# Patient Record
Sex: Female | Born: 1955 | Race: Black or African American | Hispanic: No | Marital: Single | Smoking: Current every day smoker
Health system: Southern US, Community
[De-identification: ages and names within clinical notes are randomized; demographics above are authoritative.]

## PROBLEM LIST (undated history)

## (undated) DIAGNOSIS — F419 Anxiety disorder, unspecified: Secondary | ICD-10-CM

## (undated) DIAGNOSIS — F32A Depression, unspecified: Secondary | ICD-10-CM

## (undated) DIAGNOSIS — K859 Acute pancreatitis without necrosis or infection, unspecified: Secondary | ICD-10-CM

## (undated) DIAGNOSIS — M545 Low back pain, unspecified: Secondary | ICD-10-CM

## (undated) DIAGNOSIS — IMO0002 Reserved for concepts with insufficient information to code with codable children: Secondary | ICD-10-CM

## (undated) DIAGNOSIS — E1110 Type 2 diabetes mellitus with ketoacidosis without coma: Secondary | ICD-10-CM

## (undated) DIAGNOSIS — K760 Fatty (change of) liver, not elsewhere classified: Secondary | ICD-10-CM

## (undated) DIAGNOSIS — G8929 Other chronic pain: Secondary | ICD-10-CM

## (undated) DIAGNOSIS — E785 Hyperlipidemia, unspecified: Secondary | ICD-10-CM

## (undated) DIAGNOSIS — M109 Gout, unspecified: Secondary | ICD-10-CM

## (undated) DIAGNOSIS — F329 Major depressive disorder, single episode, unspecified: Secondary | ICD-10-CM

## (undated) DIAGNOSIS — R109 Unspecified abdominal pain: Secondary | ICD-10-CM

## (undated) DIAGNOSIS — I639 Cerebral infarction, unspecified: Secondary | ICD-10-CM

## (undated) DIAGNOSIS — N183 Chronic kidney disease, stage 3 unspecified: Secondary | ICD-10-CM

## (undated) DIAGNOSIS — M1990 Unspecified osteoarthritis, unspecified site: Secondary | ICD-10-CM

## (undated) DIAGNOSIS — Z86010 Personal history of colonic polyps: Secondary | ICD-10-CM

## (undated) DIAGNOSIS — F319 Bipolar disorder, unspecified: Secondary | ICD-10-CM

## (undated) DIAGNOSIS — I10 Essential (primary) hypertension: Secondary | ICD-10-CM

## (undated) DIAGNOSIS — E119 Type 2 diabetes mellitus without complications: Secondary | ICD-10-CM

## (undated) DIAGNOSIS — F209 Schizophrenia, unspecified: Secondary | ICD-10-CM

## (undated) DIAGNOSIS — Z8673 Personal history of transient ischemic attack (TIA), and cerebral infarction without residual deficits: Secondary | ICD-10-CM

## (undated) DIAGNOSIS — E039 Hypothyroidism, unspecified: Secondary | ICD-10-CM

## (undated) DIAGNOSIS — K2970 Gastritis, unspecified, without bleeding: Secondary | ICD-10-CM

## (undated) DIAGNOSIS — K222 Esophageal obstruction: Secondary | ICD-10-CM

## (undated) DIAGNOSIS — F1410 Cocaine abuse, uncomplicated: Secondary | ICD-10-CM

## (undated) DIAGNOSIS — J449 Chronic obstructive pulmonary disease, unspecified: Secondary | ICD-10-CM

## (undated) HISTORY — DX: Gastritis, unspecified, without bleeding: K29.70

## (undated) HISTORY — DX: Bipolar disorder, unspecified: F31.9

## (undated) HISTORY — DX: Unspecified osteoarthritis, unspecified site: M19.90

## (undated) HISTORY — DX: Personal history of colonic polyps: Z86.010

## (undated) HISTORY — DX: Esophageal obstruction: K22.2

## (undated) HISTORY — DX: Type 2 diabetes mellitus with ketoacidosis without coma: E11.10

## (undated) HISTORY — DX: Personal history of transient ischemic attack (TIA), and cerebral infarction without residual deficits: Z86.73

## (undated) HISTORY — DX: Cerebral infarction, unspecified: I63.9

## (undated) HISTORY — DX: Cocaine abuse, uncomplicated: F14.10

## (undated) HISTORY — PX: COLONOSCOPY: SHX174

## (undated) HISTORY — DX: Hyperlipidemia, unspecified: E78.5

## (undated) HISTORY — DX: Essential (primary) hypertension: I10

---

## 1988-05-28 HISTORY — PX: ABDOMINAL HYSTERECTOMY: SHX81

## 2006-06-03 LAB — HM MAMMOGRAPHY: HM Mammogram: NORMAL

## 2007-05-09 ENCOUNTER — Ambulatory Visit: Payer: Self-pay | Admitting: Internal Medicine

## 2007-05-09 LAB — CONVERTED CEMR LAB
ALT: 28 units/L (ref 0–35)
Basophils Absolute: 0 10*3/uL (ref 0.0–0.1)
CO2: 25 meq/L (ref 19–32)
Cholesterol: 236 mg/dL — ABNORMAL HIGH (ref 0–200)
Creatinine, Ser: 1.23 mg/dL — ABNORMAL HIGH (ref 0.40–1.20)
Eosinophils Absolute: 0.2 10*3/uL (ref 0.2–0.7)
Eosinophils Relative: 3 % (ref 0–5)
Glucose, Bld: 85 mg/dL (ref 70–99)
HCT: 39.7 % (ref 36.0–46.0)
Hemoglobin: 13.1 g/dL (ref 12.0–15.0)
Lymphocytes Relative: 27 % (ref 12–46)
MCHC: 33 g/dL (ref 30.0–36.0)
MCV: 88 fL (ref 78.0–100.0)
Monocytes Absolute: 1 10*3/uL (ref 0.1–1.0)
RDW: 16.7 % — ABNORMAL HIGH (ref 11.5–15.5)
Total Bilirubin: 0.3 mg/dL (ref 0.3–1.2)
Total CHOL/HDL Ratio: 3.1
Triglycerides: 159 mg/dL — ABNORMAL HIGH (ref <150)
VLDL: 32 mg/dL (ref 0–40)

## 2007-05-13 ENCOUNTER — Ambulatory Visit: Payer: Self-pay | Admitting: *Deleted

## 2007-05-23 ENCOUNTER — Emergency Department (HOSPITAL_COMMUNITY): Admission: EM | Admit: 2007-05-23 | Discharge: 2007-05-23 | Payer: Self-pay | Admitting: Emergency Medicine

## 2007-06-23 ENCOUNTER — Encounter (INDEPENDENT_AMBULATORY_CARE_PROVIDER_SITE_OTHER): Payer: Self-pay | Admitting: Family Medicine

## 2007-06-23 ENCOUNTER — Ambulatory Visit: Payer: Self-pay | Admitting: Internal Medicine

## 2007-07-01 ENCOUNTER — Inpatient Hospital Stay (HOSPITAL_COMMUNITY): Admission: EM | Admit: 2007-07-01 | Discharge: 2007-07-04 | Payer: Self-pay | Admitting: Emergency Medicine

## 2007-07-04 ENCOUNTER — Inpatient Hospital Stay (HOSPITAL_COMMUNITY): Admission: AD | Admit: 2007-07-04 | Discharge: 2007-07-09 | Payer: Self-pay | Admitting: Psychiatry

## 2007-07-04 ENCOUNTER — Ambulatory Visit: Payer: Self-pay | Admitting: Psychiatry

## 2007-08-10 ENCOUNTER — Emergency Department (HOSPITAL_COMMUNITY): Admission: EM | Admit: 2007-08-10 | Discharge: 2007-08-10 | Payer: Self-pay | Admitting: Emergency Medicine

## 2007-08-11 ENCOUNTER — Inpatient Hospital Stay (HOSPITAL_COMMUNITY): Admission: AD | Admit: 2007-08-11 | Discharge: 2007-08-13 | Payer: Self-pay | Admitting: Psychiatry

## 2007-08-11 ENCOUNTER — Ambulatory Visit: Payer: Self-pay | Admitting: Psychiatry

## 2007-08-22 ENCOUNTER — Ambulatory Visit: Payer: Self-pay | Admitting: Family Medicine

## 2007-08-22 ENCOUNTER — Encounter (INDEPENDENT_AMBULATORY_CARE_PROVIDER_SITE_OTHER): Payer: Self-pay | Admitting: Internal Medicine

## 2007-08-22 LAB — CONVERTED CEMR LAB
CO2: 25 meq/L (ref 19–32)
Cholesterol: 142 mg/dL (ref 0–200)
Creatinine, Ser: 0.93 mg/dL (ref 0.40–1.20)
Glucose, Bld: 68 mg/dL — ABNORMAL LOW (ref 70–99)
HDL: 49 mg/dL (ref >39)
Total Bilirubin: 0.3 mg/dL (ref 0.3–1.2)
Total CHOL/HDL Ratio: 2.9
Triglycerides: 151 mg/dL — ABNORMAL HIGH (ref <150)
VLDL: 30 mg/dL (ref 0–40)

## 2007-09-09 ENCOUNTER — Ambulatory Visit (HOSPITAL_COMMUNITY): Admission: RE | Admit: 2007-09-09 | Discharge: 2007-09-09 | Payer: Self-pay | Admitting: Family Medicine

## 2007-09-11 ENCOUNTER — Ambulatory Visit: Payer: Self-pay | Admitting: Internal Medicine

## 2007-09-22 ENCOUNTER — Ambulatory Visit: Payer: Self-pay | Admitting: Internal Medicine

## 2007-09-29 ENCOUNTER — Ambulatory Visit: Payer: Self-pay | Admitting: Internal Medicine

## 2007-12-25 ENCOUNTER — Ambulatory Visit: Payer: Self-pay | Admitting: Internal Medicine

## 2008-01-26 ENCOUNTER — Ambulatory Visit: Payer: Self-pay | Admitting: Internal Medicine

## 2008-02-04 ENCOUNTER — Ambulatory Visit: Payer: Self-pay | Admitting: Internal Medicine

## 2008-02-04 ENCOUNTER — Encounter: Payer: Self-pay | Admitting: Family Medicine

## 2008-02-04 LAB — CONVERTED CEMR LAB
AST: 36 units/L (ref 0–37)
Alkaline Phosphatase: 83 units/L (ref 39–117)
BUN: 21 mg/dL (ref 6–23)
Calcium: 9.9 mg/dL (ref 8.4–10.5)
Creatinine, Ser: 1.62 mg/dL — ABNORMAL HIGH (ref 0.40–1.20)
HDL: 62 mg/dL (ref >39)
LDL Cholesterol: 57 mg/dL (ref 0–99)
TSH: 45.567 microintl units/mL — ABNORMAL HIGH (ref 0.350–4.50)
Total CHOL/HDL Ratio: 2.4

## 2008-03-01 ENCOUNTER — Ambulatory Visit: Payer: Self-pay | Admitting: Internal Medicine

## 2008-03-05 ENCOUNTER — Encounter: Payer: Self-pay | Admitting: Family Medicine

## 2008-03-05 ENCOUNTER — Ambulatory Visit: Payer: Self-pay | Admitting: Internal Medicine

## 2008-03-05 LAB — CONVERTED CEMR LAB
ALT: 17 units/L (ref 0–35)
CO2: 24 meq/L (ref 19–32)
Creatinine, Ser: 1.23 mg/dL — ABNORMAL HIGH (ref 0.40–1.20)
TSH: 23.667 microintl units/mL — ABNORMAL HIGH (ref 0.350–4.50)
Total Bilirubin: 0.4 mg/dL (ref 0.3–1.2)

## 2008-04-09 ENCOUNTER — Ambulatory Visit: Payer: Self-pay | Admitting: Family Medicine

## 2008-05-05 ENCOUNTER — Emergency Department (HOSPITAL_COMMUNITY): Admission: EM | Admit: 2008-05-05 | Discharge: 2008-05-05 | Payer: Self-pay | Admitting: Emergency Medicine

## 2008-05-06 ENCOUNTER — Emergency Department (HOSPITAL_COMMUNITY): Admission: EM | Admit: 2008-05-06 | Discharge: 2008-05-06 | Payer: Self-pay | Admitting: Emergency Medicine

## 2008-05-14 ENCOUNTER — Ambulatory Visit: Payer: Self-pay | Admitting: Internal Medicine

## 2008-06-22 ENCOUNTER — Emergency Department (HOSPITAL_COMMUNITY): Admission: EM | Admit: 2008-06-22 | Discharge: 2008-06-22 | Payer: Self-pay | Admitting: Emergency Medicine

## 2008-07-06 ENCOUNTER — Emergency Department (HOSPITAL_COMMUNITY): Admission: EM | Admit: 2008-07-06 | Discharge: 2008-07-06 | Payer: Self-pay | Admitting: Emergency Medicine

## 2008-08-25 ENCOUNTER — Emergency Department (HOSPITAL_COMMUNITY): Admission: EM | Admit: 2008-08-25 | Discharge: 2008-08-25 | Payer: Self-pay | Admitting: Emergency Medicine

## 2008-08-25 ENCOUNTER — Inpatient Hospital Stay (HOSPITAL_COMMUNITY): Admission: EM | Admit: 2008-08-25 | Discharge: 2008-08-28 | Payer: Self-pay | Admitting: Emergency Medicine

## 2008-08-26 ENCOUNTER — Encounter (INDEPENDENT_AMBULATORY_CARE_PROVIDER_SITE_OTHER): Payer: Self-pay | Admitting: Internal Medicine

## 2008-09-03 ENCOUNTER — Ambulatory Visit: Payer: Self-pay | Admitting: Cardiology

## 2008-09-03 ENCOUNTER — Inpatient Hospital Stay (HOSPITAL_COMMUNITY): Admission: EM | Admit: 2008-09-03 | Discharge: 2008-09-06 | Payer: Self-pay | Admitting: Emergency Medicine

## 2009-01-10 ENCOUNTER — Ambulatory Visit: Payer: Self-pay | Admitting: Family Medicine

## 2009-01-10 ENCOUNTER — Encounter: Payer: Self-pay | Admitting: Family Medicine

## 2009-01-11 ENCOUNTER — Ambulatory Visit (HOSPITAL_COMMUNITY): Admission: RE | Admit: 2009-01-11 | Discharge: 2009-01-11 | Payer: Self-pay | Admitting: Family Medicine

## 2009-03-11 ENCOUNTER — Emergency Department (HOSPITAL_COMMUNITY): Admission: EM | Admit: 2009-03-11 | Discharge: 2009-03-11 | Payer: Self-pay | Admitting: Emergency Medicine

## 2009-07-21 ENCOUNTER — Ambulatory Visit: Payer: Self-pay | Admitting: Internal Medicine

## 2009-08-01 ENCOUNTER — Emergency Department (HOSPITAL_COMMUNITY): Admission: EM | Admit: 2009-08-01 | Discharge: 2009-08-01 | Payer: Self-pay | Admitting: Emergency Medicine

## 2009-08-08 ENCOUNTER — Emergency Department (HOSPITAL_COMMUNITY): Admission: EM | Admit: 2009-08-08 | Discharge: 2009-08-08 | Payer: Self-pay | Admitting: Emergency Medicine

## 2009-09-27 ENCOUNTER — Ambulatory Visit: Payer: Self-pay | Admitting: Internal Medicine

## 2009-09-27 LAB — CONVERTED CEMR LAB
ALT: 17 units/L (ref 0–35)
Albumin: 4.9 g/dL (ref 3.5–5.2)
CO2: 22 meq/L (ref 19–32)
CRP: 0.3 mg/dL (ref <0.6)
Chloride: 101 meq/L (ref 96–112)
Cholesterol: 246 mg/dL — ABNORMAL HIGH (ref 0–200)
Glucose, Bld: 104 mg/dL — ABNORMAL HIGH (ref 70–99)
Hemoglobin: 13.2 g/dL (ref 12.0–15.0)
Hgb A1c MFr Bld: 6.5 % — ABNORMAL HIGH (ref <5.7)
Lymphocytes Relative: 31 % (ref 12–46)
Lymphs Abs: 1.7 10*3/uL (ref 0.7–4.0)
MCHC: 32.8 g/dL (ref 30.0–36.0)
Monocytes Absolute: 0.5 10*3/uL (ref 0.1–1.0)
Monocytes Relative: 9 % (ref 3–12)
Neutro Abs: 3.2 10*3/uL (ref 1.7–7.7)
Potassium: 3.6 meq/L (ref 3.5–5.3)
Pro B Natriuretic peptide (BNP): 22 pg/mL (ref 0.0–100.0)
RBC: 4.5 M/uL (ref 3.87–5.11)
Sodium: 139 meq/L (ref 135–145)
Total Protein: 7.8 g/dL (ref 6.0–8.3)
Triglycerides: 160 mg/dL — ABNORMAL HIGH (ref <150)
Valproic Acid Lvl: <1 ug/mL — ABNORMAL LOW (ref 50.0–100.0)
WBC: 5.5 10*3/uL (ref 4.0–10.5)

## 2010-01-25 ENCOUNTER — Ambulatory Visit: Payer: Self-pay | Admitting: Internal Medicine

## 2010-04-26 ENCOUNTER — Emergency Department (HOSPITAL_COMMUNITY)
Admission: EM | Admit: 2010-04-26 | Discharge: 2010-04-26 | Payer: Self-pay | Source: Home / Self Care | Admitting: Family Medicine

## 2010-05-05 ENCOUNTER — Emergency Department (HOSPITAL_COMMUNITY)
Admission: EM | Admit: 2010-05-05 | Discharge: 2010-05-05 | Payer: Self-pay | Source: Home / Self Care | Admitting: Emergency Medicine

## 2010-05-09 ENCOUNTER — Emergency Department (HOSPITAL_COMMUNITY)
Admission: EM | Admit: 2010-05-09 | Discharge: 2010-05-09 | Payer: Self-pay | Source: Home / Self Care | Admitting: Emergency Medicine

## 2010-05-10 ENCOUNTER — Emergency Department (HOSPITAL_COMMUNITY)
Admission: EM | Admit: 2010-05-10 | Discharge: 2010-05-10 | Payer: Self-pay | Source: Home / Self Care | Admitting: Emergency Medicine

## 2010-05-11 ENCOUNTER — Emergency Department (HOSPITAL_COMMUNITY)
Admission: EM | Admit: 2010-05-11 | Discharge: 2010-05-11 | Payer: Self-pay | Source: Home / Self Care | Admitting: Emergency Medicine

## 2010-05-11 ENCOUNTER — Ambulatory Visit: Payer: Self-pay | Admitting: Family Medicine

## 2010-05-11 ENCOUNTER — Emergency Department (HOSPITAL_COMMUNITY): Admission: EM | Admit: 2010-05-11 | Payer: Self-pay | Source: Home / Self Care | Admitting: Emergency Medicine

## 2010-05-26 ENCOUNTER — Emergency Department (HOSPITAL_COMMUNITY)
Admission: EM | Admit: 2010-05-26 | Discharge: 2010-05-26 | Payer: Self-pay | Source: Home / Self Care | Admitting: Family Medicine

## 2010-07-07 ENCOUNTER — Emergency Department (HOSPITAL_COMMUNITY)
Admission: EM | Admit: 2010-07-07 | Discharge: 2010-07-07 | Disposition: A | Discharge status: Home / Self Care | Payer: Self-pay | Attending: Emergency Medicine | Admitting: Emergency Medicine

## 2010-07-07 ENCOUNTER — Emergency Department (HOSPITAL_COMMUNITY): Payer: Self-pay

## 2010-07-07 DIAGNOSIS — M542 Cervicalgia: Secondary | ICD-10-CM | POA: Insufficient documentation

## 2010-07-07 DIAGNOSIS — M545 Low back pain, unspecified: Secondary | ICD-10-CM | POA: Insufficient documentation

## 2010-07-07 DIAGNOSIS — Y92009 Unspecified place in unspecified non-institutional (private) residence as the place of occurrence of the external cause: Secondary | ICD-10-CM | POA: Insufficient documentation

## 2010-07-07 DIAGNOSIS — E119 Type 2 diabetes mellitus without complications: Secondary | ICD-10-CM | POA: Insufficient documentation

## 2010-07-07 DIAGNOSIS — W108XXA Fall (on) (from) other stairs and steps, initial encounter: Secondary | ICD-10-CM | POA: Insufficient documentation

## 2010-07-07 DIAGNOSIS — E7800 Pure hypercholesterolemia, unspecified: Secondary | ICD-10-CM | POA: Insufficient documentation

## 2010-07-07 DIAGNOSIS — E039 Hypothyroidism, unspecified: Secondary | ICD-10-CM | POA: Insufficient documentation

## 2010-07-19 ENCOUNTER — Emergency Department (HOSPITAL_COMMUNITY): Payer: Self-pay

## 2010-07-19 ENCOUNTER — Emergency Department (HOSPITAL_COMMUNITY)
Admission: EM | Admit: 2010-07-19 | Discharge: 2010-07-19 | Disposition: A | Discharge status: Home / Self Care | Payer: Self-pay | Attending: Emergency Medicine | Admitting: Emergency Medicine

## 2010-07-19 DIAGNOSIS — I10 Essential (primary) hypertension: Secondary | ICD-10-CM | POA: Insufficient documentation

## 2010-07-19 DIAGNOSIS — M549 Dorsalgia, unspecified: Secondary | ICD-10-CM | POA: Insufficient documentation

## 2010-07-19 DIAGNOSIS — E119 Type 2 diabetes mellitus without complications: Secondary | ICD-10-CM | POA: Insufficient documentation

## 2010-07-19 DIAGNOSIS — E7800 Pure hypercholesterolemia, unspecified: Secondary | ICD-10-CM | POA: Insufficient documentation

## 2010-07-19 DIAGNOSIS — R12 Heartburn: Secondary | ICD-10-CM | POA: Insufficient documentation

## 2010-07-19 DIAGNOSIS — E039 Hypothyroidism, unspecified: Secondary | ICD-10-CM | POA: Insufficient documentation

## 2010-07-19 DIAGNOSIS — R112 Nausea with vomiting, unspecified: Secondary | ICD-10-CM | POA: Insufficient documentation

## 2010-07-19 DIAGNOSIS — R1013 Epigastric pain: Secondary | ICD-10-CM | POA: Insufficient documentation

## 2010-07-19 LAB — DIFFERENTIAL
Basophils Relative: 0 % (ref 0–1)
Lymphs Abs: 2.7 10*3/uL (ref 0.7–4.0)
Monocytes Absolute: 0.6 10*3/uL (ref 0.1–1.0)
Monocytes Relative: 8 % (ref 3–12)
Neutro Abs: 4 10*3/uL (ref 1.7–7.7)
Neutrophils Relative %: 54 % (ref 43–77)

## 2010-07-19 LAB — CBC
HCT: 37.6 % (ref 36.0–46.0)
Hemoglobin: 12.8 g/dL (ref 12.0–15.0)
MCH: 28.9 pg (ref 26.0–34.0)
MCHC: 34 g/dL (ref 30.0–36.0)
MCV: 84.9 fL (ref 78.0–100.0)
RBC: 4.43 MIL/uL (ref 3.87–5.11)

## 2010-07-19 LAB — URINALYSIS, ROUTINE W REFLEX MICROSCOPIC
Bilirubin Urine: NEGATIVE
Hgb urine dipstick: NEGATIVE
Specific Gravity, Urine: 1.023 (ref 1.005–1.030)
Urine Glucose, Fasting: NEGATIVE mg/dL
Urobilinogen, UA: 1 mg/dL (ref 0.0–1.0)

## 2010-07-19 LAB — COMPREHENSIVE METABOLIC PANEL
AST: 36 U/L (ref 0–37)
BUN: 17 mg/dL (ref 6–23)
CO2: 25 mEq/L (ref 19–32)
Calcium: 9.2 mg/dL (ref 8.4–10.5)
Chloride: 105 mEq/L (ref 96–112)
Creatinine, Ser: 1.55 mg/dL — ABNORMAL HIGH (ref 0.4–1.2)
GFR calc Af Amer: 42 mL/min — ABNORMAL LOW (ref >60)
GFR calc non Af Amer: 35 mL/min — ABNORMAL LOW (ref >60)
Glucose, Bld: 91 mg/dL (ref 70–99)
Total Bilirubin: 0.3 mg/dL (ref 0.3–1.2)

## 2010-07-19 LAB — LIPASE, BLOOD: Lipase: 119 U/L — ABNORMAL HIGH (ref 11–59)

## 2010-07-24 ENCOUNTER — Emergency Department (HOSPITAL_COMMUNITY)
Admission: EM | Admit: 2010-07-24 | Discharge: 2010-07-24 | Disposition: A | Discharge status: Home / Self Care | Payer: Self-pay | Attending: Emergency Medicine | Admitting: Emergency Medicine

## 2010-07-24 DIAGNOSIS — E039 Hypothyroidism, unspecified: Secondary | ICD-10-CM | POA: Insufficient documentation

## 2010-07-24 DIAGNOSIS — I10 Essential (primary) hypertension: Secondary | ICD-10-CM | POA: Insufficient documentation

## 2010-07-24 DIAGNOSIS — F319 Bipolar disorder, unspecified: Secondary | ICD-10-CM | POA: Insufficient documentation

## 2010-07-24 DIAGNOSIS — R1013 Epigastric pain: Secondary | ICD-10-CM | POA: Insufficient documentation

## 2010-07-24 DIAGNOSIS — K219 Gastro-esophageal reflux disease without esophagitis: Secondary | ICD-10-CM | POA: Insufficient documentation

## 2010-07-24 DIAGNOSIS — Z79899 Other long term (current) drug therapy: Secondary | ICD-10-CM | POA: Insufficient documentation

## 2010-07-24 DIAGNOSIS — E119 Type 2 diabetes mellitus without complications: Secondary | ICD-10-CM | POA: Insufficient documentation

## 2010-07-24 DIAGNOSIS — E789 Disorder of lipoprotein metabolism, unspecified: Secondary | ICD-10-CM | POA: Insufficient documentation

## 2010-07-24 LAB — CBC
Hemoglobin: 14.1 g/dL (ref 12.0–15.0)
MCH: 29.5 pg (ref 26.0–34.0)
Platelets: 308 10*3/uL (ref 150–400)
RBC: 4.78 MIL/uL (ref 3.87–5.11)
WBC: 7.7 10*3/uL (ref 4.0–10.5)

## 2010-07-24 LAB — COMPREHENSIVE METABOLIC PANEL
ALT: 12 U/L (ref 0–35)
AST: 26 U/L (ref 0–37)
Albumin: 4.6 g/dL (ref 3.5–5.2)
CO2: 27 mEq/L (ref 19–32)
Chloride: 100 mEq/L (ref 96–112)
Creatinine, Ser: 1.6 mg/dL — ABNORMAL HIGH (ref 0.4–1.2)
GFR calc Af Amer: 41 mL/min — ABNORMAL LOW (ref >60)
GFR calc non Af Amer: 34 mL/min — ABNORMAL LOW (ref >60)
Sodium: 135 mEq/L (ref 135–145)
Total Bilirubin: 0.7 mg/dL (ref 0.3–1.2)

## 2010-08-14 ENCOUNTER — Emergency Department (HOSPITAL_COMMUNITY): Payer: Self-pay

## 2010-08-14 ENCOUNTER — Emergency Department (HOSPITAL_COMMUNITY)
Admission: EM | Admit: 2010-08-14 | Discharge: 2010-08-14 | Disposition: A | Discharge status: Home / Self Care | Payer: Self-pay | Attending: Emergency Medicine | Admitting: Emergency Medicine

## 2010-08-14 DIAGNOSIS — J45909 Unspecified asthma, uncomplicated: Secondary | ICD-10-CM | POA: Insufficient documentation

## 2010-08-14 DIAGNOSIS — IMO0002 Reserved for concepts with insufficient information to code with codable children: Secondary | ICD-10-CM | POA: Insufficient documentation

## 2010-08-14 DIAGNOSIS — R0602 Shortness of breath: Secondary | ICD-10-CM | POA: Insufficient documentation

## 2010-08-14 DIAGNOSIS — R0989 Other specified symptoms and signs involving the circulatory and respiratory systems: Secondary | ICD-10-CM | POA: Insufficient documentation

## 2010-08-14 DIAGNOSIS — E039 Hypothyroidism, unspecified: Secondary | ICD-10-CM | POA: Insufficient documentation

## 2010-08-14 DIAGNOSIS — R0609 Other forms of dyspnea: Secondary | ICD-10-CM | POA: Insufficient documentation

## 2010-08-14 DIAGNOSIS — I10 Essential (primary) hypertension: Secondary | ICD-10-CM | POA: Insufficient documentation

## 2010-08-14 DIAGNOSIS — F319 Bipolar disorder, unspecified: Secondary | ICD-10-CM | POA: Insufficient documentation

## 2010-08-14 DIAGNOSIS — E119 Type 2 diabetes mellitus without complications: Secondary | ICD-10-CM | POA: Insufficient documentation

## 2010-08-14 DIAGNOSIS — M545 Low back pain, unspecified: Secondary | ICD-10-CM | POA: Insufficient documentation

## 2010-08-14 DIAGNOSIS — Z79899 Other long term (current) drug therapy: Secondary | ICD-10-CM | POA: Insufficient documentation

## 2010-08-14 DIAGNOSIS — G8929 Other chronic pain: Secondary | ICD-10-CM | POA: Insufficient documentation

## 2010-08-14 DIAGNOSIS — K219 Gastro-esophageal reflux disease without esophagitis: Secondary | ICD-10-CM | POA: Insufficient documentation

## 2010-08-20 LAB — CBC
HCT: 38.9 % (ref 36.0–46.0)
MCHC: 32.6 g/dL (ref 30.0–36.0)
MCV: 90.9 fL (ref 78.0–100.0)
Platelets: 252 10*3/uL (ref 150–400)
RBC: 4.28 MIL/uL (ref 3.87–5.11)
WBC: 7.7 10*3/uL (ref 4.0–10.5)

## 2010-08-30 ENCOUNTER — Ambulatory Visit (HOSPITAL_COMMUNITY)
Admission: RE | Admit: 2010-08-30 | Discharge: 2010-08-30 | Disposition: A | Discharge status: Home / Self Care | Payer: Self-pay | Source: Ambulatory Visit | Attending: Family Medicine | Admitting: Family Medicine

## 2010-08-30 ENCOUNTER — Emergency Department (HOSPITAL_COMMUNITY)
Admission: EM | Admit: 2010-08-30 | Discharge: 2010-08-30 | Disposition: A | Discharge status: Home / Self Care | Payer: Self-pay | Attending: Emergency Medicine | Admitting: Emergency Medicine

## 2010-08-30 DIAGNOSIS — M549 Dorsalgia, unspecified: Secondary | ICD-10-CM | POA: Insufficient documentation

## 2010-08-30 DIAGNOSIS — R0602 Shortness of breath: Secondary | ICD-10-CM | POA: Insufficient documentation

## 2010-08-30 DIAGNOSIS — E7800 Pure hypercholesterolemia, unspecified: Secondary | ICD-10-CM | POA: Insufficient documentation

## 2010-08-30 DIAGNOSIS — F1410 Cocaine abuse, uncomplicated: Secondary | ICD-10-CM | POA: Insufficient documentation

## 2010-08-30 DIAGNOSIS — F319 Bipolar disorder, unspecified: Secondary | ICD-10-CM | POA: Insufficient documentation

## 2010-08-30 DIAGNOSIS — R1013 Epigastric pain: Secondary | ICD-10-CM | POA: Insufficient documentation

## 2010-08-30 DIAGNOSIS — J45909 Unspecified asthma, uncomplicated: Secondary | ICD-10-CM | POA: Insufficient documentation

## 2010-08-30 DIAGNOSIS — E039 Hypothyroidism, unspecified: Secondary | ICD-10-CM | POA: Insufficient documentation

## 2010-08-30 DIAGNOSIS — K219 Gastro-esophageal reflux disease without esophagitis: Secondary | ICD-10-CM | POA: Insufficient documentation

## 2010-08-30 DIAGNOSIS — S01119A Laceration without foreign body of unspecified eyelid and periocular area, initial encounter: Secondary | ICD-10-CM | POA: Insufficient documentation

## 2010-08-30 DIAGNOSIS — I10 Essential (primary) hypertension: Secondary | ICD-10-CM | POA: Insufficient documentation

## 2010-08-30 DIAGNOSIS — E119 Type 2 diabetes mellitus without complications: Secondary | ICD-10-CM | POA: Insufficient documentation

## 2010-08-30 LAB — RAPID URINE DRUG SCREEN, HOSP PERFORMED
Barbiturates: NOT DETECTED
Benzodiazepines: POSITIVE — AB
Opiates: POSITIVE — AB

## 2010-08-30 LAB — URINALYSIS, ROUTINE W REFLEX MICROSCOPIC
Glucose, UA: NEGATIVE mg/dL
Protein, ur: NEGATIVE mg/dL
Specific Gravity, Urine: 1.031 — ABNORMAL HIGH (ref 1.005–1.030)
pH: 6 (ref 5.0–8.0)

## 2010-08-30 LAB — DIFFERENTIAL
Basophils Relative: 0 % (ref 0–1)
Eosinophils Absolute: 0.1 10*3/uL (ref 0.0–0.7)
Eosinophils Relative: 1 % (ref 0–5)
Lymphs Abs: 2.1 10*3/uL (ref 0.7–4.0)
Monocytes Relative: 7 % (ref 3–12)

## 2010-08-30 LAB — COMPREHENSIVE METABOLIC PANEL
Albumin: 4.1 g/dL (ref 3.5–5.2)
BUN: 12 mg/dL (ref 6–23)
Calcium: 9.2 mg/dL (ref 8.4–10.5)
Creatinine, Ser: 1.47 mg/dL — ABNORMAL HIGH (ref 0.4–1.2)
GFR calc Af Amer: 45 mL/min — ABNORMAL LOW (ref >60)
Total Bilirubin: 0.3 mg/dL (ref 0.3–1.2)
Total Protein: 7.2 g/dL (ref 6.0–8.3)

## 2010-08-30 LAB — CBC
MCH: 29.1 pg (ref 26.0–34.0)
MCHC: 34.1 g/dL (ref 30.0–36.0)
MCV: 85.3 fL (ref 78.0–100.0)
Platelets: 272 10*3/uL (ref 150–400)
RDW: 16.6 % — ABNORMAL HIGH (ref 11.5–15.5)

## 2010-09-06 LAB — CBC
HCT: 40.3 % (ref 36.0–46.0)
Hemoglobin: 13.5 g/dL (ref 12.0–15.0)
MCHC: 33.8 g/dL (ref 30.0–36.0)
MCV: 88.5 fL (ref 78.0–100.0)
MCV: 89.2 fL (ref 78.0–100.0)
Platelets: 239 10*3/uL (ref 150–400)
RBC: 3.94 MIL/uL (ref 3.87–5.11)
RBC: 4.1 MIL/uL (ref 3.87–5.11)
RBC: 4.53 MIL/uL (ref 3.87–5.11)
RDW: 19.5 % — ABNORMAL HIGH (ref 11.5–15.5)
RDW: 19.8 % — ABNORMAL HIGH (ref 11.5–15.5)
WBC: 5.7 10*3/uL (ref 4.0–10.5)
WBC: 7.9 10*3/uL (ref 4.0–10.5)
WBC: 8.2 10*3/uL (ref 4.0–10.5)

## 2010-09-06 LAB — RAPID URINE DRUG SCREEN, HOSP PERFORMED
Amphetamines: NOT DETECTED
Amphetamines: NOT DETECTED
Benzodiazepines: NOT DETECTED
Cocaine: POSITIVE — AB
Cocaine: POSITIVE — AB
Opiates: POSITIVE — AB
Tetrahydrocannabinol: NOT DETECTED
Tetrahydrocannabinol: NOT DETECTED

## 2010-09-06 LAB — HEPATIC FUNCTION PANEL
AST: 31 U/L (ref 0–37)
Albumin: 3.5 g/dL (ref 3.5–5.2)
Alkaline Phosphatase: 112 U/L (ref 39–117)
Bilirubin, Direct: 0.2 mg/dL (ref 0.0–0.3)
Total Bilirubin: 0.5 mg/dL (ref 0.3–1.2)

## 2010-09-06 LAB — URINALYSIS, ROUTINE W REFLEX MICROSCOPIC
Glucose, UA: NEGATIVE mg/dL
Ketones, ur: NEGATIVE mg/dL
Leukocytes, UA: NEGATIVE
Leukocytes, UA: NEGATIVE
Nitrite: NEGATIVE
Nitrite: NEGATIVE
Protein, ur: 30 mg/dL — AB
Protein, ur: 30 mg/dL — AB
Urobilinogen, UA: 1 mg/dL (ref 0.0–1.0)
pH: 6.5 (ref 5.0–8.0)

## 2010-09-06 LAB — TROPONIN I
Troponin I: 0.01 ng/mL (ref 0.00–0.06)
Troponin I: <0.01 ng/mL (ref 0.00–0.06)
Troponin I: <0.01 ng/mL (ref 0.00–0.06)
Troponin I: <0.01 ng/mL (ref 0.00–0.06)

## 2010-09-06 LAB — GLUCOSE, CAPILLARY
Glucose-Capillary: 104 mg/dL — ABNORMAL HIGH (ref 70–99)
Glucose-Capillary: 113 mg/dL — ABNORMAL HIGH (ref 70–99)
Glucose-Capillary: 119 mg/dL — ABNORMAL HIGH (ref 70–99)
Glucose-Capillary: 119 mg/dL — ABNORMAL HIGH (ref 70–99)
Glucose-Capillary: 129 mg/dL — ABNORMAL HIGH (ref 70–99)
Glucose-Capillary: 133 mg/dL — ABNORMAL HIGH (ref 70–99)
Glucose-Capillary: 145 mg/dL — ABNORMAL HIGH (ref 70–99)
Glucose-Capillary: 148 mg/dL — ABNORMAL HIGH (ref 70–99)
Glucose-Capillary: 154 mg/dL — ABNORMAL HIGH (ref 70–99)
Glucose-Capillary: 158 mg/dL — ABNORMAL HIGH (ref 70–99)
Glucose-Capillary: 163 mg/dL — ABNORMAL HIGH (ref 70–99)
Glucose-Capillary: 166 mg/dL — ABNORMAL HIGH (ref 70–99)
Glucose-Capillary: 278 mg/dL — ABNORMAL HIGH (ref 70–99)

## 2010-09-06 LAB — BASIC METABOLIC PANEL
BUN: 6 mg/dL (ref 6–23)
CO2: 24 mEq/L (ref 19–32)
CO2: 24 mEq/L (ref 19–32)
Chloride: 102 mEq/L (ref 96–112)
Chloride: 109 mEq/L (ref 96–112)
Creatinine, Ser: 1.44 mg/dL — ABNORMAL HIGH (ref 0.4–1.2)
Creatinine, Ser: 1.54 mg/dL — ABNORMAL HIGH (ref 0.4–1.2)
GFR calc Af Amer: 47 mL/min — ABNORMAL LOW (ref >60)
Glucose, Bld: 116 mg/dL — ABNORMAL HIGH (ref 70–99)
Glucose, Bld: 146 mg/dL — ABNORMAL HIGH (ref 70–99)
Potassium: 4.4 mEq/L (ref 3.5–5.1)

## 2010-09-06 LAB — URINE MICROSCOPIC-ADD ON

## 2010-09-06 LAB — DIFFERENTIAL
Eosinophils Absolute: 0.1 10*3/uL (ref 0.0–0.7)
Lymphs Abs: 2.1 10*3/uL (ref 0.7–4.0)
Neutrophils Relative %: 64 % (ref 43–77)

## 2010-09-06 LAB — CK TOTAL AND CKMB (NOT AT ARMC)
CK, MB: 5.3 ng/mL — ABNORMAL HIGH (ref 0.3–4.0)
CK, MB: 6.2 ng/mL — ABNORMAL HIGH (ref 0.3–4.0)
CK, MB: 6.7 ng/mL — ABNORMAL HIGH (ref 0.3–4.0)
Relative Index: 0.5 (ref 0.0–2.5)
Total CK: 694 U/L — ABNORMAL HIGH (ref 7–177)
Total CK: 990 U/L — ABNORMAL HIGH (ref 7–177)

## 2010-09-06 LAB — TSH: TSH: 69.845 u[IU]/mL — ABNORMAL HIGH (ref 0.350–4.500)

## 2010-09-06 LAB — POCT I-STAT, CHEM 8
Creatinine, Ser: 1.6 mg/dL — ABNORMAL HIGH (ref 0.4–1.2)
HCT: 47 % — ABNORMAL HIGH (ref 36.0–46.0)
Hemoglobin: 16 g/dL — ABNORMAL HIGH (ref 12.0–15.0)
Potassium: 3.4 mEq/L — ABNORMAL LOW (ref 3.5–5.1)
Sodium: 138 mEq/L (ref 135–145)

## 2010-09-06 LAB — POCT CARDIAC MARKERS
CKMB, poc: 2.3 ng/mL (ref 1.0–8.0)
Myoglobin, poc: 137 ng/mL (ref 12–200)

## 2010-09-06 LAB — HEMOGLOBIN A1C: Mean Plasma Glucose: 180 mg/dL

## 2010-09-06 LAB — HIV ANTIBODY (ROUTINE TESTING W REFLEX): HIV: NONREACTIVE

## 2010-09-06 LAB — CARDIAC PANEL(CRET KIN+CKTOT+MB+TROPI): Relative Index: 0.5 (ref 0.0–2.5)

## 2010-09-06 LAB — CREATININE, URINE, RANDOM: Creatinine, Urine: 364.8 mg/dL

## 2010-09-06 LAB — HEPARIN LEVEL (UNFRACTIONATED)
Heparin Unfractionated: 0.28 IU/mL — ABNORMAL LOW (ref 0.30–0.70)
Heparin Unfractionated: <0.1 IU/mL — ABNORMAL LOW (ref 0.30–0.70)

## 2010-09-07 LAB — BASIC METABOLIC PANEL
BUN: 5 mg/dL — ABNORMAL LOW (ref 6–23)
Calcium: 9.6 mg/dL (ref 8.4–10.5)
GFR calc non Af Amer: 36 mL/min — ABNORMAL LOW (ref >60)
Potassium: 2.6 mEq/L — CL (ref 3.5–5.1)
Sodium: 135 mEq/L (ref 135–145)

## 2010-09-07 LAB — CBC
HCT: 41.8 % (ref 36.0–46.0)
Hemoglobin: 14.3 g/dL (ref 12.0–15.0)
Platelets: 312 10*3/uL (ref 150–400)
WBC: 8.2 10*3/uL (ref 4.0–10.5)

## 2010-09-07 LAB — DIFFERENTIAL
Eosinophils Relative: 1 % (ref 0–5)
Lymphocytes Relative: 32 % (ref 12–46)
Lymphs Abs: 2.6 10*3/uL (ref 0.7–4.0)
Neutro Abs: 4.8 10*3/uL (ref 1.7–7.7)

## 2010-09-07 LAB — D-DIMER, QUANTITATIVE: D-Dimer, Quant: 0.24 ug/mL-FEU (ref 0.00–0.48)

## 2010-09-07 LAB — POCT CARDIAC MARKERS
CKMB, poc: 4.2 ng/mL (ref 1.0–8.0)
Myoglobin, poc: 302 ng/mL (ref 12–200)
Troponin i, poc: <0.05 ng/mL (ref 0.00–0.09)

## 2010-09-28 ENCOUNTER — Emergency Department (HOSPITAL_COMMUNITY)
Admission: EM | Admit: 2010-09-28 | Discharge: 2010-09-28 | Disposition: A | Discharge status: Home / Self Care | Payer: Self-pay | Attending: Emergency Medicine | Admitting: Emergency Medicine

## 2010-09-28 DIAGNOSIS — E7800 Pure hypercholesterolemia, unspecified: Secondary | ICD-10-CM | POA: Insufficient documentation

## 2010-09-28 DIAGNOSIS — M545 Low back pain, unspecified: Secondary | ICD-10-CM | POA: Insufficient documentation

## 2010-09-28 DIAGNOSIS — E039 Hypothyroidism, unspecified: Secondary | ICD-10-CM | POA: Insufficient documentation

## 2010-09-28 DIAGNOSIS — K219 Gastro-esophageal reflux disease without esophagitis: Secondary | ICD-10-CM | POA: Insufficient documentation

## 2010-09-28 DIAGNOSIS — I10 Essential (primary) hypertension: Secondary | ICD-10-CM | POA: Insufficient documentation

## 2010-09-28 DIAGNOSIS — Z79899 Other long term (current) drug therapy: Secondary | ICD-10-CM | POA: Insufficient documentation

## 2010-09-28 DIAGNOSIS — F319 Bipolar disorder, unspecified: Secondary | ICD-10-CM | POA: Insufficient documentation

## 2010-09-28 DIAGNOSIS — Z760 Encounter for issue of repeat prescription: Secondary | ICD-10-CM | POA: Insufficient documentation

## 2010-09-28 DIAGNOSIS — E119 Type 2 diabetes mellitus without complications: Secondary | ICD-10-CM | POA: Insufficient documentation

## 2010-09-28 DIAGNOSIS — G8929 Other chronic pain: Secondary | ICD-10-CM | POA: Insufficient documentation

## 2010-09-28 DIAGNOSIS — F17200 Nicotine dependence, unspecified, uncomplicated: Secondary | ICD-10-CM | POA: Insufficient documentation
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
## 2010-10-10 NOTE — Discharge Summary (Signed)
[name], [name]                 ACCOUNT NO.:  [id number]   MEDICAL RECORD NO.:  [id number]          PATIENT TYPE:  IPS   LOCATION:  0504                          FACILITY:  BH   PHYSICIAN:  Geoffery Lyons, M.D.      DATE OF BIRTH:  31-Oct-1955   DATE OF ADMISSION:  08/11/2007  DATE OF DISCHARGE:  08/13/2007                               DISCHARGE SUMMARY   CHIEF COMPLAINT/HISTORY OF PRESENT ILLNESS:  This was the second  admission to [facility] for this 55 year old African-  American female, voluntarily admitted.  Presented to the emergency room  reporting that she was having suicidal thoughts, wanting to overdose on  her medications.  Was evicted from her transitional house hopefully  tomorrow.  __________  out of prison, where she was for bad checks.  They evicted her because she had used cocaine and relapsed this week,  using twice.  The director of the home brought her to the emergency  room.  Feeling hopeless, having to start over again in another place.  Nowhere to go.  No family.  No support.   PAST PSYCHIATRIC HISTORY:  __________  at [facility], previously  from February 6th to the 11th after taking an intentional overdose of  __________  medications.  Cocaine dependence with a history of 4 years  of abstinence.  She had been diagnosed with bipolar disorder about 5 to  6 years ago.  Has a history of prior hospitalization at the [facility] in 2005.   MEDICAL HISTORY:  Hyperlipidemia, hypertension, asthma, hypothyroidism.   MEDICATIONS:  1. Glucotrol XL 5 mg per day.  2. Avandia 2 mg twice a day.  3. Albuterol inhaler 2 puffs every 4 hours as needed for asthma.  4. Invega 3 mg per day.  5. Lipitor 40 mg per day.  6. Trazodone 100 mg per day.  7. Depakote 1000 at night.  8. Percocet 5/325 one tablet every six hours as needed for pain.  9. Lisinopril 5 mg per day.  10.Prozac 20 mg per day.  11.Levothyroxine 100 mcg per day.  12.Hydrochlorothiazide 25 mg per day.  13.Protonix 40 mg per day.  14.Ambien 10 at bedtime for sleep.  15.Lantus insulin 20 units nightly.   PHYSICAL EXAMINATION:  Failed to show any acute findings.   LABORATORY WORK:  TSH previously 107.34.  CBC:  White blood cell 8.2,  hemoglobin 12.4, sodium 137, potassium 3.4, BUN 16, creatinine 1.2,  glucose 79, SGOT 31, SGPT 19, total bilirubin 0.4.  Depakote level 33.1.  UDS positive for cocaine.   MENTAL STATUS EXAM:  This is a fully alert cooperative female, blunted  affect, feeling very hopeless.  Upset because she was evicted from the  home because she used drugs.  She regrets her relapse.  She is now  homeless.  Was able to get to work easily living at this home.  She was  close to [facility], but now she has no way to go to work all the way to  [facility].  No active suicidal or homicidal ideations but did not  feel  safe in the community.  No delusions.  No hallucinations.  Cognition  well-preserved.   ADMITTING DIAGNOSES:  AXIS I:  Cocaine dependence.  Depressive disorder  not otherwise specified.  Rule out bipolar disorder or mood disorder  NOS.  AXIS II:  No diagnosis.  AXIS III:  Diabetes mellitus type 2.  Hypertension.  Hypothyroidism.  AXIS IV:  Moderate.  AXIS V:  On admission 35 to 40, highest GAF in the last year was 60.   COURSE IN THE HOSPITAL:  She was admitted.  She was maintained on her  medications.  She was given Prozac, which was increased to 40.  Hinda Glatter  was increased to 6.  As already stated, a 55 year old female endorsed  that after she left last time she did not go back to the house  at onset  of turmoil.  Also admits that there was some use of substances.  She did  admit that she relapsed, became suicidal.  [place] Spaniel that she was wanting to  overdose on all of her psych meds.  She continued to be very upset,  endorsing depression.  She was wanting to look into __________  house.  She was actually accepted in a halfway  house, which was very encouraging  for her, so her mood improved, her affect became brighter, and on March  18 she was in full contact with reality.  No active withdrawal.  No  suicidal or homicidal ideations.  She was excited about all the  possibilities when she was admitted to the halfway house.   DISCHARGE DIAGNOSES:  AXIS I:  Cocaine dependence.  Bipolar disorder.  Depressed.  AXIS II:  No diagnosis.  AXIS III:  Diabetes mellitus type 2.  Hypertension.  Hypothyroidism.  AXIS IV:  Moderate.  AXIS V:  On discharge 50 to 55.   Discharged on:  1. Invega 6 mg per day.  2. Depakote 500 two at night.  3. Prozac 40 mg per day.  4. Lipitor 40 mg per day.  5. Avandia 2 mg twice a day.  6. Glucotrol XL 5 mg in the morning.  7. Protonix 40 mg daily.  8. Lantus 20 units subcutaneous at bedtime.  9. Prinivil 5 mg per day.  10.Levothyroxine 100 mcg per day.  11.Trazodone 100 mg at bedtime.   Follow up with __________  [facility].      Geoffery Lyons, M.D.  Electronically Signed     IL/MEDQ  D:  09/09/2007  T:  09/09/2007  Job:  [id number]

## 2010-10-10 NOTE — H&P (Signed)
[name], [name]                 ACCOUNT NO.:  [id number]   MEDICAL RECORD NO.:  [id number]          PATIENT TYPE:  IPS   LOCATION:  0504                          FACILITY:  BH   PHYSICIAN:  Geoffery Lyons, M.D.      DATE OF BIRTH:  [birth year]   DATE OF ADMISSION:  08/11/2007  DATE OF DISCHARGE:                       PSYCHIATRIC ADMISSION ASSESSMENT   DATE OF THE ASSESSMENT:  August 11, 2007, at 11:00 a.m.   IDENTIFYING INFORMATION:  A 55 year old African-American female. She is  single.  This is a voluntary admission.   HISTORY OF PRESENT ILLNESS:  This patient presented in the emergency  room reporting that she was having suicidal thoughts, wanting to  overdose on her medications. She had been evicted from her transitional  housing [facility] where she had been staying since getting out  of prison several weeks ago for bad checks.  They evicted her because  she had used cocaine and relapsed this week, using twice. She had the  director of the home bring her to the emergency room.  She reports  feeling hopeless having to start all over again with no housing, has  nowhere to go, no family in the area, and no support.  She denies any  homicidal thought or hallucinations.   PAST PSYCHIATRIC HISTORY:  The patient is followed at [facility]. This is her second admission to [facility] with her previous admission being February 6 to 11, 2009,  after taking an intentional overdose of various medications. At that  time she had also stopped taking her psychotropic medications.  She has  a history of cocaine dependence with an episode of 4 years of abstinence  at one point. She had been diagnosed with bipolar disorder about 5-6  years ago and has a history of a prior hospitalization at [facility] in 2005.  She denies any history of alcohol use.   SOCIAL HISTORY:  Single African-American female, born and raised in [place], currently  at [employer]. Has had 1-1/2 years of college.  Previously married and divorced once. Currently separated from her  second husband.  She has three sons, ages 31, 64 and 70. No history of  IV drug use or use of marijuana.  Denies abuse of alcohol.   FAMILY HISTORY:  She denies a family history of mental illness or  substance abuse.   MEDICAL HISTORY:  Is the patient is followed by [facility].  Medical problems:  Recently diagnosed with diabetes mellitus in February  while hospitalized, and she has a history of hypothyroidism and  hypertension.   PAST MEDICAL HISTORY:  1. Bipolar disorder.  2. Hyperlipidemia.  3. Hypertension.  4. Asthma.  5. Hypothyroidism.   CURRENT MEDICATIONS:  1. Glucotrol XL 5 mg daily.  2. Avandia 2 mg b.i.d.  3. Albuterol inhaler 2 puffs q.4 h p.r.n. asthma.  4 . Invega 3 mg daily.  1. Lipitor or 40 mg daily.  2. Trazodone 100 mg nightly.  3. Depakote 1000 mg p.o. nightly.  4. Percocet 5/325 one tablet  q.6 h p.r.n. pain.  5. Lisinopril 5 mg daily.  6. Fluoxetine 20 mg daily.  7. Levothyroxine 100 mcg daily.  8. Hydrochlorothiazide 25 mg daily.  9. Protonix 40 mg daily.  10.Ambien 10 mg h.s. p.r.n. insomnia.  11.Lantus insulin 20 units subcutaneously nightly.   ALLERGIES:  PENICILLIN and CODEINE.   PHYSICAL EXAMINATION:  Physical exam was done in the emergency room as  noted in the record, a pleasant but depressed-appearing 55 year old  African-American female, 5 feet 2 inches tall, 200 pounds. Temperature  97.9, pulse 74, respirations 18, blood pressure 119/71.   DIAGNOSTIC STUDIES:  Are notable for a TSH previously of 107.34 done on  February 4.  CBC: WBC 8.2, hemoglobin 12.4, hematocrit 36.5 and  platelets 271,000.  Chemistry:  Sodium 137, potassium 3.4, chloride 101,  carbon dioxide 26, BUN 16, creatinine 1.20 and random glucose 79.  Liver  enzymes:  SGOT 31, SGPT 19, alkaline phosphatase 78 and total bilirubin  0.4.  Valproate level was 33.1 in the emergency room.  Calcium normal at  9.8.  Alcohol level less than 5.  Urine drug screen positive for  cocaine.   MENTAL STATUS EXAM:  Fully alert female, cooperative with a blunt  affect.  Verbalizes that she feels very hopeless, upset that she was  evicted from the home because she used drugs when, in fact, she felt  that there were a lot of drugs in the home, and she was not the one that  brought the drugs into the home.  Nevertheless, she regrets her relapse,  now is homeless.  No support, no family in the area.  Was able to get to  work easily well living at this home but now has no way to get to work  at [employer].  No active suicidal thoughts today. Does not feel she can be  safe in the community.  She is not sure what to do next now that she is  homeless.  Is willing to work with our Child psychotherapist to get her  replaced. Endorses taking her medications regularly. Denying any  confusion or homicidal thoughts.  Denying any agitation.  Insight is  good.  Impulse control and judgment within normal limits.  Thinking is  linear, goal directed.  She is cooperative.  No signs of psychosis.  No  active suicidal thoughts today.   AXIS I:  1. Depressive disorder not otherwise specified.  2. Cocaine abuse, rule out dependence.  AXIS II:  Deferred.  AXIS III:  1. Diabetes mellitus, type 2, uncontrolled.  2. Hypertension.  3. Hypothyroidism.  AXIS V:  Current 45, past year not known.   PLAN:  Voluntarily admit the patient with 15-minute checks in place with  a goal of alleviating her suicidality.  We are going to check a TSH on  her with her previous TSH 107.  We will continue her other routine  medications.  Are going to work towards placement.  Estimated length of  stay is 5 days.      Margaret A. Scott, N.P.      Geoffery Lyons, M.D.  Electronically Signed    MAS/MEDQ  D:  08/11/2007  T:  08/11/2007  Job:  [id number]

## 2010-10-10 NOTE — H&P (Signed)
NAMECOLLYN, SELK                 ACCOUNT NO.:  [id number]   MEDICAL RECORD NO.:  [id number]          PATIENT TYPE:  INP   LOCATION:  3733                         FACILITY:  [facility]   PHYSICIAN:  Acey Lav, MD  DATE OF BIRTH:  [birth year]   DATE OF ADMISSION:  08/25/2008  DATE OF DISCHARGE:                              HISTORY & PHYSICAL   CHIEF COMPLAINTS:  Chest pain.   HISTORY OF THE PRESENT ILLNESS:  The patient is a 55 year old African  American lady with a past medical history significant for hypertension,  hyperlipidemia, smoking, (family history positive for coronary artery  disease), diabetes mellitus, and prior crack cocaine use who presents  with the onset of substernal chest pain last night after smoking a  cigarette.  She states that after smoking the cigarette she developed  chest pain that was initially sharp that she rates about an 8/10 in  severity, and then becoming a squeezing sensation.  She felt nauseous  and vomited some clear material once.  She also complains of some  dyspnea and states that the chest pain is made worse by deep breathing.  After smoking the cigarette, she states that she asked her friend why it  had tasted funny.  Her friend stated There was just a little bit of  crack in that cigarette.  The patient states that her chest pain eased  off after about an hour down to a 5/10.  She ultimately went to bed at  midnight and slept, although she was awakened frequently throughout the  night gasping for air.  In the morning she awoke again with chest pain  that she describes as a tightness and she rates this as a 5/10 in  severity, and was made worse with walking and with deep breathing.  It  did not radiate her neck or to her arm.  It was accompanied by nausea  and she vomited once nonbilious material this morning.  She did not eat  much food as she felt nauseous throughout the day.  She came to the  emergency room in the  early after, at around  2 o'clock, but then when  her chest pain disappeared she decided to leave.  The chest pain then  returned at around 5 o'clock in the afternoon and she came back to the  emergency room.  The chest pain was initially a 5/10 again occurring  while walking, but then was still present at rest and again made worse  by deep breathing.  By time she came to the emergency department  her  chest pain had gone up to an 8/10 in severity.   In the emergency room her initial EKG showed some T-wave flattening in  her anteroseptal and lateral leads, which are not much changed from  prior EKG, and her initial cardiac markers were negative, although her  myoglobin was slightly elevated today.  D-dimer was negative.  She  received a full aspirin in the emergency room. She received  nitroglycerin with no relief of her chest pain.  She was given 4 mg of  morphine  with the chest pain easing off a little from an 8/10 to 5/10.  She states that she has had chest pain in the past with cocaine use and  had chest pain last July.  She states that she has had a stress test  before at [facility] in 2003.  She has not had a cardiac  catheterization.   PAST MEDICAL HISTORY:  1. Bipolar disorder.  2. Past history of suicide attempts.  3. Hyperlipidemia.  4. Hypertension.  5. Diabetes mellitus.  6. Asthma.  7. Hypothyroidism.  8. Osteoarthritis.   PAST SURGICAL HISTORY:  The patient has had total abdominal  hysterectomy.   CURRENT MEDICATIONS:  1. Actos 30 mg daily.  2. Aspirin 81 mg daily.  3. Calcium.  4. Depakote 1500 mg at bedtime.  5. Flexeril 10 mg three times daily.  6. Glucotrol 5 mg daily.  7. Hydrochlorothiazide 25 mg daily.  8. Lantus 20 units at bedtime.  9. Levothyroxine 100 mcg daily.  10.Lipitor 40 mg daily.  11.Lisinopril 5 mg daily.  12.Nabumetone 500 mg once a day.  13.Protonix 40 mg daily.  14.Prozac 40 mg daily.  15.Ultram acetaminophen 50 mg as needed.  16.Trazodone 100 mg at  bedtime.   ALLERGIES:  PENICILLIN WHICH CAUSES A RASH AND CODEINE WHICH CAUSES  NAUSEA.   REVIEW OF SYSTEMS:  The review of systems is as described in the history  present illness, otherwise 10-point review of systems is negative.   SOCIAL HISTORY:  The patient is living with a girlfriend.  She is  currently a smoker.  Denies smoking crack until last night by accident.  She states she went through rehab this past Summer.  She used to smoke  crack cocaine and also smoked marijuana.   FAMILY HISTORY:  The family history is positive for her father dying his  30s from coronary disease.  Her mother died in her late 40s from  coronary disease.   PHYSICAL EXAMINATION:  VITAL SIGNS:  Blood pressure initially in the  emergency room 166/105 and later 146/85, pulse 90, respirations 20,  pulse ox is 97% on 2 liters via nasal cannula, and temperature maximum  97.9.  GENERAL APPEARANCE:  In general this is a quite pleasant lady, slightly  dysphoric.  HEENT:  Normocephalic and atraumatic head.  Pupils are equal, round and  react to light.  Sclerae anicteric.  Oropharynx clear.  NECK:  The neck is supple.  HEART:  Cardiovascular exam - regular rate and rhythm without murmurs,  gallops or rubs.  LUNGS:  The lungs are clear to auscultation bilaterally without wheezes,  rhonchi or rales.  ABDOMEN:  The abdomen is soft, nondistended and nontender.  EXTREMITIES:  The extremities reveal trace pretibial edema.   LABORATORY DATA:  EKG; normal sinus rhythm.  She has some T-wave  flattening in V1, V2, V3, V4, V5, and V6.  The T-wave flattening is new  in V1 and V2 compared to an EKG in February 2009.  She also has some  flattening in her inferior leads and lateral leads, which is old.  D-  dimer 0.24.  Metabolic panel is significant for a sodium of 135,  potassium 2.6, chloride 99, bicarb 25, BUN 5, creatinine 1.5, glucose  153, and calcium 9.6.  CBC with differential; white count 8.2,  hemoglobin  14.3, platelets 312,000, and ANC of 4.8.  Cardiac markers;  CK/MB 4.2, troponin 0.05 and myoglobin 302.   ASSESSMENT/PLAN:  This is a 55 year old [place] American lady with  multiple risk factors for coronary disease including a positive family  history of early coronary artery disease, with a positive personal  history for hypertension, hyperlipidemia, diabetes mellitus and smoking  who has had past chest pain with crack cocaine ingestion, and now  currently also with chest pain after smoking crack last night.   1. Unstable angina secondary in setting of cocaine abuse.  I am going      to give the patient 10 mg of Norvasc.  I will give her 40 mg of      Lipitor.  She has already received a full dose aspirin.  She has      received 4 mg of morphine.  If the morphine still has not relieved      her pain I am going to give her a dose of Dilaudid to try and make      sure she is chest pain-free.  I am going to start her on      intravenous heparin.  I will avoid beta-blockers in this patient      due to the fact she has just taken cocaine.  I will try to correct      her hypokalemia.  I have talked to Dr. Gala Romney from [facility]      Cardiology and they will see the patient in the morning.  Certainly      if the patient's biomarkers return positive or she still has chest      pain she will need to be seen by Cardiology tonight  2. Diabetes mellitus.  I will continue her on Lantus, but at a      slightly lower dose and sliding-scale insulin.  I will hold her      oral agents.  3. Renal failure: Likely prerenal. Will check urine lytes, give her      bolus NS  4. Hypertension.  I am holding a thiazide diuretic.  I will give her      on Norvasc and I am holding her angiotensin converting enzyme      inhibitor.  5. Bipolar disorder.  I will continue her Depakote.  I will continue      her Prozac.  6. Hypothyroidism.  I will continue her on Levothyroxine.  I will      check her thyroid  stimulating hormone in the morning.  7. Health screening.  I will screen her for human immunodeficiency      virus; no separate consent is necessary for this.  8. Prophylaxes.  The patient is fully anticoagulated with heparin.  9. Code Status.  The patient is a Full code.      Acey Lav, MD  Electronically Signed     CV/MEDQ  D:  08/26/2008  T:  08/26/2008  Job:  [id number]   cc:   Eliezer Bottom. Bensimhon, MD

## 2010-10-10 NOTE — Consult Note (Signed)
NAMEFALAN, HENSLER                 ACCOUNT NO.:  [id number]   MEDICAL RECORD NO.:  [id number]          PATIENT TYPE:  INP   LOCATION:  1513                         FACILITY:  [facility]   PHYSICIAN:  Antonietta Breach, M.D.  DATE OF BIRTH:  06-Mar-1956   DATE OF CONSULTATION:  07/02/2007  DATE OF DISCHARGE:  07/04/2007                                 CONSULTATION   REASON FOR CONSULTATION:  Suicide attempt.   REQUESTING PHYSICIAN:  [facility].   HISTORY:  [name] is a 55 year old female admitted to the  [facility] on February 3 after a suicide attempt with an  overdose.  She lists a number of stresses including finances and her  living environment.   She also describes a history of 3-day periods of increased energy,  decreased sleep, but most recently she has had depressed mood and  suicidal thoughts.  This overdose was a suicide attempt.   PAST PSYCHIATRIC HISTORY:  The patient does have a history of depression  as well as the heightened energy periods mentioned above.  She has been  treated with Prozac, Risperdal, Depakote.   She also has a history of a prior suicide attempt in 2005.   MENTAL STATUS EXAM:  [name] is alert.  She is oriented to all  spheres.  Thought process logical, coherent, goal-directed.  No  looseness of associations.  Thought content.  She has suicidal intent.  She has no delusions or hallucinations. Affect constricted.  Mood  depressed.  Insight poor judgment, is intact for need of treatment.   ASSESSMENT:  AXIS I:  293.83-Mood disorder not otherwise specified; rule  out bipolar disorder not otherwise specified, depressed.  AXIS II:  Deferred.  AXIS III:  Status post overdose.  AXIS IV:  Economic primary support group.  AXIS V:  30.   [name] is still at risk to harm herself.   Recommend admission to a psychiatric unit once medically cleared.      Antonietta Breach, M.D.  Electronically Signed     JW/MEDQ  D:   10/16/2007  T:  10/16/2007  Job:  [id number]
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
## 2010-10-10 NOTE — Discharge Summary (Signed)
[name], BRAFFORD                 ACCOUNT NO.:  [id number]   MEDICAL RECORD NO.:  [id number]          PATIENT TYPE:  INP   LOCATION:  5505                         FACILITY:  [facility]   PHYSICIAN:  Eduard Clos, MDDATE OF BIRTH:  05-29-1955   DATE OF ADMISSION:  08/25/2008  DATE OF DISCHARGE:  08/28/2008                               DISCHARGE SUMMARY   HOSPITAL COURSE:  A 55 year old female with known history of  hypertension, hyperlipidemia, diabetes mellitus type 2, hypothyroidism  presented to the ER with complaints of chest pain.  On admission the  patient was found to be positive for cocaine and the patient did admit  that she took cocaine and the patient was admitted to telemetry floor.  Serial cardiac enzymes and EKGs were done.  EKG was just showing  nonspecific T-wave changes.  The patient's troponins were followed  serially which all were negative.  CPK was mildly elevated.  On  admission it was 1165 which improved with stopping her Lipitor and  hydration.  The patient's chest pain had largely resolved.  At this time  2-D echo was done.  2-D echo showed no regional wall motion abnormality  and left ventricular function was estimated to be 55%.  Overall left  ventricular systolic function was normal and at this time as the  patient's symptoms were largely resolved I did call [facility]  PA Mr. Susy Frizzle and gave information about her and they will be calling her  as outpatient followup for consult and possible stress test.  At this  time the patient is strongly advised to quit smoking cigarettes or using  illegal drugs for which the patient has agreed.  At time of this  dictation the patient is hemodynamically stable.   PROCEDURES:  Done during this stay.  1. 2-D echo on August 26, 2008, shows overall left ventricular function      was normal, left ventricular ejection fraction was estimated to be      55%.  There was no left ventricular regional wall motion  abnormalities.  Left ventricular wall thickness was mildly      increased.  2. Chest x-ray on August 25, 2008, minimal cardiomegaly, mild pulmonary      vascular congestion.   PERTINENT LABS:  The patient's troponins all were negative.  The patient  had mildly elevated CPK attributed to mild rhabdomyolysis.  It was 1167  on admission and it has gradually decreased to 694 with hydration.  The  patient's drug screen was positive for cocaine and opiates.   FINAL DIAGNOSES:  1. Chest pain acute coronary syndrome rule out.  2. Polysubstance abuse.  3. Diabetes mellitus type 2.  4. Hypertension.  5. Chronic pain.  6. Depression with no suicidal ideation.  7. Hyperlipidemia.  8. Mild rhabdomyolysis.   MEDICATIONS AT DISCHARGE:  1. Actos 30 mg p.o. daily.  2. Aspirin 81 mg p.o. daily.  3. Ambien 5 mg p.r.n. bedtime.  4. Flexeril 20 mg p.o. t.i.d.  5. Glucotrol 5 mg p.o. daily.  6. HCTZ 25 mg p.o. daily.  7. Lantus 20 units  subcutaneous at bedtime.  8. Levothyroxine 100 mcg p.o. daily.  9. Lipitor 40 mg p.o. daily.  10.Lisinopril 40 mg p.o. daily.  11.Nabumetone 500 mg p.o. daily.  12.Protonix 40 mg p.o. daily.  13.Prozac 40 mg p.o. daily.  14.Ultram 50 mg p.o. q.6 p.r.n. for pain.  15.Trazodone 100 mg p.o. at bedtime.  16.Nicotine patch as prescribed.   PLAN:  The patient was advised to follow up with [facility] within a  week's time to recheck her CMET and CPK and to restart Lipitor only if  CPK is normal and to recheck the CPK after restarting Lipitor within a  week's time and to follow with [facility] as scheduled and when  [facility] calls her for an appointment.  To be on a cardiac  healthy, carb modified diet, to recheck her blood sugars a.c. and h.s.  and be cautious about hypoglycemia.  Strongly advised to quit smoking  cigarettes and using cocaine.      Eduard Clos, MD  Electronically Signed     ANK/MEDQ  D:  08/28/2008  T:  08/28/2008   Job:  [id number]   cc:   Melvern Banker
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
## 2010-10-10 NOTE — H&P (Signed)
NAMEALLIYAH, ROESLER                 ACCOUNT NO.:  [id number]   MEDICAL RECORD NO.:  [id number]          PATIENT TYPE:  INP   LOCATION:  3709                         FACILITY:  [facility]   PHYSICIAN:  Jesse Sans. Wall, MD, FACCDATE OF BIRTH:  [birth year]   DATE OF ADMISSION:  09/03/2008  DATE OF DISCHARGE:                              HISTORY & PHYSICAL   PRIMARY CARDIOLOGIST:  Maisie Fus C. Daleen Squibb, MD, [facility]   PRIMARY CARE PHYSICIAN:  Dr. Ludwig Clarks at [facility].   HISTORY OF PRESENT ILLNESS:  This is a 55 year old African American  female who was recently discharged 6 days ago from [facility]  with an admission at that time with chest pain and hypertension and  cocaine abuse.  The patient was discharged on August 28, 2008, and did not  take any of her medications on discharge stating that she could not  afford them.  The patient also admits to ongoing cocaine use.  The  patient states she started having chest pain yesterday around 4 p.m.  while watching television, feeling like a prick in her chest lasting 30  seconds to a minute that made her double over in pain with dyspnea.  The  pain went away slowly, but she had some soreness, and it was bearable.  The pain continued on and off throughout the night, constant, which kept  waking her up.  This a.m. when walking outside, she felt a prick on her  chest along with dyspnea and nausea and vomiting.  The patient called  EMS and she was brought into the emergency room.  Her pain remained an  8/10.  She is quite anxious and tearful.  The patient states that she  know she should not be using cocaine, and be medically compliant,  however, she has not had any financial assistance, even though she has  applied through social services.   REVIEW OF SYSTEMS:  Positive for chest pain, shortness of breath,  dyspnea on exertion, nausea, and vomiting along with anxiety.  All other  systems were reviewed and found to be negative.   PAST MEDICAL  HISTORY:  1. Hypertension.  2. Hyperlipidemia.  3. Tobacco abuse.  4. Cocaine abuse.  5. Bipolar with history of suicidal ideation and psychiatric admission      in the past.  6. Asthma.  7. Diabetes.  8. Hypothyroidism.  9. Osteoarthritis.   PAST SURGICAL HISTORY:  Hysterectomy.   PAST CARDIAC WORKUP:  The patient did have an echocardiogram completed  during her last admission in April revealing normal LV function.   SOCIAL HISTORY:  The patient lives in [place] with her niece.  She is  unemployed.  She is single.  She is a 30-pack-year smoker.  Negative for  EtOH.  Positive for cocaine and marijuana, most recent use on September 02, 2008 (day before admission).   FAMILY HISTORY:  Both mother and father died from complications of CAD.   CURRENT MEDICATIONS:  She is taking none at home.   MEDICATIONS PRESCRIBED ON DISCHARGE:  1. Actos 30 mg daily.  2. Aspirin 81 mg daily.  3. Calcium daily.  4. Depakote 1500 mg at bedtime.  5. Flexeril 10 mg t.i.d.  6. Glucotrol 5 mg daily.  7. HCTZ 25 mg daily.  8. Levothyroxine 100 mcg daily.  9. Lipitor 40 mg at bedtime.  10.Lisinopril 5 mg daily.  11.Prozac 10 mg daily.   ALLERGIES:  PENICILLIN AND CODEINE.   LABORATORY DATA:  Hemoglobin 14.2, hematocrit 41.9, white blood cells  8.2, platelets 239.  Sodium 138, potassium 3.4, chloride 99, CO2 28, BUN  9, creatinine 1.6, glucose 145.  Troponin less than 0.05.  Chest x-ray  revealing mild enlargement of cardiopulmonary silhouette with pulmonary  vascular congestion noted.   PHYSICAL EXAMINATION:  VITAL SIGNS:  Blood pressure 149/79, pulse 81,  respirations 24, O2 sat 100% on room air.  GENERAL:  She is awake, alert, oriented, but anxious.  HEENT:  Head is normocephalic and atraumatic.  Eyes, PERRLA.  Mucous  membranes of mouth pink and moist.  Tongue midline.  NECK:  Supple.  No carotid bruits.  No JVD.  CARDIOVASCULAR:  Tachycardic with a soft S4 murmur.  No rubs or gallops.   Pulses are 2+ and equal without bruits.  LUNGS:  Clear to auscultation without wheezes, rales, or rhonchi.  ABDOMEN:  Soft with mild tenderness in the lower abdomen.  EXTREMITIES:  Without clubbing, cyanosis, or edema.  NEUROLOGIC:  Cranial nerves II through XII are grossly intact.   IMPRESSION:  1. Chest pain.  2. Hypertension.  3. Diabetes.  4. Cocaine and tobacco abuse, ongoing.  5. Hyperlipidemia.  6. Medical noncompliance.  7. Psychiatric issues.  8. Cardiovascular risk factors, family history, tobacco, hypertension,      cholesterol, diabetes, drug abuse.   PLAN:  This 54 year old African American female who was recently  discharged 6 days ago who returns for chest pain and admits to not  taking medications since smoking cocaine.  The patient is admitted with  multiple cardiovascular risk factors and continued cocaine and tobacco  abuse.  We will admit, begin verapamil, aspirin,  benzos, and plan stress Myoview on Sunday, April, 11, 2010.  The patient  was scheduled to have an outpatient stress Myoview, but did not come to  have this completed.  We will do this now during an inpatient. She  verbalizes understanding and is willing to be admitted.      Bettey Mare. Lyman Bishop, NP      Jesse Sans. Daleen Squibb, MD, [facility]  Electronically Signed    KML/MEDQ  D:  09/03/2008  T:  09/04/2008  Job:  [id number]

## 2010-10-10 NOTE — Discharge Summary (Signed)
[name], PUCCINI                 ACCOUNT NO.:  [id number]   MEDICAL RECORD NO.:  [id number]          PATIENT TYPE:  INP   LOCATION:  3709                         FACILITY:  [facility]   PHYSICIAN:  Everardo Beals. Juanda Chance, MD, FACCDATE OF BIRTH:  05/22/1956   DATE OF ADMISSION:  09/03/2008  DATE OF DISCHARGE:  09/06/2008                               DISCHARGE SUMMARY   PRIMARY CARDIOLOGIST:  Maisie Fus C. Daleen Squibb, MD, [facility]   PRIMARY CARE PHYSICIAN:  Dr. Ludwig Clarks.   DISCHARGE DIAGNOSES:  1. Noncardiac chest pain.  2. Hypothyroidism.  3. Urinary tract infection.   SECONDARY DIAGNOSES:  1. Hypertension.  2. Hyperlipidemia.  3. Tobacco abuse.  4. Cocaine abuse.  5. Bipolar with history of suicidal ideation and psychiatric admission      in the past.  6. Asthma.  7. Diabetes.  8. Hypothyroidism.  9. Osteoarthritis.  10.Status post hysterectomy.   ALLERGIES:  1. PENICILLIN.  2. CODEINE.   PROCEDURES PERFORMED DURING THIS HOSPITALIZATION:  1. EKG showing sinus rhythm with a rate of 81 bpm, nonspecific T-wave      abnormalities diffusely, no significant Q waves, normal axis, no      evidence of hypertrophy, no other significant change from tracing      completed on August 26, 2008.  2. Chest x-ray performed on September 03, 2008, showing mild enlargement of      the cardiopericardial silhouette and pulmonary vascular congestion.  3. EKG performed on September 04, 2008.  No significant change from prior      tracing.  4. EKG performed on September 05, 2008.  No significant change from prior      tracing.  5. On September 05, 2008, the patient had an adenosine stress Myoview      showing no evidence of ischemia, left ventricular function normal      with estimated ejection fraction of 50%.   HISTORY OF PRESENT ILLNESS:  [name] is a 55 year old African American  female who was recently admitted for chest pain, hypertension, and  cocaine abuse to [facility] and discharged 6 days ago, August 28, 2008.   The patient reports not taking any of her medications since her  discharge due to not being able to afford them.  The patient also admits  to ongoing cocaine use.  Today, she presents with chest pain since 4  p.m. yesterday (September 02, 2008).  The pain onset while watching  television, felt like a prick in her chest lasting 30 seconds to a  minute, but severe enough to make her double over and was associated  with shortness of breath.  The pain dissipated slowly but with  residual  soreness.  Pain continued throughout the night, constant, keeping her  up.  This a.m., when walking outside, felt a prick on her chest along  with dyspnea and nausea and vomiting.  The patient called EMS and was  brought to the ED.  Her pain was 8/10 and remained so.  At the time of  evaluation in the ED, she was quite anxious and tearful.  The  patient  reports understanding importance of cocaine abstinence and medication  compliance.  She has applied for financial assistance through [employer].   HOSPITAL COURSE:  The patient admitted and underwent procedures  described above.  She tolerated them well without significant  complications.  On April 10, the patient continued to have chest pain  and shortness of breath.  Her cardiac enzymes were negative, however,  she was noted to have an elevated TSH of 69.845 and a positive urine  drug screen for cocaine.  Due to the patient's ongoing symptoms, she was  scheduled for adenosine Myoview, results described above.  The patient  was then scheduled for discharge on April 11, however, she had no way of  getting to her home and would have been locked out due to her family's  absence, therefore, she is being discharged in stable condition on the  morning of September 06, 2008.  She has been instructed to follow up with  her primary care Shakiyah Cirilo regarding her symptoms and her elevated TSH.  She has been given a copy of her medication list with new prescriptions  as  well as her followup instructions in both oral and written form.  At  the time of her discharge, she had no questions or concerns that had not  been addressed.  Please note, the patient's vital signs remained stable  during hospital course.   MOST RECENT VITAL SIGNS ON DATE OF DISCHARGE:  Temp 98.0 degrees  Fahrenheit, BP 118/67, pulse 68, respiration rate 20, and O2 saturation  98% on room air.   DISCHARGE LABORATORY DATA:  See hospital course and H and P.  Also, the  patient had a positive urinalysis with cloudy appearance, small amount  of urine glucose, 15 bilirubin, 15 ketones, 30 of protein, many  leukocytes, and many bacteria.   FOLLOWUP PLANS AND APPOINTMENTS:  Followup [facility] in 1-2 weeks.   DISCHARGE MEDICATIONS:  1. Actos 30 mg p.o. daily.  2. Aspirin 81 mg p.o. daily.  3. Depakote 1500 mg p.o. nightly.  4. Flexeril 10 mg p.o. t.i.d. p.r.n.  5. Glucotrol 5 mg p.o. daily.  6. Hydrochlorothiazide 25 mg p.o. daily.  7. Lantus 20 units subcu injection nightly.  8. Levothyroxine 100 mcg p.o. daily.  9. Lipitor 40 mg p.o. daily.  10.Lisinopril 40 mg p.o. daily.  11.Nabumetone 500 mg p.o. daily.  12.Protonix 40 mg p.o. daily.  13.Verapamil SR 240 mg p.o. daily.  14.Lisinopril 10 mg p.o. daily.  15.Ciprofloxacin 250 mg p.o. b.i.d.  16.Prozac 10 mg p.o. daily.  17.Ultram 50 mg q.6 h. p.r.n.  18.Trazodone 100 mg p.o. nightly.   DURATION OF DISCHARGE ENCOUNTER INCLUDING PHYSICIAN TIME:  45 minutes.      Jarrett Ables, PAC      Bruce R. Juanda Chance, MD, [facility]  Electronically Signed    MS/MEDQ  D:  09/06/2008  T:  09/07/2008  Job:  [id number]   cc:   Thomas C. Daleen Squibb, MD, [facility]  Dr. Ludwig Clarks

## 2011-02-16 LAB — DIFFERENTIAL
Basophils Absolute: 0
Basophils Relative: 0
Eosinophils Absolute: 0.1
Eosinophils Relative: 1
Lymphocytes Relative: 24
Lymphs Abs: 2.3
Monocytes Absolute: 0.7
Monocytes Relative: 7
Neutro Abs: 6.7
Neutrophils Relative %: 68

## 2011-02-16 LAB — CBC
HCT: 38.7
Hemoglobin: 11.7 — ABNORMAL LOW
Hemoglobin: 13
MCHC: 33.7
MCV: 88.2
Platelets: 298
RBC: 3.89
RBC: 4.39
RDW: 16.1 — ABNORMAL HIGH
WBC: 8.6
WBC: 9.8

## 2011-02-16 LAB — LIPASE, BLOOD: Lipase: 32

## 2011-02-16 LAB — BASIC METABOLIC PANEL
BUN: 8
Calcium: 9.3
Creatinine, Ser: 1.3 — ABNORMAL HIGH
GFR calc non Af Amer: 44 — ABNORMAL LOW
Glucose, Bld: 646

## 2011-02-16 LAB — COMPREHENSIVE METABOLIC PANEL
ALT: 19
AST: 27
Alkaline Phosphatase: 91
CO2: 24
Chloride: 104
GFR calc Af Amer: 57 — ABNORMAL LOW
GFR calc non Af Amer: 47 — ABNORMAL LOW
Glucose, Bld: 272 — ABNORMAL HIGH
Potassium: 3.7
Sodium: 135

## 2011-02-16 LAB — ETHANOL: Alcohol, Ethyl (B): <5

## 2011-02-16 LAB — BASIC METABOLIC PANEL WITH GFR
CO2: 28
Chloride: 94 — ABNORMAL LOW
GFR calc Af Amer: 53 — ABNORMAL LOW
Potassium: 4.2
Sodium: 133 — ABNORMAL LOW

## 2011-02-16 LAB — URINALYSIS, ROUTINE W REFLEX MICROSCOPIC
Bilirubin Urine: NEGATIVE
Glucose, UA: >1000 — AB
Ketones, ur: NEGATIVE
Leukocytes, UA: NEGATIVE
Nitrite: NEGATIVE
Protein, ur: NEGATIVE
Specific Gravity, Urine: >1.046 — ABNORMAL HIGH
Urobilinogen, UA: 0.2
pH: 6

## 2011-02-16 LAB — RAPID URINE DRUG SCREEN, HOSP PERFORMED
Amphetamines: NOT DETECTED
Barbiturates: NOT DETECTED
Benzodiazepines: NOT DETECTED
Cocaine: POSITIVE — AB
Opiates: NOT DETECTED
Tetrahydrocannabinol: NOT DETECTED

## 2011-02-16 LAB — GLUCOSE, RANDOM: Glucose, Bld: 249 — ABNORMAL HIGH

## 2011-02-16 LAB — URINE CULTURE: Colony Count: 50000

## 2011-02-16 LAB — LIPID PANEL
HDL: 44
LDL Cholesterol: 81
Triglycerides: 117
VLDL: 23

## 2011-02-16 LAB — B-NATRIURETIC PEPTIDE (CONVERTED LAB): Pro B Natriuretic peptide (BNP): <30

## 2011-02-16 LAB — HEMOGLOBIN A1C: Hgb A1c MFr Bld: 11.8 — ABNORMAL HIGH

## 2011-02-16 LAB — URINE MICROSCOPIC-ADD ON

## 2011-02-16 LAB — TSH: TSH: 107.34 — ABNORMAL HIGH

## 2011-02-19 LAB — ETHANOL: Alcohol, Ethyl (B): <5

## 2011-02-19 LAB — CBC
HCT: 36.5
Platelets: 271
RDW: 15.6 — ABNORMAL HIGH
WBC: 8.2

## 2011-02-19 LAB — COMPREHENSIVE METABOLIC PANEL
ALT: 19
AST: 31
Albumin: 3.7
Alkaline Phosphatase: 78
BUN: 16
Chloride: 101
Potassium: 3.4 — ABNORMAL LOW
Sodium: 137
Total Bilirubin: 0.4
Total Protein: 7.4

## 2011-02-19 LAB — RAPID URINE DRUG SCREEN, HOSP PERFORMED
Benzodiazepines: NOT DETECTED
Cocaine: POSITIVE — AB
Tetrahydrocannabinol: NOT DETECTED

## 2011-03-02 ENCOUNTER — Encounter (HOSPITAL_COMMUNITY): Payer: Self-pay | Admitting: Psychology

## 2011-03-27 ENCOUNTER — Ambulatory Visit (INDEPENDENT_AMBULATORY_CARE_PROVIDER_SITE_OTHER): Payer: Self-pay | Admitting: Psychology

## 2011-03-27 DIAGNOSIS — F259 Schizoaffective disorder, unspecified: Secondary | ICD-10-CM

## 2011-04-27 ENCOUNTER — Encounter (HOSPITAL_COMMUNITY): Payer: Self-pay | Admitting: Psychiatry

## 2011-04-27 ENCOUNTER — Ambulatory Visit (HOSPITAL_COMMUNITY): Payer: Self-pay | Admitting: Psychiatry

## 2011-04-27 VITALS — Wt 206.0 lb

## 2011-04-27 DIAGNOSIS — Z79899 Other long term (current) drug therapy: Secondary | ICD-10-CM

## 2011-04-27 DIAGNOSIS — F209 Schizophrenia, unspecified: Secondary | ICD-10-CM

## 2011-04-27 MED ORDER — DIAZEPAM 2 MG PO TABS: 2.0000 mg | ORAL_TABLET | Freq: Every evening | ORAL | Status: AC | PRN

## 2011-04-27 MED ORDER — DIVALPROEX SODIUM ER 500 MG PO TB24: ORAL_TABLET | ORAL | Status: DC

## 2011-04-27 MED ORDER — SERTRALINE HCL 100 MG PO TABS: 100.0000 mg | ORAL_TABLET | Freq: Every day | ORAL | Status: DC

## 2011-04-27 MED ORDER — THIOTHIXENE 5 MG PO CAPS: ORAL_CAPSULE | ORAL | Status: DC

## 2011-04-27 MED ORDER — BENZTROPINE MESYLATE 0.5 MG PO TABS: 0.5000 mg | ORAL_TABLET | Freq: Every day | ORAL | Status: DC

## 2011-04-27 NOTE — Progress Notes (Signed)
Please see dictation number [phone]

## 2011-04-27 NOTE — Progress Notes (Signed)
[name], [name]                 ACCOUNT NO.:  [id number]  MEDICAL RECORD NO.:  [id number]  LOCATION:  [facility]                           FACILITY:  [facility]  PHYSICIAN:  Kissa Campoy T. Reneshia Zuccaro, M.D.   DATE OF BIRTH:  11-10-1955                                PROGRESS NOTE   Patient is a 55 year old, separated, African American female who was here for her initial appointment.  Patient was seen by counselor a few weeks ago for evaluation, however, required medication and needed an appointment with a psychiatrist.  Patient told that she was getting medication from [facility], however, since they changed to [facility] she is not happy with their services and would like to get treatment here.  Patient told that she has been diagnosed with schizophrenia, chronic paranoid type, for many years and has been getting medication for her illness.  At her last visit to [facility], she did not like the treatment as they tried to discontinue her Valium and change to a different antipsychotic medication.  Patient told she was given Abilify and Neurontin, however, she never filled those prescriptions and continued to take her old refill until she ran out a few weeks ago. Patient told since she has been out of her medication her depression, anxiety, and paranoia has been worse.  She has been sleeping only 1 to 2 hours, hearing voices, getting more depressed, socially isolated, and withdrawn.  She also reported poor attention, concentration, and getting frustrated easily.  She has been taking Navane, Zoloft, Valium, and Depakote until she ran out a few weeks ago.  Patient needed her refill and would like to continue her care in this office.  Patient endorsed history of psychiatric admission due to noncompliance with medication.  PAST PSYCHIATRIC HISTORY: Patient admitted history of at least 3 psychiatric admissions due to noncompliance with medication and decompensation into her illness.  She has at least 2 suicidal  attempts with overdose and required admission at [facility].  Her last admission was in 2009 when she admitted as a voluntary after she overdosed on medication.  She had started seeing psychiatric care at a very early age.  In the past, she had tried [place], Risperdal, Abilify, Zyprexa, Seroquel, Prozac, but she does not like any of these medications either due to poor response or side effects.  As mentioned above, patient has been seeing a psychiatrist at [facility] for past few years until they changed to [place].  FAMILY HISTORY: Patient admitted her uncle has history of bipolar disorder.  PSYCHOSOCIAL HISTORY: Patient was born and raised in [place], [place].  She had 3 children who live in [place].  She has been legally separated for many years. She moved to [place] in 2008 when she came out from jail where she was there for 6 months due to signing her own check with no money in the bank account.  Patient is living with her fiance' and reported her relationship is good.  She is still in touch with the children in [place] and their father very often.  Patient denies any history of physical, sexual, or verbal abuse.  EDUCATION AND WORK HISTORY: Patient  has some college education, however, she is currently not working.  ALCOHOL AND SUBSTANCE ABUSE HISTORY: Patient has history of using cocaine and drinking alcohol in the past. She has been in [facility] for drugs but she claims to be alcohol and cocaine free for many years.  MEDICAL HISTORY: Patient has significant health issues including: 1. Hypertension. 2. Hyperlipidemia. 3. Asthma. 4. Diabetes. 5. Osteoarthritis. 6. Hypothyroidism. 7. She sees Dr. Andrey Campanile at [facility]. 8. She has history of hysterectomy.  CURRENT MEDICATIONS: She takes: 1. Levothyroxine 125 mcg daily. 2. Metformin 500 mg half twice a day. 3. Glucotrol 5 mg in the morning. 4. Hydrochlorothiazide 25 mg  daily. 5. Lisinopril 40 mg daily. 6. Percocet 5/325 mg as needed for pain.  She is also taking psychotropic medication including: 1. Depakote 1500 mg at bedtime. 2. Zoloft 100 mg daily. 3. Trazodone 100 mg one to two at bedtime. 4. Valium 2 mg 3 times a day. 5. Navane 5 mg at bedtime.  However, she has been off from her psychiatric medication for past 4 to 5 weeks.  MENTAL STATUS EXAMINATION: Patient is mildly obese female who is casually dressed and maintained a fair eye contact.  Her speech is slow but clear.  Her thought process is also slow and at times circumstantial.  Her attention and concentration are poor.  She has difficulty at times engaging in conversation.  She gets easily distracted.  She endorsed suicidal thinking but they are not active.  She also endorsed auditory hallucinations but they are vague. She endorsed paranoid thinking at times when she believes people are watching her but they are not intense and patient is able to distract herself and believe they are not real.  Her speech is soft.  She is alert and oriented x3.  She denies any active suicidal thinking, as well as she denies any homicidal thinking.  Her insight, judgment, and impulse control is okay.  DIAGNOSIS: AXIS I:  Schizophrenia, chronic paranoid type. AXIS II:  Deferred. AXIS III:  See medical history. AXIS IV:  Moderate. Axis V:  55 to 60.  PLAN: I talked to the patient in length about restarting her medications.  I do believe patient has been slowly decompensating since she has been off of her medication.  She wants to go back on Navane, Zoloft, Valium, and Depakote, however, since she has been off for many weeks I recommended not to go on full dose.  However, we will start the Depakote 1000 mg, Navane 5 mg at bedtime, Zoloft 100 mg daily, and Valium just 2 mg as needed for p.r.n.  Patient agreed with the plan and hoping that starting this medication will help an alleviate these  symptoms.  She also would like to continue therapist for increased coping and social skills and she felt that her symptoms are getting worse then she will tell her which she is scheduled to see next week.  I will also add Cogentin 0.5 mg at bedtime to target those tremors which she reported in the past was caused by Navane.  I will also get routine CBC, chemistry, Depakote level, and hemoglobin A1c.  I also encouraged her to continue her appointment with the [facility] physician for her regular checkups.  We talked about safety plan that in case she feels her symptoms are getting worse or any time having suicidal thinking and homicidal thinking then she needs to call us immediately or go to local ER which she accepted. I explained risks and benefits of  medication.  I will see her again in 6 weeks as patient requested to come back in 6 weeks as she has been planning to go to [place] in the Christmas holidays.  I will see her again in 6 weeks.     Orien Mayhall T. Lolly Mustache, M.D.     STA/MEDQ  D:  04/27/2011  T:  04/27/2011  Job:  [id number]

## 2011-05-03 ENCOUNTER — Ambulatory Visit (HOSPITAL_COMMUNITY): Payer: Self-pay | Admitting: Psychology

## 2011-06-06 ENCOUNTER — Ambulatory Visit (HOSPITAL_COMMUNITY): Payer: Self-pay | Admitting: Psychiatry

## 2011-07-16 ENCOUNTER — Other Ambulatory Visit (HOSPITAL_COMMUNITY): Payer: Self-pay | Admitting: Psychiatry

## 2011-07-30 LAB — HM DIABETES EYE EXAM

## 2011-09-04 ENCOUNTER — Other Ambulatory Visit (HOSPITAL_COMMUNITY): Payer: Self-pay | Admitting: Psychiatry

## 2011-09-18 ENCOUNTER — Ambulatory Visit (INDEPENDENT_AMBULATORY_CARE_PROVIDER_SITE_OTHER): Payer: Medicare Other | Admitting: Internal Medicine

## 2011-09-18 ENCOUNTER — Encounter: Payer: Self-pay | Admitting: Internal Medicine

## 2011-09-18 VITALS — BP 136/90 | HR 82 | Temp 97.5°F | Ht 62.0 in | Wt 205.0 lb

## 2011-09-18 DIAGNOSIS — K859 Acute pancreatitis without necrosis or infection, unspecified: Secondary | ICD-10-CM

## 2011-09-18 DIAGNOSIS — E785 Hyperlipidemia, unspecified: Secondary | ICD-10-CM

## 2011-09-18 DIAGNOSIS — M109 Gout, unspecified: Secondary | ICD-10-CM

## 2011-09-18 DIAGNOSIS — F209 Schizophrenia, unspecified: Secondary | ICD-10-CM

## 2011-09-18 DIAGNOSIS — I10 Essential (primary) hypertension: Secondary | ICD-10-CM

## 2011-09-18 DIAGNOSIS — M549 Dorsalgia, unspecified: Secondary | ICD-10-CM

## 2011-09-18 DIAGNOSIS — J45909 Unspecified asthma, uncomplicated: Secondary | ICD-10-CM | POA: Insufficient documentation

## 2011-09-18 DIAGNOSIS — E039 Hypothyroidism, unspecified: Secondary | ICD-10-CM

## 2011-09-18 DIAGNOSIS — E1149 Type 2 diabetes mellitus with other diabetic neurological complication: Secondary | ICD-10-CM | POA: Insufficient documentation

## 2011-09-18 DIAGNOSIS — E119 Type 2 diabetes mellitus without complications: Secondary | ICD-10-CM

## 2011-09-18 DIAGNOSIS — M25579 Pain in unspecified ankle and joints of unspecified foot: Secondary | ICD-10-CM

## 2011-09-18 DIAGNOSIS — G8929 Other chronic pain: Secondary | ICD-10-CM | POA: Insufficient documentation

## 2011-09-18 MED ORDER — DICLOFENAC SODIUM 50 MG PO TBEC: DELAYED_RELEASE_TABLET | ORAL | Status: AC

## 2011-09-18 MED ORDER — LEVOTHYROXINE SODIUM 150 MCG PO TABS: 150.0000 ug | ORAL_TABLET | Freq: Every day | ORAL | Status: DC

## 2011-09-18 MED ORDER — COLCHICINE 0.6 MG PO TABS: 0.6000 mg | ORAL_TABLET | Freq: Every day | ORAL | Status: DC

## 2011-09-18 MED ORDER — ALLOPURINOL 100 MG PO TABS: 100.0000 mg | ORAL_TABLET | Freq: Every day | ORAL | Status: DC

## 2011-09-18 MED ORDER — LISINOPRIL 20 MG PO TABS: 20.0000 mg | ORAL_TABLET | Freq: Every day | ORAL | Status: DC

## 2011-09-18 NOTE — Progress Notes (Signed)
  Subjective:    Patient ID: [name], female    DOB: 1955/07/17, 56 y.o.   MRN: [id number]  HPI Pt presents to clinic for evaluation of multiple medical problems. States recently hospitalized at [facility] with pancreatitis. Reports past possible mild pancreatitis not requiring hospitalization. Scheduled for outpt GI f/u and describes recommendation for possible ERCP vs EUS. Denies etoh use.   States cannot tolerate metformin for DM due to diarrhea and stopped the medication 5 days ago. Subsequent fsbs 116-127 without medication. Was apparently placed on lipitor+lopid during hospitalization and tolerates without side effect. States BP well controlled with outpt monitoring.  H/o gout primarily involving feet and takes prophylactic allopurinol and colchine daily. Running out of colchicine and requests refill.   Known psychiatric hx with documented dx of paranoid schizophrenia with past auditory hallucinations. Past suicide attempts by OD. Past substance abuse with cocaine now reports abstinence. Previously followed by [facility] Pt reports off medications and doing well without complaint. Reports last psychiatric care several years ago however chart review shows appt 11/12 and did not present for follow up.   States h/o chronic low back pain without h/o surgery or epidural injections. Records unavailable but reports mri of ?LS spine ~6 months ago. Denies having undergone PT. Takes percocet chronically for this. Reports dose of 5mg  but during nursing intake stated that she was taking 10mg . Reports was given prescription at time of discharge from hospital. [place] narcotic database reviewed- has received narcotic from multiple unrelated providers including clinics, ED and dentist. Total of 15 providers within past year and intermittently prescription quantities have overlapped from different providers. Reviewed with patient. Prior to hospitalization last received narcotic from previous pmd 08/07/2011  #30. No evidence of withdrawal noted.   Complains of several day h/o right ankle pain without known injury. Taking no medication for problem. Worsens with position change. Able to wt bear and ambulate without assistance.   Review of Systems  Gastrointestinal: Negative for abdominal pain.  Musculoskeletal: Positive for back pain and arthralgias. Negative for gait problem.  Psychiatric/Behavioral: Negative for suicidal ideas, hallucinations, confusion, self-injury and agitation. The patient is not nervous/anxious.   All other systems reviewed and are negative.       Objective:   Physical Exam  Nursing note and vitals reviewed. Constitutional: She appears well-developed and well-nourished. No distress.  HENT:  Head: Normocephalic and atraumatic.  Right Ear: External ear normal.  Left Ear: External ear normal.  Eyes: Conjunctivae are normal. No scleral icterus.  Neck: Neck supple. No JVD present. Carotid bruit is not present.  Cardiovascular: Normal rate, regular rhythm and normal heart sounds.  Exam reveals no gallop and no friction rub.   No murmur heard. Pulmonary/Chest: Effort normal and breath sounds normal.  Musculoskeletal:       Gait nl. Able to wt bear and ambulate without assistance. Right ankle FROM. Variable tenderness ?achilles tendon and horizontal ligamental area. Achilles tendon intact with FROM. No erythema warmth or effusion of ankle.  Lymphadenopathy:    She has no cervical adenopathy.  Neurological: She is alert.  Skin: Skin is warm and dry. She is not diaphoretic.  Psychiatric: She has a normal mood and affect. Her behavior is normal. Judgment and thought content normal.          Assessment & Plan:

## 2011-09-18 NOTE — Assessment & Plan Note (Signed)
Long discussion held regarding narcotic use. Concern exists for possible narcotic seeking behavior with h/o past substance abuse and past suicide attempts by OD. Informed pt do not feel comfortable with prescribing narcotic for her. States understanding. No evidence of withdrawal sx's. Informed will primary medical but without pain medication prescribing. Offered pain clinic referral versus return to previous PMD. Pt requests pain clinic referral. Attempt voltaren prn with food and no other nsaids.

## 2011-09-18 NOTE — Assessment & Plan Note (Signed)
Remain off metformin and monitor fsbs daily. Record request. Close f/u scheduled.

## 2011-09-18 NOTE — Assessment & Plan Note (Signed)
Appears compensated today. Denies sx's. Proceed with f/u referral for psychiatry.

## 2011-09-18 NOTE — Assessment & Plan Note (Signed)
Improved. Keep GI outpt f/u for further evaluation. DC thiazide. Record request for lipid status. Denies etoh

## 2011-09-18 NOTE — Assessment & Plan Note (Signed)
Attempt voltaren prn. Followup if no improvement or worsening.

## 2011-09-18 NOTE — Assessment & Plan Note (Signed)
States good outpt control. Dc hctz due to h/o pancreatitis and gout. Monitor bp and f/u in clinic closely for re-evaluation.

## 2011-09-21 ENCOUNTER — Telehealth: Payer: Self-pay | Admitting: Internal Medicine

## 2011-09-21 NOTE — Telephone Encounter (Signed)
Received medical records from [facility] at [facility]

## 2011-10-16 ENCOUNTER — Ambulatory Visit: Payer: Medicare Other | Admitting: Internal Medicine

## 2011-10-16 DIAGNOSIS — Z0289 Encounter for other administrative examinations: Secondary | ICD-10-CM

## 2011-10-17 ENCOUNTER — Encounter (HOSPITAL_COMMUNITY): Payer: Self-pay | Admitting: *Deleted

## 2011-10-17 ENCOUNTER — Ambulatory Visit (HOSPITAL_COMMUNITY): Payer: Medicare Other | Admitting: Psychiatry

## 2011-10-17 ENCOUNTER — Emergency Department (INDEPENDENT_AMBULATORY_CARE_PROVIDER_SITE_OTHER)
Admission: EM | Admit: 2011-10-17 | Discharge: 2011-10-17 | Disposition: A | Discharge status: Home / Self Care | Payer: Medicare Other | Source: Home / Self Care | Attending: Family Medicine | Admitting: Family Medicine

## 2011-10-17 DIAGNOSIS — M545 Low back pain: Secondary | ICD-10-CM

## 2011-10-17 DIAGNOSIS — G8929 Other chronic pain: Secondary | ICD-10-CM

## 2011-10-17 HISTORY — DX: Reserved for concepts with insufficient information to code with codable children: IMO0002

## 2011-10-17 HISTORY — DX: Acute pancreatitis without necrosis or infection, unspecified: K85.90

## 2011-10-17 MED ORDER — CYCLOBENZAPRINE HCL 10 MG PO TABS: 10.0000 mg | ORAL_TABLET | Freq: Three times a day (TID) | ORAL | Status: AC | PRN

## 2011-10-17 MED ORDER — CYCLOBENZAPRINE HCL 10 MG PO TABS: 10.0000 mg | ORAL_TABLET | Freq: Three times a day (TID) | ORAL | Status: DC | PRN

## 2011-10-17 MED ORDER — TRAMADOL-ACETAMINOPHEN 37.5-325 MG PO TABS: 1.0000 | ORAL_TABLET | Freq: Three times a day (TID) | ORAL | Status: AC | PRN

## 2011-10-17 NOTE — Discharge Instructions (Signed)
Take the prescribed medications as instructed. Start doing back exercises as soon as pain improves. Follow up with your primary care provider to discuss back rehabilitation therapy referral.

## 2011-10-17 NOTE — ED Notes (Signed)
Pt with c/o low back pain onset last night - per pt has history back pain - pain increasingly worse throughout the day - pain worse with movement -

## 2011-10-19 NOTE — ED Provider Notes (Signed)
History     CSN: [id number]  Arrival date & time 10/17/11  1406   First MD Initiated Contact with Patient 10/17/11 1606      Chief Complaint  Patient presents with  . Back Pain    (Consider location/radiation/quality/duration/timing/severity/associated sxs/prior treatment) HPI Comments: 56 y/o female with h/o HTN, DM and DJD with chronic low back pain here c/o ow back pain exacerbation. Ran out pain medications (oxycodone) wants refills to breach her to next doctors visit. Denies dysuria or hematuria. No radiation of pain to lower extremities, no incontinence.    Past Medical History  Diagnosis Date  . Asthma     past 10 years  . Arthritis     djd -lower back  . Diabetes mellitus     past 5 years  . Glaucoma     dx 06/2011-[facility]  . Hypertension   . Hyperlipidemia   . Hyperthyroidism   . History of TIA (transient ischemic attack)     01/06/2011  . Pancreatitis   . Degenerative disc disease     Past Surgical History  Procedure Date  . Vaginal hysterectomy 1990    Family History  Problem Relation Age of Onset  . Arthritis Mother   . Heart disease      mother & father  . Hypertension      mother & father  . Colon cancer Neg Hx   . Diabetes Mother   . Prostate cancer Neg Hx   . Breast cancer Neg Hx     History  Substance Use Topics  . Smoking status: Current Everyday Smoker -- 1.0 packs/day for 30 years    Types: Cigarettes  . Smokeless tobacco: Never Used  . Alcohol Use: No    OB History    Grav Para Term Preterm Abortions TAB SAB Ect Mult Living                  Review of Systems  Constitutional: Negative for fever, chills and fatigue.  Genitourinary: Negative for dysuria, frequency, hematuria and flank pain.  Musculoskeletal: Positive for back pain. Negative for gait problem.  Skin: Negative for rash.  Neurological: Negative for headaches.    Allergies  Vicodin  Home Medications   Current Outpatient Rx  Name Route Sig  Dispense Refill  . ALBUTEROL SULFATE HFA 108 (90 BASE) MCG/ACT IN AERS Inhalation Inhale 2 puffs into the lungs every 4 (four) hours as needed.    . ALLOPURINOL 100 MG PO TABS Oral Take 1 tablet (100 mg total) by mouth daily. 30 tablet 3  . ASPIRIN 81 MG PO TABS Oral Take 81 mg by mouth daily.    . ATORVASTATIN CALCIUM 20 MG PO TABS Oral Take 20 mg by mouth daily.    Marland Kitchen BRIMONIDINE TARTRATE 0.1 % OP SOLN Ophthalmic Apply 1 drop to eye at bedtime.    . COLCHICINE 0.6 MG PO TABS Oral Take 1 tablet (0.6 mg total) by mouth daily. 30 tablet 3  . GEMFIBROZIL 600 MG PO TABS Oral Take 600 mg by mouth 2 (two) times daily before a meal.    . LEVOTHYROXINE SODIUM 150 MCG PO TABS Oral Take 1 tablet (150 mcg total) by mouth daily. 30 tablet 3  . LISINOPRIL 20 MG PO TABS Oral Take 1 tablet (20 mg total) by mouth daily. 30 tablet 3  . METFORMIN HCL 500 MG PO TABS Oral Take 500 mg by mouth 2 (two) times daily with a meal.    .  OXYCODONE HCL 5 MG PO CAPS Oral Take 5 mg by mouth every 6 (six) hours as needed.    Marland Kitchen PRESCRIPTION MEDICATION  Medication for pancreatitis    . CYCLOBENZAPRINE HCL 10 MG PO TABS Oral Take 1 tablet (10 mg total) by mouth 3 (three) times daily as needed for muscle spasms. 20 tablet 0  . HYDROCHLOROTHIAZIDE 25 MG PO TABS Oral Take 25 mg by mouth daily.    . TRAMADOL-ACETAMINOPHEN 37.5-325 MG PO TABS Oral Take 1 tablet by mouth every 8 (eight) hours as needed for pain. 20 tablet 0    BP 175/96  Pulse 75  Temp(Src) 97.9 F (36.6 C) (Oral)  Resp 16  SpO2 96%  Physical Exam  Nursing note and vitals reviewed. Constitutional: She is oriented to person, place, and time. She appears well-developed and well-nourished. No distress.       obese  HENT:  Head: Normocephalic and atraumatic.  Eyes: Conjunctivae are normal. No scleral icterus.  Neck: Normal range of motion. Neck supple.  Cardiovascular: Normal heart sounds.   Pulmonary/Chest: Breath sounds normal.  Abdominal: Soft. She  exhibits no mass. There is no tenderness.  Musculoskeletal:       Spine central with no obvious scoliosis. Fair flexion and extension. Normal symmetric Achilles and Rotula DTRs. No pain over bone prominences. Tenderness over lumbar bilateral paravertebral muscles.  No limping.   Neurological: She is alert and oriented to person, place, and time. She has normal reflexes.  Skin: No rash noted.    ED Course  Procedures (including critical care time)  Labs Reviewed - No data to display No results found.   1. Chronic low back pain       MDM  Treated symptomatically with flexeril and tramadol hand out for back exercises provided.        Sharin Grave, MD 10/19/11 [phone]

## 2012-01-02 ENCOUNTER — Telehealth (HOSPITAL_COMMUNITY): Payer: Self-pay

## 2012-01-02 NOTE — Telephone Encounter (Signed)
This patient has not been seen since 11/12.  Has no showed for several appointments since then.  Called on 01/02/12 wanting medication and to see a doctor.  I informed patient we had no openings and she can go to the emergency room or the assessment office.  I transferred her to assessments. dlo

## 2012-01-23 ENCOUNTER — Ambulatory Visit (HOSPITAL_COMMUNITY): Payer: Medicare Other | Admitting: Psychiatry

## 2012-02-29 ENCOUNTER — Emergency Department (HOSPITAL_COMMUNITY)
Admission: EM | Admit: 2012-02-29 | Discharge: 2012-03-03 | Disposition: A | Discharge status: Other Acute Inpatient Hospital | Payer: PRIVATE HEALTH INSURANCE | Attending: Emergency Medicine | Admitting: Emergency Medicine

## 2012-02-29 ENCOUNTER — Encounter (HOSPITAL_COMMUNITY): Payer: Self-pay | Admitting: Emergency Medicine

## 2012-02-29 DIAGNOSIS — F3289 Other specified depressive episodes: Secondary | ICD-10-CM | POA: Insufficient documentation

## 2012-02-29 DIAGNOSIS — F329 Major depressive disorder, single episode, unspecified: Secondary | ICD-10-CM | POA: Insufficient documentation

## 2012-02-29 DIAGNOSIS — R45851 Suicidal ideations: Secondary | ICD-10-CM | POA: Insufficient documentation

## 2012-02-29 DIAGNOSIS — I10 Essential (primary) hypertension: Secondary | ICD-10-CM | POA: Insufficient documentation

## 2012-02-29 DIAGNOSIS — Z79899 Other long term (current) drug therapy: Secondary | ICD-10-CM | POA: Insufficient documentation

## 2012-02-29 DIAGNOSIS — T50901A Poisoning by unspecified drugs, medicaments and biological substances, accidental (unintentional), initial encounter: Secondary | ICD-10-CM | POA: Insufficient documentation

## 2012-02-29 DIAGNOSIS — R45850 Homicidal ideations: Secondary | ICD-10-CM | POA: Insufficient documentation

## 2012-02-29 DIAGNOSIS — E119 Type 2 diabetes mellitus without complications: Secondary | ICD-10-CM | POA: Insufficient documentation

## 2012-02-29 HISTORY — DX: Depression, unspecified: F32.A

## 2012-02-29 HISTORY — DX: Gout, unspecified: M10.9

## 2012-02-29 HISTORY — DX: Major depressive disorder, single episode, unspecified: F32.9

## 2012-02-29 LAB — CBC
HCT: 37.9 % (ref 36.0–46.0)
MCV: 84 fL (ref 78.0–100.0)
RBC: 4.51 MIL/uL (ref 3.87–5.11)
WBC: 7.5 10*3/uL (ref 4.0–10.5)

## 2012-02-29 NOTE — ED Notes (Signed)
Pt states she is on a lot of medication and is trying to get in a 28 day program  Pt states today she is feeling suicidal  Pt states she went out with friends last night and was smoking cigarettes with cocaine in them  Pt states last night she took her medication  Pt states she took cymbalta 2 60mg  tabs then took 2 more, her zoloft twice, visteril 2 tabs, and some lorazepam 2 tabs  Pt states she took all this in the last 3 hrs  Pt states she is sleepy and just nods off in a conversation
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
## 2012-02-29 NOTE — ED Notes (Signed)
Pt changed into hospital gown, all belongings placed in one pt belonging bag and are at nurses station. Security called to wand pt.
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
## 2012-03-01 ENCOUNTER — Encounter (HOSPITAL_COMMUNITY): Payer: Self-pay | Admitting: *Deleted

## 2012-03-01 LAB — ETHANOL: Alcohol, Ethyl (B): <11 mg/dL (ref 0–11)

## 2012-03-01 LAB — SALICYLATE LEVEL: Salicylate Lvl: <2 mg/dL — ABNORMAL LOW (ref 2.8–20.0)

## 2012-03-01 LAB — VALPROIC ACID LEVEL: Valproic Acid Lvl: <10 ug/mL — ABNORMAL LOW (ref 50.0–100.0)

## 2012-03-01 LAB — GLUCOSE, CAPILLARY: Glucose-Capillary: 133 mg/dL — ABNORMAL HIGH (ref 70–99)

## 2012-03-01 LAB — COMPREHENSIVE METABOLIC PANEL
BUN: 23 mg/dL (ref 6–23)
CO2: 25 mEq/L (ref 19–32)
Chloride: 103 mEq/L (ref 96–112)
Creatinine, Ser: 1.73 mg/dL — ABNORMAL HIGH (ref 0.50–1.10)
GFR calc non Af Amer: 32 mL/min — ABNORMAL LOW (ref >90)
Total Bilirubin: 0.2 mg/dL — ABNORMAL LOW (ref 0.3–1.2)

## 2012-03-01 LAB — RAPID URINE DRUG SCREEN, HOSP PERFORMED: Amphetamines: NOT DETECTED

## 2012-03-01 MED ORDER — BRIMONIDINE TARTRATE 0.2 % OP SOLN
1.0000 [drp] | Freq: Three times a day (TID) | OPHTHALMIC | Status: DC
  Administered 2012-03-01 – 2012-03-03 (×9): 1 [drp] via OPHTHALMIC
  Filled 2012-03-01: qty 5

## 2012-03-01 MED ORDER — GLIPIZIDE 5 MG PO TABS
5.0000 mg | ORAL_TABLET | Freq: Every day | ORAL | Status: DC
  Administered 2012-03-01 – 2012-03-03 (×3): 5 mg via ORAL
  Filled 2012-03-01 (×4): qty 1

## 2012-03-01 MED ORDER — AMLODIPINE BESYLATE 5 MG PO TABS
5.0000 mg | ORAL_TABLET | Freq: Every day | ORAL | Status: DC
  Administered 2012-03-01 – 2012-03-03 (×3): 5 mg via ORAL
  Filled 2012-03-01 (×3): qty 1

## 2012-03-01 MED ORDER — BENAZEPRIL HCL 20 MG PO TABS
20.0000 mg | ORAL_TABLET | Freq: Every day | ORAL | Status: DC
  Administered 2012-03-01 – 2012-03-03 (×3): 20 mg via ORAL
  Filled 2012-03-01 (×3): qty 1

## 2012-03-01 MED ORDER — DIVALPROEX SODIUM ER 500 MG PO TB24
500.0000 mg | ORAL_TABLET | Freq: Every day | ORAL | Status: DC
  Administered 2012-03-01 – 2012-03-03 (×3): 500 mg via ORAL
  Filled 2012-03-01 (×3): qty 1

## 2012-03-01 MED ORDER — ALBUTEROL SULFATE HFA 108 (90 BASE) MCG/ACT IN AERS: 2.0000 | INHALATION_SPRAY | RESPIRATORY_TRACT | Status: DC | PRN

## 2012-03-01 MED ORDER — AMLODIPINE BESY-BENAZEPRIL HCL 5-20 MG PO CAPS: 1.0000 | ORAL_CAPSULE | Freq: Every day | ORAL | Status: DC

## 2012-03-01 MED ORDER — ATORVASTATIN CALCIUM 20 MG PO TABS
20.0000 mg | ORAL_TABLET | Freq: Every day | ORAL | Status: DC
Start: 2012-03-01 — End: 2012-03-03
  Administered 2012-03-01 – 2012-03-03 (×3): 20 mg via ORAL
  Filled 2012-03-01 (×3): qty 1

## 2012-03-01 MED ORDER — LATANOPROST 0.005 % OP SOLN
1.0000 [drp] | Freq: Every day | OPHTHALMIC | Status: DC
Start: 2012-03-01 — End: 2012-03-03
  Administered 2012-03-01 – 2012-03-03 (×3): 1 [drp] via OPHTHALMIC
  Filled 2012-03-01: qty 2.5

## 2012-03-01 MED ORDER — LEVOTHYROXINE SODIUM 200 MCG PO TABS
200.0000 ug | ORAL_TABLET | Freq: Every day | ORAL | Status: DC
  Administered 2012-03-01 – 2012-03-03 (×3): 200 ug via ORAL
  Filled 2012-03-01 (×3): qty 1

## 2012-03-01 MED ORDER — HYDROCHLOROTHIAZIDE 25 MG PO TABS
25.0000 mg | ORAL_TABLET | Freq: Every day | ORAL | Status: DC
  Administered 2012-03-01 – 2012-03-03 (×3): 25 mg via ORAL
  Filled 2012-03-01 (×3): qty 1

## 2012-03-01 MED ORDER — DULOXETINE HCL 60 MG PO CPEP
60.0000 mg | ORAL_CAPSULE | Freq: Every day | ORAL | Status: DC
  Administered 2012-03-01 – 2012-03-03 (×3): 60 mg via ORAL
  Filled 2012-03-01 (×3): qty 1

## 2012-03-01 MED ORDER — PIOGLITAZONE HCL 30 MG PO TABS
30.0000 mg | ORAL_TABLET | Freq: Every day | ORAL | Status: DC
  Administered 2012-03-01 – 2012-03-03 (×3): 30 mg via ORAL
  Filled 2012-03-01 (×3): qty 1

## 2012-03-01 MED ORDER — COLCHICINE 0.6 MG PO TABS
0.6000 mg | ORAL_TABLET | Freq: Every day | ORAL | Status: DC | PRN
  Filled 2012-03-01: qty 1

## 2012-03-01 MED ORDER — OXYCODONE HCL 5 MG PO TABS: 5.0000 mg | ORAL_TABLET | Freq: Four times a day (QID) | ORAL | Status: DC | PRN

## 2012-03-01 MED ORDER — BUSPIRONE HCL 10 MG PO TABS
15.0000 mg | ORAL_TABLET | Freq: Three times a day (TID) | ORAL | Status: DC
  Administered 2012-03-01 – 2012-03-03 (×8): 15 mg via ORAL
  Administered 2012-03-03: 20 mg via ORAL
  Filled 2012-03-01 (×4): qty 2
  Filled 2012-03-01: qty 1
  Filled 2012-03-01: qty 2
  Filled 2012-03-01: qty 1
  Filled 2012-03-01 (×3): qty 2

## 2012-03-01 NOTE — ED Notes (Signed)
Patient used phone. Wants a number from her personal clothing . Made her aware unable to retreive at this time.

## 2012-03-01 NOTE — ED Notes (Signed)
Pt alert and oriented x4. Respirations even and unlabored, bilateral symmetrical rise and fall of chest. Skin warm and dry. In no acute distress. Denies needs.   

## 2012-03-01 NOTE — ED Notes (Signed)
Pt up to bathroom. Telepsych completed.
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
## 2012-03-01 NOTE — ED Notes (Signed)
Pt served lunch. Told her writer will come talk to her shortly after she's done eating.

## 2012-03-01 NOTE — BHH Counselor (Signed)
[name], assessment counselor at [facility], submitted Pt for admission to [facility]. Rosey Bath, [facility] confirmed adult unit is at capacity. Gave clinical report to Jorje Guild, PA who accepted Pt to the service Dr. Henrietta Dine pending 300-hall bed availability. Notified Thornell Sartorius, assessment counselor at [facility], of disposition.  Harlin Rain Patsy Baltimore, LPC

## 2012-03-01 NOTE — ED Provider Notes (Signed)
The patient has vague suicidal ideation, associated with substance abuse and ongoing, chronic mental illness. He did not express active suicidal ideation. She does with pressured speech. She has rambling thoughts. I will arrange a tele-psychiatric consultation.  Flint Melter, MD 03/01/12 1025

## 2012-03-01 NOTE — ED Notes (Signed)
telepsych being done currently. Tech in the room.

## 2012-03-01 NOTE — ED Notes (Signed)
Pt transferred to rm 27. Sleeping soundly.

## 2012-03-01 NOTE — ED Notes (Signed)
Telepsych report back, taking a copy to EDP.

## 2012-03-02 NOTE — ED Notes (Signed)
Pt asked what medications she was being given. She said eye drops are for glaucoma.

## 2012-03-02 NOTE — ED Notes (Signed)
Pt is awake eating her breakfast

## 2012-03-02 NOTE — ED Notes (Signed)
Pt laying in bed but awake. Denies needs at this time.

## 2012-03-02 NOTE — ED Provider Notes (Signed)
7:33 AM Filed Vitals:   03/02/12 0639  BP: 142/82  Pulse: 55  Temp: 98 F (36.7 C)  Resp: 18   Accepted to [facility], awaiting 300 bed assignement. telepsych evaluated and recommending inpatient. No complaints this AM  Lyanne Co, MD 03/02/12 [phone]

## 2012-03-02 NOTE — ED Notes (Signed)
Pt up to bathroom.

## 2012-03-02 NOTE — ED Notes (Signed)
Gave patient popsiclce.

## 2012-03-02 NOTE — ED Notes (Signed)
Patient received call from [employer] her [employer].

## 2012-03-02 NOTE — ED Notes (Signed)
Patient is cooperative and pleasant - ambulated to bathroom .

## 2012-03-02 NOTE — ED Notes (Signed)
Patient has been asleep  at this time. Resting fine. No complaints

## 2012-03-02 NOTE — ED Notes (Signed)
Pt laying in her bed covered up, denies needs at this time

## 2012-03-03 ENCOUNTER — Inpatient Hospital Stay (HOSPITAL_COMMUNITY)
Admission: AD | Admit: 2012-03-03 | Discharge: 2012-03-12 | DRG: 881 | Disposition: A | Discharge status: Home / Self Care | Payer: 59 | Source: Ambulatory Visit | Attending: Psychiatry | Admitting: Psychiatry

## 2012-03-03 DIAGNOSIS — K5900 Constipation, unspecified: Secondary | ICD-10-CM | POA: Diagnosis present

## 2012-03-03 DIAGNOSIS — H409 Unspecified glaucoma: Secondary | ICD-10-CM | POA: Diagnosis present

## 2012-03-03 DIAGNOSIS — M129 Arthropathy, unspecified: Secondary | ICD-10-CM | POA: Diagnosis present

## 2012-03-03 DIAGNOSIS — F418 Other specified anxiety disorders: Secondary | ICD-10-CM

## 2012-03-03 DIAGNOSIS — M549 Dorsalgia, unspecified: Secondary | ICD-10-CM | POA: Diagnosis present

## 2012-03-03 DIAGNOSIS — J45909 Unspecified asthma, uncomplicated: Secondary | ICD-10-CM | POA: Diagnosis present

## 2012-03-03 DIAGNOSIS — E1149 Type 2 diabetes mellitus with other diabetic neurological complication: Secondary | ICD-10-CM | POA: Diagnosis present

## 2012-03-03 DIAGNOSIS — I10 Essential (primary) hypertension: Secondary | ICD-10-CM | POA: Diagnosis present

## 2012-03-03 DIAGNOSIS — F1410 Cocaine abuse, uncomplicated: Secondary | ICD-10-CM | POA: Diagnosis present

## 2012-03-03 DIAGNOSIS — J039 Acute tonsillitis, unspecified: Secondary | ICD-10-CM | POA: Diagnosis present

## 2012-03-03 DIAGNOSIS — Z888 Allergy status to other drugs, medicaments and biological substances status: Secondary | ICD-10-CM

## 2012-03-03 DIAGNOSIS — K859 Acute pancreatitis without necrosis or infection, unspecified: Secondary | ICD-10-CM

## 2012-03-03 DIAGNOSIS — F209 Schizophrenia, unspecified: Secondary | ICD-10-CM

## 2012-03-03 DIAGNOSIS — N179 Acute kidney failure, unspecified: Secondary | ICD-10-CM

## 2012-03-03 DIAGNOSIS — F19939 Other psychoactive substance use, unspecified with withdrawal, unspecified: Secondary | ICD-10-CM | POA: Diagnosis present

## 2012-03-03 DIAGNOSIS — E785 Hyperlipidemia, unspecified: Secondary | ICD-10-CM | POA: Diagnosis present

## 2012-03-03 DIAGNOSIS — Z79899 Other long term (current) drug therapy: Secondary | ICD-10-CM

## 2012-03-03 DIAGNOSIS — IMO0002 Reserved for concepts with insufficient information to code with codable children: Secondary | ICD-10-CM | POA: Diagnosis present

## 2012-03-03 DIAGNOSIS — R45850 Homicidal ideations: Secondary | ICD-10-CM

## 2012-03-03 DIAGNOSIS — M109 Gout, unspecified: Secondary | ICD-10-CM | POA: Diagnosis present

## 2012-03-03 DIAGNOSIS — G8929 Other chronic pain: Secondary | ICD-10-CM

## 2012-03-03 DIAGNOSIS — Z794 Long term (current) use of insulin: Secondary | ICD-10-CM

## 2012-03-03 DIAGNOSIS — E039 Hypothyroidism, unspecified: Secondary | ICD-10-CM | POA: Diagnosis present

## 2012-03-03 DIAGNOSIS — E119 Type 2 diabetes mellitus without complications: Secondary | ICD-10-CM | POA: Diagnosis present

## 2012-03-03 DIAGNOSIS — F1420 Cocaine dependence, uncomplicated: Secondary | ICD-10-CM | POA: Diagnosis present

## 2012-03-03 DIAGNOSIS — E875 Hyperkalemia: Secondary | ICD-10-CM | POA: Diagnosis not present

## 2012-03-03 DIAGNOSIS — Z8673 Personal history of transient ischemic attack (TIA), and cerebral infarction without residual deficits: Secondary | ICD-10-CM

## 2012-03-03 DIAGNOSIS — G609 Hereditary and idiopathic neuropathy, unspecified: Secondary | ICD-10-CM | POA: Diagnosis present

## 2012-03-03 DIAGNOSIS — R45851 Suicidal ideations: Secondary | ICD-10-CM

## 2012-03-03 DIAGNOSIS — G629 Polyneuropathy, unspecified: Secondary | ICD-10-CM

## 2012-03-03 DIAGNOSIS — I129 Hypertensive chronic kidney disease with stage 1 through stage 4 chronic kidney disease, or unspecified chronic kidney disease: Secondary | ICD-10-CM | POA: Diagnosis present

## 2012-03-03 DIAGNOSIS — F3289 Other specified depressive episodes: Principal | ICD-10-CM | POA: Diagnosis present

## 2012-03-03 DIAGNOSIS — F329 Major depressive disorder, single episode, unspecified: Principal | ICD-10-CM | POA: Diagnosis present

## 2012-03-03 DIAGNOSIS — N189 Chronic kidney disease, unspecified: Secondary | ICD-10-CM | POA: Diagnosis present

## 2012-03-03 LAB — GLUCOSE, CAPILLARY: Glucose-Capillary: 133 mg/dL — ABNORMAL HIGH (ref 70–99)

## 2012-03-03 NOTE — ED Notes (Signed)
Patient is asleep.  

## 2012-03-03 NOTE — ED Provider Notes (Signed)
No new c/o  Donnetta Hutching, MD 03/03/12 [phone]

## 2012-03-03 NOTE — ED Provider Notes (Signed)
Alert, pleasnt ambultory, coopertive. Stable for transfer to [facility]  Doug Sou, MD 03/03/12 2122

## 2012-03-03 NOTE — ED Notes (Signed)
Left dept ambulatory w/ security enroute to [facility]. NAD at time of departure.

## 2012-03-04 ENCOUNTER — Encounter (HOSPITAL_COMMUNITY): Payer: Self-pay | Admitting: Behavioral Health

## 2012-03-04 DIAGNOSIS — F332 Major depressive disorder, recurrent severe without psychotic features: Secondary | ICD-10-CM

## 2012-03-04 DIAGNOSIS — F1910 Other psychoactive substance abuse, uncomplicated: Secondary | ICD-10-CM

## 2012-03-04 LAB — URINALYSIS, ROUTINE W REFLEX MICROSCOPIC
Glucose, UA: NEGATIVE mg/dL
Hgb urine dipstick: NEGATIVE
Specific Gravity, Urine: 1.02 (ref 1.005–1.030)
Urobilinogen, UA: 0.2 mg/dL (ref 0.0–1.0)

## 2012-03-04 LAB — URINE MICROSCOPIC-ADD ON

## 2012-03-04 LAB — HEMOGLOBIN A1C
Hgb A1c MFr Bld: 7.9 % — ABNORMAL HIGH (ref <5.7)
Mean Plasma Glucose: 180 mg/dL — ABNORMAL HIGH (ref <117)

## 2012-03-04 LAB — GLUCOSE, CAPILLARY
Glucose-Capillary: 133 mg/dL — ABNORMAL HIGH (ref 70–99)
Glucose-Capillary: 137 mg/dL — ABNORMAL HIGH (ref 70–99)
Glucose-Capillary: 192 mg/dL — ABNORMAL HIGH (ref 70–99)
Glucose-Capillary: 129 mg/dL — ABNORMAL HIGH (ref 70–99)

## 2012-03-04 LAB — LIPID PANEL
HDL: 48 mg/dL (ref >39)
LDL Cholesterol: 115 mg/dL — ABNORMAL HIGH (ref 0–99)
Total CHOL/HDL Ratio: 4.1 RATIO

## 2012-03-04 MED ORDER — ATORVASTATIN CALCIUM 20 MG PO TABS
20.0000 mg | ORAL_TABLET | Freq: Every day | ORAL | Status: DC
  Administered 2012-03-04 – 2012-03-12 (×9): 20 mg via ORAL
  Filled 2012-03-04 (×10): qty 1

## 2012-03-04 MED ORDER — LISINOPRIL 40 MG PO TABS
40.0000 mg | ORAL_TABLET | Freq: Every day | ORAL | Status: DC
  Filled 2012-03-04: qty 1

## 2012-03-04 MED ORDER — AMLODIPINE BESYLATE 5 MG PO TABS
5.0000 mg | ORAL_TABLET | Freq: Every day | ORAL | Status: DC
  Administered 2012-03-04 – 2012-03-06 (×3): 5 mg via ORAL
  Filled 2012-03-04 (×4): qty 1

## 2012-03-04 MED ORDER — ALBUTEROL SULFATE HFA 108 (90 BASE) MCG/ACT IN AERS: 2.0000 | INHALATION_SPRAY | Freq: Four times a day (QID) | RESPIRATORY_TRACT | Status: DC | PRN

## 2012-03-04 MED ORDER — DULOXETINE HCL 60 MG PO CPEP
60.0000 mg | ORAL_CAPSULE | Freq: Every day | ORAL | Status: DC
  Administered 2012-03-04 – 2012-03-06 (×3): 60 mg via ORAL
  Filled 2012-03-04 (×6): qty 1

## 2012-03-04 MED ORDER — BUSPIRONE HCL 10 MG PO TABS
15.0000 mg | ORAL_TABLET | Freq: Three times a day (TID) | ORAL | Status: DC
Start: 2012-03-04 — End: 2012-03-12
  Administered 2012-03-04 – 2012-03-07 (×10): 15 mg via ORAL
  Administered 2012-03-07: 5 mg via ORAL
  Administered 2012-03-07 – 2012-03-12 (×14): 15 mg via ORAL
  Filled 2012-03-04 (×28): qty 1

## 2012-03-04 MED ORDER — SERTRALINE HCL 100 MG PO TABS
100.0000 mg | ORAL_TABLET | Freq: Three times a day (TID) | ORAL | Status: DC
  Administered 2012-03-04: 100 mg via ORAL
  Filled 2012-03-04 (×4): qty 1

## 2012-03-04 MED ORDER — TRAZODONE HCL 50 MG PO TABS
50.0000 mg | ORAL_TABLET | Freq: Every evening | ORAL | Status: DC | PRN
  Administered 2012-03-04 – 2012-03-05 (×2): 50 mg via ORAL
  Filled 2012-03-04 (×7): qty 1

## 2012-03-04 MED ORDER — BENAZEPRIL HCL 20 MG PO TABS
20.0000 mg | ORAL_TABLET | Freq: Every day | ORAL | Status: DC
  Administered 2012-03-04 – 2012-03-09 (×6): 20 mg via ORAL
  Filled 2012-03-04 (×8): qty 1

## 2012-03-04 MED ORDER — ASPIRIN EC 81 MG PO TBEC
81.0000 mg | DELAYED_RELEASE_TABLET | Freq: Every day | ORAL | Status: DC
  Administered 2012-03-04 – 2012-03-12 (×9): 81 mg via ORAL
  Filled 2012-03-04 (×10): qty 1

## 2012-03-04 MED ORDER — DIVALPROEX SODIUM ER 500 MG PO TB24
500.0000 mg | ORAL_TABLET | Freq: Every day | ORAL | Status: DC
  Administered 2012-03-04 – 2012-03-07 (×4): 500 mg via ORAL
  Filled 2012-03-04 (×7): qty 1

## 2012-03-04 MED ORDER — LATANOPROST 0.005 % OP SOLN
1.0000 [drp] | Freq: Every day | OPHTHALMIC | Status: DC
  Administered 2012-03-04 – 2012-03-11 (×8): 1 [drp] via OPHTHALMIC
  Filled 2012-03-04: qty 2.5

## 2012-03-04 MED ORDER — INSULIN ASPART 100 UNIT/ML ~~LOC~~ SOLN
0.0000 [IU] | Freq: Three times a day (TID) | SUBCUTANEOUS | Status: DC
  Administered 2012-03-04: 2 [IU] via SUBCUTANEOUS
  Administered 2012-03-04 – 2012-03-05 (×4): 3 [IU] via SUBCUTANEOUS
  Administered 2012-03-05 – 2012-03-06 (×2): 2 [IU] via SUBCUTANEOUS
  Administered 2012-03-06: 3 [IU] via SUBCUTANEOUS
  Administered 2012-03-06: 5 [IU] via SUBCUTANEOUS
  Administered 2012-03-07: 3 [IU] via SUBCUTANEOUS
  Administered 2012-03-07: 2 [IU] via SUBCUTANEOUS
  Administered 2012-03-07: 5 [IU] via SUBCUTANEOUS
  Administered 2012-03-08: 2 [IU] via SUBCUTANEOUS
  Administered 2012-03-08 (×2): 5 [IU] via SUBCUTANEOUS
  Administered 2012-03-09: 8 [IU] via SUBCUTANEOUS
  Administered 2012-03-09: 2 [IU] via SUBCUTANEOUS
  Administered 2012-03-10: 15 [IU] via SUBCUTANEOUS
  Administered 2012-03-10: 3 [IU] via SUBCUTANEOUS
  Administered 2012-03-11: 8 [IU] via SUBCUTANEOUS
  Administered 2012-03-11: 2 [IU] via SUBCUTANEOUS
  Administered 2012-03-11 – 2012-03-12 (×2): 3 [IU] via SUBCUTANEOUS
  Administered 2012-03-12: 2 [IU] via SUBCUTANEOUS

## 2012-03-04 MED ORDER — AMLODIPINE BESY-BENAZEPRIL HCL 5-20 MG PO CAPS: 1.0000 | ORAL_CAPSULE | Freq: Every day | ORAL | Status: DC

## 2012-03-04 MED ORDER — ALLOPURINOL 100 MG PO TABS
100.0000 mg | ORAL_TABLET | Freq: Every day | ORAL | Status: DC
  Administered 2012-03-04 – 2012-03-09 (×6): 100 mg via ORAL
  Filled 2012-03-04 (×8): qty 1

## 2012-03-04 MED ORDER — INSULIN ASPART 100 UNIT/ML ~~LOC~~ SOLN
0.0000 [IU] | Freq: Every day | SUBCUTANEOUS | Status: DC
  Administered 2012-03-09: 3 [IU] via SUBCUTANEOUS
  Administered 2012-03-10: 4 [IU] via SUBCUTANEOUS

## 2012-03-04 MED ORDER — FENOFIBRATE 54 MG PO TABS: 54.0000 mg | ORAL_TABLET | Freq: Every day | ORAL | Status: DC

## 2012-03-04 MED ORDER — BRIMONIDINE TARTRATE 0.2 % OP SOLN
1.0000 [drp] | Freq: Two times a day (BID) | OPHTHALMIC | Status: DC
  Administered 2012-03-04 – 2012-03-12 (×17): 1 [drp] via OPHTHALMIC
  Filled 2012-03-04: qty 5

## 2012-03-04 MED ORDER — LINAGLIPTIN 5 MG PO TABS
5.0000 mg | ORAL_TABLET | Freq: Every day | ORAL | Status: DC
  Administered 2012-03-04 – 2012-03-12 (×9): 5 mg via ORAL
  Filled 2012-03-04 (×10): qty 1

## 2012-03-04 MED ORDER — GEMFIBROZIL 600 MG PO TABS
600.0000 mg | ORAL_TABLET | Freq: Two times a day (BID) | ORAL | Status: DC
  Administered 2012-03-04 – 2012-03-12 (×17): 600 mg via ORAL
  Filled 2012-03-04 (×20): qty 1

## 2012-03-04 MED ORDER — LEVOTHYROXINE SODIUM 200 MCG PO TABS
200.0000 ug | ORAL_TABLET | Freq: Every day | ORAL | Status: DC
  Administered 2012-03-04 – 2012-03-10 (×7): 200 ug via ORAL
  Filled 2012-03-04 (×9): qty 1

## 2012-03-04 MED ORDER — PANTOPRAZOLE SODIUM 40 MG PO TBEC
40.0000 mg | DELAYED_RELEASE_TABLET | Freq: Every day | ORAL | Status: DC
  Administered 2012-03-04 – 2012-03-12 (×9): 40 mg via ORAL
  Filled 2012-03-04 (×10): qty 1

## 2012-03-04 MED ORDER — COLCHICINE 0.6 MG PO TABS: 0.6000 mg | ORAL_TABLET | Freq: Four times a day (QID) | ORAL | Status: DC | PRN

## 2012-03-04 MED ORDER — PIOGLITAZONE HCL 30 MG PO TABS
30.0000 mg | ORAL_TABLET | Freq: Every day | ORAL | Status: DC
  Administered 2012-03-04 – 2012-03-12 (×9): 30 mg via ORAL
  Filled 2012-03-04 (×10): qty 1

## 2012-03-04 MED ORDER — NICOTINE 21 MG/24HR TD PT24
21.0000 mg | MEDICATED_PATCH | Freq: Every day | TRANSDERMAL | Status: DC
  Administered 2012-03-04 – 2012-03-12 (×9): 21 mg via TRANSDERMAL
  Filled 2012-03-04 (×12): qty 1

## 2012-03-04 MED ORDER — ALUM & MAG HYDROXIDE-SIMETH 200-200-20 MG/5ML PO SUSP: 30.0000 mL | ORAL | Status: DC | PRN

## 2012-03-04 MED ORDER — HYDROCHLOROTHIAZIDE 25 MG PO TABS
25.0000 mg | ORAL_TABLET | Freq: Every day | ORAL | Status: DC
  Administered 2012-03-04 – 2012-03-09 (×6): 25 mg via ORAL
  Filled 2012-03-04 (×8): qty 1

## 2012-03-04 MED ORDER — GLIPIZIDE 5 MG PO TABS
5.0000 mg | ORAL_TABLET | Freq: Every day | ORAL | Status: DC
Start: 2012-03-04 — End: 2012-03-12
  Administered 2012-03-04 – 2012-03-12 (×9): 5 mg via ORAL
  Filled 2012-03-04 (×12): qty 1

## 2012-03-04 MED ORDER — MAGNESIUM HYDROXIDE 400 MG/5ML PO SUSP
30.0000 mL | Freq: Every day | ORAL | Status: DC | PRN
  Administered 2012-03-09: 30 mL via ORAL

## 2012-03-04 MED ORDER — BRIMONIDINE TARTRATE 0.1 % OP SOLN: 1.0000 [drp] | Freq: Every day | OPHTHALMIC | Status: DC

## 2012-03-04 NOTE — Tx Team (Signed)
Interdisciplinary Treatment Plan Update (Adult)  Date:  03/04/2012  Time Reviewed:  10:11 AM   Progress in Treatment: Attending groups: Yes Participating in groups:  Yes Taking medication as prescribed: Yes Tolerating medication:  Yes Family/Significant othe contact made:  Counselor assessing for appropriate contact Patient understands diagnosis:  Yes Discussing patient identified problems/goals with staff:  Yes Medical problems stabilized or resolved:  Yes Denies suicidal/homicidal ideation: Yes Issues/concerns per patient self-inventory:  None identified Other: N/A  New problem(s) identified: None Identified  Reason for Continuation of Hospitalization: Anxiety Depression Medication stabilization Suicidal ideation  Interventions implemented related to continuation of hospitalization: mood stabilization, medication monitoring and adjustment, group therapy and psycho education, safety checks q 15 mins  Additional comments: N/A  Estimated length of stay: 3-5 days  Discharge Plan: SW will assess for appropriate referrals.    New goal(s): N/A  Review of initial/current patient goals per problem list:    1.  Goal(s): Address substance use  Met:  No  Target date: by discharge  As evidenced by: completing detox protocol and refer to appropriate treatment  2.  Goal (s): Reduce depressive symptoms  Met:  No  Target date: by discharge  As evidenced by: Reducing depression from a 10 to a 3 as reported by pt.    3.  Goal(s): Eliminate SI  Met:  No  Target date: by discharge  As evidenced by: pt denying SI   Attendees: Patient:  [name]  03/04/2012 10:11 AM   Family:     Physician: Patrick North, MD 03/04/2012 10:11 AM   Nursing: Edwyna Shell, RN 03/04/2012 10:11 AM   Case Manager:  Reyes Ivan, LCSWA 03/04/2012  10:11 AM   Counselor:  Ronda Fairly, LCSWA 03/04/2012  10:11 AM   Other:  Leighton Parody, RN 03/04/2012 10:12 AM   Other:  Jorje Guild, PA  03/04/2012 10:12 AM   Other:     Other:      Scribe for Treatment Team:   Reyes Ivan 03/04/2012 10:11 AM
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
## 2012-03-04 NOTE — Progress Notes (Signed)
BHH Group Notes:  (Counselor/Nursing/MHT/Case Management/Adjunct)    Type of Therapy:  Group Therapy  Participation Level:  Active  Participation Quality:  Appropriate and Attentive  Affect:  Appropriate  Cognitive:  Alert and Oriented  Insight:  Limited  Engagement in Group:  Good  Engagement in Therapy:  Limited  Modes of Intervention:  Clarification, Education, Socialization and Support  Summary of Progress/Problems: Patient attended group presentation by staff member of  [facility] [place] ([facility]). [name] was attentive and appropriate during session.  She shared about how having a peer support person has helped her in past and she believes she would not be here now if she had had one these last few months &/or weeks.   [name] 03/04/2012 2:58 PM

## 2012-03-04 NOTE — Tx Team (Signed)
Initial Interdisciplinary Treatment Plan  PATIENT STRENGTHS: (choose at least two) Ability for insight Average or above average intelligence Capable of independent living Communication skills General fund of knowledge Religious Affiliation Special hobby/interest Supportive family/friends  PATIENT STRESSORS: Financial difficulties Marital or family conflict Substance abuse   PROBLEM LIST: Problem List/Patient Goals Date to be addressed Date deferred Reason deferred Estimated date of resolution  Suicidal Ideation      Substance Abuse                                                 DISCHARGE CRITERIA:  Ability to meet basic life and health needs Adequate post-discharge living arrangements Medical problems require only outpatient monitoring Motivation to continue treatment in a less acute level of care Need for constant or close observation no longer present Withdrawal symptoms are absent or subacute and managed without 24-hour nursing intervention  PRELIMINARY DISCHARGE PLAN: Attend aftercare/continuing care group Outpatient therapy Return to previous living arrangement  PATIENT/FAMIILY INVOLVEMENT: This treatment plan has been presented to and reviewed with the patient, [name], and/or family member  The patient and family have been given the opportunity to ask questions and make suggestions.  Leda Quail T 03/04/2012, 12:40 AM

## 2012-03-04 NOTE — Progress Notes (Signed)
Psychoeducational Group Note  Date:  03/04/2012 Time:  1000  Group Topic/Focus:  Recovery Goals:   The focus of this group is to identify appropriate goals for recovery and establish a plan to achieve them. Recovery Goals Activity  Participation Level:  Active  Participation Quality:  Appropriate, Attentive and Sharing  Affect:  Appropriate  Cognitive:  Appropriate  Insight:  Good  Engagement in Group:  Good  Additional Comments:  Pt participated in Recovery Goals group. Pt stated the positive things in life that contribute to positive road of recovery. Pt stated that being able to attend church was something helpful in staying on path of recovery. Pt participated in activity with writing a goal on leaf or butterfly on positive thing that contributes to recovery, leaf or butterfly was placed on tree of 300 hallway.  [name] 03/04/2012, 11:46 AM
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
## 2012-03-04 NOTE — Progress Notes (Signed)
Patient ID: [name], female   DOB: Oct 04, 1955, 56 y.o.   MRN: [id number]  Pt admitted. Pt attempted to overdose on Cymbalta, Zoloft, and Vistaril. Pt stated "My boyfriend makes me so mad." "He is stupid." "I have a lot going on right now and everything just built up." When asked when was the last time she used cocaine pt stated "sometime last week". "I only use once every 6 months". She reported cocaine use last night to a previous staff member. She stated that she is unhappy with her boyfriend and his spending. She stated "I got rid of him". When asked to clarify she stated "I broke up with him". Pt currently denies SI/HI at this time. Pt states that she is at peace. Pt also currently denies A/V hallucinations at this time.   Medical history reviewed with patient. Pt explained [facility] policies. Pt explained need to lock up some personal belongings (locker 13).  Pt verbalized understanding.

## 2012-03-04 NOTE — Progress Notes (Signed)
Pt attended discharge planning group and actively participated in group.  SW provided pt with today's workbook.  Pt presents with flat affect and depressed mood.  Pt rates depression at a 9 and anxiety at an 8 today.  Pt denies SI/HI today.  Pt states that she came to the hospital due to depression and a suicide attempt by overdose.  Pt states that she has a lot of stress at home.  Pt states that she used cocaine before admission and only uses every once in awhile.  Pt states that she lives in [place] alone.  Pt states that she went to [facility] a month ago and hasn't followed up with an outpatient provider for medication management therapy.  SW will assess for appropriate referrals.  No further needs voiced by pt at this time.    Reyes Ivan, LCSWA 03/04/2012  10:10 AM

## 2012-03-04 NOTE — [facility] Suicide Risk Assessment (Signed)
Suicide Risk Assessment  Admission Assessment     Nursing information obtained from:    Demographic factors:  Low socioeconomic status Current Mental Status:  Suicidal ideation indicated by patient;Suicide plan;Self-harm thoughts;Self-harm behaviors;Thoughts of violence towards others;Intention to act on suicide plan Loss Factors:  Loss of significant relationship;Financial problems / change in socioeconomic status Historical Factors:  Prior suicide attempts Risk Reduction Factors:  Sense of responsibility to family;Positive social support  CLINICAL FACTORS:   Depression:   Anhedonia Impulsivity  COGNITIVE FEATURES THAT CONTRIBUTE TO RISK:  Cognitively intact  SUICIDE RISK:   Mild:  Suicidal ideation of limited frequency, intensity, duration, and specificity.  There are no identifiable plans, no associated intent, mild dysphoria and related symptoms, good self-control (both objective and subjective assessment), few other risk factors, and identifiable protective factors, including available and accessible social support.  PLAN OF CARE: Reinstate her home medications. Continue to monitor.   [name] 03/04/2012, 10:11 AM

## 2012-03-04 NOTE — H&P (Signed)
Patient seen and assessed. Agree with above assessment and plan. Since patient came in on multiple medications that could contribute to increased irritability, will discontinue the Zoloft and monitor. Medication changes as needed for mood and other medical conditions.

## 2012-03-04 NOTE — Progress Notes (Addendum)
D:  Patient admitted after trying to kill herself by overdosing on medications "she took 2 of each of her medications" & wanting to kill her boyfriend. Pt smokes Crack Cocaine q 6 mths, but smoked some this time because she got "overwhelmed and stressed out".  Patient appears sad and depressed. Patient is very cooperative .  Patient has passive SI . No HI/ AVH at this time. Patient complained of back pain earlier, but did not mention back pain after morning  Meds.Pt stated she slept 3 hrs last night, which was an improvement . Her appetite has been improving while her energy level was on the low side today. Pt depression was rated 7 out of 10 and hopeless feelings 9 out of 10. Pt states" doesn't know what she is going to do when she gets out", "she's on disability and doesn't have anywhere to go".       A: Safety  maintained with Q15 min  checks. Support and encouragement provided. Medication given for Pain, depression, HTN, and anxiety. Pt was asked to contract for safety( come to staff if having feelings of hurting herself)  R: Patient remains safe. She is complaint with medication. Safety has been maintained Q15 and continue current POC. Pt states her anxiety is down to a 6, and feels better as the day goes on. Pt. contracted for safety.

## 2012-03-05 DIAGNOSIS — F411 Generalized anxiety disorder: Secondary | ICD-10-CM

## 2012-03-05 DIAGNOSIS — F1994 Other psychoactive substance use, unspecified with psychoactive substance-induced mood disorder: Secondary | ICD-10-CM

## 2012-03-05 LAB — GLUCOSE, CAPILLARY
Glucose-Capillary: 137 mg/dL — ABNORMAL HIGH (ref 70–99)
Glucose-Capillary: 153 mg/dL — ABNORMAL HIGH (ref 70–99)
Glucose-Capillary: 168 mg/dL — ABNORMAL HIGH (ref 70–99)

## 2012-03-05 MED ORDER — HYDROXYZINE HCL 25 MG PO TABS
25.0000 mg | ORAL_TABLET | Freq: Two times a day (BID) | ORAL | Status: DC | PRN
  Administered 2012-03-05 – 2012-03-10 (×5): 25 mg via ORAL
  Filled 2012-03-05 (×2): qty 1

## 2012-03-05 MED ORDER — NAPROXEN 500 MG PO TABS: 500.0000 mg | ORAL_TABLET | Freq: Two times a day (BID) | ORAL | Status: DC | PRN

## 2012-03-05 MED ORDER — NAPROXEN 500 MG PO TABS
500.0000 mg | ORAL_TABLET | Freq: Two times a day (BID) | ORAL | Status: DC
  Administered 2012-03-05 – 2012-03-09 (×10): 500 mg via ORAL
  Filled 2012-03-05 (×14): qty 1

## 2012-03-05 NOTE — Progress Notes (Signed)
Patient resting quietly with eyes closed. Respirations even and unlabored. No distress noted. Q 15 minute check continue as ordered to maintain safety.

## 2012-03-05 NOTE — Progress Notes (Signed)
[facility] MD Progress Note  03/05/2012 10:50 AM  Diagnosis:   Axis I: Generalized Anxiety Disorder, Major Depression, Recurrent severe, Substance Abuse and Substance Induced Mood Disorder Axis II: Deferred Axis III:  Past Medical History  Diagnosis Date  . Asthma     past 10 years  . Arthritis     djd -lower back  . Diabetes mellitus     past 5 years  . Glaucoma(365)     dx 06/2011-[facility]  . Hypertension   . Hyperlipidemia   . Hyperthyroidism   . History of TIA (transient ischemic attack)     01/06/2011  . Pancreatitis   . Degenerative disc disease   . Gout   . Depression    Subjective: [name] is lying in bed this morning complaining of low back pain. She endorses a poor appetite, and poor sleep. She states "I'm feeling really down today." She rates her depression as a 9/10, and her anxiety as a 9/10. She denies any withdrawal or cravings, and states that she is only an occasional cocaine user. She reports that she had suicidal thoughts earlier this morning, but had no plan or intent. She denies any homicidal ideation. She reports that last night she had auditory hallucinations of someone calling her name.  ADL's:  Intact  Sleep: Fair  Appetite:  Poor  Suicidal Ideation:  Patient endorses thoughts, but no plan or intent. Homicidal Ideation:  Patient denies any thought, plan, or intent  AEB (as evidenced by):  Mental Status Examination/Evaluation: Objective:  Appearance: Disheveled  Eye Contact::  Poor  Speech:  Clear and Coherent  Volume:  Decreased  Mood:  Depressed and Hopeless  Affect:  Congruent  Thought Process:  Logical  Orientation:  Full  Thought Content:  Hallucinations: Auditory  Suicidal Thoughts:  Yes.  without intent/plan  Homicidal Thoughts:  No  Memory:  Immediate;   Good Recent;   Fair Remote;   Fair  Judgement:  Fair  Insight:  Lacking  Psychomotor Activity:  Psychomotor Retardation  Concentration:  Fair  Recall:  Fair  Akathisia:   No  Handed:    AIMS (if indicated):     Assets:    Sleep:  Number of Hours: 6.75    Vital Signs:Blood pressure 117/83, pulse 76, temperature 97.4 F (36.3 C), temperature source Oral, resp. rate 16, height 4' 10.75" (1.492 m), weight 85.276 kg (188 lb). Current Medications: Current Facility-Administered Medications  Medication Dose Route Frequency Provider Last Rate Last Dose  . albuterol (PROVENTIL HFA;VENTOLIN HFA) 108 (90 BASE) MCG/ACT inhaler 2 puff  2 puff Inhalation Q6H PRN Kerry Hough, PA      . allopurinol (ZYLOPRIM) tablet 100 mg  100 mg Oral Daily Kerry Hough, PA   100 mg at 03/05/12 0817  . alum & mag hydroxide-simeth (MAALOX/MYLANTA) 200-200-20 MG/5ML suspension 30 mL  30 mL Oral Q4H PRN Kerry Hough, PA      . amLODipine (NORVASC) tablet 5 mg  5 mg Oral Daily Himabindu Ravi, MD   5 mg at 03/05/12 0817   And  . benazepril (LOTENSIN) tablet 20 mg  20 mg Oral Daily Himabindu Ravi, MD   20 mg at 03/05/12 0817  . aspirin EC tablet 81 mg  81 mg Oral Daily Kerry Hough, PA   81 mg at 03/05/12 0817  . atorvastatin (LIPITOR) tablet 20 mg  20 mg Oral Daily Kerry Hough, PA   20 mg at 03/05/12 0816  . brimonidine (ALPHAGAN)  0.2 % ophthalmic solution 1 drop  1 drop Both Eyes BID Kerry Hough, PA   1 drop at 03/05/12 0817  . busPIRone (BUSPAR) tablet 15 mg  15 mg Oral TID Kerry Hough, PA   15 mg at 03/05/12 0817  . colchicine tablet 0.6 mg  0.6 mg Oral QID PRN Kerry Hough, PA      . divalproex (DEPAKOTE ER) 24 hr tablet 500 mg  500 mg Oral QHS Kerry Hough, PA   500 mg at 03/04/12 2142  . DULoxetine (CYMBALTA) DR capsule 60 mg  60 mg Oral QHS Kerry Hough, PA   60 mg at 03/04/12 2142  . gemfibrozil (LOPID) tablet 600 mg  600 mg Oral BID AC Kerry Hough, PA   600 mg at 03/05/12 1610  . glipiZIDE (GLUCOTROL) tablet 5 mg  5 mg Oral QAC breakfast Kerry Hough, PA   5 mg at 03/05/12 9604  . hydrochlorothiazide (HYDRODIURIL) tablet 25 mg  25 mg Oral Daily  Kerry Hough, PA   25 mg at 03/05/12 0817  . hydrOXYzine (ATARAX/VISTARIL) tablet 25 mg  25 mg Oral BID PRN Himabindu Ravi, MD      . insulin aspart (novoLOG) injection 0-15 Units  0-15 Units Subcutaneous TID WC Kerry Hough, PA   2 Units at 03/05/12 0654  . insulin aspart (novoLOG) injection 0-5 Units  0-5 Units Subcutaneous QHS Kerry Hough, PA      . latanoprost (XALATAN) 0.005 % ophthalmic solution 1 drop  1 drop Both Eyes QHS Kerry Hough, PA   1 drop at 03/04/12 2141  . levothyroxine (SYNTHROID, LEVOTHROID) tablet 200 mcg  200 mcg Oral Daily Kerry Hough, PA   200 mcg at 03/05/12 0817  . linagliptin (TRADJENTA) tablet 5 mg  5 mg Oral Daily Kerry Hough, PA   5 mg at 03/05/12 0816  . magnesium hydroxide (MILK OF MAGNESIA) suspension 30 mL  30 mL Oral Daily PRN Kerry Hough, PA      . naproxen (NAPROSYN) tablet 500 mg  500 mg Oral BID WC Jorje Guild, PA-C      . nicotine (NICODERM CQ - dosed in mg/24 hours) patch 21 mg  21 mg Transdermal Q0600 Kerry Hough, PA   21 mg at 03/05/12 0631  . pantoprazole (PROTONIX) EC tablet 40 mg  40 mg Oral Daily Kerry Hough, PA   40 mg at 03/05/12 0817  . pioglitazone (ACTOS) tablet 30 mg  30 mg Oral Daily Kerry Hough, PA   30 mg at 03/05/12 0817  . traZODone (DESYREL) tablet 50 mg  50 mg Oral QHS,MR X 1 Kerry Hough, PA   50 mg at 03/04/12 2142  . DISCONTD: naproxen (NAPROSYN) tablet 500 mg  500 mg Oral BID PRN Himabindu Ravi, MD        Lab Results:  Results for orders placed during the hospital encounter of 03/03/12 (from the past 48 hour(s))  GLUCOSE, CAPILLARY     Status: Abnormal   Collection Time   03/04/12  1:16 AM      Component Value Range Comment   Glucose-Capillary 133 (*) 70 - 99 mg/dL   GLUCOSE, CAPILLARY     Status: Abnormal   Collection Time   03/04/12  6:02 AM      Component Value Range Comment   Glucose-Capillary 137 (*) 70 - 99 mg/dL   HEMOGLOBIN V4U     Status: Abnormal  Collection Time   03/04/12   6:21 AM      Component Value Range Comment   Hemoglobin A1C 7.9 (*) <5.7 %    Mean Plasma Glucose 180 (*) <117 mg/dL   LIPID PANEL     Status: Abnormal   Collection Time   03/04/12  6:21 AM      Component Value Range Comment   Cholesterol 196  0 - 200 mg/dL    Triglycerides 161 (*) <150 mg/dL    HDL 48  >09 mg/dL    Total CHOL/HDL Ratio 4.1      VLDL 33  0 - 40 mg/dL    LDL Cholesterol 604 (*) 0 - 99 mg/dL   TSH     Status: Abnormal   Collection Time   03/04/12  6:21 AM      Component Value Range Comment   TSH 14.723 (*) 0.350 - 4.500 uIU/mL   URINALYSIS, ROUTINE W REFLEX MICROSCOPIC     Status: Abnormal   Collection Time   03/04/12 10:00 AM      Component Value Range Comment   Color, Urine YELLOW  YELLOW    APPearance CLOUDY (*) CLEAR    Specific Gravity, Urine 1.020  1.005 - 1.030    pH 5.5  5.0 - 8.0    Glucose, UA NEGATIVE  NEGATIVE mg/dL    Hgb urine dipstick NEGATIVE  NEGATIVE    Bilirubin Urine NEGATIVE  NEGATIVE    Ketones, ur NEGATIVE  NEGATIVE mg/dL    Protein, ur NEGATIVE  NEGATIVE mg/dL    Urobilinogen, UA 0.2  0.0 - 1.0 mg/dL    Nitrite NEGATIVE  NEGATIVE    Leukocytes, UA MODERATE (*) NEGATIVE   URINE MICROSCOPIC-ADD ON     Status: Abnormal   Collection Time   03/04/12 10:00 AM      Component Value Range Comment   Squamous Epithelial / LPF MANY (*) RARE    WBC, UA 3-6  <3 WBC/hpf    RBC / HPF 0-2  <3 RBC/hpf    Bacteria, UA FEW (*) RARE   GLUCOSE, CAPILLARY     Status: Abnormal   Collection Time   03/04/12 11:58 AM      Component Value Range Comment   Glucose-Capillary 164 (*) 70 - 99 mg/dL   GLUCOSE, CAPILLARY     Status: Abnormal   Collection Time   03/04/12  4:48 PM      Component Value Range Comment   Glucose-Capillary 192 (*) 70 - 99 mg/dL   GLUCOSE, CAPILLARY     Status: Abnormal   Collection Time   03/04/12  9:13 PM      Component Value Range Comment   Glucose-Capillary 129 (*) 70 - 99 mg/dL    Comment 1 Notify RN     GLUCOSE, CAPILLARY      Status: Abnormal   Collection Time   03/05/12  6:07 AM      Component Value Range Comment   Glucose-Capillary 137 (*) 70 - 99 mg/dL     Physical Findings: AIMS: Facial and Oral Movements Muscles of Facial Expression: None, normal Lips and Perioral Area: None, normal Jaw: None, normal Tongue: None, normal,Extremity Movements Upper (arms, wrists, hands, fingers): None, normal Lower (legs, knees, ankles, toes): None, normal, Trunk Movements Neck, shoulders, hips: None, normal, Overall Severity Severity of abnormal movements (highest score from questions above): None, normal Incapacitation due to abnormal movements: None, normal Patient's awareness of abnormal movements (rate only patient's report): No Awareness,  Dental Status Current problems with teeth and/or dentures?: No Does patient usually wear dentures?: No  CIWA:  CIWA-Ar Total: 1  COWS:  COWS Total Score: 1   Treatment Plan Summary: Daily contact with patient to assess and evaluate symptoms and progress in treatment Medication management  Plan: We will order her naproxen for her low back pain, and allow time for her other medications to become effective. We will need to investigate possibilities for followup.  [name] 03/05/2012, 10:50 AM

## 2012-03-05 NOTE — Progress Notes (Signed)
Pt attended discharge planning group and actively participated in group.  SW provided pt with today's workbook.  Pt presents with agitated affect and mood.  Pt rates depression and anxiety at a 10 today.  Pt endorses SI today but contracts for safety.  Pt is very irritated about her treatment here so far.  Pt states that she is not a cocaine abuser and feels everyone is labeling her as such and not addressing her mental health.  Pt states that she was on percocet and robaxin at home and didn't abuse it, but now she isn't allowed to have it here.  Pt states that she is in a lot of pain, severely depressed and anxious.  Pt then asks about treatment options.  SW explained that these are substance abuse treatment and she would not meet criteria to go if she doesn't use that often and doesn't feel she has a problem.  Pt states that she wants a place to "relax and recover" for her mental health.  Pt states that she can return home in [facility].  Pt states that she's been to [facility] for medication management in the past.  SW will call to see if pt can still follow up there.  No further needs voiced by pt at this time.    Reyes Ivan, LCSWA 03/05/2012  10:42 AM

## 2012-03-05 NOTE — Progress Notes (Signed)
Patient interacting well with peers today. She reported feeling better, she rated her depressive feelings at 7 on a scale of 1-10 with 10 the worst. Pt stated her main stressor was the pain and that the naproxen has really helped with the pain.Pt stated the pain started with 10 and it's now 7. She denied SI/HI and denied hallucinations. Patient attended group, pleasant and med compliant. Q 15 minute check continues to maintain safety.

## 2012-03-05 NOTE — Progress Notes (Signed)
D:  Patient reports that she slept poorly and requested medication.  He energy level is low and her ability to pay attention is poor.  She is rating depression at 10/10 and hopelessness at 10/10.  She stated this morning that her anxiety is high due to back pain.  Notified MD of pain and anxiety patient was ordered Naproxen and Vistaril.   A:  Administered medications as ordered.  Naproxen given as ordered.  Emotional support provided.  Safety checks q 15 minutes. R:  Safety maintained on unit.  Patient stated that her pain was better after Naproxen.

## 2012-03-05 NOTE — Progress Notes (Signed)
[facility] Group Notes:  (Counselor/Nursing/MHT/Case Management/Adjunct)   Type of Therapy:  Group Therapy 1:15 to 2:30PM  Participation Level:  Did Not Attend  [name] 03/05/2012 2:47 PM

## 2012-03-06 LAB — GLUCOSE, CAPILLARY: Glucose-Capillary: 204 mg/dL — ABNORMAL HIGH (ref 70–99)

## 2012-03-06 MED ORDER — TRAZODONE HCL 100 MG PO TABS
100.0000 mg | ORAL_TABLET | Freq: Every day | ORAL | Status: DC
  Administered 2012-03-06 – 2012-03-11 (×6): 100 mg via ORAL
  Filled 2012-03-06 (×8): qty 1

## 2012-03-06 NOTE — Progress Notes (Signed)
Pt attended discharge planning group and actively participated in group.  SW provided pt with today's workbook.  Pt presents with flat affect and depressed mood.  Pt rates depression at an 8 and anxiety at a 9 today.  Pt denies SI/HI today.  Pt states that she is waiting for the medication to start working.  Pt states that she doesn't know where her depression is coming from.  Pt states that she's been to [facility] before for a medication management appointment.  SW contacted them but state they can not see her due to her substance use.  SW will assess for other referrals today.  No further needs voiced by pt at this time.    [name], LCSWA 03/06/2012  11:40 AM

## 2012-03-06 NOTE — Progress Notes (Signed)
[facility] Family/Significant Other Suicide Prevention Education  Suicide Prevention Education:  Contact Attempts: multiple friends/family have been identified by the patient as the person(s) who will aid the patient in the event of a mental health crisis.  With written consent from the patient, two attempts were made to provide suicide prevention education, prior to and/or following the patient's discharge.  We were unsuccessful in providing suicide prevention education.  A suicide education pamphlet was given to the patient to share with family/significant other.  Date and time of first attempt: 03/06/2012 at 4:00 PM Called both patient's pastor Ardelle Park at [phone] and patient's boyfriend Virgina Evener at [phone]  Date and time of second attempt:03/07/2012 at 3:00PM - 3:15PM Called patient's Luetta Nutting who reports neither she nor Palma Holter would be the ones to provide safety information to and that caller should contact patient's sister or boyfriend. Patient's boyfriend Virgina Evener at [phone], call was answered by his sister who reports boyfriend was using this number which is actually her cell as his phone is down to 6 minutes and he was not available.  Called sister, Gena Fray at [phone] and recording came on stating number was disconnected.   Writer provided suicide prevention education directly to patient; conversation included risk factors, warning signs and resources to contact for help. Mobile crisis services explained and contact card placed in chart for pt to receive at discharge.  Clide Dales 03/07/2012, 4:11 PM  First attempt: 03/06/2012 at 4:00 PM Called both patient's pastor Ardelle Park at [phone] and patient's boyfriend Virgina Evener at [phone]

## 2012-03-06 NOTE — Progress Notes (Signed)
[facility] Group Notes:  (Counselor/Nursing/MHT/Case Management/Adjunct)   Type of Therapy:  Group Therapy 1:15 to 2:30  Participation Level:  Did Not Attend  Clide Dales 03/06/2012 2:33 PM

## 2012-03-06 NOTE — Progress Notes (Signed)
[facility] MD Progress Note  03/06/2012 9:50 AM S: Patient seen this morning. Reports poor sleep, hearing voices. Had suicidal thoughts last night, feels her life is not worth living.   Diagnosis:   Axis I: Schizoaffective Disorder Axis II: No diagnosis Axis III:  Past Medical History  Diagnosis Date  . Asthma     past 10 years  . Arthritis     djd -lower back  . Diabetes mellitus     past 5 years  . Glaucoma(365)     dx 06/2011-[facility]  . Hypertension   . Hyperlipidemia   . Hyperthyroidism   . History of TIA (transient ischemic attack)     01/06/2011  . Pancreatitis   . Degenerative disc disease   . Gout   . Depression    Axis IV: other psychosocial or environmental problems Axis V: 51-60 moderate symptoms  ADL's:  Intact  Sleep: Fair  Appetite:  Fair  Mental Status Examination/Evaluation: Objective:  Appearance: Casual  Eye Contact::  Fair  Speech:  Slow  Volume:  Decreased  Mood:  Dysphoric  Affect:  Constricted  Thought Process:  Coherent  Orientation:  Full  Thought Content:  Hallucinations: Auditory  Suicidal Thoughts:  Yes.  without intent/plan  Homicidal Thoughts:  No  Memory:  Immediate;   Fair Recent;   Fair Remote;   Fair  Judgement:  Fair  Insight:  Fair  Psychomotor Activity:  Normal  Concentration:  Fair  Recall:  Fair  Akathisia:  No  Handed:  Right  AIMS (if indicated):     Assets:  Communication Skills Desire for Improvement  Sleep:  Number of Hours: 6    Vital Signs:Blood pressure 95/61, pulse 64, temperature 96.9 F (36.1 C), temperature source Oral, resp. rate 16, height 4' 10.75" (1.492 m), weight 85.276 kg (188 lb). Current Medications: Current Facility-Administered Medications  Medication Dose Route Frequency Provider Last Rate Last Dose  . albuterol (PROVENTIL HFA;VENTOLIN HFA) 108 (90 BASE) MCG/ACT inhaler 2 puff  2 puff Inhalation Q6H PRN Kerry Hough, PA      . allopurinol (ZYLOPRIM) tablet 100 mg  100 mg Oral  Daily Kerry Hough, PA   100 mg at 03/06/12 0820  . alum & mag hydroxide-simeth (MAALOX/MYLANTA) 200-200-20 MG/5ML suspension 30 mL  30 mL Oral Q4H PRN Kerry Hough, PA      . amLODipine (NORVASC) tablet 5 mg  5 mg Oral Daily Edithe Dobbin, MD   5 mg at 03/06/12 0820   And  . benazepril (LOTENSIN) tablet 20 mg  20 mg Oral Daily Irina Okelly, MD   20 mg at 03/06/12 1610  . aspirin EC tablet 81 mg  81 mg Oral Daily Kerry Hough, PA   81 mg at 03/06/12 9604  . atorvastatin (LIPITOR) tablet 20 mg  20 mg Oral Daily Kerry Hough, PA   20 mg at 03/06/12 5409  . brimonidine (ALPHAGAN) 0.2 % ophthalmic solution 1 drop  1 drop Both Eyes BID Kerry Hough, PA   1 drop at 03/06/12 8119  . busPIRone (BUSPAR) tablet 15 mg  15 mg Oral TID Kerry Hough, PA   15 mg at 03/06/12 0824  . colchicine tablet 0.6 mg  0.6 mg Oral QID PRN Kerry Hough, PA      . divalproex (DEPAKOTE ER) 24 hr tablet 500 mg  500 mg Oral QHS Kerry Hough, PA   500 mg at 03/05/12 2151  . DULoxetine (CYMBALTA)  DR capsule 60 mg  60 mg Oral QHS Kerry Hough, PA   60 mg at 03/05/12 2203  . gemfibrozil (LOPID) tablet 600 mg  600 mg Oral BID AC Kerry Hough, PA   600 mg at 03/06/12 0651  . glipiZIDE (GLUCOTROL) tablet 5 mg  5 mg Oral QAC breakfast Kerry Hough, PA   5 mg at 03/06/12 0651  . hydrochlorothiazide (HYDRODIURIL) tablet 25 mg  25 mg Oral Daily Kerry Hough, PA   25 mg at 03/06/12 0825  . hydrOXYzine (ATARAX/VISTARIL) tablet 25 mg  25 mg Oral BID PRN Inanna Telford, MD   25 mg at 03/06/12 0234  . insulin aspart (novoLOG) injection 0-15 Units  0-15 Units Subcutaneous TID WC Kerry Hough, PA   2 Units at 03/06/12 [phone]  . insulin aspart (novoLOG) injection 0-5 Units  0-5 Units Subcutaneous QHS Kerry Hough, PA      . latanoprost (XALATAN) 0.005 % ophthalmic solution 1 drop  1 drop Both Eyes QHS Kerry Hough, PA   1 drop at 03/05/12 2145  . levothyroxine (SYNTHROID, LEVOTHROID) tablet 200 mcg   200 mcg Oral Daily Kerry Hough, PA   200 mcg at 03/06/12 1191  . linagliptin (TRADJENTA) tablet 5 mg  5 mg Oral Daily Kerry Hough, PA   5 mg at 03/06/12 0826  . magnesium hydroxide (MILK OF MAGNESIA) suspension 30 mL  30 mL Oral Daily PRN Kerry Hough, PA      . naproxen (NAPROSYN) tablet 500 mg  500 mg Oral BID WC Jorje Guild, PA-C   500 mg at 03/06/12 0827  . nicotine (NICODERM CQ - dosed in mg/24 hours) patch 21 mg  21 mg Transdermal Q0600 Kerry Hough, PA   21 mg at 03/06/12 0650  . pantoprazole (PROTONIX) EC tablet 40 mg  40 mg Oral Daily Kerry Hough, PA   40 mg at 03/06/12 0827  . pioglitazone (ACTOS) tablet 30 mg  30 mg Oral Daily Kerry Hough, PA   30 mg at 03/06/12 0827  . traZODone (DESYREL) tablet 50 mg  50 mg Oral QHS,MR X 1 Kerry Hough, PA   50 mg at 03/05/12 2147  . DISCONTD: naproxen (NAPROSYN) tablet 500 mg  500 mg Oral BID PRN Patrick North, MD        Lab Results:  Results for orders placed during the hospital encounter of 03/03/12 (from the past 48 hour(s))  URINALYSIS, ROUTINE W REFLEX MICROSCOPIC     Status: Abnormal   Collection Time   03/04/12 10:00 AM      Component Value Range Comment   Color, Urine YELLOW  YELLOW    APPearance CLOUDY (*) CLEAR    Specific Gravity, Urine 1.020  1.005 - 1.030    pH 5.5  5.0 - 8.0    Glucose, UA NEGATIVE  NEGATIVE mg/dL    Hgb urine dipstick NEGATIVE  NEGATIVE    Bilirubin Urine NEGATIVE  NEGATIVE    Ketones, ur NEGATIVE  NEGATIVE mg/dL    Protein, ur NEGATIVE  NEGATIVE mg/dL    Urobilinogen, UA 0.2  0.0 - 1.0 mg/dL    Nitrite NEGATIVE  NEGATIVE    Leukocytes, UA MODERATE (*) NEGATIVE   URINE MICROSCOPIC-ADD ON     Status: Abnormal   Collection Time   03/04/12 10:00 AM      Component Value Range Comment   Squamous Epithelial / LPF MANY (*) RARE  WBC, UA 3-6  <3 WBC/hpf    RBC / HPF 0-2  <3 RBC/hpf    Bacteria, UA FEW (*) RARE   GLUCOSE, CAPILLARY     Status: Abnormal   Collection Time   03/04/12  11:58 AM      Component Value Range Comment   Glucose-Capillary 164 (*) 70 - 99 mg/dL   GLUCOSE, CAPILLARY     Status: Abnormal   Collection Time   03/04/12  4:48 PM      Component Value Range Comment   Glucose-Capillary 192 (*) 70 - 99 mg/dL   GLUCOSE, CAPILLARY     Status: Abnormal   Collection Time   03/04/12  9:13 PM      Component Value Range Comment   Glucose-Capillary 129 (*) 70 - 99 mg/dL    Comment 1 Notify RN     GLUCOSE, CAPILLARY     Status: Abnormal   Collection Time   03/05/12  6:07 AM      Component Value Range Comment   Glucose-Capillary 137 (*) 70 - 99 mg/dL   GLUCOSE, CAPILLARY     Status: Abnormal   Collection Time   03/05/12 11:59 AM      Component Value Range Comment   Glucose-Capillary 153 (*) 70 - 99 mg/dL   GLUCOSE, CAPILLARY     Status: Abnormal   Collection Time   03/05/12  5:04 PM      Component Value Range Comment   Glucose-Capillary 168 (*) 70 - 99 mg/dL   GLUCOSE, CAPILLARY     Status: Abnormal   Collection Time   03/05/12  9:30 PM      Component Value Range Comment   Glucose-Capillary 185 (*) 70 - 99 mg/dL   GLUCOSE, CAPILLARY     Status: Abnormal   Collection Time   03/06/12  5:57 AM      Component Value Range Comment   Glucose-Capillary 137 (*) 70 - 99 mg/dL    Comment 1 Notify RN       Physical Findings: AIMS: Facial and Oral Movements Muscles of Facial Expression: None, normal Lips and Perioral Area: None, normal Jaw: None, normal Tongue: None, normal,Extremity Movements Upper (arms, wrists, hands, fingers): None, normal Lower (legs, knees, ankles, toes): None, normal, Trunk Movements Neck, shoulders, hips: None, normal, Overall Severity Severity of abnormal movements (highest score from questions above): None, normal Incapacitation due to abnormal movements: None, normal Patient's awareness of abnormal movements (rate only patient's report): No Awareness, Dental Status Current problems with teeth and/or dentures?: No Does patient  usually wear dentures?: No  CIWA:  CIWA-Ar Total: 1  COWS:  COWS Total Score: 1   Treatment Plan Summary: Daily contact with patient to assess and evaluate symptoms and progress in treatment Medication management  Plan: Increase Trazodone to 100mg  po qhs. Continue to monitor. Hold Norvasc for BP less than 100/60 mm hg.   Vendela Troung 03/06/2012, 9:50 AM

## 2012-03-06 NOTE — Progress Notes (Signed)
11AM GROUP  Pt. Did Not Attend Group 

## 2012-03-06 NOTE — Progress Notes (Signed)
Patient ID: [name], female   DOB: [birth year], 56 y.o.   MRN: [id number] D: Pt. Reports depression and anxiety at "6" of 10. Pt. Reports "things going better, they trying to get my medicine right." "I got hope." Pt. Reports groups been helpful "thinking more positive now." A: Pt. Encouraged to attend karaoke. Writer provided emotional support, encouraged to move forward in recovery and dissolve unhealthy relationships. R: Pt. Remains positive about recovery, says about unhealthy relationships "I don't need them."  Pt. Is safe on the unit and attends karaoke.

## 2012-03-06 NOTE — Tx Team (Signed)
Interdisciplinary Treatment Plan Update (Adult)  Date:  03/06/2012  Time Reviewed:  10:34 AM   Progress in Treatment: Attending groups: Yes Participating in groups:  Yes Taking medication as prescribed: Yes Tolerating medication:  Yes Family/Significant othe contact made:  No Patient understands diagnosis:  Yes Discussing patient identified problems/goals with staff:  Yes Medical problems stabilized or resolved:  Yes Denies suicidal/homicidal ideation: Yes Issues/concerns per patient self-inventory:  None identified Other: N/A  New problem(s) identified: None Identified  Reason for Continuation of Hospitalization: Anxiety Depression Suicidal Ideation  Interventions implemented related to continuation of hospitalization: mood stabilization, medication monitoring and adjustment, group therapy and psycho education, safety checks q 15 mins  Additional comments: N/A  Estimated length of stay: 3-4 days  Discharge Plan: SW is assessing for appropriate referrals.    New goal(s): N/A  Review of initial/current patient goals per problem list:    1.  Goal(s): Address substance use  Met:  No  Target date: by discharge  As evidenced by: completing detox protocol and refer to appropriate treatment  2.  Goal (s): Reduce depressive symptoms  Met:  No  Target date: by discharge  As evidenced by: Reducing depression from a 10 to a 3 as reported by pt.    3.  Goal(s): Eliminate SI  Met:  No  Target date: by discharge  As evidenced by: pt denying SI   Attendees: Patient:     Family:     Physician: Patrick North, MD 03/06/2012 10:34 AM   Nursing:  03/06/2012 10:34 AM   Case Manager:  Reyes Ivan, LCSWA 03/06/2012  10:34 AM   Counselor:  Ronda Fairly, LCSWA 03/06/2012  10:34 AM   Other:  Jorje Guild, PA 03/06/2012 10:35 AM   Other:     Other:     Other:      Scribe for Treatment Team:   Reyes Ivan 03/06/2012 10:34 AM

## 2012-03-06 NOTE — Progress Notes (Addendum)
D:  Patient's self inventory sheet, needs sleep medication, has improving appetiti, low energy level, improving attention span.  Rated depression and hopelessness #6.  Denied withdrawals.  SI off and on, contracts for safety.  Pain goal #10, worst pain #8.  After discharge, plans to get "more exercise and get out more.  Why am I still feeling so down."  Does have discharge plans.  No problems taking medications after discharge. A:  Medications given per MD order.   Support and encouragement given throughout day.  Support and safety checks completed as ordered. R:  Following treatment plan.   SI off/on, contracts for safety.  Denied A/V hallucinations.  Patient remains safe and receptive on unit.  Patient denied SI while talking to nurse this morning, contracts for safety.   Patient has been cooperative and pleasant. Has been going to dining room for meals.

## 2012-03-07 LAB — GLUCOSE, CAPILLARY: Glucose-Capillary: 197 mg/dL — ABNORMAL HIGH (ref 70–99)

## 2012-03-07 MED ORDER — SERTRALINE HCL 25 MG PO TABS
25.0000 mg | ORAL_TABLET | Freq: Every day | ORAL | Status: DC
  Administered 2012-03-07 – 2012-03-08 (×2): 25 mg via ORAL
  Filled 2012-03-07 (×4): qty 1

## 2012-03-07 NOTE — Progress Notes (Signed)
D:  Pt pleasant and calm in dayroom.  Denies SI/HI/AVH, contracts for safety, pt has no complaints at this time.  A:  Encouraged pt to attend and participate in groups.  Encouraged pt to let staff know if she needs anything.  R:  Pt receptive.  Active in milieu, calm and positive attitude.

## 2012-03-07 NOTE — Progress Notes (Signed)
D.  Took over Pt care at 2330.  Pt just received a Vistaril prn for anxiety and insomnia just before my shift began.  Resting in bed awake now, no acute distress noted.  A.  Will continue to monitor R.  Pt remains safe

## 2012-03-07 NOTE — Progress Notes (Signed)
Psychoeducational Group Note  Date:  03/07/2012 Time:  1100  Group Topic/Focus:  Relapse Prevention Planning:   The focus of this group is to define relapse and discuss the need for planning to combat relapse.  Participation Level:  Did Not Attend  Dalia Heading 03/07/2012, 6:03 PM

## 2012-03-07 NOTE — Progress Notes (Signed)
Patient did attend the evening karaoke group. Patient was attentive, engaged, and participated by singing a song.  

## 2012-03-07 NOTE — Progress Notes (Signed)
Pt attended discharge planning group and actively participated in group.  SW provided pt with today's workbook.  Pt presents with flat affect and depressed mood.  Pt rates depression and anxiety at a 7 today.  Pt denies SI/HI.  Pt states that she doesn't think her medication is working and wants to talk to the doctor further about this.  Pt was open to referrals for outpatient services.  Pt was unable to go back to [facility] and there were no other providers in [facility] that could serve pt.  SW made a referral to [facility] and they too would not accept pt.  Pt states that she's been to [facility] before but didn't want to go back to Dr. Lolly Mustache.  SW secured pt's follow up with [facility] for medication management and therapy.  Pt requested SW to send a letter to [facility] for a court date on Monday.  No further needs voiced by pt at this time.  Safety planning and suicide prevention discussed.  Pt participated in discussion and acknowledged an understanding of the information provided.       Reyes Ivan, LCSWA 03/07/2012  11:06 AM

## 2012-03-07 NOTE — Progress Notes (Signed)
[facility] Group Notes:  (Counselor/Nursing/MHT/Case Management/Adjunct)  03/07/2012 4:12 PM  Type of Therapy:  Group Therapy  Participation Level:  Did Not Attend  Clide Dales  03/07/2012, 4:12 PM

## 2012-03-07 NOTE — Progress Notes (Signed)
Psychoeducational Group Note  Date:  03/07/2012 Time:  2005  Group Topic/Focus: AA Group  Participation Level:  Active  Participation Quality:  Appropriate  Affect:  Appropriate  Cognitive:  Appropriate  Insight:  Good  Engagement in Group:  Good  Additional Comments:    Zedrick Springsteen K 03/07/2012, 10:44 PM

## 2012-03-07 NOTE — Progress Notes (Signed)
[facility] MD Progress Note  03/07/2012 11:02 AM S: Patient seen today. Reports doing better, mood improved. Was talking to her sister all night, states it was helpful. Anxiety has improved.  Diagnosis:   Axis I: Depressive d/o nos Axis II: No diagnosis Axis III:  Past Medical History  Diagnosis Date  . Asthma     past 10 years  . Arthritis     djd -lower back  . Diabetes mellitus     past 5 years  . Glaucoma(365)     dx 06/2011-[facility]  . Hypertension   . Hyperlipidemia   . Hyperthyroidism   . History of TIA (transient ischemic attack)     01/06/2011  . Pancreatitis   . Degenerative disc disease   . Gout   . Depression    Axis IV: other psychosocial or environmental problems Axis V: 51-60 moderate symptoms  ADL's:  Intact  Sleep: Fair  Appetite:  Fair  Mental Status Examination/Evaluation: Objective:  Appearance: Casual  Eye Contact::  Good  Speech:  Clear and Coherent  Volume:  Normal  Mood:  Dysphoric  Affect:  Congruent  Thought Process:  Coherent  Orientation:  Full  Thought Content:  WDL  Suicidal Thoughts:  No  Homicidal Thoughts:  No  Memory:  Immediate;   Fair Recent;   Fair Remote;   Fair  Judgement:  Fair  Insight:  Fair  Psychomotor Activity:  Normal  Concentration:  Fair  Recall:  Fair  Akathisia:  No  Handed:  Right  AIMS (if indicated):     Assets:  Desire for Improvement  Sleep:  Number of Hours: 4    Vital Signs:Blood pressure 126/83, pulse 89, temperature 96.9 F (36.1 C), temperature source Oral, resp. rate 16, height 4' 10.75" (1.492 m), weight 85.276 kg (188 lb). Current Medications: Current Facility-Administered Medications  Medication Dose Route Frequency Provider Last Rate Last Dose  . albuterol (PROVENTIL HFA;VENTOLIN HFA) 108 (90 BASE) MCG/ACT inhaler 2 puff  2 puff Inhalation Q6H PRN Kerry Hough, PA      . allopurinol (ZYLOPRIM) tablet 100 mg  100 mg Oral Daily Kerry Hough, PA   100 mg at 03/07/12 0817  .  alum & mag hydroxide-simeth (MAALOX/MYLANTA) 200-200-20 MG/5ML suspension 30 mL  30 mL Oral Q4H PRN Kerry Hough, PA      . aspirin EC tablet 81 mg  81 mg Oral Daily Kerry Hough, PA   81 mg at 03/07/12 0817  . atorvastatin (LIPITOR) tablet 20 mg  20 mg Oral Daily Kerry Hough, PA   20 mg at 03/07/12 0818  . benazepril (LOTENSIN) tablet 20 mg  20 mg Oral Daily Erynn Vaca, MD   20 mg at 03/07/12 0817  . brimonidine (ALPHAGAN) 0.2 % ophthalmic solution 1 drop  1 drop Both Eyes BID Kerry Hough, PA   1 drop at 03/07/12 0818  . busPIRone (BUSPAR) tablet 15 mg  15 mg Oral TID Kerry Hough, PA   5 mg at 03/07/12 0817  . colchicine tablet 0.6 mg  0.6 mg Oral QID PRN Kerry Hough, PA      . divalproex (DEPAKOTE ER) 24 hr tablet 500 mg  500 mg Oral QHS Kerry Hough, PA   500 mg at 03/06/12 2142  . DULoxetine (CYMBALTA) DR capsule 60 mg  60 mg Oral QHS Kerry Hough, PA   60 mg at 03/06/12 2142  . gemfibrozil (LOPID) tablet 600 mg  600 mg Oral BID AC Kerry Hough, PA   600 mg at 03/07/12 4098  . glipiZIDE (GLUCOTROL) tablet 5 mg  5 mg Oral QAC breakfast Kerry Hough, PA   5 mg at 03/07/12 1191  . hydrochlorothiazide (HYDRODIURIL) tablet 25 mg  25 mg Oral Daily Kerry Hough, PA   25 mg at 03/07/12 0816  . hydrOXYzine (ATARAX/VISTARIL) tablet 25 mg  25 mg Oral BID PRN Rishika Mccollom, MD   25 mg at 03/06/12 0234  . insulin aspart (novoLOG) injection 0-15 Units  0-15 Units Subcutaneous TID WC Kerry Hough, PA   3 Units at 03/07/12 [phone]  . insulin aspart (novoLOG) injection 0-5 Units  0-5 Units Subcutaneous QHS Kerry Hough, PA      . latanoprost (XALATAN) 0.005 % ophthalmic solution 1 drop  1 drop Both Eyes QHS Kerry Hough, PA   1 drop at 03/06/12 2202  . levothyroxine (SYNTHROID, LEVOTHROID) tablet 200 mcg  200 mcg Oral Daily Kerry Hough, PA   200 mcg at 03/07/12 0816  . linagliptin (TRADJENTA) tablet 5 mg  5 mg Oral Daily Kerry Hough, PA   5 mg at 03/07/12  0817  . magnesium hydroxide (MILK OF MAGNESIA) suspension 30 mL  30 mL Oral Daily PRN Kerry Hough, PA      . naproxen (NAPROSYN) tablet 500 mg  500 mg Oral BID WC Jorje Guild, PA-C   500 mg at 03/07/12 0816  . nicotine (NICODERM CQ - dosed in mg/24 hours) patch 21 mg  21 mg Transdermal Q0600 Kerry Hough, PA   21 mg at 03/07/12 0648  . pantoprazole (PROTONIX) EC tablet 40 mg  40 mg Oral Daily Kerry Hough, PA   40 mg at 03/07/12 0817  . pioglitazone (ACTOS) tablet 30 mg  30 mg Oral Daily Kerry Hough, PA   30 mg at 03/07/12 0818  . traZODone (DESYREL) tablet 100 mg  100 mg Oral QHS Bryor Rami, MD   100 mg at 03/06/12 2142    Lab Results:  Results for orders placed during the hospital encounter of 03/03/12 (from the past 48 hour(s))  GLUCOSE, CAPILLARY     Status: Abnormal   Collection Time   03/05/12 11:59 AM      Component Value Range Comment   Glucose-Capillary 153 (*) 70 - 99 mg/dL   GLUCOSE, CAPILLARY     Status: Abnormal   Collection Time   03/05/12  5:04 PM      Component Value Range Comment   Glucose-Capillary 168 (*) 70 - 99 mg/dL   GLUCOSE, CAPILLARY     Status: Abnormal   Collection Time   03/05/12  9:30 PM      Component Value Range Comment   Glucose-Capillary 185 (*) 70 - 99 mg/dL   GLUCOSE, CAPILLARY     Status: Abnormal   Collection Time   03/06/12  5:57 AM      Component Value Range Comment   Glucose-Capillary 137 (*) 70 - 99 mg/dL    Comment 1 Notify RN     GLUCOSE, CAPILLARY     Status: Abnormal   Collection Time   03/06/12 12:12 PM      Component Value Range Comment   Glucose-Capillary 189 (*) 70 - 99 mg/dL    Comment 1 Notify RN     GLUCOSE, CAPILLARY     Status: Abnormal   Collection Time   03/06/12  5:04 PM  Component Value Range Comment   Glucose-Capillary 204 (*) 70 - 99 mg/dL    Comment 1 Notify RN     GLUCOSE, CAPILLARY     Status: Abnormal   Collection Time   03/06/12 10:08 PM      Component Value Range Comment    Glucose-Capillary 196 (*) 70 - 99 mg/dL    Comment 1 Notify RN      Comment 2 Documented in Chart     GLUCOSE, CAPILLARY     Status: Abnormal   Collection Time   03/07/12  6:35 AM      Component Value Range Comment   Glucose-Capillary 197 (*) 70 - 99 mg/dL     Physical Findings: AIMS: Facial and Oral Movements Muscles of Facial Expression: None, normal Lips and Perioral Area: None, normal Jaw: None, normal Tongue: None, normal,Extremity Movements Upper (arms, wrists, hands, fingers): None, normal Lower (legs, knees, ankles, toes): None, normal, Trunk Movements Neck, shoulders, hips: None, normal, Overall Severity Severity of abnormal movements (highest score from questions above): None, normal Incapacitation due to abnormal movements: None, normal Patient's awareness of abnormal movements (rate only patient's report): No Awareness, Dental Status Current problems with teeth and/or dentures?: No Does patient usually wear dentures?: No  CIWA:  CIWA-Ar Total: 0  COWS:  COWS Total Score: 0   Treatment Plan Summary: Daily contact with patient to assess and evaluate symptoms and progress in treatment Medication management  Plan: Continue current plan of care. Start Zoloft at 25mg  po qd. Plan for discharge Monday. Shevy Yaney 03/07/2012, 11:02 AM

## 2012-03-07 NOTE — Progress Notes (Signed)
D slept poorly last nite d/t WD s/s agitation and lightheadedness, appetite is poor, energy level is low and ability to pay attention is poor, depressed 6/10 and hopeless 7/10, denies Si or HI, she has a plan to start therapy next week, attending group A q57min safety checks continue and support offered, encouraged to attend group and participate R patient remains safe on the unit

## 2012-03-07 NOTE — Tx Team (Signed)
Interdisciplinary Treatment Plan Update (Adult)  Date:  03/07/2012  Time Reviewed:  9:49 AM   Progress in Treatment: Attending groups: Yes Participating in groups:  Yes Taking medication as prescribed: Yes Tolerating medication:  Yes Family/Significant othe contact made:  Counselor assessing for appropriate contact Patient understands diagnosis:  Yes Discussing patient identified problems/goals with staff:  Yes Medical problems stabilized or resolved:  Yes Denies suicidal/homicidal ideation: Yes Issues/concerns per patient self-inventory:  None identified Other: N/A  New problem(s) identified: None Identified  Reason for Continuation of Hospitalization: Anxiety Depression Medication stabilization Withdrawal symptoms  Interventions implemented related to continuation of hospitalization: mood stabilization, medication monitoring and adjustment, group therapy and psycho education, safety checks q 15 mins  Additional comments: N/A  Estimated length of stay: 3-5 days  Discharge Plan: SW is assessing for appropriate referrals.    New goal(s): N/A  Review of initial/current patient goals per problem list:    1.  Goal(s): Address substance use  Met:  No  Target date: by discharge  As evidenced by: completing detox protocol and refer to appropriate treatment  2.  Goal (s): Reduce depressive and anxiety symptoms  Met:  No  Target date: by discharge  As evidenced by: Reducing depression from a 10 to a 3 as reported by pt.  Pt rates at a 7 today.    3.  Goal(s): Eliminate SI  Met:  No  Target date: by discharge  As evidenced by: pt denying SI   Attendees: Patient:     Family:     Physician: Patrick North, MD 03/07/2012 9:49 AM   Nursing: Alease Frame, RN 03/07/2012 9:49 AM   Case Manager:  Reyes Ivan, LCSWA 03/07/2012  9:49 AM   Counselor:  Ronda Fairly, LCSWA 03/07/2012  9:49 AM   Other:  Lamount Cranker, RN 03/07/2012 9:50 AM   Other:  Jorje Guild, PA  03/07/2012 9:50 AM   Other:     Other:      Scribe for Treatment Team:   Reyes Ivan 03/07/2012 9:49 AM

## 2012-03-08 DIAGNOSIS — F259 Schizoaffective disorder, unspecified: Secondary | ICD-10-CM

## 2012-03-08 LAB — GLUCOSE, CAPILLARY: Glucose-Capillary: 181 mg/dL — ABNORMAL HIGH (ref 70–99)

## 2012-03-08 MED ORDER — HALOPERIDOL 0.5 MG PO TABS
0.5000 mg | ORAL_TABLET | Freq: Two times a day (BID) | ORAL | Status: DC
  Administered 2012-03-08 – 2012-03-09 (×2): 0.5 mg via ORAL
  Filled 2012-03-08 (×7): qty 1

## 2012-03-08 MED ORDER — LIDOCAINE 5 % EX PTCH
2.0000 | MEDICATED_PATCH | CUTANEOUS | Status: DC
  Administered 2012-03-08: 2 via TRANSDERMAL
  Filled 2012-03-08 (×4): qty 2

## 2012-03-08 MED ORDER — SERTRALINE HCL 50 MG PO TABS
75.0000 mg | ORAL_TABLET | Freq: Every day | ORAL | Status: DC
  Administered 2012-03-09 – 2012-03-12 (×4): 75 mg via ORAL
  Filled 2012-03-08 (×5): qty 1

## 2012-03-08 MED ORDER — DIVALPROEX SODIUM ER 500 MG PO TB24
1000.0000 mg | ORAL_TABLET | Freq: Every day | ORAL | Status: DC
  Administered 2012-03-08 – 2012-03-11 (×4): 1000 mg via ORAL
  Filled 2012-03-08: qty 2
  Filled 2012-03-08: qty 1
  Filled 2012-03-08 (×4): qty 2

## 2012-03-08 NOTE — Progress Notes (Signed)
Psychoeducational Group Note  Date:  03/08/2012 Time: 2100  Group Topic/Focus:  wrap up group  Participation Level: Did Not Attend  Participation Quality:  Not Applicable  Affect:  Not Applicable  Cognitive:  Not Applicable  Insight:  Not Applicable  Engagement in Group: Not Applicable  Additional Comments:Pt remained in bed sleeping.  Shelah Lewandowsky 03/08/2012, 10:24 PM

## 2012-03-08 NOTE — Progress Notes (Signed)
Patient ID: MERIDIAN SCHERGER, female   DOB: 1956-04-27, 56 y.o.   MRN: [id number]   [facility] Group Notes:  (Counselor/Nursing/MHT/Case Management/Adjunct)  03/08/2012 1:15 PM  Type of Therapy:  Group Therapy, Dance/Movement Therapy   Participation Level:  Did Not Attend   [name] 03/08/2012. 2:54 PM

## 2012-03-08 NOTE — Progress Notes (Signed)
Patient ID: [name], female   DOB: 01-14-1956, 56 y.o.   MRN: [id number]  Pt did not attend aftercare planning group.

## 2012-03-08 NOTE — Progress Notes (Signed)
D. Pt anxious, states that she needs to be given something stronger for anxiety.  Inquired about her Haldol but informed that it is not scheduled to be given until 0800 in the AM.  Pt unable to attend group this evening due to feeling anxious.  Denies SI/HI/hallucinations at this time.  Pt returned to her room after night medications and went to bed.  A.  Medication for anxiety offered and given as ordered, support and encouragement given.  R.  No acute distress, pleased that she did not have to receive an insulin shot tonight, will continue to monitor.

## 2012-03-08 NOTE — Progress Notes (Signed)
[facility] MD Progress Note  03/08/2012 2:27 PM 5 Diagnosis:  Axis I: Schizoaffective Disorder  Axis II: No diagnosis  Axis III:  Past Medical History   Diagnosis  Date   .  Asthma      past 10 years   .  Arthritis      djd -lower back   .  Diabetes mellitus      past 5 years   .  Glaucoma(365)      dx 06/2011-[facility]   .  Hypertension    .  Hyperlipidemia    .  Hyperthyroidism    .  History of TIA (transient ischemic attack)      01/06/2011   .  Pancreatitis    .  Degenerative disc disease    .  Gout    .  Depression     Axis IV: other psychosocial or environmental problems  Axis V: 51-60 moderate symptoms     ADL's:  Impaired  Sleep: Fair  Appetite:  Fair  Suicidal Ideation:  Patient denies suicidal ideation intent or plans access or means. Homicidal Ideation:  Patient denies homicidal ideation intent or plans access or means.  AEB (as evidenced by): Subjective: Patient reports that her back pain is significant and rates it as a 4, she also notes that her throat pain is improving she is able to eat and drink which has been difficult due to her tonsillitis. She reports withdrawal symptoms of tremors headache and blurred vision and also reports some minor auditory hallucinations. Mental Status Examination/Evaluation: Objective:  Appearance: Disheveled  Eye Contact::  Good  Speech:  Clear and Coherent  Volume:  Normal  Mood:  Depressed  Affect:  Congruent  Thought Process:  Coherent  Orientation:  Full  Thought Content:  Hallucinations: Auditory  Suicidal Thoughts:  No  Homicidal Thoughts:  No  Memory:  Immediate;   Fair Recent;   Fair Remote;   Fair  Judgement:  Fair  Insight:  Present  Psychomotor Activity:  Normal  Concentration:  Fair  Recall:  Fair  Akathisia:  No  Handed:  Right  AIMS (if indicated):     Assets:  Communication Skills Desire for Improvement  Sleep:  Number of Hours: 5    Vital Signs:Blood pressure 129/85, pulse 98,  temperature 96.6 F (35.9 C), temperature source Oral, resp. rate 18, height 4' 10.75" (1.492 m), weight 85.276 kg (188 lb).  Objective: [name] is pleasant courteous and cooperative today she is having a little bit of blurred vision very likely due to the steroids she is on for her tonsillitis states she will be going to [facility] upon completion of her discharge here.  Current Medications: Current Facility-Administered Medications  Medication Dose Route Frequency Provider Last Rate Last Dose  . albuterol (PROVENTIL HFA;VENTOLIN HFA) 108 (90 BASE) MCG/ACT inhaler 2 puff  2 puff Inhalation Q6H PRN Kerry Hough, PA      . allopurinol (ZYLOPRIM) tablet 100 mg  100 mg Oral Daily Kerry Hough, PA   100 mg at 03/08/12 0846  . alum & mag hydroxide-simeth (MAALOX/MYLANTA) 200-200-20 MG/5ML suspension 30 mL  30 mL Oral Q4H PRN Kerry Hough, PA      . aspirin EC tablet 81 mg  81 mg Oral Daily Kerry Hough, PA   81 mg at 03/08/12 0846  . atorvastatin (LIPITOR) tablet 20 mg  20 mg Oral Daily Kerry Hough, PA   20 mg at 03/08/12 0846  .  benazepril (LOTENSIN) tablet 20 mg  20 mg Oral Daily Himabindu Ravi, MD   20 mg at 03/08/12 0846  . brimonidine (ALPHAGAN) 0.2 % ophthalmic solution 1 drop  1 drop Both Eyes BID Kerry Hough, PA   1 drop at 03/08/12 0844  . busPIRone (BUSPAR) tablet 15 mg  15 mg Oral TID Kerry Hough, PA   15 mg at 03/08/12 1212  . colchicine tablet 0.6 mg  0.6 mg Oral QID PRN Kerry Hough, PA      . divalproex (DEPAKOTE ER) 24 hr tablet 500 mg  500 mg Oral QHS Kerry Hough, PA   500 mg at 03/07/12 2223  . gemfibrozil (LOPID) tablet 600 mg  600 mg Oral BID AC Kerry Hough, PA   600 mg at 03/08/12 1610  . glipiZIDE (GLUCOTROL) tablet 5 mg  5 mg Oral QAC breakfast Kerry Hough, PA   5 mg at 03/08/12 9604  . hydrochlorothiazide (HYDRODIURIL) tablet 25 mg  25 mg Oral Daily Kerry Hough, PA   25 mg at 03/08/12 0847  . hydrOXYzine (ATARAX/VISTARIL) tablet 25 mg   25 mg Oral BID PRN Himabindu Ravi, MD   25 mg at 03/07/12 2310  . insulin aspart (novoLOG) injection 0-15 Units  0-15 Units Subcutaneous TID WC Kerry Hough, PA   5 Units at 03/08/12 1213  . insulin aspart (novoLOG) injection 0-5 Units  0-5 Units Subcutaneous QHS Kerry Hough, PA      . latanoprost (XALATAN) 0.005 % ophthalmic solution 1 drop  1 drop Both Eyes QHS Kerry Hough, PA   1 drop at 03/07/12 2223  . levothyroxine (SYNTHROID, LEVOTHROID) tablet 200 mcg  200 mcg Oral Daily Kerry Hough, PA   200 mcg at 03/08/12 0846  . linagliptin (TRADJENTA) tablet 5 mg  5 mg Oral Daily Kerry Hough, PA   5 mg at 03/08/12 0845  . magnesium hydroxide (MILK OF MAGNESIA) suspension 30 mL  30 mL Oral Daily PRN Kerry Hough, PA      . naproxen (NAPROSYN) tablet 500 mg  500 mg Oral BID WC Jorje Guild, PA-C   500 mg at 03/08/12 0846  . nicotine (NICODERM CQ - dosed in mg/24 hours) patch 21 mg  21 mg Transdermal Q0600 Kerry Hough, PA   21 mg at 03/08/12 5409  . pantoprazole (PROTONIX) EC tablet 40 mg  40 mg Oral Daily Kerry Hough, PA   40 mg at 03/08/12 0847  . pioglitazone (ACTOS) tablet 30 mg  30 mg Oral Daily Kerry Hough, PA   30 mg at 03/08/12 0845  . sertraline (ZOLOFT) tablet 25 mg  25 mg Oral Daily Himabindu Ravi, MD   25 mg at 03/08/12 0846  . traZODone (DESYREL) tablet 100 mg  100 mg Oral QHS Himabindu Ravi, MD   100 mg at 03/07/12 2223    Lab Results:  Results for orders placed during the hospital encounter of 03/03/12 (from the past 48 hour(s))  GLUCOSE, CAPILLARY     Status: Abnormal   Collection Time   03/06/12  5:04 PM      Component Value Range Comment   Glucose-Capillary 204 (*) 70 - 99 mg/dL    Comment 1 Notify RN     GLUCOSE, CAPILLARY     Status: Abnormal   Collection Time   03/06/12 10:08 PM      Component Value Range Comment   Glucose-Capillary 196 (*) 70 -  99 mg/dL    Comment 1 Notify RN      Comment 2 Documented in Chart     GLUCOSE, CAPILLARY      Status: Abnormal   Collection Time   03/07/12  6:35 AM      Component Value Range Comment   Glucose-Capillary 197 (*) 70 - 99 mg/dL   GLUCOSE, CAPILLARY     Status: Abnormal   Collection Time   03/07/12 12:08 PM      Component Value Range Comment   Glucose-Capillary 143 (*) 70 - 99 mg/dL   GLUCOSE, CAPILLARY     Status: Abnormal   Collection Time   03/07/12  4:49 PM      Component Value Range Comment   Glucose-Capillary 221 (*) 70 - 99 mg/dL   GLUCOSE, CAPILLARY     Status: Abnormal   Collection Time   03/07/12  9:44 PM      Component Value Range Comment   Glucose-Capillary 190 (*) 70 - 99 mg/dL   GLUCOSE, CAPILLARY     Status: Abnormal   Collection Time   03/08/12  6:30 AM      Component Value Range Comment   Glucose-Capillary 143 (*) 70 - 99 mg/dL   GLUCOSE, CAPILLARY     Status: Abnormal   Collection Time   03/08/12 11:34 AM      Component Value Range Comment   Glucose-Capillary 207 (*) 70 - 99 mg/dL     Physical Findings: AIMS: Facial and Oral Movements Muscles of Facial Expression: None, normal Lips and Perioral Area: None, normal Jaw: None, normal Tongue: None, normal,Extremity Movements Upper (arms, wrists, hands, fingers): None, normal Lower (legs, knees, ankles, toes): None, normal, Trunk Movements Neck, shoulders, hips: None, normal, Overall Severity Severity of abnormal movements (highest score from questions above): None, normal Incapacitation due to abnormal movements: None, normal Patient's awareness of abnormal movements (rate only patient's report): No Awareness, Dental Status Current problems with teeth and/or dentures?: No Does patient usually wear dentures?: No  CIWA:  CIWA-Ar Total: 0  COWS:  COWS Total Score: 0   Treatment Plan Summary: 1. Will consult IM regarding decreasing kidney functions.  2. Will D/C Indomethacin due to decreasing kidney function. 3. Will continue other medications as written. 4  Will order Lidocaine patch for low back  pain. 5. Will order Depakote to 1000mg  at hs. For mood stabilization. 6. Will increase patient's Zoloft to 75 mg at patient's request. 7. Will add Low dose Haldol Bid for anxiety. Plan: 1. IM consult for CKD. [name]. [name] PAC 03/08/2012, 2:27 PM

## 2012-03-08 NOTE — Progress Notes (Signed)
Patient ID: [name], female   DOB: [birth year], 56 y.o.   MRN: [id number] She has been up and to groups interacting with peers and staff. Self inventory: depression 8, hopelessness 8, denies SI thoughts and w/d symptoms. She is in bed asleep at this time.

## 2012-03-08 NOTE — Progress Notes (Signed)
Psychoeducational Group Note  Date:  03/08/2012 Time:1000am  Group Topic/Focus:  Identifying Needs:   The focus of this group is to help patients identify their personal needs that have been historically problematic and identify healthy behaviors to address their needs.  Participation Level:  Active  Participation Quality:  Appropriate  Affect:  Appropriate  Cognitive:  Appropriate  Insight:  Good  Engagement in Group:  Good  Additional Comments:    Valente David 03/08/2012,11:19 AM

## 2012-03-09 DIAGNOSIS — N179 Acute kidney failure, unspecified: Secondary | ICD-10-CM | POA: Diagnosis present

## 2012-03-09 DIAGNOSIS — G629 Polyneuropathy, unspecified: Secondary | ICD-10-CM | POA: Diagnosis present

## 2012-03-09 DIAGNOSIS — K5900 Constipation, unspecified: Secondary | ICD-10-CM | POA: Diagnosis present

## 2012-03-09 LAB — COMPREHENSIVE METABOLIC PANEL
ALT: 14 U/L (ref 0–35)
AST: 21 U/L (ref 0–37)
Calcium: 9.4 mg/dL (ref 8.4–10.5)
Creatinine, Ser: 1.5 mg/dL — ABNORMAL HIGH (ref 0.50–1.10)
GFR calc Af Amer: 45 mL/min — ABNORMAL LOW (ref >90)
Sodium: 137 mEq/L (ref 135–145)
Total Protein: 7.5 g/dL (ref 6.0–8.3)

## 2012-03-09 LAB — GLUCOSE, CAPILLARY
Glucose-Capillary: 276 mg/dL — ABNORMAL HIGH (ref 70–99)
Glucose-Capillary: 293 mg/dL — ABNORMAL HIGH (ref 70–99)

## 2012-03-09 MED ORDER — DOCUSATE SODIUM 100 MG PO CAPS
200.0000 mg | ORAL_CAPSULE | Freq: Every evening | ORAL | Status: DC | PRN
  Filled 2012-03-09 (×2): qty 1

## 2012-03-09 MED ORDER — HALOPERIDOL 1 MG PO TABS
1.0000 mg | ORAL_TABLET | Freq: Two times a day (BID) | ORAL | Status: DC
  Administered 2012-03-09 – 2012-03-12 (×6): 1 mg via ORAL
  Filled 2012-03-09 (×9): qty 1

## 2012-03-09 MED ORDER — AMLODIPINE BESYLATE 10 MG PO TABS
10.0000 mg | ORAL_TABLET | Freq: Every day | ORAL | Status: DC
  Administered 2012-03-10 – 2012-03-12 (×3): 10 mg via ORAL
  Filled 2012-03-09: qty 2
  Filled 2012-03-09 (×4): qty 1

## 2012-03-09 MED ORDER — HALOPERIDOL 0.5 MG PO TABS: 0.5000 mg | ORAL_TABLET | ORAL | Status: DC

## 2012-03-09 MED ORDER — DOCUSATE SODIUM 100 MG PO CAPS
100.0000 mg | ORAL_CAPSULE | Freq: Every day | ORAL | Status: DC
  Administered 2012-03-09 – 2012-03-12 (×4): 100 mg via ORAL
  Filled 2012-03-09 (×6): qty 1

## 2012-03-09 MED ORDER — LIDOCAINE 5 % EX PTCH
1.0000 | MEDICATED_PATCH | Freq: Two times a day (BID) | CUTANEOUS | Status: DC
  Administered 2012-03-09 – 2012-03-11 (×5): 1 via TRANSDERMAL
  Filled 2012-03-09 (×8): qty 1

## 2012-03-09 MED ORDER — GABAPENTIN 600 MG PO TABS
300.0000 mg | ORAL_TABLET | Freq: Two times a day (BID) | ORAL | Status: DC
  Administered 2012-03-09: 300 mg via ORAL
  Filled 2012-03-09 (×4): qty 0.5
  Filled 2012-03-09: qty 2

## 2012-03-09 NOTE — Consult Note (Signed)
Medical Consultation   MYSTICAL STORZ  ZOX:[id number]  DOB: Aug 21, 1955  DOA: 03/03/2012  PCP: Domingo Pulse, MD  Requesting physician: Psychiatry  Reason for consultation: Acute renal insufficiency (PLEASE NOTE THAT THE LABS WERE DONE ON 02/29/12)  History of Present Illness: Patient is a 56yo female with DM, HTN, HLP, hypothyroidism, gout admitted at [facility] for depression. Medicine consulted for elevated Cr function. Patient has no complaints except constipation. She has some chronic back pain and mild peripheral neuropathy.    Allergies:   Allergies  Allergen Reactions  . Metformin And Related Diarrhea  . Vicodin (Hydrocodone-Acetaminophen) Hives      Past Medical History  Diagnosis Date  . Asthma     past 10 years  . Arthritis     djd -lower back  . Diabetes mellitus     past 5 years  . Glaucoma(365)     dx 06/2011-[facility]  . Hypertension   . Hyperlipidemia   . Hyperthyroidism   . History of TIA (transient ischemic attack)     01/06/2011  . Pancreatitis   . Degenerative disc disease   . Gout   . Depression     Past Surgical History  Procedure Date  . Vaginal hysterectomy 1990    Social History:  reports that she has been smoking Cigarettes.  She has a 30 pack-year smoking history. She has never used smokeless tobacco. She reports that she uses illicit drugs ("Crack" cocaine). She reports that she does not drink alcohol.  Family History  Problem Relation Age of Onset  . Arthritis Mother   . Heart disease      mother & father  . Hypertension      mother & father  . Colon cancer Neg Hx   . Diabetes Mother   . Prostate cancer Neg Hx   . Breast cancer Neg Hx     Review of Systems:  Review of Systems:  Constitutional: Denies fever, chills, diaphoresis, appetite change and fatigue.  HEENT: Denies photophobia, eye pain, redness, hearing loss, ear pain, congestion, sore throat, rhinorrhea, sneezing, mouth sores, trouble swallowing, neck pain, neck  stiffness and tinnitus.   Respiratory: Denies SOB, DOE, cough, chest tightness,  and wheezing.   Cardiovascular: Denies chest pain, palpitations and leg swelling.  Gastrointestinal: Denies nausea, vomiting, abdominal pain, diarrhea, , blood in stool and abdominal distention. + constipation Genitourinary: Denies dysuria, urgency, frequency, hematuria, flank pain and difficulty urinating.  Musculoskeletal: Denies myalgias,  joint swelling, arthralgias and gait problem. Chronic back pain+ Skin: Denies pallor, rash and wound.  Neurological: Denies dizziness, seizures, syncope, weakness, light-headedness, numbness and headaches.  Hematological: Denies adenopathy. Easy bruising, personal or family bleeding history  Psychiatric/Behavioral: Denies suicidal ideation, mood changes, confusion, nervousness, sleep disturbance and agitation   Physical Exam: Blood pressure 125/84, pulse 98, temperature 98.2 F (36.8 C), temperature source Oral, resp. rate 16, height 4' 10.75" (1.492 m), weight 85.276 kg (188 lb).  General: Alert and awake, oriented x3, not in any acute distress. HEENT: normocephalic, atraumatic, anicteric sclera, pupils reactive to light and accommodation, EOMI, oropharynx clear CVS: S1-S2 clear, no murmur rubs or gallops Chest: clear to auscultation bilaterally, no wheezing, rales or rhonchi Abdomen: soft nontender, nondistended, normal bowel sounds, no organomegaly Extremities: no cyanosis, clubbing or edema noted bilaterally Neuro: Cranial nerves II-XII intact, no focal neurological deficits Psych: alert and oriented, stable mood and affect Skin: no rashes or lesions  Labs on Admission:  Basic Metabolic Panel: No results found for this basename: NA:2,K:2,CL:2,CO2:2,GLUCOSE:2,BUN:2,CREATININE:2,CALCIUM:2,MG:1,PHOS:1  in the last 168 hours Liver Function Tests: No results found for this basename: AST:2,ALT:2,ALKPHOS:2,BILITOT:2,PROT:2,ALBUMIN:2 in the last 168 hours No results found  for this basename: LIPASE:2,AMYLASE:2 in the last 168 hours No results found for this basename: AMMONIA:2 in the last 168 hours CBC: No results found for this basename: WBC:2,NEUTROABS:1,HGB:2,HCT:2,MCV:2,PLT:2 in the last 168 hours Cardiac Enzymes: No results found for this basename: CKTOTAL:3,CKMB:3,CKMBINDEX:3,TROPONINI:3 in the last 168 hours BNP: No components found with this basename: POCBNP:2 CBG:  Lab 03/09/12 1201 03/09/12 0614 03/08/12 2100 03/08/12 1646 03/08/12 1134  GLUCAP 276* 145* 181* 192* 207*    Inpatient Medications:   Scheduled Meds:   . aspirin EC  81 mg Oral Daily  . atorvastatin  20 mg Oral Daily  . brimonidine  1 drop Both Eyes BID  . busPIRone  15 mg Oral TID  . divalproex  1,000 mg Oral QHS  . gemfibrozil  600 mg Oral BID AC  . glipiZIDE  5 mg Oral QAC breakfast  . haloperidol  1 mg Oral BID  . insulin aspart  0-15 Units Subcutaneous TID WC  . insulin aspart  0-5 Units Subcutaneous QHS  . latanoprost  1 drop Both Eyes QHS  . levothyroxine  200 mcg Oral Daily  . lidocaine  1 patch Transdermal Q12H  . linagliptin  5 mg Oral Daily  . nicotine  21 mg Transdermal Q0600  . pantoprazole  40 mg Oral Daily  . pioglitazone  30 mg Oral Daily  . sertraline  75 mg Oral Daily  . traZODone  100 mg Oral QHS  . DISCONTD: allopurinol  100 mg Oral Daily  . DISCONTD: benazepril  20 mg Oral Daily  . DISCONTD: haloperidol  0.5 mg Oral BID  . DISCONTD: haloperidol  0.5 mg Oral BH-q7a  . DISCONTD: hydrochlorothiazide  25 mg Oral Daily  . DISCONTD: lidocaine  2 patch Transdermal Q24H  . DISCONTD: naproxen  500 mg Oral BID WC   Continuous Infusions:    Radiological Exams on Admission: No results found.  Impression/Recommendations Active Problems: 1) ? Mild Acute on CKD stage III: Patients baseline Cr is ~1.55 - PLEASE NOTE THAT LAST LABS WERE DONE ON 02/29/12 AND DOES NOT REFLECT ANY ACUTENESS. - Hold Lisinopril and Benazepril/Amlodipine combination, patient  doesn't need to be on two ACEI, she does not have any proteinuria. Hold naproxen. Allopurinol is already renally dosed. - Recheck her labs tomorrow evening as she has received all the meds today.    2) Constipation: add colace 3) Chronic back pain: added neurontin 4) Peripheral neuropathy: added neurontin 5) Diabetes Mellitus: fairly good control in-patient, no changes recommended 6) History of gout: stable 7) HTN: stable. I have added norvasc separately. 8) HLP: cont statin 9) Hypothyroidism: cont synthroid 10) History of TIA: continue ASA   I have made necessary changes, patient can follow up with her PCP out-patient when discharged from [facility]. I will sign off. Please call us if any additional questions.   Time Spent on Admission: 1 hour  Deyonte Cadden M.D. [facility] 03/09/2012, 4:55 PM

## 2012-03-09 NOTE — Progress Notes (Signed)
Psychoeducational Group Note  Date:  03/09/2012 Time:  1000am  Group Topic/Focus:  Making Healthy Choices:   The focus of this group is to help patients identify negative/unhealthy choices they were using prior to admission and identify positive/healthier coping strategies to replace them upon discharge.  Participation Level:  Active  Participation Quality:  Appropriate  Affect:  Appropriate  Cognitive:  Appropriate  Insight:  Good  Engagement in Group:  Good  Additional Comments:    Valente David 03/09/2012,10:16 AM

## 2012-03-09 NOTE — Progress Notes (Signed)
Patient ID: [name], female   DOB: 1956/03/29, 56 y.o.   MRN: [id number]  Pt. attended and participated in aftercare planning group. Pt. accepted information on suicide prevention, warning signs to look for with suicide and crisis line numbers to use. The pt. agreed to call crisis line numbers if having warning signs or having thoughts of suicide. Pt. listed their current anxiety level as 9 and their current depression level as 9. Pt shared that she had a rough night and didn't get much sleep. Pt shared that she dreamed a woman wearing white was standing at her bed. Pt shared that she has a criminal court date in [employer].

## 2012-03-09 NOTE — Progress Notes (Signed)
D.  Pt pleasant on approach, denied complaints.  Denies SI/HI/hallucinations at this time.  Interacting appropriately within milieu.  Had complained of constipation earlier.  A.  Gave Colace as ordered for constipation.  Support and encouragement offered.  States less anxiety, pleased with Haldol order.  R.  Resting in bed awake in no acute distress.  Will continue to monitor.

## 2012-03-09 NOTE — Progress Notes (Signed)
[facility] Group Notes:  (Counselor/Nursing/MHT/Case Management/Adjunct)  03/09/2012 10:54 AM  Type of Therapy:  Psychoeducational Skills  Participation Level:  Active  Participation Quality:  Appropriate, Attentive, Sharing and Supportive  Affect:  Appropriate  Cognitive:  Alert and Appropriate  Insight:  Good  Engagement in Group:  Good  Engagement in Therapy:  Good  Modes of Intervention:  Activity, Education and Support  Summary of Progress/Problems:Pt attended and participated in group where pts discussed "Ending the Cycle" from Sunday workbook. Pt discussed the relationship between her cycle of relapse and recovery in substance abuse and weight loss. Pt discussed key points of the stages of negative cycles.    [name] 03/09/2012, 10:54 AM

## 2012-03-09 NOTE — Progress Notes (Signed)
Patient ID: [name], female   DOB: 08/29/1955, 56 y.o.   MRN: [id number] 03-09-12 @ 1430 nursing adm note: D: she has no complaints other than some constipation. A: administered some MOM and got an order for colace 200 mg q hs prn. R: she had some moderate response but stated she still felt bloated. rn will monitor and q 15 min cks continue.

## 2012-03-09 NOTE — Progress Notes (Signed)
Patient ID: KAYDYNCE PAT, female   DOB: December 25, 1955, 56 y.o.   MRN: [id number]   [facility] Group Notes:  (Counselor/Nursing/MHT/Case Management/Adjunct)  03/09/2012 1:15 PM  Type of Therapy:  Group Therapy, Dance/Movement Therapy   Participation Level:  Did Not Attend     [name] 03/09/2012. 3:00 PM

## 2012-03-09 NOTE — Progress Notes (Signed)
Patient did not attend the evening speaker AA meeting. Pt was notified of meeting but remained in bed.   

## 2012-03-09 NOTE — Progress Notes (Signed)
[facility] MD Progress Note  03/09/2012 4:27 PM 6 Diagnosis:  Axis I: Schizoaffective Disorder  Axis II: No diagnosis  Axis III:  Past Medical History   Diagnosis  Date   .  Asthma      past 10 years   .  Arthritis      djd -lower back   .  Diabetes mellitus      past 5 years   .  Glaucoma(365)      dx 06/2011-[facility]   .  Hypertension    .  Hyperlipidemia    .  Hyperthyroidism    .  History of TIA (transient ischemic attack)      01/06/2011   .  Pancreatitis    .  Degenerative disc disease    .  Gout    .  Depression     Axis IV: other psychosocial or environmental problems  Axis V: 51-60 moderate symptoms     ADL's:  Impaired  Sleep: Fair  Appetite:  Fair  Suicidal Ideation:  Patient denies suicidal ideation intent or plans access or means. Homicidal Ideation:  Patient denies homicidal ideation intent or plans access or means.  Subjective: Met with [name] today and she reported that she didn't sleep well at all. She rates her depression as an 8. She notes that she gets anxious and rates her current level of anxiety as an 8 as well. She reports feelings of hopelessness at a 5.      She states she only got one pain patch last night and that her back pain is an 8-10.  AEB (as evidenced by): Subjective: Mental Status Examination/Evaluation:  Appearance: Disheveled  Eye Contact::  Good  Speech:  Clear and Coherent  Volume:  Normal  Mood:  Depressed  Affect:  Congruent  Thought Process:  Coherent  Orientation:  Full  Thought Content:  Hallucinations: Auditory  Suicidal Thoughts:  No  Homicidal Thoughts:  No  Memory:  Immediate;   Fair Recent;   Fair Remote;   Fair  Judgement:  Fair  Insight:  Present  Psychomotor Activity:  Normal  Concentration:  Fair  Recall:  Fair  Akathisia:  No  Handed:  Right  AIMS (if indicated):     Assets:  Communication Skills Desire for Improvement  Sleep:  Number of Hours: 4.5    Vital Signs:Blood pressure 125/84,  pulse 98, temperature 98.2 F (36.8 C), temperature source Oral, resp. rate 16, height 4' 10.75" (1.492 m), weight 85.276 kg (188 lb).  Objective: [name] is a pleasant cooperative straightforward individual who makes good eye contact and is interested in feeling better.  Current Medications: Current Facility-Administered Medications  Medication Dose Route Frequency Provider Last Rate Last Dose  . albuterol (PROVENTIL HFA;VENTOLIN HFA) 108 (90 BASE) MCG/ACT inhaler 2 puff  2 puff Inhalation Q6H PRN Kerry Hough, PA      . allopurinol (ZYLOPRIM) tablet 100 mg  100 mg Oral Daily Kerry Hough, PA   100 mg at 03/08/12 0846  . alum & mag hydroxide-simeth (MAALOX/MYLANTA) 200-200-20 MG/5ML suspension 30 mL  30 mL Oral Q4H PRN Kerry Hough, PA      . aspirin EC tablet 81 mg  81 mg Oral Daily Kerry Hough, PA   81 mg at 03/08/12 0846  . atorvastatin (LIPITOR) tablet 20 mg  20 mg Oral Daily Kerry Hough, PA   20 mg at 03/08/12 0846  . benazepril (LOTENSIN) tablet 20 mg  20  mg Oral Daily Himabindu Ravi, MD   20 mg at 03/08/12 0846  . brimonidine (ALPHAGAN) 0.2 % ophthalmic solution 1 drop  1 drop Both Eyes BID Kerry Hough, PA   1 drop at 03/08/12 0844  . busPIRone (BUSPAR) tablet 15 mg  15 mg Oral TID Kerry Hough, PA   15 mg at 03/08/12 1212  . colchicine tablet 0.6 mg  0.6 mg Oral QID PRN Kerry Hough, PA      . divalproex (DEPAKOTE ER) 24 hr tablet 500 mg  500 mg Oral QHS Kerry Hough, PA   500 mg at 03/07/12 2223  . gemfibrozil (LOPID) tablet 600 mg  600 mg Oral BID AC Kerry Hough, PA   600 mg at 03/08/12 1191  . glipiZIDE (GLUCOTROL) tablet 5 mg  5 mg Oral QAC breakfast Kerry Hough, PA   5 mg at 03/08/12 4782  . hydrochlorothiazide (HYDRODIURIL) tablet 25 mg  25 mg Oral Daily Kerry Hough, PA   25 mg at 03/08/12 0847  . hydrOXYzine (ATARAX/VISTARIL) tablet 25 mg  25 mg Oral BID PRN Himabindu Ravi, MD   25 mg at 03/07/12 2310  . insulin aspart (novoLOG)  injection 0-15 Units  0-15 Units Subcutaneous TID WC Kerry Hough, PA   5 Units at 03/08/12 1213  . insulin aspart (novoLOG) injection 0-5 Units  0-5 Units Subcutaneous QHS Kerry Hough, PA      . latanoprost (XALATAN) 0.005 % ophthalmic solution 1 drop  1 drop Both Eyes QHS Kerry Hough, PA   1 drop at 03/07/12 2223  . levothyroxine (SYNTHROID, LEVOTHROID) tablet 200 mcg  200 mcg Oral Daily Kerry Hough, PA   200 mcg at 03/08/12 0846  . linagliptin (TRADJENTA) tablet 5 mg  5 mg Oral Daily Kerry Hough, PA   5 mg at 03/08/12 0845  . magnesium hydroxide (MILK OF MAGNESIA) suspension 30 mL  30 mL Oral Daily PRN Kerry Hough, PA      . naproxen (NAPROSYN) tablet 500 mg  500 mg Oral BID WC Jorje Guild, PA-C   500 mg at 03/08/12 0846  . nicotine (NICODERM CQ - dosed in mg/24 hours) patch 21 mg  21 mg Transdermal Q0600 Kerry Hough, PA   21 mg at 03/08/12 9562  . pantoprazole (PROTONIX) EC tablet 40 mg  40 mg Oral Daily Kerry Hough, PA   40 mg at 03/08/12 0847  . pioglitazone (ACTOS) tablet 30 mg  30 mg Oral Daily Kerry Hough, PA   30 mg at 03/08/12 0845  . sertraline (ZOLOFT) tablet 25 mg  25 mg Oral Daily Himabindu Ravi, MD   25 mg at 03/08/12 0846  . traZODone (DESYREL) tablet 100 mg  100 mg Oral QHS Himabindu Ravi, MD   100 mg at 03/07/12 2223    Lab Results:  Results for orders placed during the hospital encounter of 03/03/12 (from the past 48 hour(s))  GLUCOSE, CAPILLARY     Status: Abnormal   Collection Time   03/06/12  5:04 PM      Component Value Range Comment   Glucose-Capillary 204 (*) 70 - 99 mg/dL    Comment 1 Notify RN     GLUCOSE, CAPILLARY     Status: Abnormal   Collection Time   03/06/12 10:08 PM      Component Value Range Comment   Glucose-Capillary 196 (*) 70 - 99 mg/dL    Comment 1  Notify RN      Comment 2 Documented in Chart     GLUCOSE, CAPILLARY     Status: Abnormal   Collection Time   03/07/12  6:35 AM      Component Value Range Comment    Glucose-Capillary 197 (*) 70 - 99 mg/dL   GLUCOSE, CAPILLARY     Status: Abnormal   Collection Time   03/07/12 12:08 PM      Component Value Range Comment   Glucose-Capillary 143 (*) 70 - 99 mg/dL   GLUCOSE, CAPILLARY     Status: Abnormal   Collection Time   03/07/12  4:49 PM      Component Value Range Comment   Glucose-Capillary 221 (*) 70 - 99 mg/dL   GLUCOSE, CAPILLARY     Status: Abnormal   Collection Time   03/07/12  9:44 PM      Component Value Range Comment   Glucose-Capillary 190 (*) 70 - 99 mg/dL   GLUCOSE, CAPILLARY     Status: Abnormal   Collection Time   03/08/12  6:30 AM      Component Value Range Comment   Glucose-Capillary 143 (*) 70 - 99 mg/dL   GLUCOSE, CAPILLARY     Status: Abnormal   Collection Time   03/08/12 11:34 AM      Component Value Range Comment   Glucose-Capillary 207 (*) 70 - 99 mg/dL    Labs: Labs are reviewed and noted that BUN is at the upper limits of normal creatinine is elevated, M.D. GFR is decreased to 37 and is trending downward. Physical Findings: AIMS: Facial and Oral Movements Muscles of Facial Expression: None, normal Lips and Perioral Area: None, normal Jaw: None, normal Tongue: None, normal,Extremity Movements Upper (arms, wrists, hands, fingers): None, normal Lower (legs, knees, ankles, toes): None, normal, Trunk Movements Neck, shoulders, hips: None, normal, Overall Severity Severity of abnormal movements (highest score from questions above): None, normal Incapacitation due to abnormal movements: None, normal Patient's awareness of abnormal movements (rate only patient's report): No Awareness, Dental Status Current problems with teeth and/or dentures?: No Does patient usually wear dentures?: No  CIWA:  CIWA-Ar Total: 0  COWS:  COWS Total Score: 0   Treatment Plan Summary: 1. Will consult IM a second time regarding decreasing kidney functions.  2. Will D/C Indomethacin due to decreasing kidney function. 3. Will continue  other medications as written. 4  Will order Lidocaine patch for low back pain. 5. Will order Continue Depakote to 1000mg  at hs. For mood stabilization. 6. Will increase patient's Zoloft to 75 mg at patient's request. 7. Will increase haldol for anxiety.  Plan: 1. IM consult for CKD. [name]. [name] PAC 03/09/2012, 4:27 PM

## 2012-03-10 DIAGNOSIS — G8929 Other chronic pain: Secondary | ICD-10-CM

## 2012-03-10 DIAGNOSIS — J45909 Unspecified asthma, uncomplicated: Secondary | ICD-10-CM

## 2012-03-10 DIAGNOSIS — G609 Hereditary and idiopathic neuropathy, unspecified: Secondary | ICD-10-CM

## 2012-03-10 DIAGNOSIS — K5900 Constipation, unspecified: Secondary | ICD-10-CM

## 2012-03-10 DIAGNOSIS — E119 Type 2 diabetes mellitus without complications: Secondary | ICD-10-CM

## 2012-03-10 DIAGNOSIS — M549 Dorsalgia, unspecified: Secondary | ICD-10-CM

## 2012-03-10 DIAGNOSIS — F329 Major depressive disorder, single episode, unspecified: Principal | ICD-10-CM

## 2012-03-10 DIAGNOSIS — E039 Hypothyroidism, unspecified: Secondary | ICD-10-CM

## 2012-03-10 DIAGNOSIS — E785 Hyperlipidemia, unspecified: Secondary | ICD-10-CM

## 2012-03-10 DIAGNOSIS — M109 Gout, unspecified: Secondary | ICD-10-CM

## 2012-03-10 DIAGNOSIS — K859 Acute pancreatitis without necrosis or infection, unspecified: Secondary | ICD-10-CM

## 2012-03-10 DIAGNOSIS — I10 Essential (primary) hypertension: Secondary | ICD-10-CM

## 2012-03-10 LAB — GLUCOSE, CAPILLARY
Glucose-Capillary: 118 mg/dL — ABNORMAL HIGH (ref 70–99)
Glucose-Capillary: 393 mg/dL — ABNORMAL HIGH (ref 70–99)

## 2012-03-10 MED ORDER — LEVOTHYROXINE SODIUM 100 MCG PO TABS
200.0000 ug | ORAL_TABLET | Freq: Every day | ORAL | Status: DC
  Administered 2012-03-11 – 2012-03-12 (×2): 200 ug via ORAL
  Filled 2012-03-10 (×3): qty 1

## 2012-03-10 MED ORDER — INSULIN ASPART 100 UNIT/ML ~~LOC~~ SOLN
4.0000 [IU] | Freq: Three times a day (TID) | SUBCUTANEOUS | Status: DC
  Administered 2012-03-10 – 2012-03-11 (×2): 4 [IU] via SUBCUTANEOUS

## 2012-03-10 MED ORDER — SODIUM POLYSTYRENE SULFONATE 15 GM/60ML PO SUSP
30.0000 g | Freq: Once | ORAL | Status: AC
  Administered 2012-03-10: 30 g via ORAL
  Filled 2012-03-10: qty 120

## 2012-03-10 MED ORDER — HYDROXYZINE HCL 50 MG PO TABS
50.0000 mg | ORAL_TABLET | Freq: Three times a day (TID) | ORAL | Status: DC | PRN
  Administered 2012-03-10: 50 mg via ORAL
  Filled 2012-03-10: qty 1

## 2012-03-10 MED ORDER — GABAPENTIN 300 MG PO CAPS
300.0000 mg | ORAL_CAPSULE | Freq: Two times a day (BID) | ORAL | Status: DC
  Administered 2012-03-10 – 2012-03-11 (×4): 300 mg via ORAL
  Filled 2012-03-10 (×8): qty 1

## 2012-03-10 NOTE — Progress Notes (Signed)
Pt attended discharge planning group and actively participated in group.  SW provided pt with today's workbook.  Pt presents with calm mood and affect.  Pt rates depression at a 1 and anxiety at a 5 today.  Pt denies SI/HI.  Pt states that her mood has improved.  Pt states that her pain is a lot better.  Pt states that she is able to return home but not until Wednesday.  Pt explained that they are painting her house and won't be done until Wednesday, and she won't be able to go back due to her asthma.  Pt is requesting to stay here until Wednesday and reports that she will be suicidal if we try to d/c her before Wednesday.  Pt has follow up scheduled at [facility] with Dr. Lolly Mustache and Orvan July for medication management and therapy.  No further needs voiced by pt at this time.  Safety planning and suicide prevention discussed.  Pt participated in discussion and acknowledged an understanding of the information provided.       Reyes Ivan, LCSWA 03/10/2012  10:20 AM

## 2012-03-10 NOTE — Progress Notes (Signed)
Patient attended MHT group. Writer used therapeutic ball to engage patient is social activities with peers  

## 2012-03-10 NOTE — Tx Team (Addendum)
Interdisciplinary Treatment Plan Update (Adult)  Date:  03/10/2012  Time Reviewed:  10:21 AM   Progress in Treatment: Attending groups: Yes Participating in groups:  Yes Taking medication as prescribed: Yes Tolerating medication:  Yes Family/Significant othe contact made:  Counselor assessing for appropriate contact Patient understands diagnosis:  Yes Discussing patient identified problems/goals with staff:  Yes Medical problems stabilized or resolved:  Yes Denies suicidal/homicidal ideation: Yes Issues/concerns per patient self-inventory:  None identified Other: N/A  New problem(s) identified: None Identified  Reason for Continuation of Hospitalization: Anxiety Depression Medication stabilization Withdrawal symptoms  Interventions implemented related to continuation of hospitalization: mood stabilization, medication monitoring and adjustment, group therapy and psycho education, safety checks q 15 mins  Additional comments: N/A  Estimated length of stay: 1-2 days  Discharge Plan: Pt is scheduled to follow up at [facility] for medication management and therapy.    New goal(s): N/A  Review of initial/current patient goals per problem list:    1.  Goal(s): Address substance use  Met:  No  Target date: by discharge  As evidenced by: completing detox protocol and refer to appropriate treatment   2. Goal (s): Reduce depressive and anxiety symptoms  Met: No  Target date: by discharge  As evidenced by: Reducing depression from a 10 to a 3 as reported by pt. Pt rates depression at a 1 and anxiety at a 5 today.   3. Goal(s): Eliminate SI  Met: Yes Target date: by discharge  As evidenced by: pt denying SI   Attendees: Patient:   03/10/2012 9:39 AM   Family:     Physician:  Patrick North, MD 03/10/2012 9:39 AM   Nursing: Alease Frame, RN 03/10/2012 9:39 AM   Case Manager:  Reyes Ivan, LCSWA 03/10/2012 9:39 AM   Counselor:  Ronda Fairly, LCSWA 03/10/2012 9:39 AM   Other:  Charlyne Mom, RN 03/10/2012 9:40 AM   Other:  Roswell Miners, RN 03/10/2012 9:39 AM   Other:  Jules Schick, RN 03/10/2012 9:40 AM   Other:      Scribe for Treatment Team:   Reyes Ivan 03/10/2012 10:21 AM

## 2012-03-10 NOTE — Progress Notes (Signed)
Patient ID: [name]  female  JYN:[id number]    DOB: [birth year]    DOA: 03/03/2012  PCP: Domingo Pulse, MD  Subjective: Patient asking about her pain medications (oxycodone), otherwise no complaints.  Objective: Weight change:  No intake or output data in the 24 hours ending 03/10/12 1736 Blood pressure 105/69, pulse 93, temperature 97.6 F (36.4 C), temperature source Oral, resp. rate 16, height 4' 10.75" (1.492 m), weight 85.276 kg (188 lb).  Physical Exam: General: Alert and awake, oriented x3, not in any acute distress. HEENT: anicteric sclera, pupils reactive to light and accommodation, EOMI CVS: S1-S2 clear, no murmur rubs or gallops Chest: clear to auscultation bilaterally, no wheezing, rales or rhonchi Abdomen: soft nontender, nondistended, normal bowel sounds, no organomegaly Extremities: no cyanosis, clubbing or edema noted bilaterally Neuro: Cranial nerves II-XII intact, no focal neurological deficits  Lab Results: Basic Metabolic Panel:  Lab 03/09/12 8657  NA 137  K 5.5*  CL 100  CO2 28  GLUCOSE 224*  BUN 23  CREATININE 1.50*  CALCIUM 9.4  MG --  PHOS --   Liver Function Tests:  Lab 03/09/12 1936  AST 21  ALT 14  ALKPHOS 94  BILITOT 0.2*  PROT 7.5  ALBUMIN 4.0  CBG:  Lab 03/10/12 1702 03/10/12 1153 03/10/12 0601 03/09/12 2116 03/09/12 1716  GLUCAP 190* 393* 118* 293* 89     Micro Results: No results found for this or any previous visit (from the past 240 hour(s)).  Studies/Results: No results found.  Medications: Scheduled Meds:   . amLODipine  10 mg Oral Daily  . aspirin EC  81 mg Oral Daily  . atorvastatin  20 mg Oral Daily  . brimonidine  1 drop Both Eyes BID  . busPIRone  15 mg Oral TID  . divalproex  1,000 mg Oral QHS  . docusate sodium  100 mg Oral Daily  . gabapentin  300 mg Oral BID  . gemfibrozil  600 mg Oral BID AC  . glipiZIDE  5 mg Oral QAC breakfast  . haloperidol  1 mg Oral BID  . insulin aspart  0-15 Units  Subcutaneous TID WC  . insulin aspart  0-5 Units Subcutaneous QHS  . insulin aspart  4 Units Subcutaneous TID WC  . latanoprost  1 drop Both Eyes QHS  . levothyroxine  200 mcg Oral QAC breakfast  . lidocaine  1 patch Transdermal Q12H  . linagliptin  5 mg Oral Daily  . nicotine  21 mg Transdermal Q0600  . pantoprazole  40 mg Oral Daily  . pioglitazone  30 mg Oral Daily  . sertraline  75 mg Oral Daily  . sodium polystyrene  30 g Oral Once  . traZODone  100 mg Oral QHS  . DISCONTD: gabapentin  300 mg Oral BID  . DISCONTD: levothyroxine  200 mcg Oral Daily   Continuous Infusions:    Assessment/Plan:  Active Problems: ) Mild Acute on CKD stage III: Patients baseline Cr is ~1.55, labs today shiow Cr at baseline -  Cont to Hold Lisinopril and Benazepril/Amlodipine combination, patient doesn't need to be on two ACEI, she does not have any proteinuria. Hold naproxen. Allopurinol is already renally dosed.   2) Constipation: cont colace   3) Chronic back pain: added neurontin. Patient is on Narcotics PRN at home, I will defer to primary service for that.   4) Peripheral neuropathy: added neurontin   5) Diabetes Mellitus: Hb A1C 7.9 -  I have added 4 units Novolog TID  meal coverage, cont current oral hypoglycemics. Ideally, she should be on long acting Insulin with Novolog, rather than 3 oral hypoglycemics. She is however comfortable with that, motivated to f/u with her PCP, Dr Cloyd Stagers Bonsu in 1-2 weeks. I would defer it to PCP for long term changes.       6) History of gout: stable   7) HTN: stable, on current regimen   8) HLP: cont statin   9) Hypothyroidism: TSH is 14.7, patient is on very high dose of synthroid daily -Patient states that her TSH was checked "couple of months ago", I tried to call Dr Rubye Oaks office for TSH level, however office is closed currently. Please obtain the records/thyroid panel from Dr Osei-Bonsu's office  [phone]).  - I ordered T4 and T3  levels.     10) History of TIA: continue ASA  11) HyperK: Kayexalate given     Disposition: per primary service   LOS: 7 days   RAI,RIPUDEEP M.D. [facility] 03/10/2012, 5:36 PM Pager: [phone]  If 7PM-7AM, please contact night-coverage [email or web address] Password [email or web address]

## 2012-03-10 NOTE — Progress Notes (Signed)
Patient ID: [name], female   DOB: 30-Jun-1955, 56 y.o.   MRN: [id number] D: Patient appears anxious about possible discharge. Will like to leave on Wednesday due to painting at residence.Cooperative with assessment. Pt stated sleep was "well" and appetite is "good". Pt rate depression was "2" and hopelessness "2". Denies SI/HI/AV and contract for safety at this time. Pt goal after discharge is "take meds on time and never drip cocaine". Offered no additional question or concerns  A: Medication given for anxiety. Safety has been maintained with Q15 minutes observation. Supported and encouragement provided.  R: Patient remain less anxious. Patient remains safe. She is complaint with medications. Safety has been maintained Q15 and continue current POC.

## 2012-03-10 NOTE — Progress Notes (Signed)
[facility] Group Notes:  (Counselor/Nursing/MHT/Case Management/Adjunct)   Type of Therapy:  Group Therapy 1:15 - 2:30  Participation Level:  Active  Participation Quality:  Attentive and Sharing  Affect:  Appropriate  Cognitive:  Confused  Insight:  Limited  Engagement in Group:  Good  Engagement in Therapy:  Good  Modes of Intervention:  Clarification, Education, Role-play and Support  Summary of Progress/Problems: Group discussion focused on what patient's see as their own obstacles to recovery.  Patient shared that comments by others are often obstacles for her and she was able to process how her reactions most frequently hurt her own self and the relationship.  [name] willingly role played two different scenarios with Clinical research associate and took suggestions from others in group.    Clide Dales 03/10/2012, 2:51 PM

## 2012-03-10 NOTE — Progress Notes (Signed)
Patient ID: [name], female   DOB: November 07, 1955, 56 y.o.   MRN: [id number] D: Pt. Lying in bed, no distress noted. Pt. Reports "just waiting for my house to get ready" Pt. Reports positive discharge support says BF going to be supportive. "he says he going to be going to  Meetings with me says he going to be going to [place]."  Pt. 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 Encouraged to go to group. Pt. Will be monitored q48min for safety. Writer encouraged pt. To continue recovery by attending meetings and being abstinent. R: Pt. Endorses positive support system. Pt. Attends group. Pt. Is safe on the unit.

## 2012-03-10 NOTE — Progress Notes (Signed)
Psychoeducational Group Note  Date:  03/10/2012 Time:  1100  Group Topic/Focus:  Wellness Toolbox:   The focus of this group is to discuss various aspects of wellness, balancing those aspects and exploring ways to increase the ability to experience wellness.  Patients will create a wellness toolbox for use upon discharge.  Participation Level:  Active  Participation Quality:  Appropriate, Sharing and Supportive  Affect:  Appropriate  Cognitive:  Appropriate  Insight:  Good  Engagement in Group:  Good  Additional Comments:  none  Katelynd Blauvelt M 03/10/2012, 1:57 PM

## 2012-03-10 NOTE — Progress Notes (Signed)
[facility] MD Progress Note                                         03/10/2012    [name] 1955-07-19    [id number]/[id number] Hospital day #7  Axis I.- Depression Dis NOS. Axis II- Deferred Axis III-   Asthma  past 10 years  .  Arthritis  djd -lower back  .  Diabetes mellitus  past 5 years  .  Glaucoma(365)  dx 06/2011-[facility]  .  Hypertension  .  Hyperlipidemia  .  Hypothyroidism  .  History of TIA (transient ischemic attack)  01/06/2011  .  Pancreatitis  .  Degenerative disc disease  .  Gout  .  Depression   Subjective: " I feel weak today."  Pt concerned about medications.  She wants to ensure all the medications are compatible.  She is concerned about the effect of the medications on her kidneys, and if her medication regiment will be altered to control her anxiety, mood, and blood sugar without causing additional problems with her kidney function.  Mood: " I am a little down today because of the medical things going on with me. I wanna make sure my meds are right. I am opening my mind to other medications for anxiety. In the past vistaril and navane didn't work to control my anxiety."  The patient was seen today and reports the following:  Sleep: "Haldol helped me get a good nights sleep." Appetite: Pt states appetite is good without problems  Mild (1-10) Severe   Depression (1-10): Pt states depression is low-" 2/10" Anxiety (1-10):  Anxiety  Ranges from 5-7/10. Pt admonishes not using other tools for anxiety. Concerned that current meds not controlling anxiety. Hopelessness (1-10): Pt states she is not hopeless. Thus gave it a "0"  Suicidal Ideation: The patient denies suicidal ideation. Plan: None Intent: None Means: None  Homicidal Ideation: The patient denies homicidal ideation. Plan: None Intent: None Means: None  Eye Contact:  Good General Appearance: Neat, with good hygeine Behavior:  Cooperative  Motor Behavior: Negative for  tics, tremors or pyramidal side effects. Speech: Clear, normal speed  Mental Status:  Orientation x 4. Level of Consciousness:  Fully  Alert, responsive Mood: Anxious Affect:Expansive, congruent   Thought Process: Logical/goal directed Thought Content: Normal. Negative for A/V hallucinations. Perception: WNL  Judgment: improving Insight: fair Cognition: Cognitively intact. No deficits noted.  VS: height is 4' 10.75" (1.492 m) and weight is 85.276 kg (188 lb). Her oral temperature is 97.6 F (36.4 C). Her blood pressure is 105/69 and her pulse is 93. Her respiration is 16.   FBS running high range from 293-393.  Current Medication:   . amLODipine  10 mg Oral Daily  . aspirin EC  81 mg Oral Daily  . atorvastatin  20 mg Oral Daily  . brimonidine  1 drop Both Eyes BID  . busPIRone  15 mg Oral TID  . divalproex  1,000 mg Oral QHS  . docusate sodium  100 mg Oral Daily  . gabapentin  300 mg Oral BID  . gemfibrozil  600 mg Oral BID AC  . glipiZIDE  5 mg Oral QAC breakfast  . haloperidol  1 mg Oral BID  . insulin aspart  0-15 Units Subcutaneous TID WC  . insulin aspart  0-5 Units Subcutaneous QHS  . latanoprost  1 drop  Both Eyes QHS  . levothyroxine  200 mcg Oral Daily  . lidocaine  1 patch Transdermal Q12H  . linagliptin  5 mg Oral Daily  . nicotine  21 mg Transdermal Q0600  . pantoprazole  40 mg Oral Daily  . pioglitazone  30 mg Oral Daily  . sertraline  75 mg Oral Daily  . traZODone  100 mg Oral QHS  . DISCONTD: allopurinol  100 mg Oral Daily  . DISCONTD: benazepril  20 mg Oral Daily  . DISCONTD: gabapentin  300 mg Oral BID  . DISCONTD: haloperidol  0.5 mg Oral BID  . DISCONTD: haloperidol  0.5 mg Oral BH-q7a  . DISCONTD: hydrochlorothiazide  25 mg Oral Daily  . DISCONTD: lidocaine  2 patch Transdermal Q24H  . DISCONTD: naproxen  500 mg Oral BID WC    Lab results:  Results for orders placed during the hospital encounter of 03/03/12 (from the past 48 hour(s))    GLUCOSE, CAPILLARY     Status: Abnormal   Collection Time   03/08/12  4:46 PM      Component Value Range Comment   Glucose-Capillary 192 (*) 70 - 99 mg/dL    Comment 1 Notify RN      Comment 2 Documented in Chart     GLUCOSE, CAPILLARY     Status: Abnormal   Collection Time   03/08/12  9:00 PM      Component Value Range Comment   Glucose-Capillary 181 (*) 70 - 99 mg/dL   GLUCOSE, CAPILLARY     Status: Abnormal   Collection Time   03/09/12  6:14 AM      Component Value Range Comment   Glucose-Capillary 145 (*) 70 - 99 mg/dL   GLUCOSE, CAPILLARY     Status: Abnormal   Collection Time   03/09/12 12:01 PM      Component Value Range Comment   Glucose-Capillary 276 (*) 70 - 99 mg/dL   GLUCOSE, CAPILLARY     Status: Normal   Collection Time   03/09/12  5:16 PM      Component Value Range Comment   Glucose-Capillary 89  70 - 99 mg/dL   COMPREHENSIVE METABOLIC PANEL     Status: Abnormal   Collection Time   03/09/12  7:36 PM      Component Value Range Comment   Sodium 137  135 - 145 mEq/L    Potassium 5.5 (*) 3.5 - 5.1 mEq/L    Chloride 100  96 - 112 mEq/L    CO2 28  19 - 32 mEq/L    Glucose, Bld 224 (*) 70 - 99 mg/dL    BUN 23  6 - 23 mg/dL    Creatinine, Ser 1.61 (*) 0.50 - 1.10 mg/dL    Calcium 9.4  8.4 - 09.6 mg/dL    Total Protein 7.5  6.0 - 8.3 g/dL    Albumin 4.0  3.5 - 5.2 g/dL    AST 21  0 - 37 U/L    ALT 14  0 - 35 U/L    Alkaline Phosphatase 94  39 - 117 U/L    Total Bilirubin 0.2 (*) 0.3 - 1.2 mg/dL    GFR calc non Af Amer 38 (*) >90 mL/min    GFR calc Af Amer 45 (*) >90 mL/min   GLUCOSE, CAPILLARY     Status: Abnormal   Collection Time   03/09/12  9:16 PM      Component Value Range Comment   Glucose-Capillary 293 (*)  70 - 99 mg/dL   GLUCOSE, CAPILLARY     Status: Abnormal   Collection Time   03/10/12  6:01 AM      Component Value Range Comment   Glucose-Capillary 118 (*) 70 - 99 mg/dL    Comment 1 Notify RN      Comment 2 Documented in Chart     GLUCOSE,  CAPILLARY     Status: Abnormal   Collection Time   03/10/12 11:53 AM      Component Value Range Comment   Glucose-Capillary 393 (*) 70 - 99 mg/dL    K+ level 5. 5, high TSH 03/04/12- elevated at  14.723   Group attendance: attending groups, participating.   ROS:    Constitutional: WDWN Adult in NAD   GI: Negative for N,V,D,+ constipation   Neuro: Negative for dizziness, visual changes, headaches   Resp: Negative for wheezing, SOB, cough   Cardio: Negative for CP, diaphoresis, fatigue   MSK: Negative for joint pain, swelling, DROM, or ambulatory difficulties, +intermittent backache  20 minutes spent with the patient discussing current symptoms, medication options, and treatment.  Additionally, Consultation with hospitalist and primary took approximately 15 min.  Treatment Summary: Pt to continue group therapy Pt to continue therapeutic milieu Pt to consider/research other nonpharmacologic treatment options for anxiety.   Treatment Plan: 1. Consulted with hospitalist regarding pts hyperkalemia, hypothryoidism with high TSH, and hyperglycemia. Per hospitalist, pt will have to consult with PCM when discharged regarding elevated TSH. 2. Medications ordered per hospitalist (Kayexolate/insulin). 3. Labs ordered T4/T3 by hospitalist. Chem 14 ordered for Friday to followup K level and renal function. 4. PRN Medications with magnesium in lieu of pts CKD discontinued.  5. Atarax 25 mg bidprn increased to Atarax 50 mg po tid prn.        Norval Gable 12:05pm 03/10/2012   Patient seen. Assessment and follow up plan discussed with NP , will continue to monitor patient`s lab results. Patient asymptomatic medically at this time, her abnormal labs most probably a result of long standing DM, HTN. Will work with hospitalist to address these issues and ensure patient has appropriate follow up with her Primary care doctor. Will consider discharge as patient begins to feel better in terms  of mood and anxiety. Her detox is complete.

## 2012-03-11 DIAGNOSIS — F341 Dysthymic disorder: Secondary | ICD-10-CM

## 2012-03-11 DIAGNOSIS — M109 Gout, unspecified: Secondary | ICD-10-CM

## 2012-03-11 LAB — GLUCOSE, CAPILLARY
Glucose-Capillary: 225 mg/dL — ABNORMAL HIGH (ref 70–99)
Glucose-Capillary: 291 mg/dL — ABNORMAL HIGH (ref 70–99)
Glucose-Capillary: 154 mg/dL — ABNORMAL HIGH (ref 70–99)
Glucose-Capillary: 177 mg/dL — ABNORMAL HIGH (ref 70–99)

## 2012-03-11 LAB — T3: T3, Total: 81.4 ng/dl (ref 80.0–204.0)

## 2012-03-11 MED ORDER — GABAPENTIN 600 MG PO TABS
300.0000 mg | ORAL_TABLET | Freq: Two times a day (BID) | ORAL | Status: DC
  Administered 2012-03-11: 300 mg via ORAL
  Filled 2012-03-11 (×2): qty 0.5

## 2012-03-11 MED ORDER — INSULIN ASPART 100 UNIT/ML ~~LOC~~ SOLN
6.0000 [IU] | Freq: Three times a day (TID) | SUBCUTANEOUS | Status: DC
  Administered 2012-03-11 – 2012-03-12 (×4): 6 [IU] via SUBCUTANEOUS

## 2012-03-11 NOTE — Progress Notes (Signed)
[facility] MD Progress Note                                         03/11/2012    [name] 10/09/1955    [id number] Hospital day #8   Axis I.- Depression Dis NOS.  Axis II- Deferred  Axis III-   1. Schizophrenia   2. Gout   3. DM (diabetes mellitus)   4. HTN (hypertension)   5. Hypothyroidism   6. AKI (acute kidney injury)   7. Chronic back pain   8. CKD (chronic kidney disease) stage 3, GFR 30-59 ml/min   9. Constipation   10. Other and unspecified hyperlipidemia   11. Peripheral neuropathy   12. Depression with anxiety   13. Asthma   14. Pancreatitis   15. Gout    Subjective:  "I've been felling good. I went to lunch, groups, and had a great day. I am  Feeling good. I have a little more energy." " My pain meds are working good, but in the morning I am really in pain, can we move the schedule around?" My anxiety is a "5/10", "I have racing thoughts, especially when I try to go to bed. That's why I try to read before I go to bed."  depression "0/10 Im not having any depression sxs", Hopelessness is "2/10" "Im hopefull, there are things I wanna do when I get home.  The patient was seen today and reports the following:  Sleep: Having racing thoughts keeping pt from going to sleep immediately. Appetite: Appetite is described as "good." Pt consulted with dietician today for DM education.  Mild (1-10) Severe  Depression (1-10): 0 Anxiety (1-10):  5/10 Hopelessness (1-10): 2/10  Suicidal Ideation: The patient denies suicidal ideation. Plan: None Intent: None Means: None  Homicidal Ideation: The patient denies homicidal ideation. Plan: None Intent: None Means: None  Eye Contact:  Normal, appropriate General Appearance: Neat, well groomed Behavior:  Cooperative  Motor Behavior: No tics, No tremmors Speech: Clear, normal volume  Mental Status:  Orientation x 4. Level of Consciousness:   Alert, follows commands. No distress Mood: Anxious Affect:  appropriate, congruent   Thought Process: logical, linear Thought Content: WNL, Negative for hallucinations, paranoia Perception: intact  Judgment: improving Insight: improving Cognition:  VS: height is 4' 10.75" (1.492 m) and weight is 85.276 kg (188 lb). Her oral temperature is 98.5 F (36.9 C). Her blood pressure is 134/84 and her pulse is 94. Her respiration is 16.    Current Medication:   . amLODipine  10 mg Oral Daily  . aspirin EC  81 mg Oral Daily  . atorvastatin  20 mg Oral Daily  . brimonidine  1 drop Both Eyes BID  . busPIRone  15 mg Oral TID  . divalproex  1,000 mg Oral QHS  . docusate sodium  100 mg Oral Daily  . gabapentin  300 mg Oral BID  . gemfibrozil  600 mg Oral BID AC  . glipiZIDE  5 mg Oral QAC breakfast  . haloperidol  1 mg Oral BID  . insulin aspart  0-15 Units Subcutaneous TID WC  . insulin aspart  0-5 Units Subcutaneous QHS  . insulin aspart  6 Units Subcutaneous TID WC  . latanoprost  1 drop Both Eyes QHS  . levothyroxine  200 mcg Oral QAC breakfast  . lidocaine  1 patch Transdermal Q12H  .  linagliptin  5 mg Oral Daily  . nicotine  21 mg Transdermal Q0600  . pantoprazole  40 mg Oral Daily  . pioglitazone  30 mg Oral Daily  . sertraline  75 mg Oral Daily  . traZODone  100 mg Oral QHS  . DISCONTD: insulin aspart  4 Units Subcutaneous TID WC    Lab results:  Results for orders placed during the hospital encounter of 03/03/12 (from the past 48 hour(s))  COMPREHENSIVE METABOLIC PANEL     Status: Abnormal   Collection Time   03/09/12  7:36 PM      Component Value Range Comment   Sodium 137  135 - 145 mEq/L    Potassium 5.5 (*) 3.5 - 5.1 mEq/L    Chloride 100  96 - 112 mEq/L    CO2 28  19 - 32 mEq/L    Glucose, Bld 224 (*) 70 - 99 mg/dL    BUN 23  6 - 23 mg/dL    Creatinine, Ser 1.61 (*) 0.50 - 1.10 mg/dL    Calcium 9.4  8.4 - 09.6 mg/dL    Total Protein 7.5  6.0 - 8.3 g/dL    Albumin 4.0  3.5 - 5.2 g/dL    AST 21  0 - 37 U/L    ALT 14  0 -  35 U/L    Alkaline Phosphatase 94  39 - 117 U/L    Total Bilirubin 0.2 (*) 0.3 - 1.2 mg/dL    GFR calc non Af Amer 38 (*) >90 mL/min    GFR calc Af Amer 45 (*) >90 mL/min   GLUCOSE, CAPILLARY     Status: Abnormal   Collection Time   03/09/12  9:16 PM      Component Value Range Comment   Glucose-Capillary 293 (*) 70 - 99 mg/dL   GLUCOSE, CAPILLARY     Status: Abnormal   Collection Time   03/10/12  6:01 AM      Component Value Range Comment   Glucose-Capillary 118 (*) 70 - 99 mg/dL    Comment 1 Notify RN      Comment 2 Documented in Chart     GLUCOSE, CAPILLARY     Status: Abnormal   Collection Time   03/10/12 11:53 AM      Component Value Range Comment   Glucose-Capillary 393 (*) 70 - 99 mg/dL   GLUCOSE, CAPILLARY     Status: Abnormal   Collection Time   03/10/12  5:02 PM      Component Value Range Comment   Glucose-Capillary 190 (*) 70 - 99 mg/dL   T3     Status: Normal   Collection Time   03/10/12  7:27 PM      Component Value Range Comment   T3, Total 81.4  80.0 - 204.0 ng/dl   T4, FREE     Status: Normal   Collection Time   03/10/12  7:27 PM      Component Value Range Comment   Free T4 1.42  0.80 - 1.80 ng/dL   GLUCOSE, CAPILLARY     Status: Abnormal   Collection Time   03/10/12  9:46 PM      Component Value Range Comment   Glucose-Capillary 314 (*) 70 - 99 mg/dL    Comment 1 Notify RN     GLUCOSE, CAPILLARY     Status: Abnormal   Collection Time   03/11/12  5:48 AM      Component Value Range Comment  Glucose-Capillary 225 (*) 70 - 99 mg/dL   GLUCOSE, CAPILLARY     Status: Abnormal   Collection Time   03/11/12 12:25 PM      Component Value Range Comment   Glucose-Capillary 291 (*) 70 - 99 mg/dL   GLUCOSE, CAPILLARY     Status: Abnormal   Collection Time   03/11/12  5:06 PM      Component Value Range Comment   Glucose-Capillary 154 (*) 70 - 99 mg/dL    Comment 1 Notify RN        Group attendance: Attending groups regularly.  ROS:    Constitutional:  WDWN Adult in NAD   GI: Negative for N,V,D,C   Neuro: Negative for dizziness, blurred vision, visual changes, headaches   Resp: Negative for wheezing, SOB, cough   Cardio: Negative for CP, diaphoresis, fatigue   MSK: Negative for joint pain, swelling, DROM, or ambulatory difficulties.  20 minutes- Amount of time was spent with the patient discussing the current symptoms and the response to treatment.  Treatment Summary:  1. Admit for crisis management and stabilization. 2. Manage medications to reduce current symptoms to base line and improve the patient's overall level of functioning. 3. Treat health problems as indicated. 4. Develop an appropriate treatment plan to decrease risk of relapse upon discharge and the need for readmission. 5. Psycho-social education regarding relapse prevention and self care. 6. Health care follow up as needed for medical problems. 7. Restart home medications where appropriate.  8. Discharge planning   Treatment Plan:  Will continue patient on current medication regiment. Will change neurontin schedule to bid 1000/2200 per pt request.     Norval Gable, FNP-BC 03/11/2012    Patient assessed, agree with above assessment and recommendations.

## 2012-03-11 NOTE — Progress Notes (Addendum)
Psychoeducational Group Note  Date:  03/11/2012 Time:  1000  Group Topic/Focus:  Recovery Goals:   The focus of this group is to identify appropriate goals for recovery and establish a plan to achieve them. Wisdom Activity Cards  Participation Level:  Active  Participation Quality:  Appropriate, Attentive and Sharing  Affect:  Appropriate  Cognitive:  Appropriate  Insight:  Good  Engagement in Group:  Good  Additional Comments:  Pt was given random two cards of Wisdom cards. Pt was given the opportunity to express ho cards chosen impact their life, and how it can become more into their life, and how recovery is apart of the wisdom cards in making choices that are healthy for pt life. Card the patient received were transform worrisome thoughts, pt stated she wanted to be more positive but still inform others on how she feels. Pt also received a card on forgiving yourself. Pt stated she wanted to forgive herself due to her alcoholism and that receiving help is how she beginning to forgive herself.   [name] 03/11/2012, 12:18 PM

## 2012-03-11 NOTE — Progress Notes (Signed)
Patient ID: [name]  female  ZOX:[id number]    DOB: April 06, 1956    DOA: 03/03/2012  PCP: Domingo Pulse, MD  Subjective: Patient states she doesn't always know what to eat or what not to eat with her diabetes. She had been on insulin before but her PCP took it off. She is looking forward to diabetic teaching here.  Objective: Weight change:  No intake or output data in the 24 hours ending 03/11/12 1251 Blood pressure 134/84, pulse 94, temperature 98.5 F (36.9 C), temperature source Oral, resp. rate 16, height 4' 10.75" (1.492 m), weight 85.276 kg (188 lb).  Physical Exam: General: Alert and awake, oriented x3, not in any acute distress. HEENT: anicteric sclera, pupils reactive to light and accommodation, EOMI CVS: S1-S2 clear, no murmur rubs or gallops Chest: clear to auscultation bilaterally, no wheezing, rales or rhonchi Abdomen: soft nontender, nondistended, normal bowel sounds, no organomegaly Extremities: no cyanosis, clubbing or edema noted bilaterally   Lab Results: Basic Metabolic Panel:  Lab 03/09/12 8119  NA 137  K 5.5*  CL 100  CO2 28  GLUCOSE 224*  BUN 23  CREATININE 1.50*  CALCIUM 9.4  MG --  PHOS --   Liver Function Tests:  Lab 03/09/12 1936  AST 21  ALT 14  ALKPHOS 94  BILITOT 0.2*  PROT 7.5  ALBUMIN 4.0  CBG:  Lab 03/11/12 1225 03/11/12 0548 03/10/12 2146 03/10/12 1702 03/10/12 1153  GLUCAP 291* 225* 314* 190* 393*     Micro Results: No results found for this or any previous visit (from the past 240 hour(s)).  Studies/Results: No results found.  Medications: Scheduled Meds:    . amLODipine  10 mg Oral Daily  . aspirin EC  81 mg Oral Daily  . atorvastatin  20 mg Oral Daily  . brimonidine  1 drop Both Eyes BID  . busPIRone  15 mg Oral TID  . divalproex  1,000 mg Oral QHS  . docusate sodium  100 mg Oral Daily  . gabapentin  300 mg Oral BID  . gemfibrozil  600 mg Oral BID AC  . glipiZIDE  5 mg Oral QAC breakfast  . haloperidol  1  mg Oral BID  . insulin aspart  0-15 Units Subcutaneous TID WC  . insulin aspart  0-5 Units Subcutaneous QHS  . insulin aspart  6 Units Subcutaneous TID WC  . latanoprost  1 drop Both Eyes QHS  . levothyroxine  200 mcg Oral QAC breakfast  . lidocaine  1 patch Transdermal Q12H  . linagliptin  5 mg Oral Daily  . nicotine  21 mg Transdermal Q0600  . pantoprazole  40 mg Oral Daily  . pioglitazone  30 mg Oral Daily  . sertraline  75 mg Oral Daily  . sodium polystyrene  30 g Oral Once  . traZODone  100 mg Oral QHS  . DISCONTD: insulin aspart  4 Units Subcutaneous TID WC  . DISCONTD: levothyroxine  200 mcg Oral Daily   Continuous Infusions:    Assessment/Plan:  Active Problems: ) Mild Acute on CKD stage III: Patients baseline Cr is ~1.55, lates labs today shiow Cr at baseline -  Cont to Hold Lisinopril and Benazepril/Amlodipine combination, patient doesn't need to be on two ACEI, she does not have any proteinuria. Hold naproxen. Allopurinol is already renally dosed.   2) Constipation:resolved, cont colace   3) Chronic back pain: cont neurontin. Patient is on Narcotics PRN at home, I will defer to primary service for that.  4) Peripheral neuropathy: added neurontin   5) Diabetes Mellitus: Hb A1C 7.9 -  Increased to 6 units Novolog TID meal coverage, cont current oral hypoglycemics. Ideally, she should be on long acting Insulin and Novolog, rather than 3 oral hypoglycemics.  -  Placed diabetic coordinator consult for diabetic teaching to the patient and carb control. - If she is going to be DC'ed soon in next 1-2 days, she can follow-up with her PCP, Dr Julio Sicks for long term glycemic control and change to insulin, otherwise we can try switch her to insulin here.        6) History of gout: stable   7) HTN: stable, on current regimen   8) HLP: cont statin   9) Hypothyroidism: TSH is 14.7, patient is on very high dose of synthroid daily -Patient states that her TSH was  checked "couple of months ago", I tried to call Dr Rubye Oaks office several times today for TSH level and left a detailed message to call me back with results, so far no response. Please obtain the records/thyroid panel from Dr Osei-Bonsu's office  [phone]).  - Free T4 and T3 levels checked are within normal limits.      10) History of TIA: continue ASA  11) HyperK: Kayexalate given, recheck labs     Disposition: per primary service   LOS: 8 days   Dam Ashraf M.D. [facility] 03/11/2012, 12:51 PM Pager: [phone]  If 7PM-7AM, please contact night-coverage [email or web address] Password [email or web address]

## 2012-03-11 NOTE — Progress Notes (Signed)
Inpatient Diabetes Program Recommendations  AACE/ADA: New Consensus Statement on Inpatient Glycemic Control (2013)  Target Ranges:  Prepandial:   less than 140 mg/dL      Peak postprandial:   less than 180 mg/dL (1-2 hours)      Critically ill patients:  140 - 180 mg/dL   Reason for Visit: Consult - Uncontrolled DM, diabetes education  Patient is a 56yo female with DM, HTN, HLP, hypothyroidism, gout admitted at [facility] for depression.  MD consult for diabetes education received.  Pt states she was on Lantus in 2010 but it was discontinued when blood sugars were improving.  "I've gotten off track with my diabetes and I need to get back on."  States she eats large amts of CHOs like potatoes, rice, crackers.  Doesn't eat desserts too often.  Has meter and strips at home but has not been checking.  States she needs to join the gym because she can get a free membership through [employer].  Has no problems getting meds.  Willing to take meal coverage insulin at home.  Results for Maria, Mathis (MRN [id number]) as of 03/11/2012 15:24  Ref. Range 03/09/2012 06:14 03/09/2012 12:01 03/09/2012 17:16 03/09/2012 21:16 03/10/2012 06:01 03/10/2012 11:53 03/10/2012 17:02 03/10/2012 21:46 03/11/2012 05:48 03/11/2012 12:25  Glucose-Capillary Latest Range: 70-99 mg/dL 782 (H) 956 (H) 89 213 (H) 118 (H) 393 (H) 190 (H) 314 (H) 225 (H) 291 (H)  Results for Maria, Mathis (MRN [id number]) as of 03/11/2012 15:24  Ref. Range 03/09/2012 19:36  Sodium Latest Range: 135-145 mEq/L 137  Potassium Latest Range: 3.5-5.1 mEq/L 5.5 (H)  Chloride Latest Range: 96-112 mEq/L 100  CO2 Latest Range: 19-32 mEq/L 28  BUN Latest Range: 6-23 mg/dL 23  Creatinine Latest Range: 0.50-1.10 mg/dL 6.29 (H)  Calcium Latest Range: 8.4-10.5 mg/dL 9.4  GFR calc non Af Amer Latest Range: >90 mL/min 38 (L)  GFR calc Af Amer Latest Range: >90 mL/min 45 (L)  Glucose Latest Range: 70-99 mg/dL 528 (H)  Alkaline Phosphatase Latest Range:  39-117 U/L 94  Albumin Latest Range: 3.5-5.2 g/dL 4.0  AST Latest Range: 0-37 U/L 21  ALT Latest Range: 0-35 U/L 14  Total Protein Latest Range: 6.0-8.3 g/dL 7.5  Total Bilirubin Latest Range: 0.3-1.2 mg/dL 0.2 (L)  Results for Maria, Mathis (MRN [id number]) as of 03/11/2012 15:24  Ref. Range 03/04/2012 06:21  Hemoglobin A1C Latest Range: <5.7 % 7.9 (H)    Assessment/Plan:  CBGs tend to be good in am and then progress upward throughout the day.  Pt eating lots of snacks here at [facility].  Has been unmotivated to make lifestyle changes with diet and exercise in order to improve glycemic control. Willing to start controlling sugars at home. Very knowledgeable regarding hypoglycemia, monitoring and diet.  Had questions on CHO counting. Gave Living Well with Diabetes packet, CHO counting information and Eating Out info.  Lengthy discussion regarding importance of controlling blood sugars, reducing HgbA1C and adhering to CHO mod diet.  Verbalized understanding of hypoglycemia symptoms and treatment.  Recommendations:  Follow CHO mod diet - will order OP Diabetes Education consult.  Pt willing to go and this will be scheduled after discharge.  Continue with OHAs at d/c. Add Novolog 6 units tidwc for home.  F/U with PCP in 1 week.  Take logbook with CBGs for MD to assess.  Pt seems very motivated to make changes and wants to control blood sugars to prevent long-term complications. Discussed above with RN.  Thank  you.  Ailene Ards, RD, LDN, CDE Inpatient Diabetes Coordinator [phone]

## 2012-03-11 NOTE — Progress Notes (Signed)
Patient ID: [name], female   DOB: 1956-01-04, 56 y.o.   MRN: [id number] D: Patient mood is happy and excited about possible discharge tomorrow. Calm and cooperative with assessment. Patient stated on self inventory  "to better care of myself"  requestred diabetic education before discharge. Patient requested to self administer insulin.  Patient  reports sleep was "well". Appetite is "good". Pt rates depression  was "1" and hopelessness was "1" out of 10 scale. Patient denies SI/HI/AV and contract for safety at this time. Offered no additional question or concerns.  A: Diabetic coordinator Ailene Ards contacted to see patient. Patient administered insulin safely. Safety has been maintained with Q15 minutes observation. Supported and encouragement provided.  R:Patient excited about consultation and stated to "take better of her self and eat better". Patient  interacting appropriately within milieu. Patient remains safe. She is complaint with medications. Safety has been maintained Q15 and continue current POC.

## 2012-03-11 NOTE — Progress Notes (Signed)
[facility] Group Notes:  (Counselor/Nursing/MHT/Case Management/Adjunct)  03/11/2012 2:31 PM  Type of Therapy:  Psychoeducational Skills  Participation Level:  Active  Participation Quality:  Attentive  Affect:  Appropriate  Cognitive:  Confused  Insight:  Limited  Engagement in Group:  Good  Engagement in Therapy:  Limited  Modes of Intervention:  Problem-solving  Summary of Progress/Problems: Pt. Was attentive and participated co-facilitated by the mental health association, sometimes sounding confused but tried to stay on topic of feelings related to relapse.    Jone Baseman 03/11/2012, 2:31 PM

## 2012-03-11 NOTE — Progress Notes (Signed)
Pt did not attend d/c planning group on this date.  SW met with pt individually at this time.  Pt presents with bright mood and affect. Pt rates depression at a 1 and anxiety at a 0 today.  Pt denies SI/HI.  Pt states that she feels happy and hopeful today.  Pt states that she feels her medications are working and is excited to be off of narcotics.  Pt states that she is open to following up with Dr. Lolly Mustache and states that she didn't want to go back to him before because she couldn't have her way with getting certain medications.  Pt states that now that she sees she can be off of narcotics and feel great she is fine with going back to Dr. Lolly Mustache.  Pt's follow up is scheduled.  No further needs voiced by pt at this time.    [name], LCSWA 03/11/2012  9:59 AM

## 2012-03-12 MED ORDER — ASPIRIN 81 MG PO TBEC: 81.0000 mg | DELAYED_RELEASE_TABLET | Freq: Every day | ORAL | Status: DC

## 2012-03-12 MED ORDER — GABAPENTIN 300 MG PO CAPS
300.0000 mg | ORAL_CAPSULE | Freq: Two times a day (BID) | ORAL | Status: DC
  Administered 2012-03-12: 300 mg via ORAL
  Filled 2012-03-12 (×3): qty 1

## 2012-03-12 MED ORDER — FENOFIBRATE 54 MG PO TABS: 54.0000 mg | ORAL_TABLET | Freq: Every day | ORAL | Status: DC

## 2012-03-12 MED ORDER — LISINOPRIL 40 MG PO TABS: 40.0000 mg | ORAL_TABLET | Freq: Every day | ORAL | Status: DC

## 2012-03-12 MED ORDER — LEVOTHYROXINE SODIUM 200 MCG PO TABS: 200.0000 ug | ORAL_TABLET | Freq: Every day | ORAL | Status: DC

## 2012-03-12 MED ORDER — DIVALPROEX SODIUM ER 500 MG PO TB24: 1000.0000 mg | ORAL_TABLET | Freq: Every day | ORAL | Status: DC

## 2012-03-12 MED ORDER — TRAZODONE HCL 100 MG PO TABS: 100.0000 mg | ORAL_TABLET | Freq: Every day | ORAL | Status: DC

## 2012-03-12 MED ORDER — PIOGLITAZONE HCL 30 MG PO TABS: 30.0000 mg | ORAL_TABLET | Freq: Every day | ORAL | Status: DC

## 2012-03-12 MED ORDER — BUSPIRONE HCL 15 MG PO TABS: 15.0000 mg | ORAL_TABLET | Freq: Three times a day (TID) | ORAL | Status: DC

## 2012-03-12 MED ORDER — AMLODIPINE BESYLATE 10 MG PO TABS: 10.0000 mg | ORAL_TABLET | Freq: Every day | ORAL | Status: DC

## 2012-03-12 MED ORDER — GEMFIBROZIL 600 MG PO TABS: 600.0000 mg | ORAL_TABLET | Freq: Two times a day (BID) | ORAL | Status: DC

## 2012-03-12 MED ORDER — GLIPIZIDE 5 MG PO TABS: 5.0000 mg | ORAL_TABLET | Freq: Every day | ORAL | Status: DC

## 2012-03-12 MED ORDER — SERTRALINE HCL 25 MG PO TABS: 75.0000 mg | ORAL_TABLET | Freq: Every day | ORAL | Status: DC

## 2012-03-12 MED ORDER — ALLOPURINOL 100 MG PO TABS: 100.0000 mg | ORAL_TABLET | Freq: Every day | ORAL | Status: DC

## 2012-03-12 MED ORDER — ATORVASTATIN CALCIUM 20 MG PO TABS: 20.0000 mg | ORAL_TABLET | Freq: Every day | ORAL | Status: DC

## 2012-03-12 MED ORDER — HYDROCHLOROTHIAZIDE 25 MG PO TABS: 25.0000 mg | ORAL_TABLET | Freq: Every day | ORAL | Status: DC

## 2012-03-12 MED ORDER — SERTRALINE HCL 25 MG PO TABS
75.0000 mg | ORAL_TABLET | Freq: Every day | ORAL | Status: DC
  Filled 2012-03-12: qty 42

## 2012-03-12 MED ORDER — LINAGLIPTIN 5 MG PO TABS: 5.0000 mg | ORAL_TABLET | Freq: Every day | ORAL | Status: DC

## 2012-03-12 MED ORDER — INSULIN ASPART 100 UNIT/ML ~~LOC~~ SOLN: 0.0000 [IU] | Freq: Three times a day (TID) | SUBCUTANEOUS | Status: DC

## 2012-03-12 MED ORDER — BRIMONIDINE TARTRATE 0.1 % OP SOLN: 1.0000 [drp] | Freq: Every day | OPHTHALMIC | Status: DC

## 2012-03-12 NOTE — Tx Team (Signed)
Interdisciplinary Treatment Plan Update (Adult)  Date:  03/12/2012  Time Reviewed:  9:51 AM   Progress in Treatment: Attending groups: Yes Participating in groups:  Yes Taking medication as prescribed: Yes Tolerating medication:  Yes Family/Significant othe contact made:   Patient understands diagnosis:  Yes Discussing patient identified problems/goals with staff:  Yes Medical problems stabilized or resolved:  Yes Denies suicidal/homicidal ideation: Yes Issues/concerns per patient self-inventory:  None identified Other: N/A  New problem(s) identified: None Identified  Reason for Continuation of Hospitalization: Stable to d/c  Interventions implemented related to continuation of hospitalization: Stable to d/c  Additional comments: N/A  Estimated length of stay: D/C today  Discharge Plan: Pt will follow up with [facility] for medication management and therapy.    New goal(s): N/A  Review of initial/current patient goals per problem list:    1.  Goal(s): Address substance use  Met:  Yes  Target date: by discharge  As evidenced by: completed detox protocol and referred to appropriate treatment  2.  Goal (s): Reduce depressive and anxiety symptoms  Met:  Yes  Target date: by discharge  As evidenced by: Reducing depression from a 10 to a 3 as reported by pt.  Pt rates at a 1 today.   3.  Goal(s): Eliminate SI  Met:  Yes  Target date: by discharge  As evidenced by: Pt denies SI.    Attendees: Patient:  [name]  03/12/2012 9:51 AM   Family:     Physician:  Patrick North, MD 03/12/2012 9:51 AM   Nursing: Roswell Miners, RN 03/12/2012 9:51 AM   Case Manager:  Reyes Ivan, LCSWA 03/12/2012 9:51 AM   Counselor:  Ronda Fairly, LCSWA 03/12/2012 9:51 AM   Other:  Chinita Greenland, RN 03/12/2012 9:53 AM   Other:  Charlyne Mom, RN 03/12/2012 9:54 AM   Other:  Jules Schick, RN 03/12/2012 9:53 AM   Other:  Everardo Beals, NP     Scribe for Treatment Team:   Reyes Ivan 03/12/2012 9:51 AM

## 2012-03-12 NOTE — [facility] Suicide Risk Assessment (Signed)
Suicide Risk Assessment  Discharge Assessment     Demographic Factors:  Female, AA, unemployed  Mental Status Per Nursing Assessment::   On Admission:  Suicidal ideation indicated by patient;Suicide plan;Self-harm thoughts;Self-harm behaviors;Thoughts of violence towards others;Intention to act on suicide plan  Current Mental Status by Physician: Patient is alert, oriented to all spheres. Speech normal. Thinking logical. Denies AH/Vh/SI/HI. Fair insight and judgement.  Loss Factors: Financial problems/change in socioeconomic status  Historical Factors: Impulsivity  Risk Reduction Factors:   Positive social support and Positive therapeutic relationship  Continued Clinical Symptoms:  Dysthymia  Cognitive Features That Contribute To Risk:  Cognitively intact  Suicide Risk:  Minimal: No identifiable suicidal ideation.  Patients presenting with no risk factors but with morbid ruminations; may be classified as minimal risk based on the severity of the depressive symptoms  Discharge Diagnoses:   AXIS I:  Depressive Disorder NOS AXIS II:  No diagnosis AXIS III:   Past Medical History  Diagnosis Date  . Asthma     past 10 years  . Arthritis     djd -lower back  . Diabetes mellitus     past 5 years  . Glaucoma(365)     dx 06/2011-[facility]  . Hypertension   . Hyperlipidemia   . Hyperthyroidism   . History of TIA (transient ischemic attack)     01/06/2011  . Pancreatitis   . Degenerative disc disease   . Gout   . Depression    AXIS IV:  other psychosocial or environmental problems AXIS V:  61-70 mild symptoms  Plan Of Care/Follow-up recommendations:  Activity:  normal Diet:  normal Follow up with outpatient appointments.  Is patient on multiple antipsychotic therapies at discharge:  No   Has Patient had three or more failed trials of antipsychotic monotherapy by history:  No  Recommended Plan for Multiple Antipsychotic Therapies: NA  Maysoon Lozada,  Naela Nodal 03/12/2012, 10:11 AM

## 2012-03-12 NOTE — Progress Notes (Signed)
Patient ID: [name], female   DOB: 1955-09-28, 56 y.o.   MRN: [id number] Patient appears calm and cooperative, denies SI/HI upon discharge.  Patient  provided with discharge medication instructions, diabetic teaching and verbalized understanding. All personal items in locker returned to patient. Patient states she will comply with OP services and medications as prescribed. Patient escorted to lobby.

## 2012-03-12 NOTE — Progress Notes (Signed)
[facility] Case Management Discharge Plan:  Will you be returning to the same living situation after discharge: Yes,  returning home At discharge, do you have transportation home?:Yes,  access to transportation Do you have the ability to pay for your medications:Yes,  access to meds  Interagency Information:     Release of information consent forms completed and in the chart;  Patient's signature needed at discharge.  Patient to Follow up at:  Follow-up Information    Follow up with [facility] - Outpatient . On 04/01/2012. (Appointment scheduled at 9:00 am with Dr. Lolly Mustache)    Contact information:   [address] [place], [place] [address] [phone]      Follow up with [facility] - Outpatient. On 04/08/2012. (Appointment scheduled at 10:30 am with Orvan July (therapy))    Contact information:   [address] [place], [place] [address] [phone]         Patient denies SI/HI:   Yes,  denies SI/HI today    Safety Planning and Suicide Prevention discussed:  Yes,  discussed with pt today  Barrier to discharge identified:No.  Summary and Recommendations: Pt attended discharge planning group and actively participated in group.  SW provided pt with today's workbook.  Pt presents with bright mood and affect.  Pt rates depression and anxiety at a 1 today.  Pt denies SI/HI.  Pt reports feeling stable to d/c today.  No recommendations from SW.  No further needs voiced by pt.  Pt stable to discharge.     Carmina Miller 03/12/2012, 9:40 AM
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
## 2012-03-12 NOTE — Progress Notes (Signed)
D: Pt in bed resting with eyes closed. Respirations even and unlabored. Pt appears to be in no signs of distress at this time. A: Q15min checks remains for this pt. R: Pt remains safe at this time.   

## 2012-03-12 NOTE — Progress Notes (Signed)
Patient ID: [name], female   DOB: 1955/11/17, 56 y.o.   MRN: [id number]  D:  Pt was smiling and informed the writer that she's ready for discharge. Stated she plans to continue with her treatment/rehab.   A: Adm scheduled meds. Support and encouragement was offered. 15 min checks were continued for safety.   R:  Safety was maintained.

## 2012-03-14 NOTE — Progress Notes (Signed)
Patient Discharge Instructions:  After Visit Summary (AVS):   Access to EMR:  03/14/2012 Psychiatric Admission Assessment Note:   Access to EMR:  03/14/2012 Suicide Risk Assessment - Discharge Assessment:   Access to EMR:  03/14/2012 Next Level Care Provider Has Access to the EMR, 03/14/2012  Records provided via [facility] access to [facility] O/P Dr Lolly Mustache and Orvan July.  Wandra Scot, 03/14/2012, 4:34 PM

## 2012-04-01 ENCOUNTER — Ambulatory Visit (HOSPITAL_COMMUNITY): Payer: Medicare Other | Admitting: Psychiatry

## 2012-04-04 ENCOUNTER — Emergency Department (HOSPITAL_COMMUNITY)
Admission: EM | Admit: 2012-04-04 | Discharge: 2012-04-04 | Discharge status: Against Medical Advice | Payer: 59 | Attending: Emergency Medicine | Admitting: Emergency Medicine

## 2012-04-04 ENCOUNTER — Encounter (HOSPITAL_COMMUNITY): Payer: Self-pay | Admitting: Emergency Medicine

## 2012-04-04 DIAGNOSIS — F17200 Nicotine dependence, unspecified, uncomplicated: Secondary | ICD-10-CM | POA: Insufficient documentation

## 2012-04-04 DIAGNOSIS — R51 Headache: Secondary | ICD-10-CM | POA: Insufficient documentation

## 2012-04-04 DIAGNOSIS — Z79899 Other long term (current) drug therapy: Secondary | ICD-10-CM | POA: Insufficient documentation

## 2012-04-04 DIAGNOSIS — Z794 Long term (current) use of insulin: Secondary | ICD-10-CM | POA: Insufficient documentation

## 2012-04-04 DIAGNOSIS — Z7982 Long term (current) use of aspirin: Secondary | ICD-10-CM | POA: Insufficient documentation

## 2012-04-04 NOTE — ED Notes (Signed)
Patient called 3x, no answer for vital signs. Pt not found in waiting room.

## 2012-04-04 NOTE — ED Notes (Signed)
Pt c/o right sided HA after falling 2 days ago; pt sts some increased HA and nausea today

## 2012-04-08 ENCOUNTER — Encounter (HOSPITAL_COMMUNITY): Payer: Self-pay | Admitting: Licensed Clinical Social Worker

## 2012-04-08 ENCOUNTER — Ambulatory Visit (HOSPITAL_COMMUNITY): Payer: Self-pay | Admitting: Licensed Clinical Social Worker

## 2012-04-08 NOTE — Progress Notes (Unsigned)
Patient ID: [name], female   DOB: [birth year], 56 y.o.   MRN: [id number] Patient was a no show/no call for appointment.

## 2012-04-14 DIAGNOSIS — J45909 Unspecified asthma, uncomplicated: Secondary | ICD-10-CM | POA: Insufficient documentation

## 2012-04-14 DIAGNOSIS — E785 Hyperlipidemia, unspecified: Secondary | ICD-10-CM | POA: Insufficient documentation

## 2012-07-03 ENCOUNTER — Encounter (HOSPITAL_COMMUNITY): Payer: Self-pay | Admitting: Emergency Medicine

## 2012-07-03 ENCOUNTER — Emergency Department (HOSPITAL_COMMUNITY)
Admission: EM | Admit: 2012-07-03 | Discharge: 2012-07-03 | Disposition: A | Discharge status: Home / Self Care | Payer: PRIVATE HEALTH INSURANCE | Attending: Emergency Medicine | Admitting: Emergency Medicine

## 2012-07-03 ENCOUNTER — Emergency Department (HOSPITAL_COMMUNITY): Payer: PRIVATE HEALTH INSURANCE

## 2012-07-03 DIAGNOSIS — Z79899 Other long term (current) drug therapy: Secondary | ICD-10-CM | POA: Insufficient documentation

## 2012-07-03 DIAGNOSIS — F3289 Other specified depressive episodes: Secondary | ICD-10-CM | POA: Insufficient documentation

## 2012-07-03 DIAGNOSIS — Z8673 Personal history of transient ischemic attack (TIA), and cerebral infarction without residual deficits: Secondary | ICD-10-CM | POA: Insufficient documentation

## 2012-07-03 DIAGNOSIS — Z8739 Personal history of other diseases of the musculoskeletal system and connective tissue: Secondary | ICD-10-CM | POA: Insufficient documentation

## 2012-07-03 DIAGNOSIS — I129 Hypertensive chronic kidney disease with stage 1 through stage 4 chronic kidney disease, or unspecified chronic kidney disease: Secondary | ICD-10-CM | POA: Insufficient documentation

## 2012-07-03 DIAGNOSIS — R079 Chest pain, unspecified: Secondary | ICD-10-CM | POA: Insufficient documentation

## 2012-07-03 DIAGNOSIS — Z862 Personal history of diseases of the blood and blood-forming organs and certain disorders involving the immune mechanism: Secondary | ICD-10-CM | POA: Insufficient documentation

## 2012-07-03 DIAGNOSIS — Z7982 Long term (current) use of aspirin: Secondary | ICD-10-CM | POA: Insufficient documentation

## 2012-07-03 DIAGNOSIS — N183 Chronic kidney disease, stage 3 unspecified: Secondary | ICD-10-CM | POA: Insufficient documentation

## 2012-07-03 DIAGNOSIS — R05 Cough: Secondary | ICD-10-CM | POA: Insufficient documentation

## 2012-07-03 DIAGNOSIS — F329 Major depressive disorder, single episode, unspecified: Secondary | ICD-10-CM | POA: Insufficient documentation

## 2012-07-03 DIAGNOSIS — E119 Type 2 diabetes mellitus without complications: Secondary | ICD-10-CM | POA: Insufficient documentation

## 2012-07-03 DIAGNOSIS — H409 Unspecified glaucoma: Secondary | ICD-10-CM | POA: Insufficient documentation

## 2012-07-03 DIAGNOSIS — Z8639 Personal history of other endocrine, nutritional and metabolic disease: Secondary | ICD-10-CM | POA: Insufficient documentation

## 2012-07-03 DIAGNOSIS — R059 Cough, unspecified: Secondary | ICD-10-CM | POA: Insufficient documentation

## 2012-07-03 DIAGNOSIS — J45901 Unspecified asthma with (acute) exacerbation: Secondary | ICD-10-CM | POA: Insufficient documentation

## 2012-07-03 DIAGNOSIS — R5383 Other fatigue: Secondary | ICD-10-CM

## 2012-07-03 DIAGNOSIS — R112 Nausea with vomiting, unspecified: Secondary | ICD-10-CM | POA: Insufficient documentation

## 2012-07-03 DIAGNOSIS — E0590 Thyrotoxicosis, unspecified without thyrotoxic crisis or storm: Secondary | ICD-10-CM | POA: Insufficient documentation

## 2012-07-03 DIAGNOSIS — Z8719 Personal history of other diseases of the digestive system: Secondary | ICD-10-CM | POA: Insufficient documentation

## 2012-07-03 DIAGNOSIS — R109 Unspecified abdominal pain: Secondary | ICD-10-CM | POA: Insufficient documentation

## 2012-07-03 DIAGNOSIS — M109 Gout, unspecified: Secondary | ICD-10-CM | POA: Insufficient documentation

## 2012-07-03 DIAGNOSIS — R0602 Shortness of breath: Secondary | ICD-10-CM | POA: Insufficient documentation

## 2012-07-03 DIAGNOSIS — F17200 Nicotine dependence, unspecified, uncomplicated: Secondary | ICD-10-CM | POA: Insufficient documentation

## 2012-07-03 DIAGNOSIS — R5381 Other malaise: Secondary | ICD-10-CM | POA: Insufficient documentation

## 2012-07-03 LAB — BASIC METABOLIC PANEL
BUN: 10 mg/dL (ref 6–23)
CO2: 25 mEq/L (ref 19–32)
Calcium: 9.6 mg/dL (ref 8.4–10.5)
Creatinine, Ser: 1.2 mg/dL — ABNORMAL HIGH (ref 0.50–1.10)
Glucose, Bld: 319 mg/dL — ABNORMAL HIGH (ref 70–99)

## 2012-07-03 LAB — CBC WITH DIFFERENTIAL/PLATELET
Basophils Absolute: 0 10*3/uL (ref 0.0–0.1)
Eosinophils Relative: 1 % (ref 0–5)
HCT: 39.1 % (ref 36.0–46.0)
Hemoglobin: 13.1 g/dL (ref 12.0–15.0)
Lymphocytes Relative: 26 % (ref 12–46)
Lymphs Abs: 1.9 10*3/uL (ref 0.7–4.0)
MCV: 83.2 fL (ref 78.0–100.0)
Monocytes Absolute: 0.4 10*3/uL (ref 0.1–1.0)
Monocytes Relative: 5 % (ref 3–12)
RDW: 16.2 % — ABNORMAL HIGH (ref 11.5–15.5)
WBC: 7.2 10*3/uL (ref 4.0–10.5)

## 2012-07-03 LAB — HEPATIC FUNCTION PANEL
Albumin: 3.6 g/dL (ref 3.5–5.2)
Total Bilirubin: 0.3 mg/dL (ref 0.3–1.2)

## 2012-07-03 LAB — POCT I-STAT TROPONIN I: Troponin i, poc: 0 ng/mL (ref 0.00–0.08)

## 2012-07-03 MED ORDER — ONDANSETRON HCL 4 MG/2ML IJ SOLN
4.0000 mg | Freq: Once | INTRAMUSCULAR | Status: AC
  Administered 2012-07-03: 4 mg via INTRAVENOUS
  Filled 2012-07-03: qty 2

## 2012-07-03 MED ORDER — MORPHINE SULFATE 4 MG/ML IJ SOLN
4.0000 mg | INTRAMUSCULAR | Status: DC | PRN
  Administered 2012-07-03: 4 mg via INTRAVENOUS
  Filled 2012-07-03: qty 1

## 2012-07-03 MED ORDER — OXYCODONE-ACETAMINOPHEN 5-325 MG PO TABS: ORAL_TABLET | ORAL | Status: DC

## 2012-07-03 NOTE — ED Provider Notes (Signed)
Medical screening examination/treatment/procedure(s) were performed by non-physician practitioner and as supervising physician I was immediately available for consultation/collaboration.  Derwood Kaplan, MD 07/03/12 [phone]

## 2012-07-03 NOTE — ED Provider Notes (Signed)
History     CSN: [id number]  Arrival date & time 07/03/12  1208   First MD Initiated Contact with Patient 07/03/12 1312      Chief Complaint  Patient presents with  . Weakness  . Cough    (Consider location/radiation/quality/duration/timing/severity/associated sxs/prior treatment) HPI  [name] is a 58 y.o. female complaining of left-sided chest pain that radiates around to the back intermittently over the last 3 days it is associated with shortness of breath, intermittently productive cough and fatigue.  Patient had single episode of nonbloody nonbilious emesis starting Monday night.  She has a past medical history significant for chronic pancreatitis.  She states that she does not drink.  This pain is different in location, severity and character than her normal pancreatitis outbreaks. 7/10, pain is positional exacerbated by palpation.   Past Medical History  Diagnosis Date  . Asthma     past 10 years  . Arthritis     djd -lower back  . Diabetes mellitus     past 5 years  . Glaucoma(365)     dx 06/2011-[facility]  . Hypertension   . Hyperlipidemia   . Hyperthyroidism   . History of TIA (transient ischemic attack)     01/06/2011  . Pancreatitis   . Degenerative disc disease   . Gout   . Depression     Past Surgical History  Procedure Date  . Vaginal hysterectomy 1990    Family History  Problem Relation Age of Onset  . Arthritis Mother   . Heart disease      mother & father  . Hypertension      mother & father  . Colon cancer Neg Hx   . Diabetes Mother   . Prostate cancer Neg Hx   . Breast cancer Neg Hx     History  Substance Use Topics  . Smoking status: Current Every Day Smoker -- 1.0 packs/day for 30 years    Types: Cigarettes  . Smokeless tobacco: Never Used  . Alcohol Use: No    OB History    Grav Para Term Preterm Abortions TAB SAB Ect Mult Living                  Review of Systems  Constitutional: Positive for fatigue.  Negative for fever.  Respiratory: Positive for cough. Negative for shortness of breath.   Cardiovascular: Positive for chest pain.  Gastrointestinal: Positive for nausea and abdominal pain. Negative for vomiting and diarrhea.  All other systems reviewed and are negative.    Allergies  Metformin and related and Vicodin  Home Medications   Current Outpatient Rx  Name  Route  Sig  Dispense  Refill  . ALLOPURINOL 100 MG PO TABS   Oral   Take 300 mg by mouth daily. For gout         . ASPIRIN 81 MG PO TBEC   Oral   Take 1 tablet (81 mg total) by mouth daily. For platelet aggregation   30 tablet   0   . BRIMONIDINE TARTRATE 0.1 % OP SOLN   Both Eyes   Place 1 drop into both eyes at bedtime. For allergic rhinitis         . BUSPIRONE HCL 15 MG PO TABS   Oral   Take 1 tablet (15 mg total) by mouth 3 (three) times daily. For anxiety   90 tablet   0   . COLCHICINE 0.6 MG PO TABS   Oral  Take 0.6 mg by mouth daily as needed. For gout flare ups         . DIVALPROEX SODIUM ER 500 MG PO TB24   Oral   Take 2 tablets (1,000 mg total) by mouth at bedtime. For mood stabilization   60 tablet   0   . GEMFIBROZIL 600 MG PO TABS   Oral   Take 1 tablet (600 mg total) by mouth 2 (two) times daily before a meal. For diabetes   60 tablet   0   . HYDROCHLOROTHIAZIDE 25 MG PO TABS   Oral   Take 1 tablet (25 mg total) by mouth daily. For high bp   30 tablet   0   . LATANOPROST 0.005 % OP SOLN   Both Eyes   Place 1 drop into both eyes at bedtime.         Marland Kitchen LEVOTHYROXINE SODIUM 200 MCG PO TABS   Oral   Take 1 tablet (200 mcg total) by mouth daily before breakfast. For hypothyroiduism   30 tablet   0   . LISINOPRIL 40 MG PO TABS   Oral   Take 1 tablet (40 mg total) by mouth daily. For high bp   30 tablet   0   . METRONIDAZOLE 500 MG PO TABS   Oral   Take 500 mg by mouth 3 (three) times daily.         . OXYCODONE-ACETAMINOPHEN 7.5-325 MG PO TABS   Oral   Take 1  tablet by mouth 3 (three) times daily. As needed for back pain         . PANTOPRAZOLE SODIUM 40 MG PO TBEC   Oral   Take 40 mg by mouth daily.         . SERTRALINE HCL 25 MG PO TABS   Oral   Take 50 mg by mouth daily.         Marland Kitchen GLIPIZIDE 5 MG PO TABS   Oral   Take 1 tablet (5 mg total) by mouth daily before breakfast. For diabetes   30 tablet   0   . TRAZODONE HCL 100 MG PO TABS   Oral   Take 1 tablet (100 mg total) by mouth at bedtime. insomnia   30 tablet   0     BP 164/102  Pulse 78  Temp 97.8 F (36.6 C) (Oral)  Resp 18  SpO2 100%  Physical Exam  Nursing note and vitals reviewed. Constitutional: She is oriented to person, place, and time. She appears well-developed and well-nourished. No distress.  HENT:  Head: Normocephalic and atraumatic.  Right Ear: External ear normal.  Mouth/Throat: Oropharynx is clear and moist.  Eyes: Conjunctivae normal and EOM are normal. Pupils are equal, round, and reactive to light.  Neck: Normal range of motion. No JVD present.  Cardiovascular: Normal rate, regular rhythm and intact distal pulses.   Pulmonary/Chest: Effort normal and breath sounds normal. No stridor. No respiratory distress. She has no wheezes. She has no rales. She exhibits tenderness.       Reproducible tenderness to palpation of the left lower anterior chest  Abdominal: Soft. Bowel sounds are normal. She exhibits no distension and no mass. There is no tenderness. There is no rebound and no guarding.  Musculoskeletal: Normal range of motion.  Neurological: She is alert and oriented to person, place, and time.  Psychiatric: She has a normal mood and affect.    ED Course  Procedures (including critical care  time)  Labs Reviewed  CBC WITH DIFFERENTIAL - Abnormal; Notable for the following:    RDW 16.2 (*)     All other components within normal limits  BASIC METABOLIC PANEL - Abnormal; Notable for the following:    Potassium 3.4 (*)     Glucose, Bld 319  (*)     Creatinine, Ser 1.20 (*)     GFR calc non Af Amer 50 (*)     GFR calc Af Amer 58 (*)     All other components within normal limits  HEPATIC FUNCTION PANEL - Abnormal; Notable for the following:    Alkaline Phosphatase 127 (*)     All other components within normal limits  LIPASE, BLOOD  POCT I-STAT TROPONIN I   Dg Chest 2 View  07/03/2012  *RADIOLOGY REPORT*  Clinical Data: Shortness of breath.  CHEST - 2 VIEW  Comparison: 08/30/2010.  Findings: The cardiac silhouette, mediastinal and hilar contours are normal and stable.  The lungs are clear.  No pleural effusion. The bony thorax is intact.  IMPRESSION: No acute cardiopulmonary findings.   Original Report Authenticated By: Rudie Meyer, M.D.      Date: 07/03/2012  Rate: 73  Rhythm: normal sinus rhythm  QRS Axis: normal  Intervals: normal  ST/T Wave abnormalities: normal  Conduction Disutrbances:none  Narrative Interpretation:   Old EKG Reviewed: unchanged    1. CKD (chronic kidney disease) stage 3, GFR 30-59 ml/min   2. Fatigue   3. Cough   4. Chest pain       MDM  Patient with weakness, cough, reproducible chest pain.  EKG is nonischemic, troponin is negative.  Blood work is normal except for mildly elevated alkaline phosphatase.  Vital signs are stable the patient has primary care followup.  I doubt any acute process at this time.  I will discharge her with pain control and to return precautions.  Filed Vitals:   07/03/12 1221  BP: 164/102  Pulse: 78  Temp: 97.8 F (36.6 C)  TempSrc: Oral  Resp: 18  SpO2: 100%          Wynetta Emery, PA-C 07/03/12 1518

## 2012-07-03 NOTE — ED Notes (Signed)
Pt states that for a few days approx 3 days has been feeling weak not like her normal tired feeling with a dry cough. Alert x4,

## 2012-07-19 ENCOUNTER — Encounter (HOSPITAL_COMMUNITY): Payer: Self-pay | Admitting: Emergency Medicine

## 2012-07-19 ENCOUNTER — Inpatient Hospital Stay (HOSPITAL_COMMUNITY)
Admission: EM | Admit: 2012-07-19 | Discharge: 2012-07-21 | DRG: 440 | Disposition: A | Discharge status: Home / Self Care | Payer: PRIVATE HEALTH INSURANCE | Attending: Internal Medicine | Admitting: Internal Medicine

## 2012-07-19 DIAGNOSIS — E039 Hypothyroidism, unspecified: Secondary | ICD-10-CM | POA: Diagnosis present

## 2012-07-19 DIAGNOSIS — M549 Dorsalgia, unspecified: Secondary | ICD-10-CM | POA: Diagnosis present

## 2012-07-19 DIAGNOSIS — F411 Generalized anxiety disorder: Secondary | ICD-10-CM | POA: Diagnosis present

## 2012-07-19 DIAGNOSIS — M109 Gout, unspecified: Secondary | ICD-10-CM | POA: Diagnosis present

## 2012-07-19 DIAGNOSIS — G609 Hereditary and idiopathic neuropathy, unspecified: Secondary | ICD-10-CM | POA: Diagnosis present

## 2012-07-19 DIAGNOSIS — G8929 Other chronic pain: Secondary | ICD-10-CM | POA: Diagnosis present

## 2012-07-19 DIAGNOSIS — G629 Polyneuropathy, unspecified: Secondary | ICD-10-CM

## 2012-07-19 DIAGNOSIS — F329 Major depressive disorder, single episode, unspecified: Secondary | ICD-10-CM | POA: Diagnosis present

## 2012-07-19 DIAGNOSIS — I10 Essential (primary) hypertension: Secondary | ICD-10-CM | POA: Diagnosis present

## 2012-07-19 DIAGNOSIS — K859 Acute pancreatitis without necrosis or infection, unspecified: Principal | ICD-10-CM | POA: Diagnosis present

## 2012-07-19 DIAGNOSIS — F3289 Other specified depressive episodes: Secondary | ICD-10-CM | POA: Diagnosis present

## 2012-07-19 DIAGNOSIS — F341 Dysthymic disorder: Secondary | ICD-10-CM

## 2012-07-19 DIAGNOSIS — F17200 Nicotine dependence, unspecified, uncomplicated: Secondary | ICD-10-CM | POA: Diagnosis present

## 2012-07-19 DIAGNOSIS — E1149 Type 2 diabetes mellitus with other diabetic neurological complication: Secondary | ICD-10-CM | POA: Diagnosis present

## 2012-07-19 DIAGNOSIS — F209 Schizophrenia, unspecified: Secondary | ICD-10-CM | POA: Diagnosis present

## 2012-07-19 DIAGNOSIS — E119 Type 2 diabetes mellitus without complications: Secondary | ICD-10-CM | POA: Diagnosis present

## 2012-07-19 DIAGNOSIS — F418 Other specified anxiety disorders: Secondary | ICD-10-CM

## 2012-07-19 LAB — CBC
Hemoglobin: 13.3 g/dL (ref 12.0–15.0)
MCHC: 33.5 g/dL (ref 30.0–36.0)
RDW: 16.3 % — ABNORMAL HIGH (ref 11.5–15.5)

## 2012-07-19 LAB — HEMOGLOBIN A1C: Hgb A1c MFr Bld: 12.7 % — ABNORMAL HIGH (ref <5.7)

## 2012-07-19 LAB — LIPID PANEL
HDL: 52 mg/dL (ref >39)
LDL Cholesterol: 160 mg/dL — ABNORMAL HIGH (ref 0–99)
Total CHOL/HDL Ratio: 5.1 RATIO

## 2012-07-19 LAB — PROTIME-INR: INR: 0.98 (ref 0.00–1.49)

## 2012-07-19 LAB — TSH: TSH: 91.672 u[IU]/mL — ABNORMAL HIGH (ref 0.350–4.500)

## 2012-07-19 LAB — MAGNESIUM: Magnesium: 1.7 mg/dL (ref 1.5–2.5)

## 2012-07-19 LAB — COMPREHENSIVE METABOLIC PANEL
AST: 20 U/L (ref 0–37)
Albumin: 3.8 g/dL (ref 3.5–5.2)
Calcium: 8.9 mg/dL (ref 8.4–10.5)
Chloride: 98 mEq/L (ref 96–112)
Creatinine, Ser: 1.1 mg/dL (ref 0.50–1.10)

## 2012-07-19 LAB — ETHANOL: Alcohol, Ethyl (B): <11 mg/dL (ref 0–11)

## 2012-07-19 LAB — POCT I-STAT TROPONIN I: Troponin i, poc: 0 ng/mL (ref 0.00–0.08)

## 2012-07-19 LAB — GLUCOSE, CAPILLARY
Glucose-Capillary: 152 mg/dL — ABNORMAL HIGH (ref 70–99)
Glucose-Capillary: 280 mg/dL — ABNORMAL HIGH (ref 70–99)

## 2012-07-19 MED ORDER — FENTANYL CITRATE 0.05 MG/ML IJ SOLN
50.0000 ug | Freq: Once | INTRAMUSCULAR | Status: AC
  Administered 2012-07-19: 50 ug via INTRAVENOUS
  Filled 2012-07-19: qty 2

## 2012-07-19 MED ORDER — PANTOPRAZOLE SODIUM 40 MG IV SOLR
40.0000 mg | INTRAVENOUS | Status: DC
  Administered 2012-07-19 – 2012-07-20 (×2): 40 mg via INTRAVENOUS
  Filled 2012-07-19 (×3): qty 40

## 2012-07-19 MED ORDER — LISINOPRIL 40 MG PO TABS
40.0000 mg | ORAL_TABLET | Freq: Every day | ORAL | Status: DC
  Administered 2012-07-19 – 2012-07-20 (×2): 40 mg via ORAL
  Filled 2012-07-19 (×3): qty 1

## 2012-07-19 MED ORDER — PANTOPRAZOLE SODIUM 40 MG IV SOLR
40.0000 mg | Freq: Once | INTRAVENOUS | Status: AC
  Administered 2012-07-19: 40 mg via INTRAVENOUS
  Filled 2012-07-19: qty 40

## 2012-07-19 MED ORDER — LEVOTHYROXINE SODIUM 200 MCG PO TABS
200.0000 ug | ORAL_TABLET | Freq: Every day | ORAL | Status: DC
  Administered 2012-07-20 – 2012-07-21 (×2): 200 ug via ORAL
  Filled 2012-07-19 (×3): qty 1

## 2012-07-19 MED ORDER — FENTANYL CITRATE 0.05 MG/ML IJ SOLN
50.0000 ug | INTRAMUSCULAR | Status: DC | PRN
  Administered 2012-07-19 (×2): 50 ug via INTRAVENOUS
  Filled 2012-07-19 (×2): qty 2

## 2012-07-19 MED ORDER — GEMFIBROZIL 600 MG PO TABS
600.0000 mg | ORAL_TABLET | Freq: Two times a day (BID) | ORAL | Status: DC
  Administered 2012-07-19 – 2012-07-21 (×3): 600 mg via ORAL
  Filled 2012-07-19 (×7): qty 1

## 2012-07-19 MED ORDER — ASPIRIN 81 MG PO CHEW
81.0000 mg | CHEWABLE_TABLET | Freq: Every day | ORAL | Status: DC
  Administered 2012-07-19 – 2012-07-21 (×3): 81 mg via ORAL
  Filled 2012-07-19 (×3): qty 1

## 2012-07-19 MED ORDER — INSULIN ASPART 100 UNIT/ML ~~LOC~~ SOLN
0.0000 [IU] | Freq: Three times a day (TID) | SUBCUTANEOUS | Status: DC
Start: 2012-07-19 — End: 2012-07-21
  Administered 2012-07-20 – 2012-07-21 (×4): 3 [IU] via SUBCUTANEOUS
  Administered 2012-07-21: 2 [IU] via SUBCUTANEOUS

## 2012-07-19 MED ORDER — ONDANSETRON HCL 4 MG PO TABS: 4.0000 mg | ORAL_TABLET | Freq: Four times a day (QID) | ORAL | Status: DC | PRN

## 2012-07-19 MED ORDER — INSULIN ASPART 100 UNIT/ML ~~LOC~~ SOLN
0.0000 [IU] | Freq: Every day | SUBCUTANEOUS | Status: DC
  Administered 2012-07-19: 3 [IU] via SUBCUTANEOUS

## 2012-07-19 MED ORDER — FENTANYL CITRATE 0.05 MG/ML IJ SOLN
100.0000 ug | Freq: Once | INTRAMUSCULAR | Status: AC
  Administered 2012-07-19: 100 ug via INTRAVENOUS
  Filled 2012-07-19: qty 2

## 2012-07-19 MED ORDER — SERTRALINE HCL 50 MG PO TABS
50.0000 mg | ORAL_TABLET | Freq: Every day | ORAL | Status: DC
  Administered 2012-07-19 – 2012-07-21 (×3): 50 mg via ORAL
  Filled 2012-07-19 (×3): qty 1

## 2012-07-19 MED ORDER — ONDANSETRON HCL 4 MG/2ML IJ SOLN
4.0000 mg | Freq: Once | INTRAMUSCULAR | Status: AC
  Administered 2012-07-19: 4 mg via INTRAVENOUS
  Filled 2012-07-19: qty 2

## 2012-07-19 MED ORDER — SODIUM CHLORIDE 0.9 % IV BOLUS (SEPSIS)
1000.0000 mL | Freq: Once | INTRAVENOUS | Status: AC
  Administered 2012-07-19: 1000 mL via INTRAVENOUS

## 2012-07-19 MED ORDER — TRAZODONE HCL 100 MG PO TABS
100.0000 mg | ORAL_TABLET | Freq: Every day | ORAL | Status: DC
  Administered 2012-07-19: 100 mg via ORAL
  Filled 2012-07-19 (×3): qty 1

## 2012-07-19 MED ORDER — LATANOPROST 0.005 % OP SOLN
1.0000 [drp] | Freq: Every day | OPHTHALMIC | Status: DC
  Administered 2012-07-19 – 2012-07-20 (×2): 1 [drp] via OPHTHALMIC
  Filled 2012-07-19 (×2): qty 2.5

## 2012-07-19 MED ORDER — DIVALPROEX SODIUM ER 500 MG PO TB24
1000.0000 mg | ORAL_TABLET | Freq: Every day | ORAL | Status: DC
  Administered 2012-07-19 – 2012-07-20 (×2): 1000 mg via ORAL
  Filled 2012-07-19 (×3): qty 2

## 2012-07-19 MED ORDER — COLCHICINE 0.6 MG PO TABS: 0.6000 mg | ORAL_TABLET | Freq: Every day | ORAL | Status: DC | PRN

## 2012-07-19 MED ORDER — BRIMONIDINE TARTRATE 0.15 % OP SOLN
1.0000 [drp] | Freq: Every day | OPHTHALMIC | Status: DC
  Administered 2012-07-19 – 2012-07-20 (×2): 1 [drp] via OPHTHALMIC
  Filled 2012-07-19: qty 5

## 2012-07-19 MED ORDER — FLUTICASONE PROPIONATE HFA 44 MCG/ACT IN AERO
1.0000 | INHALATION_SPRAY | Freq: Two times a day (BID) | RESPIRATORY_TRACT | Status: DC
  Administered 2012-07-19 – 2012-07-21 (×4): 1 via RESPIRATORY_TRACT
  Filled 2012-07-19: qty 10.6

## 2012-07-19 MED ORDER — NICOTINE 14 MG/24HR TD PT24
14.0000 mg | MEDICATED_PATCH | TRANSDERMAL | Status: DC
  Administered 2012-07-20 – 2012-07-21 (×2): 14 mg via TRANSDERMAL
  Filled 2012-07-19 (×3): qty 1

## 2012-07-19 MED ORDER — ONDANSETRON HCL 4 MG/2ML IJ SOLN: 4.0000 mg | Freq: Four times a day (QID) | INTRAMUSCULAR | Status: DC | PRN

## 2012-07-19 MED ORDER — GLIPIZIDE 10 MG PO TABS
10.0000 mg | ORAL_TABLET | Freq: Every day | ORAL | Status: DC
  Administered 2012-07-19 – 2012-07-21 (×3): 10 mg via ORAL
  Filled 2012-07-19 (×3): qty 1

## 2012-07-19 MED ORDER — BUSPIRONE HCL 15 MG PO TABS
15.0000 mg | ORAL_TABLET | Freq: Three times a day (TID) | ORAL | Status: DC
  Administered 2012-07-19 – 2012-07-21 (×6): 15 mg via ORAL
  Filled 2012-07-19 (×8): qty 1

## 2012-07-19 MED ORDER — ALLOPURINOL 300 MG PO TABS
300.0000 mg | ORAL_TABLET | Freq: Every day | ORAL | Status: DC
  Administered 2012-07-19 – 2012-07-21 (×3): 300 mg via ORAL
  Filled 2012-07-19 (×3): qty 1

## 2012-07-19 MED ORDER — SODIUM CHLORIDE 0.9 % IV SOLN
INTRAVENOUS | Status: DC
  Administered 2012-07-19 – 2012-07-20 (×2): via INTRAVENOUS

## 2012-07-19 NOTE — ED Provider Notes (Signed)
History     CSN: [id number]  Arrival date & time 07/19/12  4696   First MD Initiated Contact with Patient 07/19/12 [phone]      Chief Complaint  Patient presents with  . Chest Pain  . Pancreatitis    (Consider location/radiation/quality/duration/timing/severity/associated sxs/prior treatment) HPI.... epigastric pain with radiation to the back for 24 hours. Patient has history of pancreatitis.  She thinks she is having a recurrence of same.  Pain is sharp and stabbing. Severity is moderate. Slight nausea.  No true chest pain. Complains of nausea  Past Medical History  Diagnosis Date  . Asthma     past 10 years  . Arthritis     djd -lower back  . Diabetes mellitus     past 5 years  . Glaucoma(365)     dx 06/2011-[facility]  . Hypertension   . Hyperlipidemia   . Hyperthyroidism   . History of TIA (transient ischemic attack)     01/06/2011  . Pancreatitis   . Degenerative disc disease   . Gout   . Depression   . Renal disorder     chronic kidney disease, stage 3    Past Surgical History  Procedure Laterality Date  . Vaginal hysterectomy  1990    Family History  Problem Relation Age of Onset  . Arthritis Mother   . Heart disease      mother & father  . Hypertension      mother & father  . Colon cancer Neg Hx   . Diabetes Mother   . Prostate cancer Neg Hx   . Breast cancer Neg Hx     History  Substance Use Topics  . Smoking status: Current Every Day Smoker -- 1.00 packs/day for 30 years    Types: Cigarettes  . Smokeless tobacco: Never Used  . Alcohol Use: No    OB History   Grav Para Term Preterm Abortions TAB SAB Ect Mult Living                  Review of Systems  All other systems reviewed and are negative.    Allergies  Metformin and related and Vicodin  Home Medications   Current Outpatient Rx  Name  Route  Sig  Dispense  Refill  . allopurinol (ZYLOPRIM) 300 MG tablet   Oral   Take 300 mg by mouth daily.         Marland Kitchen aspirin  81 MG EC tablet   Oral   Take 1 tablet (81 mg total) by mouth daily. For platelet aggregation   30 tablet   0   . brimonidine (ALPHAGAN P) 0.1 % SOLN   Both Eyes   Place 1 drop into both eyes at bedtime. For allergic rhinitis         . busPIRone (BUSPAR) 15 MG tablet   Oral   Take 1 tablet (15 mg total) by mouth 3 (three) times daily. For anxiety   90 tablet   0   . colchicine 0.6 MG tablet   Oral   Take 0.6 mg by mouth daily as needed. For gout flare ups         . divalproex (DEPAKOTE ER) 500 MG 24 hr tablet   Oral   Take 2 tablets (1,000 mg total) by mouth at bedtime. For mood stabilization   60 tablet   0   . fluticasone (FLOVENT HFA) 44 MCG/ACT inhaler   Inhalation   Inhale 1 puff into the  lungs 2 (two) times daily.         Marland Kitchen gemfibrozil (LOPID) 600 MG tablet   Oral   Take 600 mg by mouth 2 (two) times daily before a meal.         . glipiZIDE (GLUCOTROL) 10 MG tablet   Oral   Take 10 mg by mouth daily.         Marland Kitchen latanoprost (XALATAN) 0.005 % ophthalmic solution   Both Eyes   Place 1 drop into both eyes at bedtime.         Marland Kitchen levothyroxine (SYNTHROID, LEVOTHROID) 200 MCG tablet   Oral   Take 1 tablet (200 mcg total) by mouth daily before breakfast. For hypothyroiduism   30 tablet   0   . lisinopril (PRINIVIL,ZESTRIL) 40 MG tablet   Oral   Take 1 tablet (40 mg total) by mouth daily. For high bp   30 tablet   0   . nicotine (NICODERM CQ - DOSED IN MG/24 HOURS) 14 mg/24hr patch   Transdermal   Place 1 patch onto the skin daily.         Marland Kitchen oxyCODONE-acetaminophen (PERCOCET) 7.5-325 MG per tablet   Oral   Take 1 tablet by mouth 3 (three) times daily. As needed for back pain         . pantoprazole (PROTONIX) 40 MG tablet   Oral   Take 40 mg by mouth daily.         . sertraline (ZOLOFT) 25 MG tablet   Oral   Take 50 mg by mouth daily.         . traZODone (DESYREL) 100 MG tablet   Oral   Take 1 tablet (100 mg total) by mouth at  bedtime. insomnia   30 tablet   0     BP 154/80  Pulse 78  Temp(Src) 98 F (36.7 C) (Oral)  Resp 20  SpO2 98%  Physical Exam  Nursing note and vitals reviewed. Constitutional: She is oriented to person, place, and time. She appears well-developed and well-nourished.  HENT:  Head: Normocephalic and atraumatic.  Eyes: Conjunctivae and EOM are normal. Pupils are equal, round, and reactive to light.  Neck: Normal range of motion. Neck supple.  Cardiovascular: Normal rate, regular rhythm and normal heart sounds.   Pulmonary/Chest: Effort normal and breath sounds normal.  Abdominal:  Tender in epigastrium  Musculoskeletal: Normal range of motion.  Neurological: She is alert and oriented to person, place, and time.  Skin: Skin is warm and dry.  Psychiatric: She has a normal mood and affect.    ED Course  Procedures (including critical care time)  Labs Reviewed  CBC - Abnormal; Notable for the following:    RDW 16.3 (*)    All other components within normal limits  COMPREHENSIVE METABOLIC PANEL - Abnormal; Notable for the following:    Glucose, Bld 270 (*)    Alkaline Phosphatase 122 (*)    GFR calc non Af Amer 56 (*)    GFR calc Af Amer 65 (*)    All other components within normal limits  LIPASE, BLOOD - Abnormal; Notable for the following:    Lipase 156 (*)    All other components within normal limits  POCT I-STAT TROPONIN I   No results found.   No diagnosis found.  Date: 07/19/2012  Rate: 75  Rhythm: normal sinus rhythm  QRS Axis: normal  Intervals: normal  ST/T Wave abnormalities: normal  Conduction Disutrbances:none  Narrative  Interpretation:   Old EKG Reviewed: changes noted  APC  MDM  Lipase is 156. Will admit for pain control and IV hydration        Donnetta Hutching, MD 07/19/12 1415

## 2012-07-19 NOTE — ED Notes (Signed)
Attempt x 1 to give report- RN unable. Request to be given 10 minutes.  Contact information given

## 2012-07-19 NOTE — ED Notes (Signed)
Pt c/o chest pain since yesterday.  Describes this pain as a stabbing pain in her sternum.  Pain 8/9.  States hx of pancreatitis and this pain is similar to the pain she has with her pancreatitis.  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

## 2012-07-19 NOTE — H&P (Signed)
[facility] History and Physical  [name] NFA:[id number] DOB: Feb 17, 1956 DOA: 07/19/2012  Referring physician: ER physician PCP: Domingo Pulse, MD   Chief Complaint: Abdominal pain  HPI:  Pt is 57 yo female with history of pancreatitis, HTN, diabetes, who presents to [facility] ED with main concern of generalized abdominal pain that started one to two days prior to admission and has been associated with nausea and non bloody vomiting, pain is constant and 10/10 in severity, sharp and radiating to bilateral back area. Pt explains that it resembles her typical pancreatitis episode. Pt reports poor oral intake but denies alcohol intake. Pt denies chest pain or shortness of breath, no urinary concerns. [facility] asked to admit for pancreatitis evaluation and management.   Principal Problem:   Acute pancreatitis - will admit the pt to medical floor for further evaluation - provide supportive care with IVF, analgesia and antiemetics as needed - check alcohol level and lipid panel - repeat lipase in AM Active Problems:   Hypothyroidism - will check TSH - continue synthroid   DM (diabetes mellitus) - will check A1C - will continue SSI, novolog   Chronic back pain - continue analgesia as needed for adequate symptom control    HTN (hypertension), accelerated - will continue home medication regimen for now - monitor vitals per floor protocol and readjust the regimen as indicated    Schizophrenia - appears clinically stable    Gout - stable - continue Allopurinol   Peripheral neuropathy - symptomatic management    Depression with anxiety  Review of Systems:   Constitutional: Negative for fever, chills and malaise/fatigue. Negative for diaphoresis.  HENT: Negative for hearing loss, ear pain, nosebleeds, congestion, sore throat, neck pain, tinnitus and ear discharge.   Eyes: Negative for blurred vision, double vision, photophobia, pain, discharge and redness.  Respiratory: Negative for  cough, hemoptysis, sputum production, shortness of breath, wheezing and stridor.   Cardiovascular: Negative for chest pain, palpitations, orthopnea, claudication and leg swelling.  Gastrointestinal: Positive for nausea, vomiting and abdominal pain. Negative for heartburn, constipation, blood in stool and melena.  Genitourinary: Negative for dysuria, urgency, frequency, hematuria and flank pain.  Musculoskeletal: Negative for myalgias, back pain, joint pain and falls.  Skin: Negative for itching and rash.  Neurological: Negative for dizziness and weakness. Negative for tingling, tremors, sensory change, speech change, focal weakness, loss of consciousness and headaches.  Endo/Heme/Allergies: Negative for environmental allergies and polydipsia. Does not bruise/bleed easily.  Psychiatric/Behavioral: Negative for suicidal ideas. The patient is not nervous/anxious.      Past Medical History  Diagnosis Date  . Asthma     past 10 years  . Arthritis     djd -lower back  . Diabetes mellitus     past 5 years  . Glaucoma(365)     dx 06/2011-[facility]  . Hypertension   . Hyperlipidemia   . Hyperthyroidism   . History of TIA (transient ischemic attack)     01/06/2011  . Pancreatitis   . Degenerative disc disease   . Gout   . Depression   . Renal disorder     chronic kidney disease, stage 3   Past Surgical History  Procedure Laterality Date  . Vaginal hysterectomy  1990   Social History:  reports that she has been smoking Cigarettes.  She has a 30 pack-year smoking history. She has never used smokeless tobacco. She reports that she uses illicit drugs ("Crack" cocaine). She reports that she does not drink alcohol.  Allergies  Allergen Reactions  . Metformin And Related Diarrhea  . Vicodin (Hydrocodone-Acetaminophen) Hives    Family History: no history of cancers, no cardiovascular diseases on mother or father side  Prior to Admission medications   Medication Sig Start Date  End Date Taking? Authorizing Provider  allopurinol (ZYLOPRIM) 300 MG tablet Take 300 mg by mouth daily.   Yes Historical Provider, MD  aspirin 81 MG EC tablet Take 1 tablet (81 mg total) by mouth daily. For platelet aggregation 03/12/12  Yes Himabindu Ravi, MD  brimonidine (ALPHAGAN P) 0.1 % SOLN Place 1 drop into both eyes at bedtime. For allergic rhinitis 03/12/12  Yes Himabindu Ravi, MD  busPIRone (BUSPAR) 15 MG tablet Take 1 tablet (15 mg total) by mouth 3 (three) times daily. For anxiety 03/12/12  Yes Himabindu Ravi, MD  colchicine 0.6 MG tablet Take 0.6 mg by mouth daily as needed. For gout flare ups   Yes Historical Provider, MD  divalproex (DEPAKOTE ER) 500 MG 24 hr tablet Take 2 tablets (1,000 mg total) by mouth at bedtime. For mood stabilization 03/12/12  Yes Himabindu Ravi, MD  fluticasone (FLOVENT HFA) 44 MCG/ACT inhaler Inhale 1 puff into the lungs 2 (two) times daily.   Yes Historical Provider, MD  gemfibrozil (LOPID) 600 MG tablet Take 600 mg by mouth 2 (two) times daily before a meal.   Yes Historical Provider, MD  glipiZIDE (GLUCOTROL) 10 MG tablet Take 10 mg by mouth daily.   Yes Historical Provider, MD  latanoprost (XALATAN) 0.005 % ophthalmic solution Place 1 drop into both eyes at bedtime.   Yes Historical Provider, MD  levothyroxine (SYNTHROID, LEVOTHROID) 200 MCG tablet Take 1 tablet (200 mcg total) by mouth daily before breakfast. For hypothyroiduism 03/12/12  Yes Himabindu Ravi, MD  lisinopril (PRINIVIL,ZESTRIL) 40 MG tablet Take 1 tablet (40 mg total) by mouth daily. For high bp 03/12/12  Yes Himabindu Ravi, MD  nicotine (NICODERM CQ - DOSED IN MG/24 HOURS) 14 mg/24hr patch Place 1 patch onto the skin daily.   Yes Historical Provider, MD  oxyCODONE-acetaminophen (PERCOCET) 7.5-325 MG per tablet Take 1 tablet by mouth 3 (three) times daily. As needed for back pain   Yes Historical Provider, MD  pantoprazole (PROTONIX) 40 MG tablet Take 40 mg by mouth daily.   Yes Historical  Provider, MD  sertraline (ZOLOFT) 25 MG tablet Take 50 mg by mouth daily. 03/12/12  Yes Himabindu Ravi, MD  traZODone (DESYREL) 100 MG tablet Take 1 tablet (100 mg total) by mouth at bedtime. insomnia 03/12/12  Yes Patrick North, MD   Physical Exam: Filed Vitals:   07/19/12 1226 07/19/12 1230 07/19/12 1300 07/19/12 1542  BP: 183/94 147/86 159/80 161/85  Pulse:  53 53 74  Temp: 97.5 F (36.4 C)   97.4 F (36.3 C)  TempSrc: Oral   Oral  Resp: 17 21 17 16   SpO2: 94%  95% 97%    Physical Exam  Constitutional: Appears well-developed and well-nourished. No distress.  HENT: Normocephalic. External right and left ear normal. Oropharynx is clear and moist.  Eyes: Conjunctivae and EOM are normal. PERRLA, no scleral icterus.  Neck: Normal ROM. Neck supple. No JVD. No tracheal deviation. No thyromegaly.  CVS: RRR, S1/S2 +, no murmurs, no gallops, no carotid bruit.  Pulmonary: Effort and breath sounds normal, no stridor, rhonchi, wheezes, rales.  Abdominal: Soft. BS +,  no distension, tenderness in epigastric area, no rebound or guarding.  Musculoskeletal: Normal range of motion. No edema and no tenderness.  Lymphadenopathy:  No lymphadenopathy noted, cervical, inguinal. Neuro: Alert. Normal reflexes, muscle tone coordination. No cranial nerve deficit. Skin: Skin is warm and dry. No rash noted. Not diaphoretic. No erythema. No pallor.  Psychiatric: Normal mood and affect. Behavior, judgment, thought content normal.   Labs on Admission:  Basic Metabolic Panel:  Recent Labs Lab 07/19/12 1153  NA 135  K 3.7  CL 98  CO2 26  GLUCOSE 270*  BUN 11  CREATININE 1.10  CALCIUM 8.9   Liver Function Tests:  Recent Labs Lab 07/19/12 1153  AST 20  ALT 16  ALKPHOS 122*  BILITOT 0.3  PROT 7.4  ALBUMIN 3.8    Recent Labs Lab 07/19/12 1153  LIPASE 156*   CBC:  Recent Labs Lab 07/19/12 1035  WBC 6.2  HGB 13.3  HCT 39.7  MCV 82.2  PLT 396    Radiological Exams on  Admission: No results found.  EKG: Normal sinus rhythm, no ST/T wave changes  Code Status: Full Family Communication: Pt at bedside Disposition Plan: Admit for further evaluation  Manson Passey, MD  [facility] Pager [phone]  If 7PM-7AM, please contact night-coverage [email or web address] Password [facility] 07/19/2012, 4:28 PM

## 2012-07-19 NOTE — ED Notes (Signed)
Pt alert and oriented x4. Respirations even and unlabored, bilateral symmetrical rise and fall of chest. Skin warm and dry. In no acute distress. Denies needs.   

## 2012-07-20 LAB — CBC
Hemoglobin: 12.3 g/dL (ref 12.0–15.0)
Platelets: 258 10*3/uL (ref 150–400)
RBC: 4.34 MIL/uL (ref 3.87–5.11)
WBC: 6.3 10*3/uL (ref 4.0–10.5)

## 2012-07-20 LAB — COMPREHENSIVE METABOLIC PANEL
AST: 23 U/L (ref 0–37)
BUN: 11 mg/dL (ref 6–23)
CO2: 20 mEq/L (ref 19–32)
Calcium: 8.6 mg/dL (ref 8.4–10.5)
Chloride: 100 mEq/L (ref 96–112)
Creatinine, Ser: 1.16 mg/dL — ABNORMAL HIGH (ref 0.50–1.10)
GFR calc Af Amer: 61 mL/min — ABNORMAL LOW (ref >90)
GFR calc non Af Amer: 52 mL/min — ABNORMAL LOW (ref >90)
Total Bilirubin: 0.3 mg/dL (ref 0.3–1.2)

## 2012-07-20 LAB — GLUCOSE, CAPILLARY
Glucose-Capillary: 170 mg/dL — ABNORMAL HIGH (ref 70–99)
Glucose-Capillary: 172 mg/dL — ABNORMAL HIGH (ref 70–99)
Glucose-Capillary: 172 mg/dL — ABNORMAL HIGH (ref 70–99)
Glucose-Capillary: 178 mg/dL — ABNORMAL HIGH (ref 70–99)
Glucose-Capillary: 281 mg/dL — ABNORMAL HIGH (ref 70–99)

## 2012-07-20 LAB — LIPASE, BLOOD: Lipase: 36 U/L (ref 11–59)

## 2012-07-20 MED ORDER — FENTANYL CITRATE 0.05 MG/ML IJ SOLN
25.0000 ug | INTRAMUSCULAR | Status: DC | PRN
  Administered 2012-07-20: 25 ug via INTRAVENOUS
  Filled 2012-07-20: qty 2

## 2012-07-20 MED ORDER — ENSURE COMPLETE PO LIQD
237.0000 mL | Freq: Two times a day (BID) | ORAL | Status: DC
  Administered 2012-07-20 – 2012-07-21 (×3): 237 mL via ORAL

## 2012-07-20 MED ORDER — SODIUM CHLORIDE 0.9 % IV BOLUS (SEPSIS)
500.0000 mL | Freq: Once | INTRAVENOUS | Status: AC
  Administered 2012-07-20: 500 mL via INTRAVENOUS

## 2012-07-20 MED ORDER — HYDROMORPHONE HCL PF 2 MG/ML IJ SOLN
2.0000 mg | INTRAMUSCULAR | Status: DC | PRN
  Administered 2012-07-20 – 2012-07-21 (×7): 2 mg via INTRAVENOUS
  Filled 2012-07-20 (×7): qty 1

## 2012-07-20 NOTE — Plan of Care (Addendum)
Problem: Food- and Nutrition-Related Knowledge Deficit (NB-1.1) Goal: Nutrition education Formal process to instruct or train a patient/client in a skill or to impart knowledge to help patients/clients voluntarily manage or modify food choices and eating behavior to maintain or improve health. Outcome: Completed/Met Date Met:  07/20/12   Patient identified on the Malnutrition Screening Tool (MST) Report  Body mass index is 38.94 kg/(m^2). Patient meets criteria for obesity grade  based on current BMI.   Patient reports 3# weight loss since October of 2013.  Has not been eating well for the past week but prior to that had good intake.  Patient requested information on diet for pancreatis.  Provided patient with written information and discussed.  Teach back method used.  Discussed importance of limiting fat intake.  Patient was able to verbalize.  Patient denies ETOH use.  Current diet order is CHO MOD, patient is consuming approximately 10% of meals at this time. Labs and medications reviewed.   Will add Ensure Complete bid per patient request.  Please consult if further nutritional needs arise.  Oran Rein, RD, LDN Clinical Inpatient Dietitian Pager:  [phone] Weekend and after hours pager:  [phone]

## 2012-07-20 NOTE — Progress Notes (Signed)
Pt experienced fall at bedside at 2355 on 07/19/12.  Pt requested assistance to the bathroom.  Nurse tech turned off bed alarm and assisted patient to the side of the bed to dangle for a few minutes before using the bedside commode.  Pt wearing red socks and yellow armband.  Pt known high fall risk, and RN and NT had discussed pt's fall risk score and interventions previously, however pt had been a x1 assist in the past without problem.  After dangling for a few minutes the tech walked around the bed to unplug the IV pole and asked the patient to wait until she returned.  While the tech was on the other side of the bed the patient pushed herself forward and slid off the bed to the floor.  Two RNs and one NT assisted patient back to bed.  Pt had no complaints, no new pain, no visible injuries, and no longer needed to use the rest room.  VS and CBG were taken, MD called, AC called, bedside huddle completed.  At 2216 pt's BP was 140/73 and pulse of 68, which was normal for this patient.  At 2247 RN administered 50 mcg Fentanyl IV per MD order. At 2355 fall occurred. Post fall VS at 0005 showed BP of 81/48 and pulse of 51. MD notified of these results and a 500 ml bolus and NS was ordered and administered, as well as the patients PRN pain medication decreased to 25-50 mcg Fentanyl IV.  Vital signs at 0135 after bolus was BP of 132/82, pulse 61.   Will continue to assess patient and will use two staff members to assist patient with toileting and ambulating for duration of shift.    [name] 07/20/2012

## 2012-07-20 NOTE — Progress Notes (Signed)
UR completed 

## 2012-07-20 NOTE — Progress Notes (Signed)
[facility] PROGRESS NOTE  [name] ZOX:[id number] DOB: 1955-08-08 DOA: 07/19/2012 PCP: Domingo Pulse, MD  Brief narrative: Pt is 57 yo female with history of pancreatitis, HTN, diabetes, who presents to [facility] ED with main concern of generalized abdominal pain that started one to two days prior to admission and has been associated with nausea and non bloody vomiting. Patient had elevated lipase level on admission at 156.  Assessment and Plan:  Principal Problem:  Acute pancreatitis   Continue IV fluids  Advance diet as tolerated  We will d/c fentanyl and start dilaudid 2 mg Q 3 hours PRN iV for better pain control  Lipase level now WNL Active Problems:  Hypothyroidism   Continue synthroidd  DM (diabetes mellitus)   Continue sliding scale insulin  Follow up A1c Chronic back pain   continue analgesia as needed for adequate symptom control  HTN (hypertension), accelerated   will continue home medication regimen for now   monitor vitals per floor protocol and readjust the regimen as indicated  Schizophrenia   appears clinically stable  Gout   stable   continue Allopurinol  Peripheral neuropathy   symptomatic management  Depression with anxiety  continue home medications  Code Status: full code Family Communication: at bedside Disposition Plan: home in am  Manson Passey, MD  [facility] Pager [phone]  If 7PM-7AM, please contact night-coverage [email or web address] Password [email or web address] 07/20/2012, 10:13 AM   LOS: 1 day   Consultants:  None   Procedures:  None   Antibiotics:  None   HPI/Subjective: No acute overnight events.  Objective: Filed Vitals:   07/19/12 2216 07/20/12 0005 07/20/12 0135 07/20/12 0508  BP: 140/73 81/48 132/82 116/68  Pulse: 68 51 61 68  Temp: 98.4 F (36.9 C) 98.2 F (36.8 C)  98.3 F (36.8 C)  TempSrc: Oral Oral  Oral  Resp: 16 22  20   Height:      Weight:    96.6 kg (212 lb 15.4 oz)  SpO2: 96% 95%  98%    Intake/Output  Summary (Last 24 hours) at 07/20/12 1013 Last data filed at 07/19/12 1700  Gross per 24 hour  Intake   1000 ml  Output    200 ml  Net    800 ml    Exam:   General:  Pt is alert, follows commands appropriately, not in acute distress  Cardiovascular: Regular rate and rhythm, S1/S2, no murmurs, no rubs, no gallops  Respiratory: Clear to auscultation bilaterally, no wheezing, no crackles, no rhonchi  Abdomen: Soft, non tender, non distended, bowel sounds present, no guarding  Extremities: No edema, pulses DP and PT palpable bilaterally  Neuro: Grossly nonfocal  Data Reviewed: Basic Metabolic Panel:  Recent Labs Lab 07/19/12 1153 07/19/12 1636 07/20/12 0423  NA 135  --  136  K 3.7  --  4.0  CL 98  --  100  CO2 26  --  20  GLUCOSE 270*  --  219*  BUN 11  --  11  CREATININE 1.10  --  1.16*  CALCIUM 8.9  --  8.6  MG  --  1.7  --   PHOS  --  2.9  --    Liver Function Tests:  Recent Labs Lab 07/19/12 1153 07/20/12 0423  AST 20 23  ALT 16 12  ALKPHOS 122* 110  BILITOT 0.3 0.3  PROT 7.4 6.5  ALBUMIN 3.8 3.3*    Recent Labs Lab 07/19/12 1153 07/20/12 0423  LIPASE 156* 36   No results  found for this basename: AMMONIA,  in the last 168 hours CBC:  Recent Labs Lab 07/19/12 1035 07/20/12 0423  WBC 6.2 6.3  HGB 13.3 12.3  HCT 39.7 37.0  MCV 82.2 85.3  PLT 396 258   Cardiac Enzymes: No results found for this basename: CKTOTAL, CKMB, CKMBINDEX, TROPONINI,  in the last 168 hours BNP: No components found with this basename: POCBNP,  CBG:  Recent Labs Lab 07/19/12 1700 07/19/12 2144 07/20/12 0012 07/20/12 0754  GLUCAP 152* 280* 281* 172*    No results found for this or any previous visit (from the past 240 hour(s)).   Studies: No results found.  Scheduled Meds: . allopurinol  300 mg Oral Daily  . aspirin  81 mg Oral Daily  . brimonidine  1 drop Both Eyes QHS  . busPIRone  15 mg Oral TID  . divalproex  1,000 mg Oral QHS  . fluticasone  1  puff Inhalation BID  . gemfibrozil  600 mg Oral BID AC  . glipiZIDE  10 mg Oral Daily  . insulin aspart  0-15 Units Subcutaneous TID WC  . insulin aspart  0-5 Units Subcutaneous QHS  . latanoprost  1 drop Both Eyes QHS  . levothyroxine  200 mcg Oral QAC breakfast  . lisinopril  40 mg Oral Daily  . nicotine  14 mg Transdermal Q24H  . pantoprazole (PROTONIX) IV  40 mg Intravenous Q24H  . sertraline  50 mg Oral Daily  . traZODone  100 mg Oral QHS   Continuous Infusions: . sodium chloride 100 mL/hr at 07/19/12 1604

## 2012-07-21 DIAGNOSIS — M109 Gout, unspecified: Secondary | ICD-10-CM

## 2012-07-21 LAB — GLUCOSE, CAPILLARY: Glucose-Capillary: 142 mg/dL — ABNORMAL HIGH (ref 70–99)

## 2012-07-21 MED ORDER — BUSPIRONE HCL 15 MG PO TABS: 15.0000 mg | ORAL_TABLET | Freq: Three times a day (TID) | ORAL | Status: DC

## 2012-07-21 MED ORDER — ASPIRIN 81 MG PO TBEC: 81.0000 mg | DELAYED_RELEASE_TABLET | Freq: Every day | ORAL | Status: DC

## 2012-07-21 MED ORDER — HYDROMORPHONE HCL 4 MG PO TABS: 4.0000 mg | ORAL_TABLET | ORAL | Status: DC | PRN

## 2012-07-21 MED ORDER — ONDANSETRON HCL 4 MG PO TABS: 4.0000 mg | ORAL_TABLET | Freq: Four times a day (QID) | ORAL | Status: DC | PRN

## 2012-07-21 MED ORDER — DIVALPROEX SODIUM ER 500 MG PO TB24: 1000.0000 mg | ORAL_TABLET | Freq: Every day | ORAL | Status: DC

## 2012-07-21 MED ORDER — OXYCODONE-ACETAMINOPHEN 7.5-325 MG PO TABS: 1.0000 | ORAL_TABLET | Freq: Three times a day (TID) | ORAL | Status: DC

## 2012-07-21 MED ORDER — LISINOPRIL 40 MG PO TABS: 40.0000 mg | ORAL_TABLET | Freq: Every day | ORAL | Status: DC

## 2012-07-21 MED ORDER — ALLOPURINOL 300 MG PO TABS: 300.0000 mg | ORAL_TABLET | Freq: Every day | ORAL | Status: DC

## 2012-07-21 MED ORDER — LEVOTHYROXINE SODIUM 200 MCG PO TABS: 200.0000 ug | ORAL_TABLET | Freq: Every day | ORAL | Status: DC

## 2012-07-21 MED ORDER — GEMFIBROZIL 600 MG PO TABS: 600.0000 mg | ORAL_TABLET | Freq: Two times a day (BID) | ORAL | Status: DC

## 2012-07-21 MED ORDER — GLIPIZIDE 10 MG PO TABS: 10.0000 mg | ORAL_TABLET | Freq: Every day | ORAL | Status: DC

## 2012-07-21 MED ORDER — SERTRALINE HCL 25 MG PO TABS: 50.0000 mg | ORAL_TABLET | Freq: Every day | ORAL | Status: DC

## 2012-07-21 MED ORDER — TRAZODONE HCL 100 MG PO TABS: 100.0000 mg | ORAL_TABLET | Freq: Every day | ORAL | Status: DC

## 2012-07-21 MED ORDER — PANTOPRAZOLE SODIUM 40 MG PO TBEC: 40.0000 mg | DELAYED_RELEASE_TABLET | Freq: Every day | ORAL | Status: DC

## 2012-07-21 NOTE — Progress Notes (Signed)
Pt discharged home in with family in stable condition. Discharge instructions and scripts given. Pt verbalized understanding

## 2012-07-21 NOTE — Discharge Summary (Signed)
Physician Discharge Summary  [name]:[id number] DOB: 04-09-1956 DOA: 07/19/2012  PCP: Domingo Pulse, MD  Admit date: 07/19/2012 Discharge date: 07/21/2012  Recommendations for Outpatient Follow-up:  1. Follow up with your PCP in 2 weeks or sooner if symptoms persist  Discharge Diagnoses:  Principal Problem:   Acute pancreatitis Active Problems:   Hypothyroidism   DM (diabetes mellitus)   Chronic back pain   HTN (hypertension)   Schizophrenia   Gout   Peripheral neuropathy   Depression with anxiety  Discharge Condition: medically stable for discharge home today  Diet recommendation: Diabetic diet  History of present illness:  [name] is 57 yo female with history of pancreatitis, HTN, diabetes, who presents to [facility] ED with main concern of generalized abdominal pain that started one to two days prior to admission and has been associated with nausea and non bloody vomiting. Patient had elevated lipase level on admission at 156.   Assessment and Plan:  Principal Problem:  Acute pancreatitis  Resolved. Continue Dilaudid as needed, we'll transition to by mouth. Diet as tolerated Lipase level now WNL at 36. Active Problems:  Hypothyroidism  Continue synthroidd  DM (diabetes mellitus), uncontrolled with complication Appreciate diabetic coordinator consult. A1c on this admission 12.7 indicated poor glucose control. Continue home meds. Chronic back pain  Will give prescription for Percocet and Dilaudid as needed for better pain control and home HTN (hypertension), accelerated  Continue home antihypertensive medication Schizophrenia  appears clinically stable  Gout  stable  continue Allopurinol  Peripheral neuropathy  symptomatic management  Depression with anxiety  continue home medications   Code Status: full code  Family Communication: at bedside  Disposition Plan: home today  Manson Passey, MD  [facility]  Pager [phone]   Consultants:  None  Procedures:  None   Antibiotics:  None     Discharge Exam: Filed Vitals:   07/21/12 0536  BP: 121/64  Pulse: 74  Temp: 98.3 F (36.8 C)  Resp: 18   Filed Vitals:   07/20/12 2143 07/21/12 0149 07/21/12 0536 07/21/12 0734  BP: 110/83 101/63 121/64   Pulse: 74 73 74   Temp: 97.5 F (36.4 C) 98.2 F (36.8 C) 98.3 F (36.8 C)   TempSrc: Oral Oral Oral   Resp: 18 18 18    Height:      Weight:      SpO2: 95% 95% 98% 93%    General: [name] is alert, follows commands appropriately, not in acute distress Cardiovascular: Regular rate and rhythm, S1/S2 +, no murmurs, no rubs, no gallops Respiratory: Clear to auscultation bilaterally, no wheezing, no crackles, no rhonchi Abdominal: Soft, non tender, non distended, bowel sounds +, no guarding Extremities: no edema, no cyanosis, pulses palpable bilaterally DP and [name] Neuro: Grossly nonfocal  Discharge Instructions  Discharge Orders   Future Orders Complete By Expires     Call MD for:  difficulty breathing, headache or visual disturbances  As directed     Call MD for:  persistant dizziness or light-headedness  As directed     Call MD for:  persistant nausea and vomiting  As directed     Call MD for:  severe uncontrolled pain  As directed     Diet - low sodium heart healthy  As directed     Increase activity slowly  As directed         Medication List    TAKE these medications       allopurinol 300 MG tablet  Commonly known as:  ZYLOPRIM  Take 1 tablet (300 mg total) by mouth daily.     aspirin 81 MG EC tablet  Take 1 tablet (81 mg total) by mouth daily. For platelet aggregation     brimonidine 0.1 % Soln  Commonly known as:  ALPHAGAN P  Place 1 drop into both eyes at bedtime. For allergic rhinitis     busPIRone 15 MG tablet  Commonly known as:  BUSPAR  Take 1 tablet (15 mg total) by mouth 3 (three) times daily. For anxiety     colchicine 0.6 MG tablet  Take 0.6 mg by mouth daily as needed. For gout flare ups     divalproex 500 MG 24 hr  tablet  Commonly known as:  DEPAKOTE ER  Take 2 tablets (1,000 mg total) by mouth at bedtime. For mood stabilization     fluticasone 44 MCG/ACT inhaler  Commonly known as:  FLOVENT HFA  Inhale 1 puff into the lungs 2 (two) times daily.     gemfibrozil 600 MG tablet  Commonly known as:  LOPID  Take 1 tablet (600 mg total) by mouth 2 (two) times daily before a meal.     glipiZIDE 10 MG tablet  Commonly known as:  GLUCOTROL  Take 1 tablet (10 mg total) by mouth daily.     HYDROmorphone 4 MG tablet  Commonly known as:  DILAUDID  Take 1 tablet (4 mg total) by mouth every 4 (four) hours as needed for pain.     latanoprost 0.005 % ophthalmic solution  Commonly known as:  XALATAN  Place 1 drop into both eyes at bedtime.     levothyroxine 200 MCG tablet  Commonly known as:  SYNTHROID, LEVOTHROID  Take 1 tablet (200 mcg total) by mouth daily before breakfast. For hypothyroiduism     lisinopril 40 MG tablet  Commonly known as:  PRINIVIL,ZESTRIL  Take 1 tablet (40 mg total) by mouth daily. For high bp     nicotine 14 mg/24hr patch  Commonly known as:  NICODERM CQ - dosed in mg/24 hours  Place 1 patch onto the skin daily.     ondansetron 4 MG tablet  Commonly known as:  ZOFRAN  Take 1 tablet (4 mg total) by mouth every 6 (six) hours as needed for nausea.     oxyCODONE-acetaminophen 7.5-325 MG per tablet  Commonly known as:  PERCOCET  Take 1 tablet by mouth 3 (three) times daily. As needed for back pain     pantoprazole 40 MG tablet  Commonly known as:  PROTONIX  Take 1 tablet (40 mg total) by mouth daily.     sertraline 25 MG tablet  Commonly known as:  ZOLOFT  Take 2 tablets (50 mg total) by mouth daily.     traZODone 100 MG tablet  Commonly known as:  DESYREL  Take 1 tablet (100 mg total) by mouth at bedtime. insomnia           Follow-up Information   Follow up with VERNON,CHERYL, MD In 2 weeks.   Contact information:   [address] [place]  [phone]        The results of significant diagnostics from this hospitalization (including imaging, microbiology, ancillary and laboratory) are listed below for reference.    Significant Diagnostic Studies: Dg Chest 2 View  07/03/2012  *RADIOLOGY REPORT*  Clinical Data: Shortness of breath.  CHEST - 2 VIEW  Comparison: 08/30/2010.  Findings: The cardiac silhouette, mediastinal and hilar contours are normal and  stable.  The lungs are clear.  No pleural effusion. The bony thorax is intact.  IMPRESSION: No acute cardiopulmonary findings.   Original Report Authenticated By: Rudie Meyer, M.D.     Microbiology: No results found for this or any previous visit (from the past 240 hour(s)).   Labs: Basic Metabolic Panel:  Recent Labs Lab 07/19/12 1153 07/19/12 1636 07/20/12 0423  NA 135  --  136  K 3.7  --  4.0  CL 98  --  100  CO2 26  --  20  GLUCOSE 270*  --  219*  BUN 11  --  11  CREATININE 1.10  --  1.16*  CALCIUM 8.9  --  8.6  MG  --  1.7  --   PHOS  --  2.9  --    Liver Function Tests:  Recent Labs Lab 07/19/12 1153 07/20/12 0423  AST 20 23  ALT 16 12  ALKPHOS 122* 110  BILITOT 0.3 0.3  PROT 7.4 6.5  ALBUMIN 3.8 3.3*    Recent Labs Lab 07/19/12 1153 07/20/12 0423  LIPASE 156* 36   No results found for this basename: AMMONIA,  in the last 168 hours CBC:  Recent Labs Lab 07/19/12 1035 07/20/12 0423  WBC 6.2 6.3  HGB 13.3 12.3  HCT 39.7 37.0  MCV 82.2 85.3  PLT 396 258   Cardiac Enzymes: No results found for this basename: CKTOTAL, CKMB, CKMBINDEX, TROPONINI,  in the last 168 hours BNP: BNP (last 3 results) No results found for this basename: PROBNP,  in the last 8760 hours CBG:  Recent Labs Lab 07/20/12 0754 07/20/12 1127 07/20/12 1744 07/20/12 2127 07/21/12 0752  GLUCAP 172* 172* 170* 178* 142*    Time coordinating discharge: Over 30 minutes  Signed:  Manson Passey, MD  [facility]  07/21/2012, 10:36 AM  Pager #:  [phone]

## 2012-07-21 NOTE — Evaluation (Signed)
Physical Therapy Evaluation Patient Details Name: [name] MRN: [id number] DOB: 02-Jun-1955 Today's Date: 07/21/2012 Time: 1106-1140 PT Time Calculation (min): 34 min  PT Assessment / Plan / Recommendation Clinical Impression  Pt is limted in amublation by ?? She is tearful and appears fearful. She is unable to take steps and advance walking in order to go home Husband appears supportive, but pt has steps to enter home. Pt appears to have the physical strength and ROM to progress with gait. Will see patient again this pm to do bed exercises and try to walk again    PT Assessment  Patient needs continued PT services    Follow Up Recommendations  Home health PT    Does the patient have the potential to tolerate intense rehabilitation      Barriers to Discharge Inaccessible home environment 2 sets of steps to enter home    Equipment Recommendations  Rolling walker with 5" wheels    Recommendations for Other Services     Frequency Min 3X/week    Precautions / Restrictions Precautions Precautions: Fall   Pertinent Vitals/Pain Post ambulation BP: 128/64, HR: 71, O2 sat 93% on RA      Mobility  Bed Mobility Bed Mobility: Supine to Sit Supine to Sit: 7: Independent Transfers Transfers: Sit to Stand;Stand to Sit Sit to Stand: 5: Supervision Stand to Sit: 5: Supervision Details for Transfer Assistance: pt needs constant encouragement.  Ambulation/Gait Ambulation/Gait Assistance: 4: Min assist Ambulation Distance (Feet): 18 Feet (6, 6, 6 rest breaks in between, excessive time) Assistive device: Rolling walker Ambulation/Gait Assistance Details: assist for safety, had to have chair pulled up for her to sit suddenly General Gait Details: pt is unable to progress. She is tearful, anxious.  She talks to herself to "pump up" to walk but is still unable to progress. She has to have chair pulled up under her on 3 occasions.  On one occasion, she c/o that she needed to vomit, pt  crying. She needed to have husband by her side Pt takes excessive time Stairs: No Wheelchair Mobility Wheelchair Mobility: No    Exercises     PT Diagnosis: Difficulty walking  PT Problem List: Decreased activity tolerance;Other (comment) (anxiety) PT Treatment Interventions: DME instruction;Gait training;Wheelchair mobility training;Therapeutic exercise;Patient/family education   PT Goals Acute Rehab PT Goals PT Goal Formulation: With patient/family Time For Goal Achievement: 08/04/12 Potential to Achieve Goals: Good Pt will go Supine/Side to Sit: Independently PT Goal: Supine/Side to Sit - Progress: Goal set today Pt will go Sit to Supine/Side: Independently PT Goal: Sit to Supine/Side - Progress: Goal set today Pt will go Sit to Stand: Independently PT Goal: Sit to Stand - Progress: Goal set today Pt will go Stand to Sit: Independently PT Goal: Stand to Sit - Progress: Goal set today Pt will Ambulate: >150 feet;with modified independence;with least restrictive assistive device PT Goal: Ambulate - Progress: Goal set today Pt will Go Up / Down Stairs: 3-5 stairs;with min assist;with least restrictive assistive device PT Goal: Up/Down Stairs - Progress: Goal set today Pt will Perform Home Exercise Program: with supervision, verbal cues required/provided PT Goal: Perform Home Exercise Program - Progress: Goal set today  Visit Information  Last PT Received On: 07/21/12 Assistance Needed: +1    Subjective Data  Subjective: I want to go home so bad!  pt tearful Patient Stated Goal: to go home with husband   Prior Functioning  Home Living Lives With: Spouse Available Help at Discharge: Family Type of  Home: House Home Access: Stairs to enter [employer] of Steps: 4, 5 steps with a landing in between Entrance Stairs-Rails: Right;Left Home Layout: One level Home Adaptive Equipment: None Prior Function Level of Independence: Independent Able to Take Stairs?:  Yes Communication Communication: No difficulties    Cognition  Cognition Overall Cognitive Status: Impaired Area of Impairment: Other (comment) (pt emotionally labile, difficulty with movent) Arousal/Alertness: Awake/alert Orientation Level: Appears intact for tasks assessed Cognition - Other Comments: looks to husband for support    Extremity/Trunk Assessment Right Lower Extremity Assessment RLE ROM/Strength/Tone: Within functional levels Left Lower Extremity Assessment LLE ROM/Strength/Tone: Within functional levels Trunk Assessment Trunk Assessment: Other exceptions Trunk Exceptions: obese   Balance    End of Session PT - End of Session Equipment Utilized During Treatment: Gait belt Activity Tolerance: Other (comment) (limited by anxiety) Patient left: in chair;with family/visitor present Nurse Communication: Mobility status  GP    Rosey Bath K. [place], [place] [phone] 07/21/2012, 1:13 PM

## 2012-07-21 NOTE — Progress Notes (Signed)
Inpatient Diabetes Program Recommendations  AACE/ADA: New Consensus Statement on Inpatient Glycemic Control (2013)  Target Ranges:  Prepandial:   less than 140 mg/dL      Peak postprandial:   less than 180 mg/dL (1-2 hours)      Critically ill patients:  140 - 180 mg/dL   Reason for Visit: Diabetes Consult - Uncontrolled DM  Pt familiar from previous hospitalization in Oct 2013 at [facility].  Long discussion in October about importance of controlling blood sugars at home.  Pt stated she would obtain meter and f/u with PCP.  Did not get meter and states she has had polyuria, polydipsia and blurred vision at home.  Has seen PCP in [place] in last 3 mon.  Again, reiterated need for managing diabetes and instructed again to purchase meter at [facility] for approx $14 and strips in order to take to MD to make adjustments.  Pt verbalized understanding.  Encouraged to view diabetes videos on pt ed channel.  Will likely need insulin in the near future.  Pt has Living Well With Diabetes book at home.  Results for RIPLEY, LOVECCHIO (MRN [id number]) as of 07/21/2012 12:18  Ref. Range 07/20/2012 11:27 07/20/2012 17:44 07/20/2012 21:27 07/21/2012 07:52 07/21/2012 11:48  Glucose-Capillary Latest Range: 70-99 mg/dL 086 (H) 578 (H) 469 (H) 142 (H) 158 (H)  Results for JAKIA, KENNEBREW (MRN [id number]) as of 07/21/2012 12:18  Ref. Range 03/04/2012 06:21 07/19/2012 10:30  Hemoglobin A1C Latest Range: <5.7 % 7.9 (H) 12.7 (H)     Inpatient Diabetes Program Recommendations Oral Agents: Consider tradjenta 5 mg in addition to glipizide. Outpatient Referral: OP Diabetes Education for HgbA1C of 12.7%  Note:  HgbA1C indicates poor glycemic control at home.  Pt states she was on Lantus at one time, but it was discontinued several years ago.  May consider DPP-IV inhibitor such as tradjenta.  Pt cannot go home on insulin without blood glucose meter.  May try this in combination with glipizide.  Again, I stressed importance of f/u with PCP  and taking blood sugar log to MD appt.  Pt stated that she will get money for meter when discharged.

## 2012-07-24 ENCOUNTER — Emergency Department (HOSPITAL_COMMUNITY)
Admission: EM | Admit: 2012-07-24 | Discharge: 2012-07-24 | Disposition: A | Discharge status: Home / Self Care | Payer: PRIVATE HEALTH INSURANCE | Attending: Emergency Medicine | Admitting: Emergency Medicine

## 2012-07-24 ENCOUNTER — Emergency Department (HOSPITAL_COMMUNITY): Payer: PRIVATE HEALTH INSURANCE

## 2012-07-24 ENCOUNTER — Encounter (HOSPITAL_COMMUNITY): Payer: Self-pay

## 2012-07-24 DIAGNOSIS — Z79899 Other long term (current) drug therapy: Secondary | ICD-10-CM | POA: Insufficient documentation

## 2012-07-24 DIAGNOSIS — F17200 Nicotine dependence, unspecified, uncomplicated: Secondary | ICD-10-CM | POA: Insufficient documentation

## 2012-07-24 DIAGNOSIS — E0590 Thyrotoxicosis, unspecified without thyrotoxic crisis or storm: Secondary | ICD-10-CM | POA: Insufficient documentation

## 2012-07-24 DIAGNOSIS — F3289 Other specified depressive episodes: Secondary | ICD-10-CM | POA: Insufficient documentation

## 2012-07-24 DIAGNOSIS — N183 Chronic kidney disease, stage 3 unspecified: Secondary | ICD-10-CM | POA: Insufficient documentation

## 2012-07-24 DIAGNOSIS — Z8673 Personal history of transient ischemic attack (TIA), and cerebral infarction without residual deficits: Secondary | ICD-10-CM | POA: Insufficient documentation

## 2012-07-24 DIAGNOSIS — IMO0002 Reserved for concepts with insufficient information to code with codable children: Secondary | ICD-10-CM | POA: Insufficient documentation

## 2012-07-24 DIAGNOSIS — E785 Hyperlipidemia, unspecified: Secondary | ICD-10-CM | POA: Insufficient documentation

## 2012-07-24 DIAGNOSIS — R5381 Other malaise: Secondary | ICD-10-CM | POA: Insufficient documentation

## 2012-07-24 DIAGNOSIS — Z8719 Personal history of other diseases of the digestive system: Secondary | ICD-10-CM | POA: Insufficient documentation

## 2012-07-24 DIAGNOSIS — R110 Nausea: Secondary | ICD-10-CM | POA: Insufficient documentation

## 2012-07-24 DIAGNOSIS — R1013 Epigastric pain: Secondary | ICD-10-CM | POA: Insufficient documentation

## 2012-07-24 DIAGNOSIS — R109 Unspecified abdominal pain: Secondary | ICD-10-CM

## 2012-07-24 DIAGNOSIS — E119 Type 2 diabetes mellitus without complications: Secondary | ICD-10-CM | POA: Insufficient documentation

## 2012-07-24 DIAGNOSIS — I129 Hypertensive chronic kidney disease with stage 1 through stage 4 chronic kidney disease, or unspecified chronic kidney disease: Secondary | ICD-10-CM | POA: Insufficient documentation

## 2012-07-24 DIAGNOSIS — Z8739 Personal history of other diseases of the musculoskeletal system and connective tissue: Secondary | ICD-10-CM | POA: Insufficient documentation

## 2012-07-24 DIAGNOSIS — Z7982 Long term (current) use of aspirin: Secondary | ICD-10-CM | POA: Insufficient documentation

## 2012-07-24 DIAGNOSIS — H40009 Preglaucoma, unspecified, unspecified eye: Secondary | ICD-10-CM | POA: Insufficient documentation

## 2012-07-24 DIAGNOSIS — J45909 Unspecified asthma, uncomplicated: Secondary | ICD-10-CM | POA: Insufficient documentation

## 2012-07-24 DIAGNOSIS — F329 Major depressive disorder, single episode, unspecified: Secondary | ICD-10-CM | POA: Insufficient documentation

## 2012-07-24 DIAGNOSIS — M109 Gout, unspecified: Secondary | ICD-10-CM | POA: Insufficient documentation

## 2012-07-24 LAB — URINE MICROSCOPIC-ADD ON

## 2012-07-24 LAB — URINALYSIS, ROUTINE W REFLEX MICROSCOPIC
Glucose, UA: 100 mg/dL — AB
Hgb urine dipstick: NEGATIVE
Protein, ur: 100 mg/dL — AB
Urobilinogen, UA: 1 mg/dL (ref 0.0–1.0)

## 2012-07-24 LAB — CBC WITH DIFFERENTIAL/PLATELET
Hemoglobin: 13.5 g/dL (ref 12.0–15.0)
Lymphocytes Relative: 26 % (ref 12–46)
Lymphs Abs: 1.4 10*3/uL (ref 0.7–4.0)
MCH: 27.8 pg (ref 26.0–34.0)
Monocytes Relative: 13 % — ABNORMAL HIGH (ref 3–12)
Neutrophils Relative %: 61 % (ref 43–77)
Platelets: 234 10*3/uL (ref 150–400)
RBC: 4.85 MIL/uL (ref 3.87–5.11)
WBC: 5.5 10*3/uL (ref 4.0–10.5)

## 2012-07-24 LAB — COMPREHENSIVE METABOLIC PANEL
ALT: 15 U/L (ref 0–35)
Alkaline Phosphatase: 122 U/L — ABNORMAL HIGH (ref 39–117)
BUN: 9 mg/dL (ref 6–23)
CO2: 26 mEq/L (ref 19–32)
Chloride: 97 mEq/L (ref 96–112)
GFR calc Af Amer: 77 mL/min — ABNORMAL LOW (ref >90)
GFR calc non Af Amer: 67 mL/min — ABNORMAL LOW (ref >90)
Glucose, Bld: 198 mg/dL — ABNORMAL HIGH (ref 70–99)
Potassium: 3.8 mEq/L (ref 3.5–5.1)
Sodium: 136 mEq/L (ref 135–145)
Total Bilirubin: 0.5 mg/dL (ref 0.3–1.2)

## 2012-07-24 LAB — LIPASE, BLOOD: Lipase: 33 U/L (ref 11–59)

## 2012-07-24 MED ORDER — DICYCLOMINE HCL 20 MG PO TABS: 20.0000 mg | ORAL_TABLET | Freq: Two times a day (BID) | ORAL | Status: DC

## 2012-07-24 MED ORDER — IOHEXOL 300 MG/ML  SOLN
50.0000 mL | Freq: Once | INTRAMUSCULAR | Status: AC | PRN
  Administered 2012-07-24: 50 mL via ORAL

## 2012-07-24 MED ORDER — ONDANSETRON HCL 4 MG/2ML IJ SOLN
4.0000 mg | Freq: Once | INTRAMUSCULAR | Status: AC
  Administered 2012-07-24: 4 mg via INTRAVENOUS
  Filled 2012-07-24: qty 2

## 2012-07-24 MED ORDER — IOHEXOL 300 MG/ML  SOLN
100.0000 mL | Freq: Once | INTRAMUSCULAR | Status: AC | PRN
  Administered 2012-07-24: 100 mL via INTRAVENOUS

## 2012-07-24 MED ORDER — SODIUM CHLORIDE 0.9 % IV SOLN
INTRAVENOUS | Status: DC
  Administered 2012-07-24: 10:00:00 via INTRAVENOUS

## 2012-07-24 MED ORDER — MORPHINE SULFATE 4 MG/ML IJ SOLN
4.0000 mg | Freq: Once | INTRAMUSCULAR | Status: AC
  Administered 2012-07-24: 4 mg via INTRAVENOUS
  Filled 2012-07-24: qty 1

## 2012-07-24 MED ORDER — SUCRALFATE 1 GM/10ML PO SUSP: 1.0000 g | Freq: Four times a day (QID) | ORAL | Status: DC

## 2012-07-24 NOTE — ED Provider Notes (Signed)
History     CSN: [id number]  Arrival date & time 07/24/12  0909   First MD Initiated Contact with Patient 07/24/12 [phone]      Chief Complaint  Patient presents with  . Abdominal Pain  . Weakness    (Consider location/radiation/quality/duration/timing/severity/associated sxs/prior treatment) HPI Comments: Patient presents to the ER for evaluation of upper abdominal pain radiating into her back. Patient reports that she was admitted overnight and for pancreatitis. She has a history of recurrent pancreatitis. Patient reports that since going home, the pain medicine has not helped. She has had increasing sharp stabbing pains in the upper abdomen that radiated to the back on both sides. She has had nausea no vomiting. There has not been fever.  Patient is a 57 y.o. female presenting with abdominal pain and weakness.  Abdominal Pain Associated symptoms: nausea   Associated symptoms: no fever   Weakness Associated symptoms include abdominal pain.    Past Medical History  Diagnosis Date  . Asthma     past 10 years  . Arthritis     djd -lower back  . Diabetes mellitus     past 5 years  . Glaucoma(365)     dx 06/2011-[facility]  . Hypertension   . Hyperlipidemia   . Hyperthyroidism   . History of TIA (transient ischemic attack)     01/06/2011  . Pancreatitis   . Degenerative disc disease   . Gout   . Depression   . Renal disorder     chronic kidney disease, stage 3    Past Surgical History  Procedure Laterality Date  . Vaginal hysterectomy  1990    Family History  Problem Relation Age of Onset  . Arthritis Mother   . Heart disease      mother & father  . Hypertension      mother & father  . Colon cancer Neg Hx   . Diabetes Mother   . Prostate cancer Neg Hx   . Breast cancer Neg Hx     History  Substance Use Topics  . Smoking status: Current Every Day Smoker -- 1.00 packs/day for 30 years    Types: Cigarettes  . Smokeless tobacco: Never Used  .  Alcohol Use: No    OB History   Grav Para Term Preterm Abortions TAB SAB Ect Mult Living                  Review of Systems  Constitutional: Positive for appetite change. Negative for fever.  Gastrointestinal: Positive for nausea and abdominal pain.  Neurological: Positive for weakness.  All other systems reviewed and are negative.    Allergies  Metformin and related and Vicodin  Home Medications   Current Outpatient Rx  Name  Route  Sig  Dispense  Refill  . allopurinol (ZYLOPRIM) 300 MG tablet   Oral   Take 1 tablet (300 mg total) by mouth daily.   30 tablet   0   . aspirin 81 MG EC tablet   Oral   Take 1 tablet (81 mg total) by mouth daily. For platelet aggregation   30 tablet   0   . brimonidine (ALPHAGAN P) 0.1 % SOLN   Both Eyes   Place 1 drop into both eyes at bedtime. For allergic rhinitis         . busPIRone (BUSPAR) 15 MG tablet   Oral   Take 1 tablet (15 mg total) by mouth 3 (three) times daily. For anxiety  90 tablet   0   . colchicine 0.6 MG tablet   Oral   Take 0.6 mg by mouth daily as needed. For gout flare ups         . divalproex (DEPAKOTE ER) 500 MG 24 hr tablet   Oral   Take 2 tablets (1,000 mg total) by mouth at bedtime. For mood stabilization   60 tablet   0   . fluticasone (FLOVENT HFA) 44 MCG/ACT inhaler   Inhalation   Inhale 1 puff into the lungs 2 (two) times daily.         Marland Kitchen gemfibrozil (LOPID) 600 MG tablet   Oral   Take 1 tablet (600 mg total) by mouth 2 (two) times daily before a meal.   60 tablet   0   . glipiZIDE (GLUCOTROL) 10 MG tablet   Oral   Take 1 tablet (10 mg total) by mouth daily.   30 tablet   0   . HYDROmorphone (DILAUDID) 4 MG tablet   Oral   Take 1 tablet (4 mg total) by mouth every 4 (four) hours as needed for pain.   45 tablet   0   . latanoprost (XALATAN) 0.005 % ophthalmic solution   Both Eyes   Place 1 drop into both eyes at bedtime.         Marland Kitchen levothyroxine (SYNTHROID,  LEVOTHROID) 200 MCG tablet   Oral   Take 1 tablet (200 mcg total) by mouth daily before breakfast. For hypothyroiduism   30 tablet   0   . lisinopril (PRINIVIL,ZESTRIL) 40 MG tablet   Oral   Take 1 tablet (40 mg total) by mouth daily. For high bp   30 tablet   0   . nicotine (NICODERM CQ - DOSED IN MG/24 HOURS) 14 mg/24hr patch   Transdermal   Place 1 patch onto the skin daily.         . ondansetron (ZOFRAN) 4 MG tablet   Oral   Take 1 tablet (4 mg total) by mouth every 6 (six) hours as needed for nausea.   20 tablet   0   . oxyCODONE-acetaminophen (PERCOCET) 7.5-325 MG per tablet   Oral   Take 1 tablet by mouth 3 (three) times daily. As needed for back pain   30 tablet   0   . pantoprazole (PROTONIX) 40 MG tablet   Oral   Take 1 tablet (40 mg total) by mouth daily.   30 tablet   0   . sertraline (ZOLOFT) 25 MG tablet   Oral   Take 2 tablets (50 mg total) by mouth daily.   30 tablet   0   . traZODone (DESYREL) 100 MG tablet   Oral   Take 1 tablet (100 mg total) by mouth at bedtime. insomnia   30 tablet   0     BP 164/86  Pulse 78  Temp(Src) 98.1 F (36.7 C) (Oral)  Resp 18  SpO2 97%  Physical Exam  Constitutional: She is oriented to person, place, and time. She appears well-developed and well-nourished. No distress.  HENT:  Head: Normocephalic and atraumatic.  Right Ear: Hearing normal.  Nose: Nose normal.  Mouth/Throat: Oropharynx is clear and moist and mucous membranes are normal.  Eyes: Conjunctivae and EOM are normal. Pupils are equal, round, and reactive to light.  Neck: Normal range of motion. Neck supple.  Cardiovascular: Normal rate, regular rhythm, S1 normal and S2 normal.  Exam reveals no gallop and no  friction rub.   No murmur heard. Pulmonary/Chest: Effort normal and breath sounds normal. No respiratory distress. She exhibits no tenderness.  Abdominal: Soft. Normal appearance and bowel sounds are normal. There is no hepatosplenomegaly.  There is tenderness in the epigastric area. There is no rebound, no guarding, no tenderness at McBurney's point and negative Murphy's sign. No hernia.  Musculoskeletal: Normal range of motion.  Neurological: She is alert and oriented to person, place, and time. She has normal strength. No cranial nerve deficit or sensory deficit. Coordination normal. GCS eye subscore is 4. GCS verbal subscore is 5. GCS motor subscore is 6.  Skin: Skin is warm, dry and intact. No rash noted. No cyanosis.  Psychiatric: She has a normal mood and affect. Her speech is normal and behavior is normal. Thought content normal.    ED Course  Procedures (including critical care time)  Labs Reviewed  CBC WITH DIFFERENTIAL  COMPREHENSIVE METABOLIC PANEL  LIPASE, BLOOD  AMYLASE  URINALYSIS, ROUTINE W REFLEX MICROSCOPIC   Ct Abdomen Pelvis W Contrast  07/24/2012  *RADIOLOGY REPORT*  Clinical Data: Right upper abdominal pain with radiation into back for 6 days.  Recent admission for pancreatitis.  CT ABDOMEN AND PELVIS WITH CONTRAST  Technique:  Multidetector CT imaging of the abdomen and pelvis was performed following the standard protocol during bolus administration of intravenous contrast.  Contrast: OMNIPAQUE IOHEXOL 300 MG/ML  SOLN .  Comparison: 10/03/2011 from [facility] .  Findings: Lung bases:  Clear lung bases.  Mild cardiomegaly.  Abdomen/pelvis:  Moderate hepatic steatosis.  No focal liver lesion.  Normal spleen. Gastric underdistention.  Normal pancreas, without evidence of pancreatitis, ductal dilatation, or peripancreatic fluid collection.  Normal gallbladder, biliary tract, adrenal glands, kidneys  No retroperitoneal or retrocrural adenopathy.  Scattered colonic diverticula.  Sigmoid colonic underdistension. Normal terminal ileum and appendix.  Normal small bowel without abdominal ascites.  No pelvic adenopathy.    Normal urinary bladder.  Hysterectomy.  No adnexal mass.  No significant free fluid.   Bones/Musculoskeletal:  No acute osseous abnormality.  IMPRESSION:  1. No acute process in the abdomen or pelvis. 2.  Hepatic steatosis, mild. 3.  No evidence of pancreatitis, nor its complications.   Original Report Authenticated By: Jeronimo Greaves, M.D.      Diagnosis: Abdominal pain    MDM  Patient comes to the ER with plates of abdominal pain. Pain is sharp and upper abdomen, radiating to the back. She reports it feels similar to when she has had pancreatitis in the past. Patient was just admitted and discharged for pancreatitis within the last few days. Her lipase was mildly elevated during admission. It is normal today. A CAT scan was performed to further evaluate for other etiology as well as rule out competitions such as pseudocyst. CAT scan was unremarkable. Patient discharged home.       Gilda Crease, MD 07/24/12 [phone]

## 2012-07-24 NOTE — ED Notes (Signed)
Pt in restroom 

## 2012-07-24 NOTE — ED Notes (Signed)
Pt c/o upper abdominal pain radiating into her back x 6 days.  Pain score 9/10.  Sts she was seen at [facility], on Saturday for same complaint.  Sts she was admitted for Pancreatisis and discharged on Monday.  Sts she has started feeling week and unable to "shake these feelings."  Sts she has been taking her medication as prescribed.  Vitals are stable.  NAD noted.

## 2012-07-24 NOTE — ED Notes (Signed)
Patient transported to CT 

## 2012-07-24 NOTE — ED Notes (Signed)
MD at bedside. 

## 2012-07-24 NOTE — ED Notes (Signed)
Pt escorted to discharge window. Verbalized understanding discharge instructions. In no acute distress.   

## 2012-07-25 LAB — URINE CULTURE

## 2012-10-08 ENCOUNTER — Emergency Department (HOSPITAL_COMMUNITY)
Admission: EM | Admit: 2012-10-08 | Discharge: 2012-10-08 | Disposition: A | Discharge status: Home / Self Care | Payer: PRIVATE HEALTH INSURANCE | Attending: Emergency Medicine | Admitting: Emergency Medicine

## 2012-10-08 ENCOUNTER — Encounter (HOSPITAL_COMMUNITY): Payer: Self-pay | Admitting: Emergency Medicine

## 2012-10-08 DIAGNOSIS — M129 Arthropathy, unspecified: Secondary | ICD-10-CM | POA: Insufficient documentation

## 2012-10-08 DIAGNOSIS — Z79899 Other long term (current) drug therapy: Secondary | ICD-10-CM | POA: Insufficient documentation

## 2012-10-08 DIAGNOSIS — I129 Hypertensive chronic kidney disease with stage 1 through stage 4 chronic kidney disease, or unspecified chronic kidney disease: Secondary | ICD-10-CM | POA: Insufficient documentation

## 2012-10-08 DIAGNOSIS — F329 Major depressive disorder, single episode, unspecified: Secondary | ICD-10-CM | POA: Insufficient documentation

## 2012-10-08 DIAGNOSIS — E119 Type 2 diabetes mellitus without complications: Secondary | ICD-10-CM | POA: Insufficient documentation

## 2012-10-08 DIAGNOSIS — F3289 Other specified depressive episodes: Secondary | ICD-10-CM | POA: Insufficient documentation

## 2012-10-08 DIAGNOSIS — N183 Chronic kidney disease, stage 3 unspecified: Secondary | ICD-10-CM | POA: Insufficient documentation

## 2012-10-08 DIAGNOSIS — K859 Acute pancreatitis without necrosis or infection, unspecified: Secondary | ICD-10-CM | POA: Insufficient documentation

## 2012-10-08 DIAGNOSIS — Z8739 Personal history of other diseases of the musculoskeletal system and connective tissue: Secondary | ICD-10-CM | POA: Insufficient documentation

## 2012-10-08 DIAGNOSIS — Z8669 Personal history of other diseases of the nervous system and sense organs: Secondary | ICD-10-CM | POA: Insufficient documentation

## 2012-10-08 DIAGNOSIS — J45909 Unspecified asthma, uncomplicated: Secondary | ICD-10-CM | POA: Insufficient documentation

## 2012-10-08 DIAGNOSIS — E0590 Thyrotoxicosis, unspecified without thyrotoxic crisis or storm: Secondary | ICD-10-CM | POA: Insufficient documentation

## 2012-10-08 DIAGNOSIS — Z765 Malingerer [conscious simulation]: Secondary | ICD-10-CM | POA: Insufficient documentation

## 2012-10-08 DIAGNOSIS — M109 Gout, unspecified: Secondary | ICD-10-CM | POA: Insufficient documentation

## 2012-10-08 DIAGNOSIS — Z7982 Long term (current) use of aspirin: Secondary | ICD-10-CM | POA: Insufficient documentation

## 2012-10-08 DIAGNOSIS — IMO0002 Reserved for concepts with insufficient information to code with codable children: Secondary | ICD-10-CM | POA: Insufficient documentation

## 2012-10-08 DIAGNOSIS — F17200 Nicotine dependence, unspecified, uncomplicated: Secondary | ICD-10-CM | POA: Insufficient documentation

## 2012-10-08 DIAGNOSIS — R197 Diarrhea, unspecified: Secondary | ICD-10-CM | POA: Insufficient documentation

## 2012-10-08 DIAGNOSIS — Z8673 Personal history of transient ischemic attack (TIA), and cerebral infarction without residual deficits: Secondary | ICD-10-CM | POA: Insufficient documentation

## 2012-10-08 DIAGNOSIS — E785 Hyperlipidemia, unspecified: Secondary | ICD-10-CM | POA: Insufficient documentation

## 2012-10-08 LAB — COMPREHENSIVE METABOLIC PANEL
Albumin: 3.9 g/dL (ref 3.5–5.2)
Alkaline Phosphatase: 127 U/L — ABNORMAL HIGH (ref 39–117)
BUN: 10 mg/dL (ref 6–23)
Creatinine, Ser: 1.26 mg/dL — ABNORMAL HIGH (ref 0.50–1.10)
Potassium: 4.2 mEq/L (ref 3.5–5.1)
Total Protein: 7.7 g/dL (ref 6.0–8.3)

## 2012-10-08 LAB — CBC WITH DIFFERENTIAL/PLATELET
Basophils Relative: 0 % (ref 0–1)
Eosinophils Absolute: 0.1 10*3/uL (ref 0.0–0.7)
Hemoglobin: 13.1 g/dL (ref 12.0–15.0)
MCH: 28 pg (ref 26.0–34.0)
MCHC: 33.2 g/dL (ref 30.0–36.0)
Monocytes Relative: 6 % (ref 3–12)
Neutrophils Relative %: 64 % (ref 43–77)

## 2012-10-08 LAB — URINALYSIS, ROUTINE W REFLEX MICROSCOPIC
Nitrite: NEGATIVE
Specific Gravity, Urine: 1.02 (ref 1.005–1.030)
pH: 7 (ref 5.0–8.0)

## 2012-10-08 LAB — LIPASE, BLOOD: Lipase: 40 U/L (ref 11–59)

## 2012-10-08 LAB — URINE MICROSCOPIC-ADD ON

## 2012-10-08 NOTE — ED Notes (Signed)
Pt w/ hx of pancreatitis c/o NVD, abd pain.  Pt is asking specifically for Dr. Blinda Leatherwood because he gave her a combination of percocet and dilaudid rx and states she wants that again.

## 2012-10-08 NOTE — ED Provider Notes (Signed)
History     CSN: [id number]  Arrival date & time 10/08/12  1041   First MD Initiated Contact with Patient 10/08/12 1121      Chief Complaint  Patient presents with  . Pancreatitis  . Nausea  . Emesis  . Diarrhea    (Consider location/radiation/quality/duration/timing/severity/associated sxs/prior treatment) HPI Comments: [name] is a 57 y.o. Female who is here for evaluation of nausea, vomiting, diarrhea, and abdominal pain. The symptoms are worse in in the last 5 or 6 days after she ran out of pain medicines, about 10 days ago. She was hospitalized late 2/14 with mild pancreatitis. She followed up in emergency department several days later for additional evaluation and was prescribed additional pain medicines. At hospital discharge she received prescriptions for Percocet and Dilaudid. At followup in emergency department; she received Rx for Carafate and Dicyclomine, no narcotics, by Dr. Blinda Leatherwood. She states that the ED doctor on 07/24/12, gave her prescriptions for Dilaudid, #45, and Percocet, # 30. She is asking for more pain medicines, "like Dr Blinda Leatherwood gave me", because she cannot see her primary care doctor until an appointment in June. She is also seeking a referral to another primary care doctor in [place], and a GI doctor, in [place]. She states that she has seen a GI doctor in [place]. She has trouble getting to [facility], by her report. The patient is an emergency department, at the same time as another patient, for whom she manages his pain by monitoring his narcotic medication, for administration purposes. She denies fever, chills, weakness, or dizziness. There are no known modifying factors.  Patient is a 57 y.o. female presenting with vomiting and diarrhea. The history is provided by the patient.  Emesis Associated symptoms: diarrhea   Diarrhea Associated symptoms: vomiting     Past Medical History  Diagnosis Date  . Asthma     past 10 years  . Arthritis      djd -lower back  . Diabetes mellitus     past 5 years  . Glaucoma(365)     dx 06/2011-[facility]  . Hypertension   . Hyperlipidemia   . Hyperthyroidism   . History of TIA (transient ischemic attack)     01/06/2011  . Pancreatitis   . Degenerative disc disease   . Gout   . Depression   . Renal disorder     chronic kidney disease, stage 3    Past Surgical History  Procedure Laterality Date  . Vaginal hysterectomy  1990    Family History  Problem Relation Age of Onset  . Arthritis Mother   . Heart disease      mother & father  . Hypertension      mother & father  . Colon cancer Neg Hx   . Diabetes Mother   . Prostate cancer Neg Hx   . Breast cancer Neg Hx     History  Substance Use Topics  . Smoking status: Current Every Day Smoker -- 1.00 packs/day for 30 years    Types: Cigarettes  . Smokeless tobacco: Never Used  . Alcohol Use: No    OB History   Grav Para Term Preterm Abortions TAB SAB Ect Mult Living                  Review of Systems  Gastrointestinal: Positive for vomiting and diarrhea.  All other systems reviewed and are negative.    Allergies  Metformin and related and Vicodin  Home Medications  Current Outpatient Rx  Name  Route  Sig  Dispense  Refill  . allopurinol (ZYLOPRIM) 300 MG tablet   Oral   Take 1 tablet (300 mg total) by mouth daily.   30 tablet   0   . aspirin 81 MG EC tablet   Oral   Take 1 tablet (81 mg total) by mouth daily. For platelet aggregation   30 tablet   0   . brimonidine (ALPHAGAN P) 0.1 % SOLN   Both Eyes   Place 1 drop into both eyes at bedtime. For allergic rhinitis         . busPIRone (BUSPAR) 15 MG tablet   Oral   Take 1 tablet (15 mg total) by mouth 3 (three) times daily. For anxiety   90 tablet   0   . dicyclomine (BENTYL) 20 MG tablet   Oral   Take 1 tablet (20 mg total) by mouth 2 (two) times daily.   20 tablet   0   . divalproex (DEPAKOTE ER) 500 MG 24 hr tablet   Oral    Take 2 tablets (1,000 mg total) by mouth at bedtime. For mood stabilization   60 tablet   0   . fluticasone (FLOVENT HFA) 44 MCG/ACT inhaler   Inhalation   Inhale 1 puff into the lungs 2 (two) times daily.         Marland Kitchen gemfibrozil (LOPID) 600 MG tablet   Oral   Take 1 tablet (600 mg total) by mouth 2 (two) times daily before a meal.   60 tablet   0   . glipiZIDE (GLUCOTROL) 10 MG tablet   Oral   Take 1 tablet (10 mg total) by mouth daily.   30 tablet   0   . HYDROmorphone (DILAUDID) 4 MG tablet   Oral   Take 1 tablet (4 mg total) by mouth every 4 (four) hours as needed for pain.   45 tablet   0   . latanoprost (XALATAN) 0.005 % ophthalmic solution   Both Eyes   Place 1 drop into both eyes at bedtime.         Marland Kitchen levothyroxine (SYNTHROID, LEVOTHROID) 200 MCG tablet   Oral   Take 1 tablet (200 mcg total) by mouth daily before breakfast. For hypothyroiduism   30 tablet   0   . nicotine (NICODERM CQ - DOSED IN MG/24 HOURS) 14 mg/24hr patch   Transdermal   Place 1 patch onto the skin daily.         . ondansetron (ZOFRAN) 4 MG tablet   Oral   Take 1 tablet (4 mg total) by mouth every 6 (six) hours as needed for nausea.   20 tablet   0   . oxyCODONE-acetaminophen (PERCOCET) 7.5-325 MG per tablet   Oral   Take 1 tablet by mouth every 8 (eight) hours as needed for pain.         . pantoprazole (PROTONIX) 40 MG tablet   Oral   Take 1 tablet (40 mg total) by mouth daily.   30 tablet   0   . sertraline (ZOLOFT) 25 MG tablet   Oral   Take 2 tablets (50 mg total) by mouth daily.   30 tablet   0   . sucralfate (CARAFATE) 1 GM/10ML suspension   Oral   Take 10 mLs (1 g total) by mouth 4 (four) times daily.   420 mL   0   . traZODone (DESYREL) 100 MG tablet  Oral   Take 1 tablet (100 mg total) by mouth at bedtime. insomnia   30 tablet   0   . colchicine 0.6 MG tablet   Oral   Take 0.6 mg by mouth daily as needed. For gout flare ups         .  lisinopril (PRINIVIL,ZESTRIL) 40 MG tablet   Oral   Take 1 tablet (40 mg total) by mouth daily. For high bp   30 tablet   0     BP 159/101  Pulse 75  Temp(Src) 97.8 F (36.6 C) (Oral)  Resp 16  Ht 5\' 2"  (1.575 m)  Wt 196 lb (88.905 kg)  BMI 35.84 kg/m2  SpO2 97%  Physical Exam  Nursing note and vitals reviewed. Constitutional: She is oriented to person, place, and time. She appears well-developed and well-nourished. No distress.  HENT:  Head: Normocephalic and atraumatic.  Eyes: Conjunctivae and EOM are normal. Pupils are equal, round, and reactive to light.  Neck: Normal range of motion and phonation normal. Neck supple.  Cardiovascular: Normal rate, regular rhythm and intact distal pulses.   Pulmonary/Chest: Effort normal and breath sounds normal. She exhibits no tenderness.  Abdominal: Soft. Bowel sounds are normal. She exhibits no distension. There is no tenderness (Bilateral epigastric, moderate.). There is no guarding.  Musculoskeletal: Normal range of motion.  Neurological: She is alert and oriented to person, place, and time. She has normal strength. She exhibits normal muscle tone.  Skin: Skin is warm and dry.  Psychiatric: She has a normal mood and affect. Her behavior is normal. Judgment and thought content normal.    ED Course  Procedures (including critical care time)   [facility] substance abuse databank queried, and the data is reviewed. The data indicates that she receives multiple prescriptions from multiple providers, fills at multiple pharmacies, fills sequential prescriptions in very short duration above the prescription use instruction. This is strong evidence for narcotic diversion behavior. Patient was informed that this is criminally illegal. She was informed that she would not be receiving narcotic pain medicines from me and I doubted other emergency department providers would give them to her.  Additional specific findings: She was discharged  discharge from the hospital on 07/21/12 with prescriptions in hand for Dilaudid 4 mg, #45 and Percocet 7.5  mg, #30. These 2 prescriptions were ultimately filled on 09/22/12. On 07/23/12,she filled a prescription written by her primary care doctor for #90 oxycodone 10 mg. She sought ED evaluation on 07/24/12 for Abdominal Pain.     Labs Reviewed  GLUCOSE, CAPILLARY - Abnormal; Notable for the following:    Glucose-Capillary 184 (*)    All other components within normal limits  CBC WITH DIFFERENTIAL - Abnormal; Notable for the following:    RDW 16.0 (*)    All other components within normal limits  COMPREHENSIVE METABOLIC PANEL - Abnormal; Notable for the following:    Glucose, Bld 179 (*)    Creatinine, Ser 1.26 (*)    Alkaline Phosphatase 127 (*)    Total Bilirubin 0.2 (*)    GFR calc non Af Amer 47 (*)    GFR calc Af Amer 55 (*)    All other components within normal limits  URINALYSIS, ROUTINE W REFLEX MICROSCOPIC - Abnormal; Notable for the following:    APPearance CLOUDY (*)    Leukocytes, UA SMALL (*)    All other components within normal limits  URINE MICROSCOPIC-ADD ON - Abnormal; Notable for the following:  Squamous Epithelial / LPF MANY (*)    All other components within normal limits  LIPASE, BLOOD      1. 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  Nursing Notes Reviewed/ Care Coordinated, and agree without changes. Applicable Imaging Reviewed.  Interpretation of Laboratory Data incorporated into ED treatment   Plan: Home Medications- usual except narcotics; Home Treatments- stop illegal behavior; Recommended follow up- PCP prn       Flint Melter, MD 10/08/12 1356

## 2012-10-08 NOTE — ED Notes (Addendum)
Charge nurse talked with pt. Pt requesting EDP supervisor. Pt given Dr Jonn Shingles name, no contact information given. Pt unhappy about pain management, pt has pain control md.

## 2012-10-29 ENCOUNTER — Emergency Department (HOSPITAL_COMMUNITY): Payer: PRIVATE HEALTH INSURANCE

## 2012-10-29 ENCOUNTER — Emergency Department (HOSPITAL_COMMUNITY)
Admission: EM | Admit: 2012-10-29 | Discharge: 2012-10-29 | Disposition: A | Discharge status: Home / Self Care | Payer: PRIVATE HEALTH INSURANCE | Attending: Emergency Medicine | Admitting: Emergency Medicine

## 2012-10-29 DIAGNOSIS — Z8639 Personal history of other endocrine, nutritional and metabolic disease: Secondary | ICD-10-CM | POA: Insufficient documentation

## 2012-10-29 DIAGNOSIS — S199XXA Unspecified injury of neck, initial encounter: Secondary | ICD-10-CM | POA: Insufficient documentation

## 2012-10-29 DIAGNOSIS — E1129 Type 2 diabetes mellitus with other diabetic kidney complication: Secondary | ICD-10-CM | POA: Insufficient documentation

## 2012-10-29 DIAGNOSIS — N183 Chronic kidney disease, stage 3 unspecified: Secondary | ICD-10-CM | POA: Insufficient documentation

## 2012-10-29 DIAGNOSIS — M7918 Myalgia, other site: Secondary | ICD-10-CM

## 2012-10-29 DIAGNOSIS — E0590 Thyrotoxicosis, unspecified without thyrotoxic crisis or storm: Secondary | ICD-10-CM | POA: Insufficient documentation

## 2012-10-29 DIAGNOSIS — Z8739 Personal history of other diseases of the musculoskeletal system and connective tissue: Secondary | ICD-10-CM | POA: Insufficient documentation

## 2012-10-29 DIAGNOSIS — J45909 Unspecified asthma, uncomplicated: Secondary | ICD-10-CM | POA: Insufficient documentation

## 2012-10-29 DIAGNOSIS — F17200 Nicotine dependence, unspecified, uncomplicated: Secondary | ICD-10-CM | POA: Insufficient documentation

## 2012-10-29 DIAGNOSIS — S0993XA Unspecified injury of face, initial encounter: Secondary | ICD-10-CM | POA: Insufficient documentation

## 2012-10-29 DIAGNOSIS — Z8719 Personal history of other diseases of the digestive system: Secondary | ICD-10-CM | POA: Insufficient documentation

## 2012-10-29 DIAGNOSIS — F329 Major depressive disorder, single episode, unspecified: Secondary | ICD-10-CM | POA: Insufficient documentation

## 2012-10-29 DIAGNOSIS — I129 Hypertensive chronic kidney disease with stage 1 through stage 4 chronic kidney disease, or unspecified chronic kidney disease: Secondary | ICD-10-CM | POA: Insufficient documentation

## 2012-10-29 DIAGNOSIS — Z862 Personal history of diseases of the blood and blood-forming organs and certain disorders involving the immune mechanism: Secondary | ICD-10-CM | POA: Insufficient documentation

## 2012-10-29 DIAGNOSIS — Z7982 Long term (current) use of aspirin: Secondary | ICD-10-CM | POA: Insufficient documentation

## 2012-10-29 DIAGNOSIS — Z8669 Personal history of other diseases of the nervous system and sense organs: Secondary | ICD-10-CM | POA: Insufficient documentation

## 2012-10-29 DIAGNOSIS — Y9389 Activity, other specified: Secondary | ICD-10-CM | POA: Insufficient documentation

## 2012-10-29 DIAGNOSIS — E785 Hyperlipidemia, unspecified: Secondary | ICD-10-CM | POA: Insufficient documentation

## 2012-10-29 DIAGNOSIS — Z8673 Personal history of transient ischemic attack (TIA), and cerebral infarction without residual deficits: Secondary | ICD-10-CM | POA: Insufficient documentation

## 2012-10-29 DIAGNOSIS — F3289 Other specified depressive episodes: Secondary | ICD-10-CM | POA: Insufficient documentation

## 2012-10-29 DIAGNOSIS — Z79899 Other long term (current) drug therapy: Secondary | ICD-10-CM | POA: Insufficient documentation

## 2012-10-29 DIAGNOSIS — IMO0002 Reserved for concepts with insufficient information to code with codable children: Secondary | ICD-10-CM | POA: Insufficient documentation

## 2012-10-29 MED ORDER — IBUPROFEN 400 MG PO TABS
400.0000 mg | ORAL_TABLET | Freq: Once | ORAL | Status: AC
  Administered 2012-10-29: 400 mg via ORAL
  Filled 2012-10-29: qty 1
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
## 2012-10-30 NOTE — ED Provider Notes (Signed)
Medical screening examination/treatment/procedure(s) were performed by non-physician practitioner and as supervising physician I was immediately available for consultation/collaboration.   Evoleht Hovatter E Samanvitha Germany, MD 10/30/12 0728 

## 2012-11-07 ENCOUNTER — Encounter (HOSPITAL_COMMUNITY): Payer: Self-pay | Admitting: *Deleted

## 2012-11-07 ENCOUNTER — Observation Stay (HOSPITAL_COMMUNITY)
Admission: EM | Admit: 2012-11-07 | Discharge: 2012-11-09 | Disposition: A | Discharge status: Home / Self Care | Payer: PRIVATE HEALTH INSURANCE | Attending: Internal Medicine | Admitting: Internal Medicine

## 2012-11-07 ENCOUNTER — Emergency Department (HOSPITAL_COMMUNITY): Payer: PRIVATE HEALTH INSURANCE

## 2012-11-07 DIAGNOSIS — I10 Essential (primary) hypertension: Secondary | ICD-10-CM | POA: Insufficient documentation

## 2012-11-07 DIAGNOSIS — F17200 Nicotine dependence, unspecified, uncomplicated: Secondary | ICD-10-CM | POA: Insufficient documentation

## 2012-11-07 DIAGNOSIS — E119 Type 2 diabetes mellitus without complications: Secondary | ICD-10-CM | POA: Insufficient documentation

## 2012-11-07 DIAGNOSIS — R112 Nausea with vomiting, unspecified: Secondary | ICD-10-CM | POA: Insufficient documentation

## 2012-11-07 DIAGNOSIS — E785 Hyperlipidemia, unspecified: Secondary | ICD-10-CM | POA: Insufficient documentation

## 2012-11-07 DIAGNOSIS — Z8673 Personal history of transient ischemic attack (TIA), and cerebral infarction without residual deficits: Secondary | ICD-10-CM | POA: Insufficient documentation

## 2012-11-07 DIAGNOSIS — M109 Gout, unspecified: Secondary | ICD-10-CM | POA: Diagnosis present

## 2012-11-07 DIAGNOSIS — F1420 Cocaine dependence, uncomplicated: Secondary | ICD-10-CM | POA: Insufficient documentation

## 2012-11-07 DIAGNOSIS — R079 Chest pain, unspecified: Principal | ICD-10-CM | POA: Insufficient documentation

## 2012-11-07 DIAGNOSIS — R0602 Shortness of breath: Secondary | ICD-10-CM | POA: Insufficient documentation

## 2012-11-07 DIAGNOSIS — E1149 Type 2 diabetes mellitus with other diabetic neurological complication: Secondary | ICD-10-CM | POA: Diagnosis present

## 2012-11-07 DIAGNOSIS — F209 Schizophrenia, unspecified: Secondary | ICD-10-CM | POA: Diagnosis present

## 2012-11-07 DIAGNOSIS — M549 Dorsalgia, unspecified: Secondary | ICD-10-CM | POA: Insufficient documentation

## 2012-11-07 DIAGNOSIS — E039 Hypothyroidism, unspecified: Secondary | ICD-10-CM | POA: Diagnosis present

## 2012-11-07 LAB — HEMOGLOBIN A1C: Mean Plasma Glucose: 206 mg/dL — ABNORMAL HIGH (ref <117)

## 2012-11-07 LAB — GLUCOSE, CAPILLARY: Glucose-Capillary: 212 mg/dL — ABNORMAL HIGH (ref 70–99)

## 2012-11-07 LAB — CBC WITH DIFFERENTIAL/PLATELET
Basophils Relative: 1 % (ref 0–1)
Eosinophils Absolute: 0.1 10*3/uL (ref 0.0–0.7)
HCT: 38 % (ref 36.0–46.0)
Hemoglobin: 12.7 g/dL (ref 12.0–15.0)
Lymphs Abs: 2.3 10*3/uL (ref 0.7–4.0)
MCH: 28.3 pg (ref 26.0–34.0)
MCHC: 33.4 g/dL (ref 30.0–36.0)
Monocytes Absolute: 0.3 10*3/uL (ref 0.1–1.0)
Monocytes Relative: 5 % (ref 3–12)
Neutro Abs: 3.3 10*3/uL (ref 1.7–7.7)
Neutrophils Relative %: 55 % (ref 43–77)
RBC: 4.48 MIL/uL (ref 3.87–5.11)

## 2012-11-07 LAB — RAPID URINE DRUG SCREEN, HOSP PERFORMED
Barbiturates: NOT DETECTED
Cocaine: NOT DETECTED
Tetrahydrocannabinol: NOT DETECTED

## 2012-11-07 LAB — URINALYSIS, ROUTINE W REFLEX MICROSCOPIC
Bilirubin Urine: NEGATIVE
Hgb urine dipstick: NEGATIVE
Ketones, ur: NEGATIVE mg/dL
Nitrite: NEGATIVE
Protein, ur: NEGATIVE mg/dL
Urobilinogen, UA: 0.2 mg/dL (ref 0.0–1.0)

## 2012-11-07 LAB — COMPREHENSIVE METABOLIC PANEL
Alkaline Phosphatase: 122 U/L — ABNORMAL HIGH (ref 39–117)
BUN: 11 mg/dL (ref 6–23)
Chloride: 100 mEq/L (ref 96–112)
Creatinine, Ser: 1.15 mg/dL — ABNORMAL HIGH (ref 0.50–1.10)
GFR calc Af Amer: 61 mL/min — ABNORMAL LOW (ref >90)
GFR calc non Af Amer: 53 mL/min — ABNORMAL LOW (ref >90)
Glucose, Bld: 184 mg/dL — ABNORMAL HIGH (ref 70–99)
Potassium: 3.7 mEq/L (ref 3.5–5.1)
Total Bilirubin: 0.2 mg/dL — ABNORMAL LOW (ref 0.3–1.2)

## 2012-11-07 LAB — TROPONIN I
Troponin I: <0.3 ng/mL (ref <0.30)
Troponin I: <0.3 ng/mL (ref <0.30)
Troponin I: <0.3 ng/mL (ref <0.30)

## 2012-11-07 LAB — T4, FREE: Free T4: 0.77 ng/dL — ABNORMAL LOW (ref 0.80–1.80)

## 2012-11-07 LAB — PROTIME-INR
INR: 0.92 (ref 0.00–1.49)
Prothrombin Time: 12.3 seconds (ref 11.6–15.2)

## 2012-11-07 LAB — D-DIMER, QUANTITATIVE: D-Dimer, Quant: 0.39 ug/mL-FEU (ref 0.00–0.48)

## 2012-11-07 LAB — URINE MICROSCOPIC-ADD ON

## 2012-11-07 MED ORDER — ATORVASTATIN CALCIUM 80 MG PO TABS
80.0000 mg | ORAL_TABLET | Freq: Every day | ORAL | Status: DC
  Administered 2012-11-07 – 2012-11-08 (×2): 80 mg via ORAL
  Filled 2012-11-07 (×3): qty 1

## 2012-11-07 MED ORDER — OXYCODONE HCL 5 MG PO TABS
2.5000 mg | ORAL_TABLET | Freq: Three times a day (TID) | ORAL | Status: DC | PRN
  Administered 2012-11-07 – 2012-11-08 (×2): 2.5 mg via ORAL
  Filled 2012-11-07 (×3): qty 1

## 2012-11-07 MED ORDER — ALLOPURINOL 300 MG PO TABS
300.0000 mg | ORAL_TABLET | Freq: Every day | ORAL | Status: DC
  Administered 2012-11-08 – 2012-11-09 (×2): 300 mg via ORAL
  Filled 2012-11-07 (×2): qty 1

## 2012-11-07 MED ORDER — NITROGLYCERIN 0.4 MG SL SUBL: 0.4000 mg | SUBLINGUAL_TABLET | SUBLINGUAL | Status: DC | PRN

## 2012-11-07 MED ORDER — SERTRALINE HCL 50 MG PO TABS
50.0000 mg | ORAL_TABLET | Freq: Every day | ORAL | Status: DC
  Administered 2012-11-08 – 2012-11-09 (×2): 50 mg via ORAL
  Filled 2012-11-07 (×2): qty 1

## 2012-11-07 MED ORDER — FLUTICASONE PROPIONATE HFA 44 MCG/ACT IN AERO
1.0000 | INHALATION_SPRAY | Freq: Two times a day (BID) | RESPIRATORY_TRACT | Status: DC
  Administered 2012-11-07 – 2012-11-09 (×3): 1 via RESPIRATORY_TRACT
  Filled 2012-11-07: qty 10.6

## 2012-11-07 MED ORDER — METRONIDAZOLE 500 MG PO TABS
2000.0000 mg | ORAL_TABLET | Freq: Once | ORAL | Status: AC
  Administered 2012-11-07: 2000 mg via ORAL
  Filled 2012-11-07: qty 4

## 2012-11-07 MED ORDER — SUCRALFATE 1 GM/10ML PO SUSP
1.0000 g | Freq: Four times a day (QID) | ORAL | Status: DC
  Administered 2012-11-07 – 2012-11-09 (×8): 1 g via ORAL
  Filled 2012-11-07 (×11): qty 10

## 2012-11-07 MED ORDER — TRAZODONE HCL 100 MG PO TABS
100.0000 mg | ORAL_TABLET | Freq: Every day | ORAL | Status: DC
Start: 2012-11-07 — End: 2012-11-09
  Administered 2012-11-08: 100 mg via ORAL
  Filled 2012-11-07 (×3): qty 1

## 2012-11-07 MED ORDER — ONDANSETRON HCL 4 MG/2ML IJ SOLN: 4.0000 mg | Freq: Three times a day (TID) | INTRAMUSCULAR | Status: AC | PRN

## 2012-11-07 MED ORDER — NICOTINE 14 MG/24HR TD PT24
14.0000 mg | MEDICATED_PATCH | TRANSDERMAL | Status: DC
  Administered 2012-11-07 – 2012-11-08 (×2): 14 mg via TRANSDERMAL
  Filled 2012-11-07 (×3): qty 1

## 2012-11-07 MED ORDER — LATANOPROST 0.005 % OP SOLN
1.0000 [drp] | Freq: Every day | OPHTHALMIC | Status: DC
  Administered 2012-11-07 – 2012-11-08 (×2): 1 [drp] via OPHTHALMIC
  Filled 2012-11-07: qty 2.5

## 2012-11-07 MED ORDER — DIVALPROEX SODIUM ER 500 MG PO TB24
1000.0000 mg | ORAL_TABLET | Freq: Every day | ORAL | Status: DC
  Administered 2012-11-07 – 2012-11-08 (×2): 1000 mg via ORAL
  Filled 2012-11-07 (×3): qty 2

## 2012-11-07 MED ORDER — SODIUM CHLORIDE 0.9 % IJ SOLN: 3.0000 mL | INTRAMUSCULAR | Status: DC | PRN

## 2012-11-07 MED ORDER — PANTOPRAZOLE SODIUM 40 MG PO TBEC
40.0000 mg | DELAYED_RELEASE_TABLET | Freq: Every day | ORAL | Status: DC
  Administered 2012-11-07 – 2012-11-09 (×3): 40 mg via ORAL
  Filled 2012-11-07 (×2): qty 1

## 2012-11-07 MED ORDER — BRIMONIDINE TARTRATE 0.2 % OP SOLN
1.0000 [drp] | Freq: Every day | OPHTHALMIC | Status: DC
  Administered 2012-11-07 – 2012-11-08 (×2): 1 [drp] via OPHTHALMIC
  Filled 2012-11-07: qty 5

## 2012-11-07 MED ORDER — SODIUM CHLORIDE 0.9 % IJ SOLN
3.0000 mL | Freq: Two times a day (BID) | INTRAMUSCULAR | Status: DC
  Administered 2012-11-07 – 2012-11-09 (×3): 3 mL via INTRAVENOUS

## 2012-11-07 MED ORDER — GEMFIBROZIL 600 MG PO TABS
600.0000 mg | ORAL_TABLET | Freq: Two times a day (BID) | ORAL | Status: DC
Start: 2012-11-07 — End: 2012-11-09
  Administered 2012-11-07 – 2012-11-09 (×4): 600 mg via ORAL
  Filled 2012-11-07 (×6): qty 1

## 2012-11-07 MED ORDER — LISINOPRIL 40 MG PO TABS
40.0000 mg | ORAL_TABLET | Freq: Every day | ORAL | Status: DC
  Administered 2012-11-08 – 2012-11-09 (×2): 40 mg via ORAL
  Filled 2012-11-07 (×2): qty 1

## 2012-11-07 MED ORDER — OLANZAPINE 10 MG PO TABS
10.0000 mg | ORAL_TABLET | Freq: Every day | ORAL | Status: DC
  Administered 2012-11-07 – 2012-11-08 (×2): 10 mg via ORAL
  Filled 2012-11-07 (×3): qty 1

## 2012-11-07 MED ORDER — SODIUM CHLORIDE 0.9 % IV SOLN
250.0000 mL | INTRAVENOUS | Status: DC | PRN
Start: 2012-11-07 — End: 2012-11-09

## 2012-11-07 MED ORDER — OXYCODONE-ACETAMINOPHEN 5-325 MG PO TABS
1.0000 | ORAL_TABLET | Freq: Three times a day (TID) | ORAL | Status: DC | PRN
  Administered 2012-11-07 – 2012-11-08 (×3): 1 via ORAL
  Filled 2012-11-07 (×3): qty 1

## 2012-11-07 MED ORDER — ASPIRIN EC 81 MG PO TBEC
81.0000 mg | DELAYED_RELEASE_TABLET | Freq: Every day | ORAL | Status: DC
  Administered 2012-11-08 – 2012-11-09 (×2): 81 mg via ORAL
  Filled 2012-11-07 (×2): qty 1

## 2012-11-07 MED ORDER — INSULIN ASPART 100 UNIT/ML ~~LOC~~ SOLN
0.0000 [IU] | Freq: Three times a day (TID) | SUBCUTANEOUS | Status: DC
  Administered 2012-11-07: 3 [IU] via SUBCUTANEOUS
  Administered 2012-11-08: 2 [IU] via SUBCUTANEOUS
  Administered 2012-11-08: 5 [IU] via SUBCUTANEOUS
  Administered 2012-11-08 – 2012-11-09 (×3): 2 [IU] via SUBCUTANEOUS

## 2012-11-07 MED ORDER — SODIUM CHLORIDE 0.9 % IJ SOLN
3.0000 mL | Freq: Two times a day (BID) | INTRAMUSCULAR | Status: DC
  Administered 2012-11-07 – 2012-11-08 (×2): 3 mL via INTRAVENOUS

## 2012-11-07 MED ORDER — OXYCODONE-ACETAMINOPHEN 7.5-325 MG PO TABS: 1.0000 | ORAL_TABLET | Freq: Three times a day (TID) | ORAL | Status: DC | PRN

## 2012-11-07 MED ORDER — DIAZEPAM 5 MG PO TABS
5.0000 mg | ORAL_TABLET | Freq: Every evening | ORAL | Status: DC | PRN
  Administered 2012-11-07: 5 mg via ORAL
  Filled 2012-11-07 (×2): qty 1

## 2012-11-07 MED ORDER — MORPHINE SULFATE 2 MG/ML IJ SOLN
2.0000 mg | INTRAMUSCULAR | Status: DC | PRN
  Administered 2012-11-07 – 2012-11-09 (×8): 2 mg via INTRAVENOUS
  Filled 2012-11-07 (×8): qty 1

## 2012-11-07 MED ORDER — HEPARIN SODIUM (PORCINE) 5000 UNIT/ML IJ SOLN
5000.0000 [IU] | Freq: Three times a day (TID) | INTRAMUSCULAR | Status: DC
  Administered 2012-11-07 – 2012-11-09 (×6): 5000 [IU] via SUBCUTANEOUS
  Filled 2012-11-07 (×10): qty 1

## 2012-11-07 MED ORDER — OXYCODONE-ACETAMINOPHEN 5-325 MG PO TABS
2.0000 | ORAL_TABLET | Freq: Once | ORAL | Status: AC
  Administered 2012-11-07: 2 via ORAL
  Filled 2012-11-07: qty 2

## 2012-11-07 MED ORDER — LEVOTHYROXINE SODIUM 200 MCG PO TABS
200.0000 ug | ORAL_TABLET | Freq: Every day | ORAL | Status: DC
  Administered 2012-11-08: 200 ug via ORAL
  Filled 2012-11-07 (×2): qty 1

## 2012-11-07 MED ORDER — COLCHICINE 0.6 MG PO TABS
0.6000 mg | ORAL_TABLET | Freq: Every day | ORAL | Status: DC | PRN
  Filled 2012-11-07: qty 1

## 2012-11-07 MED ORDER — BRIMONIDINE TARTRATE 0.1 % OP SOLN: 1.0000 [drp] | Freq: Every day | OPHTHALMIC | Status: DC

## 2012-11-07 MED ORDER — DICYCLOMINE HCL 20 MG PO TABS
20.0000 mg | ORAL_TABLET | Freq: Two times a day (BID) | ORAL | Status: DC
  Administered 2012-11-07 – 2012-11-09 (×4): 20 mg via ORAL
  Filled 2012-11-07 (×5): qty 1
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
## 2012-11-08 LAB — TROPONIN I: Troponin I: <0.3 ng/mL (ref <0.30)

## 2012-11-08 LAB — GLUCOSE, CAPILLARY: Glucose-Capillary: 256 mg/dL — ABNORMAL HIGH (ref 70–99)

## 2012-11-08 LAB — BASIC METABOLIC PANEL
BUN: 13 mg/dL (ref 6–23)
CO2: 24 mEq/L (ref 19–32)
Glucose, Bld: 229 mg/dL — ABNORMAL HIGH (ref 70–99)
Potassium: 4 mEq/L (ref 3.5–5.1)
Sodium: 134 mEq/L — ABNORMAL LOW (ref 135–145)

## 2012-11-08 MED ORDER — OXYCODONE HCL 5 MG PO TABS
2.5000 mg | ORAL_TABLET | Freq: Once | ORAL | Status: AC
  Administered 2012-11-08: 2.5 mg via ORAL

## 2012-11-08 MED ORDER — LEVOTHYROXINE SODIUM 125 MCG PO TABS
250.0000 ug | ORAL_TABLET | Freq: Every day | ORAL | Status: DC
  Administered 2012-11-09: 250 ug via ORAL
  Filled 2012-11-08 (×2): qty 2

## 2012-11-08 NOTE — Progress Notes (Signed)
Subjective: Pt still complaining of some chest pressure and dizziness. But is able to ambulate around the room with no worsening pressure or SOB.   Objective: Vital signs in last 24 hours: Filed Vitals:   11/07/12 1700 11/07/12 2029 11/07/12 2153 11/08/12 0536  BP: 148/72  155/73 151/78  Pulse: 69  61 68  Temp:   97.4 F (36.3 C) 97.8 F (36.6 C)  TempSrc:   Oral Oral  Resp:      Height:      Weight:    209 lb 9.6 oz (95.074 kg)  SpO2:  96% 95% 90%   Weight change:   Intake/Output Summary (Last 24 hours) at 11/08/12 1059 Last data filed at 11/08/12 0900  Gross per 24 hour  Intake    720 ml  Output      0 ml  Net    720 ml   General: resting in bed, NAD HEENT: PERRL, EOMI, no scleral icterus Cardiac: RRR, no rubs, murmurs or gallops Pulm: clear to auscultation bilaterally, moving normal volumes of air Abd: soft, nontender, nondistended, BS present Ext: warm and well perfused, no pedal edema Neuro: alert and oriented X3, cranial nerves II-XII grossly intact  Lab Results: Basic Metabolic Panel:  Recent Labs Lab 11/07/12 0930 11/08/12 0045  NA 136 134*  K 3.7 4.0  CL 100 99  CO2 24 24  GLUCOSE 184* 229*  BUN 11 13  CREATININE 1.15* 1.20*  CALCIUM 9.1 9.1   Liver Function Tests:  Recent Labs Lab 11/07/12 0930  AST 24  ALT 15  ALKPHOS 122*  BILITOT 0.2*  PROT 7.3  ALBUMIN 3.7   No results found for this basename: LIPASE, AMYLASE,  in the last 168 hours No results found for this basename: AMMONIA,  in the last 168 hours CBC:  Recent Labs Lab 11/07/12 0930  WBC 6.0  NEUTROABS 3.3  HGB 12.7  HCT 38.0  MCV 84.8  PLT 287   Cardiac Enzymes:  Recent Labs Lab 11/07/12 1356 11/07/12 1907 11/08/12 0045  TROPONINI <0.30 <0.30 <0.30   BNP: No results found for this basename: PROBNP,  in the last 168 hours D-Dimer:  Recent Labs Lab 11/07/12 0930  DDIMER 0.39   CBG:  Recent Labs Lab 11/07/12 1654 11/07/12 2155 11/08/12 0713  GLUCAP  212* 156* 162*   Hemoglobin A1C:  Recent Labs Lab 11/07/12 1355  HGBA1C 8.8*   Fasting Lipid Panel: No results found for this basename: CHOL, HDL, LDLCALC, TRIG, CHOLHDL, LDLDIRECT,  in the last 168 hours Thyroid Function Tests:  Recent Labs Lab 11/07/12 1355  TSH 62.994*  FREET4 0.77*   Coagulation:  Recent Labs Lab 11/07/12 1355  LABPROT 12.3  INR 0.92   Urine Drug Screen: Drugs of Abuse     Component Value Date/Time   LABOPIA NONE DETECTED 11/07/2012 1020   COCAINSCRNUR NONE DETECTED 11/07/2012 1020   LABBENZ NONE DETECTED 11/07/2012 1020   AMPHETMU NONE DETECTED 11/07/2012 1020   THCU NONE DETECTED 11/07/2012 1020   LABBARB NONE DETECTED 11/07/2012 1020    Alcohol Level: No results found for this basename: ETH,  in the last 168 hours Urinalysis:  Recent Labs Lab 11/07/12 1020  COLORURINE YELLOW  LABSPEC 1.012  PHURINE 5.5  GLUCOSEU NEGATIVE  HGBUR NEGATIVE  BILIRUBINUR NEGATIVE  KETONESUR NEGATIVE  PROTEINUR NEGATIVE  UROBILINOGEN 0.2  NITRITE NEGATIVE  LEUKOCYTESUR TRACE*   Micro Results: No results found for this or any previous visit (from the past 240 hour(s)). Studies/Results:  Dg Chest 2 View  11/07/2012   *RADIOLOGY REPORT*  Clinical Data: 57 year old female chest pain shortness of breath diabetes hypertension.  CHEST - 2 VIEW  Comparison: 07/03/2012 and earlier.  Findings: Mildly lower lung volumes.  Stable cardiac size and mediastinal contours.  Cardiac size at the upper limits of normal. Visualized tracheal air column is within normal limits.  No pneumothorax, pulmonary edema, pleural effusion or confluent pulmonary opacity. No acute osseous abnormality identified.  IMPRESSION: No acute cardiopulmonary abnormality.   Original Report Authenticated By: Erskine Speed, M.D.   Dg Lumbar Spine Complete  11/07/2012   *RADIOLOGY REPORT*  Clinical Data: 57 year old female with low back pain.  LUMBAR SPINE - COMPLETE 4+ VIEW  Comparison: 10/29/2012 and  earlier.  Findings: Normal lumbar segmentation.  Stable and relatively preserved disc spaces.  Vertebral height and alignment stable and within normal limits.  Mild sclerosis of the L4 superior endplate again noted.  No lumbar compression.  Possible mild compression of the T11 level, but stable since 07/24/2012 along with endplate osteophytes at that level.  Other visible lower thoracic levels appear intact.  No pars fracture.  Sacral ala and SI joints stable and within normal limits.  IMPRESSION: 1. No acute osseous abnormality in the lumbar spine. 2. Subtle/mild compression of the T11 vertebral body, but stable since the CT on 07/24/2012.   Original Report Authenticated By: Erskine Speed, M.D.   Medications: I have reviewed the patient's current medications. Scheduled Meds: . allopurinol  300 mg Oral Daily  . aspirin EC  81 mg Oral Daily  . atorvastatin  80 mg Oral q1800  . brimonidine  1 drop Both Eyes QHS  . dicyclomine  20 mg Oral BID  . divalproex  1,000 mg Oral QHS  . fluticasone  1 puff Inhalation BID  . gemfibrozil  600 mg Oral BID AC  . heparin  5,000 Units Subcutaneous Q8H  . insulin aspart  0-9 Units Subcutaneous TID WC  . latanoprost  1 drop Both Eyes QHS  . levothyroxine  200 mcg Oral QAC breakfast  . lisinopril  40 mg Oral Daily  . nicotine  14 mg Transdermal Q24H  . OLANZapine  10 mg Oral QHS  . pantoprazole  40 mg Oral Daily  . sertraline  50 mg Oral Daily  . sodium chloride  3 mL Intravenous Q12H  . sodium chloride  3 mL Intravenous Q12H  . sucralfate  1 g Oral QID  . traZODone  100 mg Oral QHS   Continuous Infusions:  PRN Meds:.sodium chloride, colchicine, diazepam, morphine injection, nitroGLYCERIN, oxyCODONE, oxyCODONE-acetaminophen, sodium chloride Assessment/Plan: #Chest Pain: Pt has atypical CP symptoms. Initial trop negative and non-specific T wave flattening on EKG. Revised geneva 0 and D-dimer negative making PE very unlikely. CXR no acute cardiopulmonary process  therefore PNA less likely and pt doesn't meet SIRS criteria at this time. Pt has hx of cocaine and polysubstance abuse UDS neg this admission. Last Echo 2010 EF 55% and mild LVH w/o any WMA. No stress test on record at this time. CMet doesn't indicate obstructive pattern such as pancreatitis. ACS ruled out with negative trops. AM EKG with similar findings as admission EKG.  - risk stratification: HgbA1c, FLP, TSH  -statin  -protonix  -cardiology consult for ongoing chest pressure  # Hypothyroidism: pt on synthroid 230mcg/qd as outpt. TSH on admission 62.9 and Free T4 0.77 indiciative of primary hypothyroidism. Pt reports compliance.  -increased to 250 mcg  -recheck TSH and T4 outpt  in 6 wks   #Trichamonas: UA found trichomonas on micro.  -flagyl 2g once on 6/13   Dispo: Disposition is deferred at this time, awaiting improvement of current medical problems.  Anticipated discharge in approximately 1-2 day(s).   The patient does have a current PCP (Pcp Not In System) and does need an [facility] hospital follow-up appointment after discharge.  The patient does not have transportation limitations that hinder transportation to clinic appointments.  .Services Needed at time of discharge: Y = Yes, Blank = No PT:   OT:   RN:   Equipment:   Other:     LOS: 1 day   Christen Bame, MD 11/08/2012, 10:59 AM Pgr: [phone]

## 2012-11-08 NOTE — Progress Notes (Signed)
UR Completed Analeah Brame Graves-Bigelow, RN,BSN [phone]  
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
## 2012-11-09 ENCOUNTER — Observation Stay (HOSPITAL_COMMUNITY): Payer: PRIVATE HEALTH INSURANCE

## 2012-11-09 ENCOUNTER — Other Ambulatory Visit: Payer: Self-pay

## 2012-11-09 DIAGNOSIS — R079 Chest pain, unspecified: Secondary | ICD-10-CM

## 2012-11-09 LAB — GLUCOSE, CAPILLARY

## 2012-11-09 MED ORDER — TECHNETIUM TC 99M SESTAMIBI GENERIC - CARDIOLITE
30.0000 | Freq: Once | INTRAVENOUS | Status: AC | PRN
  Administered 2012-11-09: 30 via INTRAVENOUS

## 2012-11-09 MED ORDER — ATORVASTATIN CALCIUM 80 MG PO TABS: 80.0000 mg | ORAL_TABLET | Freq: Every day | ORAL | Status: DC

## 2012-11-09 MED ORDER — OXYCODONE-ACETAMINOPHEN 7.5-325 MG PO TABS: 1.0000 | ORAL_TABLET | Freq: Three times a day (TID) | ORAL | Status: DC | PRN

## 2012-11-09 MED ORDER — NITROGLYCERIN 0.4 MG SL SUBL: 0.4000 mg | SUBLINGUAL_TABLET | SUBLINGUAL | Status: AC | PRN

## 2012-11-09 MED ORDER — TECHNETIUM TC 99M SESTAMIBI GENERIC - CARDIOLITE
10.0000 | Freq: Once | INTRAVENOUS | Status: AC | PRN
  Administered 2012-11-09: 10 via INTRAVENOUS

## 2012-11-09 MED ORDER — REGADENOSON 0.4 MG/5ML IV SOLN
0.4000 mg | Freq: Once | INTRAVENOUS | Status: AC
  Administered 2012-11-09: 0.4 mg via INTRAVENOUS

## 2012-11-09 MED ORDER — LEVOTHYROXINE SODIUM 125 MCG PO TABS: 250.0000 ug | ORAL_TABLET | Freq: Every day | ORAL | Status: DC

## 2012-11-09 NOTE — Progress Notes (Signed)
Subjective:  - Pt still complaining of some chest pressure which remains similar with yesterday.  - her chronic pain was worse last night due to discontinuation of her percocet on admission, which was restarted. Now her pain is better. - No fever or chill.  Objective: Vital signs in last 24 hours: Filed Vitals:   11/08/12 1721 11/08/12 1949 11/08/12 2125 11/09/12 0546  BP: 153/84  134/58 127/72  Pulse: 78  87 82  Temp:   98 F (36.7 C) 98.2 F (36.8 C)  TempSrc:   Oral Oral  Resp:   18 18  Height:      Weight:    206 lb 9.1 oz (93.7 kg)  SpO2: 98% 97% 95% 95%   Weight change: 6 lb 6.3 oz (2.9 kg)  Intake/Output Summary (Last 24 hours) at 11/09/12 0946 Last data filed at 11/09/12 0750  Gross per 24 hour  Intake    600 ml  Output      0 ml  Net    600 ml   General: resting in bed, NAD HEENT: PERRL, EOMI, no scleral icterus Cardiac: RRR, no rubs, murmurs or gallops Pulm: clear to auscultation bilaterally, moving normal volumes of air Abd: soft, nontender, nondistended, BS present Ext: warm and well perfused, no pedal edema Neuro: alert and oriented X3, cranial nerves II-XII grossly intact  Lab Results: Basic Metabolic Panel:  Recent Labs Lab 11/07/12 0930 11/08/12 0045  NA 136 134*  K 3.7 4.0  CL 100 99  CO2 24 24  GLUCOSE 184* 229*  BUN 11 13  CREATININE 1.15* 1.20*  CALCIUM 9.1 9.1   Liver Function Tests:  Recent Labs Lab 11/07/12 0930  AST 24  ALT 15  ALKPHOS 122*  BILITOT 0.2*  PROT 7.3  ALBUMIN 3.7   No results found for this basename: LIPASE, AMYLASE,  in the last 168 hours No results found for this basename: AMMONIA,  in the last 168 hours CBC:  Recent Labs Lab 11/07/12 0930  WBC 6.0  NEUTROABS 3.3  HGB 12.7  HCT 38.0  MCV 84.8  PLT 287   Cardiac Enzymes:  Recent Labs Lab 11/07/12 1356 11/07/12 1907 11/08/12 0045  TROPONINI <0.30 <0.30 <0.30   BNP: No results found for this basename: PROBNP,  in the last 168  hours D-Dimer:  Recent Labs Lab 11/07/12 0930  DDIMER 0.39   CBG:  Recent Labs Lab 11/07/12 2155 11/08/12 0713 11/08/12 1142 11/08/12 1713 11/08/12 2053 11/09/12 0747  GLUCAP 156* 162* 174* 256* 211* 191*   Hemoglobin A1C:  Recent Labs Lab 11/07/12 1355  HGBA1C 8.8*   Fasting Lipid Panel: No results found for this basename: CHOL, HDL, LDLCALC, TRIG, CHOLHDL, LDLDIRECT,  in the last 168 hours Thyroid Function Tests:  Recent Labs Lab 11/07/12 1355  TSH 62.994*  FREET4 0.77*   Coagulation:  Recent Labs Lab 11/07/12 1355  LABPROT 12.3  INR 0.92   Urine Drug Screen: Drugs of Abuse     Component Value Date/Time   LABOPIA NONE DETECTED 11/07/2012 1020   COCAINSCRNUR NONE DETECTED 11/07/2012 1020   LABBENZ NONE DETECTED 11/07/2012 1020   AMPHETMU NONE DETECTED 11/07/2012 1020   THCU NONE DETECTED 11/07/2012 1020   LABBARB NONE DETECTED 11/07/2012 1020    Alcohol Level: No results found for this basename: ETH,  in the last 168 hours Urinalysis:  Recent Labs Lab 11/07/12 1020  COLORURINE YELLOW  LABSPEC 1.012  PHURINE 5.5  GLUCOSEU NEGATIVE  HGBUR NEGATIVE  BILIRUBINUR NEGATIVE  KETONESUR NEGATIVE  PROTEINUR NEGATIVE  UROBILINOGEN 0.2  NITRITE NEGATIVE  LEUKOCYTESUR TRACE*   Micro Results: No results found for this or any previous visit (from the past 240 hour(s)). Studies/Results: Dg Chest 2 View  11/07/2012   *RADIOLOGY REPORT*  Clinical Data: 57 year old female chest pain shortness of breath diabetes hypertension.  CHEST - 2 VIEW  Comparison: 07/03/2012 and earlier.  Findings: Mildly lower lung volumes.  Stable cardiac size and mediastinal contours.  Cardiac size at the upper limits of normal. Visualized tracheal air column is within normal limits.  No pneumothorax, pulmonary edema, pleural effusion or confluent pulmonary opacity. No acute osseous abnormality identified.  IMPRESSION: No acute cardiopulmonary abnormality.   Original Report  Authenticated By: Erskine Speed, M.D.   Dg Lumbar Spine Complete  11/07/2012   *RADIOLOGY REPORT*  Clinical Data: 57 year old female with low back pain.  LUMBAR SPINE - COMPLETE 4+ VIEW  Comparison: 10/29/2012 and earlier.  Findings: Normal lumbar segmentation.  Stable and relatively preserved disc spaces.  Vertebral height and alignment stable and within normal limits.  Mild sclerosis of the L4 superior endplate again noted.  No lumbar compression.  Possible mild compression of the T11 level, but stable since 07/24/2012 along with endplate osteophytes at that level.  Other visible lower thoracic levels appear intact.  No pars fracture.  Sacral ala and SI joints stable and within normal limits.  IMPRESSION: 1. No acute osseous abnormality in the lumbar spine. 2. Subtle/mild compression of the T11 vertebral body, but stable since the CT on 07/24/2012.   Original Report Authenticated By: Erskine Speed, M.D.   Medications: I have reviewed the patient's current medications. Scheduled Meds: . allopurinol  300 mg Oral Daily  . aspirin EC  81 mg Oral Daily  . atorvastatin  80 mg Oral q1800  . brimonidine  1 drop Both Eyes QHS  . dicyclomine  20 mg Oral BID  . divalproex  1,000 mg Oral QHS  . fluticasone  1 puff Inhalation BID  . gemfibrozil  600 mg Oral BID AC  . heparin  5,000 Units Subcutaneous Q8H  . insulin aspart  0-9 Units Subcutaneous TID WC  . latanoprost  1 drop Both Eyes QHS  . levothyroxine  250 mcg Oral QAC breakfast  . lisinopril  40 mg Oral Daily  . nicotine  14 mg Transdermal Q24H  . OLANZapine  10 mg Oral QHS  . pantoprazole  40 mg Oral Daily  . sertraline  50 mg Oral Daily  . sodium chloride  3 mL Intravenous Q12H  . sodium chloride  3 mL Intravenous Q12H  . sucralfate  1 g Oral QID  . traZODone  100 mg Oral QHS   Continuous Infusions:  PRN Meds:.sodium chloride, colchicine, diazepam, morphine injection, nitroGLYCERIN, oxyCODONE, oxyCODONE-acetaminophen, sodium  chloride Assessment/Plan:  #Chest Pain: Pt has atypical CP symptoms. Initial trop negative and non-specific T wave flattening on EKG. Revised geneva 0 and D-dimer negative making PE very unlikely. CXR no acute cardiopulmonary process therefore PNA less likely and pt doesn't meet SIRS criteria at this time. Pt has hx of cocaine and polysubstance abuse. UDS neg this admission. Last Echo 2010 EF 55% and mild LVH w/o any WMA. No stress test on record at this time.  ACS ruled out with negative trops. AM EKG with similar findings as admission EKG. LDL 160 on 07/19/12, A1c 8.8 on 11/07/12. Patient still has some chest pressure and mild chest pain. I spoke with cardiologist, Dr. Shirlee Latch on phone  6/14, who suggested to get Myoview test.   -pending Myoview. If normal, will d/c home and follow up in clinic.  - continue statin, NG prn, morphin prn.  - did not start BB initially due to bradycardia. Her HR is better today. If continue to be normal, will start BB at low dose. - continue Protonix   # Hypothyroidism: pt on synthroid 213mcg/qd as outpt. TSH on admission 62.9 and Free T4 0.77 indiciative of primary hypothyroidism. Pt reports compliance.  -increased to 250 mcg  -recheck TSH and T4 outpt in 6 wks   #Trichamonas: UA found trichomonas on micro.  -flagyl 2g once on 6/13   Dispo: Disposition is deferred at this time, awaiting improvement of current medical problems.  Anticipated discharge in approximately 1-2 day(s).   The patient does have a current PCP (Pcp Not In System) and does need an [facility] hospital follow-up appointment after discharge.  The patient does not have transportation limitations that hinder transportation to clinic appointments.  .Services Needed at time of discharge: Y = Yes, Blank = No PT:   OT:   RN:   Equipment:   Other:     LOS: 2 days   Lorretta Harp, MD 11/09/2012, 9:46 AM Pgr: [phone]
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
## 2012-11-17 ENCOUNTER — Encounter (HOSPITAL_COMMUNITY): Payer: Self-pay | Admitting: *Deleted

## 2012-11-17 ENCOUNTER — Emergency Department (HOSPITAL_COMMUNITY)
Admission: EM | Admit: 2012-11-17 | Discharge: 2012-11-17 | Disposition: A | Discharge status: Home / Self Care | Payer: PRIVATE HEALTH INSURANCE | Attending: Emergency Medicine | Admitting: Emergency Medicine

## 2012-11-17 DIAGNOSIS — Z8739 Personal history of other diseases of the musculoskeletal system and connective tissue: Secondary | ICD-10-CM | POA: Insufficient documentation

## 2012-11-17 DIAGNOSIS — J45909 Unspecified asthma, uncomplicated: Secondary | ICD-10-CM | POA: Insufficient documentation

## 2012-11-17 DIAGNOSIS — Y92009 Unspecified place in unspecified non-institutional (private) residence as the place of occurrence of the external cause: Secondary | ICD-10-CM | POA: Insufficient documentation

## 2012-11-17 DIAGNOSIS — F17200 Nicotine dependence, unspecified, uncomplicated: Secondary | ICD-10-CM | POA: Insufficient documentation

## 2012-11-17 DIAGNOSIS — Z79899 Other long term (current) drug therapy: Secondary | ICD-10-CM | POA: Insufficient documentation

## 2012-11-17 DIAGNOSIS — F3289 Other specified depressive episodes: Secondary | ICD-10-CM | POA: Insufficient documentation

## 2012-11-17 DIAGNOSIS — M109 Gout, unspecified: Secondary | ICD-10-CM | POA: Insufficient documentation

## 2012-11-17 DIAGNOSIS — Z8669 Personal history of other diseases of the nervous system and sense organs: Secondary | ICD-10-CM | POA: Insufficient documentation

## 2012-11-17 DIAGNOSIS — E119 Type 2 diabetes mellitus without complications: Secondary | ICD-10-CM | POA: Insufficient documentation

## 2012-11-17 DIAGNOSIS — W108XXA Fall (on) (from) other stairs and steps, initial encounter: Secondary | ICD-10-CM | POA: Insufficient documentation

## 2012-11-17 DIAGNOSIS — R109 Unspecified abdominal pain: Secondary | ICD-10-CM | POA: Insufficient documentation

## 2012-11-17 DIAGNOSIS — E785 Hyperlipidemia, unspecified: Secondary | ICD-10-CM | POA: Insufficient documentation

## 2012-11-17 DIAGNOSIS — I10 Essential (primary) hypertension: Secondary | ICD-10-CM | POA: Insufficient documentation

## 2012-11-17 DIAGNOSIS — Z8719 Personal history of other diseases of the digestive system: Secondary | ICD-10-CM | POA: Insufficient documentation

## 2012-11-17 DIAGNOSIS — Y9301 Activity, walking, marching and hiking: Secondary | ICD-10-CM | POA: Insufficient documentation

## 2012-11-17 DIAGNOSIS — R42 Dizziness and giddiness: Secondary | ICD-10-CM | POA: Insufficient documentation

## 2012-11-17 DIAGNOSIS — E0590 Thyrotoxicosis, unspecified without thyrotoxic crisis or storm: Secondary | ICD-10-CM | POA: Insufficient documentation

## 2012-11-17 DIAGNOSIS — F329 Major depressive disorder, single episode, unspecified: Secondary | ICD-10-CM | POA: Insufficient documentation

## 2012-11-17 DIAGNOSIS — IMO0002 Reserved for concepts with insufficient information to code with codable children: Secondary | ICD-10-CM | POA: Insufficient documentation

## 2012-11-17 DIAGNOSIS — Z8673 Personal history of transient ischemic attack (TIA), and cerebral infarction without residual deficits: Secondary | ICD-10-CM | POA: Insufficient documentation

## 2012-11-17 DIAGNOSIS — Z7982 Long term (current) use of aspirin: Secondary | ICD-10-CM | POA: Insufficient documentation

## 2012-11-17 DIAGNOSIS — Z87448 Personal history of other diseases of urinary system: Secondary | ICD-10-CM | POA: Insufficient documentation

## 2012-11-17 DIAGNOSIS — G8929 Other chronic pain: Secondary | ICD-10-CM | POA: Insufficient documentation

## 2012-11-17 DIAGNOSIS — W19XXXA Unspecified fall, initial encounter: Secondary | ICD-10-CM

## 2012-11-17 HISTORY — DX: Other chronic pain: G89.29

## 2012-11-17 LAB — CBC WITH DIFFERENTIAL/PLATELET
Basophils Absolute: 0 10*3/uL (ref 0.0–0.1)
Eosinophils Relative: 1 % (ref 0–5)
Lymphocytes Relative: 27 % (ref 12–46)
Lymphs Abs: 1.7 10*3/uL (ref 0.7–4.0)
MCV: 84.7 fL (ref 78.0–100.0)
Neutro Abs: 4.1 10*3/uL (ref 1.7–7.7)
Neutrophils Relative %: 64 % (ref 43–77)
Platelets: 269 10*3/uL (ref 150–400)
RBC: 4.39 MIL/uL (ref 3.87–5.11)
RDW: 15.5 % (ref 11.5–15.5)
WBC: 6.3 10*3/uL (ref 4.0–10.5)

## 2012-11-17 LAB — COMPREHENSIVE METABOLIC PANEL
AST: 15 U/L (ref 0–37)
CO2: 25 mEq/L (ref 19–32)
Calcium: 9.1 mg/dL (ref 8.4–10.5)
Creatinine, Ser: 1.12 mg/dL — ABNORMAL HIGH (ref 0.50–1.10)
GFR calc Af Amer: 63 mL/min — ABNORMAL LOW (ref >90)
GFR calc non Af Amer: 55 mL/min — ABNORMAL LOW (ref >90)
Glucose, Bld: 288 mg/dL — ABNORMAL HIGH (ref 70–99)
Total Protein: 7.3 g/dL (ref 6.0–8.3)

## 2012-11-17 LAB — URINALYSIS, ROUTINE W REFLEX MICROSCOPIC
Nitrite: NEGATIVE
Specific Gravity, Urine: 1.022 (ref 1.005–1.030)
Urobilinogen, UA: 0.2 mg/dL (ref 0.0–1.0)

## 2012-11-17 LAB — LIPASE, BLOOD: Lipase: 103 U/L — ABNORMAL HIGH (ref 11–59)

## 2012-11-17 MED ORDER — SODIUM CHLORIDE 0.9 % IV BOLUS (SEPSIS)
1000.0000 mL | Freq: Once | INTRAVENOUS | Status: AC
  Administered 2012-11-17: 1000 mL via INTRAVENOUS

## 2012-11-17 MED ORDER — PANTOPRAZOLE SODIUM 40 MG PO TBEC
40.0000 mg | DELAYED_RELEASE_TABLET | Freq: Once | ORAL | Status: AC
  Administered 2012-11-17: 40 mg via ORAL
  Filled 2012-11-17: qty 1

## 2012-11-17 MED ORDER — OXYCODONE-ACETAMINOPHEN 5-325 MG PO TABS
2.0000 | ORAL_TABLET | Freq: Once | ORAL | Status: AC
  Administered 2012-11-17: 2 via ORAL
  Filled 2012-11-17: qty 2

## 2012-11-17 MED ORDER — IBUPROFEN 800 MG PO TABS
800.0000 mg | ORAL_TABLET | Freq: Once | ORAL | Status: AC
  Administered 2012-11-17: 800 mg via ORAL
  Filled 2012-11-17: qty 1

## 2012-11-17 NOTE — ED Provider Notes (Signed)
Medical screening examination/treatment/procedure(s) were performed by non-physician practitioner and as supervising physician I was immediately available for consultation/collaboration.    Alina Gilkey D Cesario Weidinger, MD 11/17/12 2059 

## 2012-11-17 NOTE — ED Notes (Signed)
Tolerated fluids well.

## 2012-11-17 NOTE — ED Notes (Addendum)
No visible redness, edema, bruising noted to head or back. States was discharged 6-15 for pancreatitis & has an appt 7-1 with internal medicine. Pt states will run out of pain med (percocet) prior to appt & requesting more until seen. Reports was going to a pain clinic in [facility] but stopped a few months ago

## 2012-11-17 NOTE — ED Provider Notes (Signed)
History     CSN: [id number]  Arrival date & time 11/17/12  7829   First MD Initiated Contact with Patient 11/17/12 0745      Chief Complaint  Patient presents with  . Fall  . Back Pain    (Consider location/radiation/quality/duration/timing/severity/associated sxs/prior treatment) HPI Comments: Patient is a 57 year old female with a history of hypertension, hyperlipidemia, TIA, and diabetes who presents after a mechanical fall at home this morning. Patient states that she was walking down the stairs when she felt "woozy" and lightheaded; denies dizziness. She subsequently lost her footing falling backwards onto her back and down 4 stairs. Patient states that she bumped her head on the sidewall, but denies losing consciousness. Patient is admitting to an aching pain in her L parietal and occipital region that is constant without aggravating or alleviating factors; patient denies taking anything at home for pain. Patient denies vision changes or vision loss, tinnitus, hearing loss, difficulty speaking or swallowing, N/V, numbness or tingling in her extremities, extremity weakness, or an inability to ambulate. Patient with secondary complaint of abdominal pain which she states is a chronic issue for her 2/2 "pancreatitis"; no documented hx of chronic pancreatitis that I can find. Pain is in the epigastric region and worse with palpation. Last BM was yesterday which was normal in color and consistency; no melena or hematochezia. Patient denies fevers.   Patient has hx of substance abuse and chronic pain; chronic pain currently being managed by [facility] clinic. Patient endorses established appt with an IM physician on 11/25/12.  Patient is a 57 y.o. female presenting with fall and back pain. The history is provided by the patient. No language interpreter was used.  Fall Associated symptoms include abdominal pain and headaches. Pertinent negatives include no chest pain, fever, nausea, numbness, vomiting  or weakness.  Back Pain Associated symptoms: abdominal pain and headaches   Associated symptoms: no chest pain, no fever, no numbness and no weakness     Past Medical History  Diagnosis Date  . Asthma     past 10 years  . Arthritis     djd -lower back  . Diabetes mellitus     past 5 years  . Glaucoma     dx 06/2011-[facility]  . Hypertension   . Hyperlipidemia   . Hyperthyroidism   . History of TIA (transient ischemic attack)     01/06/2011  . Pancreatitis   . Degenerative disc disease   . Gout   . Depression   . Renal disorder     chronic kidney disease, stage 3  . Chronic pain   . Degenerative disc disease     Past Surgical History  Procedure Laterality Date  . Vaginal hysterectomy  1990    Family History  Problem Relation Age of Onset  . Arthritis Mother   . Heart disease      mother & father  . Hypertension      mother & father  . Colon cancer Neg Hx   . Diabetes Mother   . Prostate cancer Neg Hx   . Breast cancer Neg Hx     History  Substance Use Topics  . Smoking status: Current Every Day Smoker -- 1.00 packs/day for 30 years    Types: Cigarettes  . Smokeless tobacco: Never Used  . Alcohol Use: No    OB History   Grav Para Term Preterm Abortions TAB SAB Ect Mult Living  Review of Systems  Constitutional: Negative for fever.  HENT: Negative for hearing loss, trouble swallowing and tinnitus.   Eyes: Negative for visual disturbance.  Respiratory: Negative for shortness of breath.   Cardiovascular: Negative for chest pain.  Gastrointestinal: Positive for abdominal pain. Negative for nausea, vomiting, diarrhea and blood in stool.  Musculoskeletal: Positive for back pain.  Neurological: Positive for light-headedness and headaches. Negative for dizziness, speech difficulty, weakness and numbness.  All other systems reviewed and are negative.    Allergies  Metformin and related and Vicodin  Home Medications    Current Outpatient Rx  Name  Route  Sig  Dispense  Refill  . nicotine (NICODERM CQ - DOSED IN MG/24 HOURS) 14 mg/24hr patch   Transdermal   Place 1 patch onto the skin daily.         Marland Kitchen oxyCODONE-acetaminophen (PERCOCET) 7.5-325 MG per tablet   Oral   Take 1 tablet by mouth every 8 (eight) hours as needed for pain.         Marland Kitchen allopurinol (ZYLOPRIM) 300 MG tablet   Oral   Take 300 mg by mouth daily.         Marland Kitchen aspirin EC 81 MG tablet   Oral   Take 81 mg by mouth daily.         Marland Kitchen atorvastatin (LIPITOR) 80 MG tablet   Oral   Take 80 mg by mouth daily at 6 PM.         . brimonidine (ALPHAGAN P) 0.1 % SOLN   Both Eyes   Place 1 drop into both eyes at bedtime.         . colchicine 0.6 MG tablet   Oral   Take 0.6 mg by mouth daily as needed (for gout flare-ups).          . diazepam (VALIUM) 5 MG tablet   Oral   Take 5 mg by mouth at bedtime as needed for sleep.          . divalproex (DEPAKOTE ER) 500 MG 24 hr tablet   Oral   Take 1,000 mg by mouth at bedtime.         . fluticasone (FLOVENT HFA) 44 MCG/ACT inhaler   Inhalation   Inhale 2 puffs into the lungs 2 (two) times daily.          Marland Kitchen gemfibrozil (LOPID) 600 MG tablet   Oral   Take 600 mg by mouth 2 (two) times daily before a meal.         . glipiZIDE (GLUCOTROL) 10 MG tablet   Oral   Take 10 mg by mouth daily.         Marland Kitchen HYDROmorphone (DILAUDID) 4 MG tablet   Oral   Take 4 mg by mouth every 4 (four) hours as needed for pain.         Marland Kitchen latanoprost (XALATAN) 0.005 % ophthalmic solution   Both Eyes   Place 1 drop into both eyes at bedtime.         Marland Kitchen levothyroxine (SYNTHROID, LEVOTHROID) 125 MCG tablet   Oral   Take 250 mcg by mouth daily before breakfast.         . lisinopril (PRINIVIL,ZESTRIL) 40 MG tablet   Oral   Take 40 mg by mouth daily.         . nitroGLYCERIN (NITROSTAT) 0.4 MG SL tablet   Sublingual   Place 1 tablet (0.4 mg total) under the tongue every 5 (five)  minutes as needed for chest pain.   30 tablet   3   . OLANZapine (ZYPREXA) 10 MG tablet   Oral   Take 10 mg by mouth at bedtime.         . pantoprazole (PROTONIX) 40 MG tablet   Oral   Take 40 mg by mouth daily.         . sertraline (ZOLOFT) 25 MG tablet   Oral   Take 50 mg by mouth daily.         . traZODone (DESYREL) 100 MG tablet   Oral   Take 100 mg by mouth at bedtime.           BP 148/86  Pulse 75  Temp(Src) 98.1 F (36.7 C) (Oral)  Resp 17  Ht 5\' 2"  (1.575 m)  Wt 200 lb (90.719 kg)  BMI 36.57 kg/m2  SpO2 97%  Physical Exam  Nursing note and vitals reviewed. Constitutional: She is oriented to person, place, and time. She appears well-developed and well-nourished. No distress.  HENT:  Head: Normocephalic and atraumatic.  Mouth/Throat: Oropharynx is clear and moist. No oropharyngeal exudate.  Symmetric rise of the uvula with phonation  Eyes: Conjunctivae and EOM are normal. Pupils are equal, round, and reactive to light. No scleral icterus.  Neck: Normal range of motion. Neck supple.  Cardiovascular: Normal rate, regular rhythm, normal heart sounds and intact distal pulses.   Distal radial, dorsalis pedis, and posterior tibial pulses 2+ bilaterally  Pulmonary/Chest: Effort normal and breath sounds normal. No respiratory distress. She has no wheezes. She has no rales.  Abdominal: Soft. She exhibits no mass. There is tenderness (mild epigastric). There is no rebound and no guarding.  Obese abdomen. No peritoneal signs.  Musculoskeletal:  Mild TTP of lumbar midline and paraspinal muscles. No bony step offs or deformities palpated. No evidence of fall or trauma to skin of back; no ecchymosis, abrasions, or contusions.  Lymphadenopathy:    She has no cervical adenopathy.  Neurological: She is alert and oriented to person, place, and time. She has normal reflexes. She displays normal reflexes. No cranial nerve deficit. She exhibits normal muscle tone.  Patient  speaks in full goal oriented sentences. Cranial nerves III through XII grossly intact. Patient is equal grip strength bilaterally with 5 out of 5 strength against resistance in her upper and lower extremities. DTRs normal and symmetric. No sensory or motor deficits appreciated; moves extremities without ataxia.  Skin: Skin is warm and dry. No rash noted. She is not diaphoretic. No erythema. No pallor.  No ecchymosis or evidence of fall/trauma  Psychiatric: She has a normal mood and affect. Her behavior is normal. Thought content normal.    ED Course  Procedures (including critical care time)  Labs Reviewed  LIPASE, BLOOD - Abnormal; Notable for the following:    Lipase 103 (*)    All other components within normal limits  COMPREHENSIVE METABOLIC PANEL - Abnormal; Notable for the following:    Glucose, Bld 288 (*)    Creatinine, Ser 1.12 (*)    Alkaline Phosphatase 126 (*)    Total Bilirubin 0.2 (*)    GFR calc non Af Amer 55 (*)    GFR calc Af Amer 63 (*)    All other components within normal limits  URINALYSIS, ROUTINE W REFLEX MICROSCOPIC - Abnormal; Notable for the following:    Glucose, UA 250 (*)    All other components within normal limits  CBC WITH DIFFERENTIAL   No  results found.   1. Chronic abdominal pain   2. Fall at home, initial encounter     MDM  Patient presents after mechanical fall at home down 4 steps causing her to bump her head on the wall; no LOC. Also with c/o abdominal pain which patient admits to be chronic. Physical exam significant for generalized, mild abdominal tenderness to palpation without peritoneal signs or guarding. Patient is neurovascularly intact with no focal neurologic deficits on neurologic exam. Very low suspicion of head injury given lack of concussive symptoms and reassuring physical exam. Patient concerned for pancreatitis; well obtained CBC, CMP, lipase, and UA for further evaluation. Ibuprofen and Protonix ordered for symptoms as well as  IVF.  Patient's labs without evidence of leukocytosis or anemia. There is no electrolyte imbalance and liver and kidney function preserved; Cr of 1.12 and rest of labs c/w prior work ups. Patient continues to be well and nontoxic appearing. No abdominal tenderness on reexamination. Do not believe further workup imaging is warranted at this time. Patient ambulatory without assistance and tolerating fluids by mouth. She is requesting prescriptions for narcotics to manage her pain at home. Patient currently being managed by pain management clinic. I have discussed with patient that is against hospital policy to manage chronic pain and that she will not be getting prescriptions for narcotics today. Patient verbalizes understanding. Also requesting a note to prove that she was a patient this morning as she states she had a court date for "a misdemeanor" today that she was not able to be at.  Patient appropriate for discharge with primary care follow up and instructions for clear liquid diet; has appt scheduled on 11/25/12 with IM. Indications for ED return discussed. Patient states comfort and understanding with plan with no unaddressed concerns.      Antony Madura, PA-C 11/17/12 1148

## 2012-11-17 NOTE — ED Notes (Addendum)
C/o feeling lightheaded walking down stairs an hour ago causing fall. States fell on butt first then hit side of head on wall. Denies LOC. Pt ambulatory to ED & in room. C/o low back pain & h/a

## 2012-11-25 ENCOUNTER — Encounter: Payer: Self-pay | Admitting: Internal Medicine

## 2012-12-02 DIAGNOSIS — IMO0002 Reserved for concepts with insufficient information to code with codable children: Secondary | ICD-10-CM | POA: Insufficient documentation

## 2012-12-04 ENCOUNTER — Other Ambulatory Visit: Payer: Self-pay

## 2012-12-05 ENCOUNTER — Ambulatory Visit (INDEPENDENT_AMBULATORY_CARE_PROVIDER_SITE_OTHER): Payer: PRIVATE HEALTH INSURANCE | Admitting: Internal Medicine

## 2012-12-05 ENCOUNTER — Encounter: Payer: Self-pay | Admitting: Internal Medicine

## 2012-12-05 VITALS — BP 146/93 | HR 84 | Temp 97.6°F | Ht 62.0 in | Wt 196.5 lb

## 2012-12-05 DIAGNOSIS — Z0000 Encounter for general adult medical examination without abnormal findings: Secondary | ICD-10-CM | POA: Insufficient documentation

## 2012-12-05 DIAGNOSIS — I10 Essential (primary) hypertension: Secondary | ICD-10-CM

## 2012-12-05 DIAGNOSIS — G8929 Other chronic pain: Secondary | ICD-10-CM

## 2012-12-05 DIAGNOSIS — K219 Gastro-esophageal reflux disease without esophagitis: Secondary | ICD-10-CM

## 2012-12-05 DIAGNOSIS — E039 Hypothyroidism, unspecified: Secondary | ICD-10-CM

## 2012-12-05 DIAGNOSIS — Z0189 Encounter for other specified special examinations: Secondary | ICD-10-CM

## 2012-12-05 DIAGNOSIS — R110 Nausea: Secondary | ICD-10-CM

## 2012-12-05 DIAGNOSIS — F17200 Nicotine dependence, unspecified, uncomplicated: Secondary | ICD-10-CM

## 2012-12-05 DIAGNOSIS — Z720 Tobacco use: Secondary | ICD-10-CM | POA: Insufficient documentation

## 2012-12-05 DIAGNOSIS — E119 Type 2 diabetes mellitus without complications: Secondary | ICD-10-CM

## 2012-12-05 LAB — GLUCOSE, CAPILLARY: Glucose-Capillary: 188 mg/dL — ABNORMAL HIGH (ref 70–99)

## 2012-12-05 MED ORDER — GLIPIZIDE 10 MG PO TABS: 10.0000 mg | ORAL_TABLET | Freq: Every morning | ORAL | Status: DC

## 2012-12-05 MED ORDER — PANTOPRAZOLE SODIUM 40 MG PO TBEC: 40.0000 mg | DELAYED_RELEASE_TABLET | Freq: Every day | ORAL | Status: DC

## 2012-12-05 MED ORDER — OXYCODONE HCL 10 MG PO TABS: 10.0000 mg | ORAL_TABLET | Freq: Three times a day (TID) | ORAL | Status: DC | PRN

## 2012-12-05 MED ORDER — GLIPIZIDE 5 MG PO TABS: 5.0000 mg | ORAL_TABLET | Freq: Every evening | ORAL | Status: DC

## 2012-12-05 MED ORDER — SUCRALFATE 1 GM/10ML PO SUSP: 1.0000 g | Freq: Four times a day (QID) | ORAL | Status: DC

## 2012-12-05 MED ORDER — OXYCODONE HCL 7.5 MG PO TABS: 7.5000 mg | ORAL_TABLET | Freq: Three times a day (TID) | ORAL | Status: DC | PRN

## 2012-12-05 MED ORDER — ONDANSETRON 4 MG PO TBDP: 4.0000 mg | ORAL_TABLET | Freq: Three times a day (TID) | ORAL | Status: DC | PRN

## 2012-12-05 NOTE — Progress Notes (Signed)
  Subjective:    Patient ID: [name], female    DOB: 1955/08/09, 57 y.o.   MRN: [id number]  HPI Comments: 57 y.o woman PMH atypical chest pain, arthritis, history of substance abuse, glaucoma, hypothyroidism, DM (HA1C 8.8 10/2012, cbg 188 today), HTN (BP 146/93), schizophrenia/bipolar/depression (follows with [facility]), gout, tobacco abuse, chronic back pain (she will follow with pain clinic in 2-3 weeks [facility]), CKD, dyslipidemia, pancreatitis, trichomonas.  She presents for hospital follow up for chest pain.  She had a nuclear stress test which was negative.  She has not had any more chest pain.  She recently relocated from [facility] and wants to establish care with [facility]. She previously followed with [facility] Dr. Pennelope Bracken [phone].  She did not bring her medications or meter to visit today.  She states intermittently she has epigastric pain that radiates to her back and is mild today.  She denies nausea/vomiting. She has chronic low back pain due to "degenerative disease" today pain is 9/10. She tried at least 6 Tylenol per day which is not helping. She request 1 month supply of pain medication to hold her over until she can see her pain clinic where she will get pain medications.    Health maintenance: She needs a colonoscopy, and eye exam  Since hospital discharge she was only taking Synthyroid 200 mcg and not 250 mcg.   She request Rx refills of pain medication (Oxycodone-acetaminophen w/o acetaminophen), protonix, Carafate.  She requests a Rx for Zofran as sometimes she is nauseated. The last time she was nauseated was last week especially when she eats food that she is not supposed to.    ROS per HPI  SH: married husband was recently sick with sarcoma of thigh. Denies alcohol, drugs, on disability. 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  3 kids, grandkids and great grand kids      Review of Systems     Objective:   Physical Exam  Nursing  note and vitals reviewed. Constitutional: She is oriented to person, place, and time. She appears well-developed and well-nourished. She is cooperative. No distress.  HENT:  Head: Normocephalic and atraumatic.  Mouth/Throat: Oropharynx is clear and moist and mucous membranes are normal. Abnormal dentition. No oropharyngeal exudate.  Eyes: Conjunctivae are normal. Pupils are equal, round, and reactive to light. Right eye exhibits no discharge. Left eye exhibits no discharge. No scleral icterus.  Cardiovascular: Normal rate, regular rhythm, S1 normal, S2 normal and normal heart sounds.   No murmur heard. Pulmonary/Chest: Effort normal and breath sounds normal. No respiratory distress. She has no wheezes.  Abdominal: Soft. Bowel sounds are normal. There is no tenderness.  Obese ab  Neurological: She is alert and oriented to person, place, and time. Gait normal.  Skin: Skin is warm, dry and intact. No rash noted. She is not diaphoretic.  Psychiatric: She has a normal mood and affect. Her speech is normal and behavior is normal. Judgment and thought content normal. Cognition and memory are normal.          Assessment & Plan:  Repeat thyroid labs in 6-8 weeks  Referred for sleep study and pfts to w/u for OSA, colonoscopy  At RV will refer for opthalmology  Try to get labs from [facility] from 12/02/12, Try to get upper endoscopy from [facility] Will refer to Gavin Pound for DM education

## 2012-12-05 NOTE — Assessment & Plan Note (Signed)
Only Rx one time Oxycodone 10 mg q8 hours as needed #84 no refills  Patient to follow with pain clinic

## 2012-12-05 NOTE — Assessment & Plan Note (Signed)
Will try to get records from [facility] Upper endoscopy Referred for colonoscopy  Will need eye exam in the future

## 2012-12-05 NOTE — Assessment & Plan Note (Signed)
  Assessment: Progress toward smoking cessation:  smoking less Barriers to progress toward smoking cessation:  other tobacco users at home Comments: none  Plan: Instruction/counseling given:  I counseled patient on the dangers of tobacco use, advised patient to stop smoking, and reviewed strategies to maximize success. Educational resources provided: handout    Self management tools provided: none   Medications to assist with smoking cessation:  Nicotine Patch-patient already has patches Patient agreed to the following self-care plans for smoking cessation: call [facility] (1-800-QUIT-NOW)  Other plans: pfts to assess lungs

## 2012-12-05 NOTE — Assessment & Plan Note (Signed)
Advised to increased Synthyroid to 250 mcg daily  Repeat thyroid studies in 6-8 weeks

## 2012-12-05 NOTE — Assessment & Plan Note (Signed)
Lab Results  Component Value Date   HGBA1C 8.8* 11/07/2012   HGBA1C 12.7* 07/19/2012   HGBA1C 7.9* 03/04/2012     Assessment: Diabetes control: fair control Progress toward A1C goal:  improved Comments: none  Plan: Medications:  continue current medications. Glipizide 10 qd increased to 10 qam and 5 qpm  Home glucose monitoring: Frequency: 3 times a day Timing:   Instruction/counseling given: reminded to bring blood glucose meter & log to each visit, reminded to bring medications to each visit and other instruction/counseling: will refer to Gavin Pound  Educational resources provided: brochure;handout Self management tools provided: home glucose logbook Other plans: needs eye exam in the future

## 2012-12-05 NOTE — Patient Instructions (Addendum)
General Instructions: Please increase your Glipizide to 10 in the morning and 5 mg at night  Please make an appointment to see out diabetes educator  Please take Synthyroid 250 mcg daily Return in 6-8 weeks for thyroid studies  Will refer you to the eye doctor in the future.   Keep you colonoscopy appointment  Pick up your prescriptions from the pharmacy    Treatment Goals:  Goals (1 Years of Data) as of 12/05/12   None      Progress Toward Treatment Goals:  Treatment Goal 12/05/2012  Hemoglobin A1C improved  Blood pressure unable to assess  Stop smoking smoking less  Prevent falls unable to assess    Self Care Goals & Plans:  Self Care Goal 12/05/2012  Manage my medications take my medicines as prescribed; bring my medications to every visit; refill my medications on time  Monitor my health keep track of my blood glucose; bring my glucose meter and log to each visit; keep track of my blood pressure  Eat healthy foods drink diet soda or water instead of juice or soda; eat more vegetables; eat foods that are low in salt; eat baked foods instead of fried foods; eat fruit for snacks and desserts; eat smaller portions  Be physically active find an activity I enjoy  Stop smoking call [facility] (1-800-QUIT-NOW)  Prevent falls use home fall prevention checklist to improve safety  Meeting treatment goals maintain the current self-care plan    Home Blood Glucose Monitoring 12/05/2012  Check my blood sugar 3 times a day     Care Management & Community Referrals:  Referral 12/05/2012  Referrals made for care management support none needed; diabetes educator  Referrals made to community resources none       Cholesterol Cholesterol is a white, waxy, fat-like protein needed by your body in small amounts. The liver makes all the cholesterol you need. It is carried from the liver by the blood through the blood vessels. Deposits (plaque) may build up on blood vessel walls. This makes  the arteries narrower and stiffer. Plaque increases the risk for heart attack and stroke. You cannot feel your cholesterol level even if it is very high. The only way to know is by a blood test to check your lipid (fats) levels. Once you know your cholesterol levels, you should keep a record of the test results. Work with your caregiver to to keep your levels in the desired range. WHAT THE RESULTS MEAN:  Total cholesterol is a rough measure of all the cholesterol in your blood.  LDL is the so-called bad cholesterol. This is the type that deposits cholesterol in the walls of the arteries. You want this level to be low.  HDL is the good cholesterol because it cleans the arteries and carries the LDL away. You want this level to be high.  Triglycerides are fat that the body can either burn for energy or store. High levels are closely linked to heart disease. DESIRED LEVELS:  Total cholesterol below 200.  LDL below 100 for people at risk, below 70 for very high risk.  HDL above 50 is good, above 60 is best.  Triglycerides below 150. HOW TO LOWER YOUR CHOLESTEROL:  Diet.  Choose fish or white meat chicken and [place], roasted or baked. Limit fatty cuts of red meat, fried foods, and processed meats, such as sausage and lunch meat.  Eat lots of fresh fruits and vegetables. Choose whole grains, beans, pasta, potatoes and cereals.  Use only  small amounts of olive, corn or canola oils. Avoid butter, mayonnaise, shortening or palm kernel oils. Avoid foods with trans-fats.  Use skim/nonfat milk and low-fat/nonfat yogurt and cheeses. Avoid whole milk, cream, ice cream, egg yolks and cheeses. Healthy desserts include angel food cake, ginger snaps, animal crackers, hard candy, popsicles, and low-fat/nonfat frozen yogurt. Avoid pastries, cakes, pies and cookies.  Exercise.  A regular program helps decrease LDL and raises HDL.  Helps with weight control.  Do things that increase your activity  level like gardening, walking, or taking the stairs.  Medication.  May be prescribed by your caregiver to help lowering cholesterol and the risk for heart disease.  You may need medicine even if your levels are normal if you have several risk factors. HOME CARE INSTRUCTIONS   Follow your diet and exercise programs as suggested by your caregiver.  Take medications as directed.  Have blood work done when your caregiver feels it is necessary. MAKE SURE YOU:   Understand these instructions.  Will watch your condition.  Will get help right away if you are not doing well or get worse. Document Released: 02/06/2001 Document Revised: 08/06/2011 Document Reviewed: 07/30/2007 [facility] Patient Information 2014 [place], [place].  Back Pain, Adult Low back pain is very common. About 1 in 5 people have back pain.The cause of low back pain is rarely dangerous. The pain often gets better over time.About half of people with a sudden onset of back pain feel better in just 2 weeks. About 8 in 10 people feel better by 6 weeks.  CAUSES Some common causes of back pain include:  Strain of the muscles or ligaments supporting the spine.  Wear and tear (degeneration) of the spinal discs.  Arthritis.  Direct injury to the back. DIAGNOSIS Most of the time, the direct cause of low back pain is not known.However, back pain can be treated effectively even when the exact cause of the pain is unknown.Answering your caregiver's questions about your overall health and symptoms is one of the most accurate ways to make sure the cause of your pain is not dangerous. If your caregiver needs more information, he or she may order lab work or imaging tests (X-rays or MRIs).However, even if imaging tests show changes in your back, this usually does not require surgery. HOME CARE INSTRUCTIONS For many people, back pain returns.Since low back pain is rarely dangerous, it is often a condition that people can learn to  [facility] their own.   Remain active. It is stressful on the back to sit or stand in one place. Do not sit, drive, or stand in one place for more than 30 minutes at a time. Take short walks on level surfaces as soon as pain allows.Try to increase the length of time you walk each day.  Do not stay in bed.Resting more than 1 or 2 days can delay your recovery.  Do not avoid exercise or work.Your body is made to move.It is not dangerous to be active, even though your back may hurt.Your back will likely heal faster if you return to being active before your pain is gone.  Pay attention to your body when you bend and lift. Many people have less discomfortwhen lifting if they bend their knees, keep the load close to their bodies,and avoid twisting. Often, the most comfortable positions are those that put less stress on your recovering back.  Find a comfortable position to sleep. Use a firm mattress and lie on your side with your knees slightly  bent. If you lie on your back, put a pillow under your knees.  Only take over-the-counter or prescription medicines as directed by your caregiver. Over-the-counter medicines to reduce pain and inflammation are often the most helpful.Your caregiver may prescribe muscle relaxant drugs.These medicines help dull your pain so you can more quickly return to your normal activities and healthy exercise.  Put ice on the injured area.  Put ice in a plastic bag.  Place a towel between your skin and the bag.  Leave the ice on for 15-20 minutes, 3-4 times a day for the first 2 to 3 days. After that, ice and heat may be alternated to reduce pain and spasms.  Ask your caregiver about trying back exercises and gentle massage. This may be of some benefit.  Avoid feeling anxious or stressed.Stress increases muscle tension and can worsen back pain.It is important to recognize when you are anxious or stressed and learn ways to manage it.Exercise is a great  option. SEEK MEDICAL CARE IF:  You have pain that is not relieved with rest or medicine.  You have pain that does not improve in 1 week.  You have new symptoms.  You are generally not feeling well. SEEK IMMEDIATE MEDICAL CARE IF:   You have pain that radiates from your back into your legs.  You develop new bowel or bladder control problems.  You have unusual weakness or numbness in your arms or legs.  You develop nausea or vomiting.  You develop abdominal pain.  You feel faint. Document Released: 05/14/2005 Document Revised: 11/13/2011 Document Reviewed: 10/02/2010 [facility] Patient Information 2014 [place], [place].  Chest Pain (Nonspecific) It is often hard to give a specific diagnosis for the cause of chest pain. There is always a chance that your pain could be related to something serious, such as a heart attack or a blood clot in the lungs. You need to follow up with your caregiver for further evaluation. CAUSES   Heartburn.  Pneumonia or bronchitis.  Anxiety or stress.  Inflammation around your heart (pericarditis) or lung (pleuritis or pleurisy).  A blood clot in the lung.  A collapsed lung (pneumothorax). It can develop suddenly on its own (spontaneous pneumothorax) or from injury (trauma) to the chest.  Shingles infection (herpes zoster virus). The chest wall is composed of bones, muscles, and cartilage. Any of these can be the source of the pain.  The bones can be bruised by injury.  The muscles or cartilage can be strained by coughing or overwork.  The cartilage can be affected by inflammation and become sore (costochondritis). DIAGNOSIS  Lab tests or other studies, such as X-rays, electrocardiography, stress testing, or cardiac imaging, may be needed to find the cause of your pain.  TREATMENT   Treatment depends on what may be causing your chest pain. Treatment may include:  Acid blockers for heartburn.  Anti-inflammatory medicine.  Pain medicine  for inflammatory conditions.  Antibiotics if an infection is present.  You may be advised to change lifestyle habits. This includes stopping smoking and avoiding alcohol, caffeine, and chocolate.  You may be advised to keep your head raised (elevated) when sleeping. This reduces the chance of acid going backward from your stomach into your esophagus.  Most of the time, nonspecific chest pain will improve within 2 to 3 days with rest and mild pain medicine. HOME CARE INSTRUCTIONS   If antibiotics were prescribed, take your antibiotics as directed. Finish them even if you start to feel better.  For the  next few days, avoid physical activities that bring on chest pain. Continue physical activities as directed.  Do not smoke.  Avoid drinking alcohol.  Only take over-the-counter or prescription medicine for pain, discomfort, or fever as directed by your caregiver.  Follow your caregiver's suggestions for further testing if your chest pain does not go away.  Keep any follow-up appointments you made. If you do not go to an appointment, you could develop lasting (chronic) problems with pain. If there is any problem keeping an appointment, you must call to reschedule. SEEK MEDICAL CARE IF:   You think you are having problems from the medicine you are taking. Read your medicine instructions carefully.  Your chest pain does not go away, even after treatment.  You develop a rash with blisters on your chest. SEEK IMMEDIATE MEDICAL CARE IF:   You have increased chest pain or pain that spreads to your arm, neck, jaw, back, or abdomen.  You develop shortness of breath, an increasing cough, or you are coughing up blood.  You have severe back or abdominal pain, feel nauseous, or vomit.  You develop severe weakness, fainting, or chills.  You have a fever. THIS IS AN EMERGENCY. Do not wait to see if the pain will go away. Get medical help at once. Call your local emergency services (911 in  [place] Do not drive yourself to the hospital. MAKE SURE YOU:   Understand these instructions.  Will watch your condition.  Will get help right away if you are not doing well or get worse. Document Released: 02/21/2005 Document Revised: 08/06/2011 Document Reviewed: 12/18/2007 [facility] Patient Information 2014 [place], [place].  Hypothyroidism The thyroid is a large gland located in the lower front of your neck. The thyroid gland helps control metabolism. Metabolism is how your body handles food. It controls metabolism with the hormone thyroxine. When this gland is underactive (hypothyroid), it produces too little hormone.  CAUSES These include:   Absence or destruction of thyroid tissue.  Goiter due to iodine deficiency.  Goiter due to medications.  Congenital defects (since birth).  Problems with the pituitary. This causes a lack of TSH (thyroid stimulating hormone). This hormone tells the thyroid to turn out more hormone. SYMPTOMS  Lethargy (feeling as though you have no energy)  Cold intolerance  Weight gain (in spite of normal food intake)  Dry skin  Coarse hair  Menstrual irregularity (if severe, may lead to infertility)  Slowing of thought processes Cardiac problems are also caused by insufficient amounts of thyroid hormone. Hypothyroidism in the newborn is cretinism, and is an extreme form. It is important that this form be treated adequately and immediately or it will lead rapidly to retarded physical and mental development. DIAGNOSIS  To prove hypothyroidism, your caregiver may do blood tests and ultrasound tests. Sometimes the signs are hidden. It may be necessary for your caregiver to watch this illness with blood tests either before or after diagnosis and treatment. TREATMENT  Low levels of thyroid hormone are increased by using synthetic thyroid hormone. This is a safe, effective treatment. It usually takes about four weeks to gain the full effects of the  medication. After you have the full effect of the medication, it will generally take another four weeks for problems to leave. Your caregiver may start you on low doses. If you have had heart problems the dose may be gradually increased. It is generally not an emergency to get rapidly to normal. HOME CARE INSTRUCTIONS   Take your medications as  your caregiver suggests. Let your caregiver know of any medications you are taking or start taking. Your caregiver will help you with dosage schedules.  As your condition improves, your dosage needs may increase. It will be necessary to have continuing blood tests as suggested by your caregiver.  Report all suspected medication side effects to your caregiver. SEEK MEDICAL CARE IF: Seek medical care if you develop:  Sweating.  Tremulousness (tremors).  Anxiety.  Rapid weight loss.  Heat intolerance.  Emotional swings.  Diarrhea.  Weakness. SEEK IMMEDIATE MEDICAL CARE IF:  You develop chest pain, an irregular heart beat (palpitations), or a rapid heart beat. MAKE SURE YOU:   Understand these instructions.  Will watch your condition.  Will get help right away if you are not doing well or get worse. Document Released: 05/14/2005 Document Revised: 08/06/2011 Document Reviewed: 01/02/2008 [facility] Patient Information 2014 [place], [place].  Type 2 Diabetes Mellitus, Adult Type 2 diabetes mellitus, often simply referred to as type 2 diabetes, is a long-lasting (chronic) disease. In type 2 diabetes, the pancreas does not make enough insulin (a hormone), the cells are less responsive to the insulin that is made (insulin resistance), or both. Normally, insulin moves sugars from food into the tissue cells. The tissue cells use the sugars for energy. The lack of insulin or the lack of normal response to insulin causes excess sugars to build up in the blood instead of going into the tissue cells. As a result, high blood sugar (hyperglycemia) develops.  The effect of high sugar (glucose) levels can cause many complications. Type 2 diabetes was also previously called adult-onset diabetes but it can occur at any age.  RISK FACTORS  A person is predisposed to developing type 2 diabetes if someone in the family has the disease and also has one or more of the following primary risk factors:  Overweight.  An inactive lifestyle.  A history of consistently eating high-calorie foods. Maintaining a normal weight and regular physical activity can reduce the chance of developing type 2 diabetes. SYMPTOMS  A person with type 2 diabetes may not show symptoms initially. The symptoms of type 2 diabetes appear slowly. The symptoms include:  Increased thirst (polydipsia).  Increased urination (polyuria).  Increased urination during the night (nocturia).  Weight loss. This weight loss may be rapid.  Frequent, recurring infections.  Tiredness (fatigue).  Weakness.  Vision changes, such as blurred vision.  Fruity smell to your breath.  Abdominal pain.  Nausea or vomiting.  Cuts or bruises which are slow to heal.  Tingling or numbness in the hands or feet. DIAGNOSIS Type 2 diabetes is frequently not diagnosed until complications of diabetes are present. Type 2 diabetes is diagnosed when symptoms or complications are present and when blood glucose levels are increased. Your blood glucose level may be checked by one or more of the following blood tests:  A fasting blood glucose test. You will not be allowed to eat for at least 8 hours before a blood sample is taken.  A random blood glucose test. Your blood glucose is checked at any time of the day regardless of when you ate.  A hemoglobin A1c blood glucose test. A hemoglobin A1c test provides information about blood glucose control over the previous 3 months.  An oral glucose tolerance test (OGTT). Your blood glucose is measured after you have not eaten (fasted) for 2 hours and then after  you drink a glucose-containing beverage. TREATMENT   You may need to take insulin or  diabetes medicine daily to keep blood glucose levels in the desired range.  You will need to match insulin dosing with exercise and healthy food choices. The treatment goal is to maintain the before meal blood sugar (preprandial glucose) level at 70 130 mg/dL. HOME CARE INSTRUCTIONS   Have your hemoglobin A1c level checked twice a year.  Perform daily blood glucose monitoring as directed by your caregiver.  Monitor urine ketones when you are ill and as directed by your caregiver.  Take your diabetes medicine or insulin as directed by your caregiver to maintain your blood glucose levels in the desired range.  Never run out of diabetes medicine or insulin. It is needed every day.  Adjust insulin based on your intake of carbohydrates. Carbohydrates can raise blood glucose levels but need to be included in your diet. Carbohydrates provide vitamins, minerals, and fiber which are an essential part of a healthy diet. Carbohydrates are found in fruits, vegetables, whole grains, dairy products, legumes, and foods containing added sugars.    Eat healthy foods. Alternate 3 meals with 3 snacks.  Lose weight if overweight.  Carry a medical alert card or wear your medical alert jewelry.  Carry a 15 gram carbohydrate snack with you at all times to treat low blood glucose (hypoglycemia). Some examples of 15 gram carbohydrate snacks include:  Glucose tablets, 3 or 4   Glucose gel, 15 gram tube  Raisins, 2 tablespoons (24 grams)  Jelly beans, 6  Animal crackers, 8  Regular pop, 4 ounces (120 mL)  Gummy treats, 9  Recognize hypoglycemia. Hypoglycemia occurs with blood glucose levels of 70 mg/dL and below. The risk for hypoglycemia increases when fasting or skipping meals, during or after intense exercise, and during sleep. Hypoglycemia symptoms can include:  Tremors or shakes.  Decreased ability to  concentrate.  Sweating.  Increased heart rate.  Headache.  Dry mouth.  Hunger.  Irritability.  Anxiety.  Restless sleep.  Altered speech or coordination.  Confusion.  Treat hypoglycemia promptly. If you are alert and able to safely swallow, follow the 15:15 rule:  Take 15 20 grams of rapid-acting glucose or carbohydrate. Rapid-acting options include glucose gel, glucose tablets, or 4 ounces (120 mL) of fruit juice, regular soda, or low fat milk.  Check your blood glucose level 15 minutes after taking the glucose.  Take 15 20 grams more of glucose if the repeat blood glucose level is still 70 mg/dL or below.  Eat a meal or snack within 1 hour once blood glucose levels return to normal.    Be alert to polyuria and polydipsia which are early signs of hyperglycemia. An early awareness of hyperglycemia allows for prompt treatment. Treat hyperglycemia as directed by your caregiver.  Engage in at least 150 minutes of moderate-intensity physical activity a week, spread over at least 3 days of the week or as directed by your caregiver. In addition, you should engage in resistance exercise at least 2 times a week or as directed by your caregiver.  Adjust your medicine and food intake as needed if you start a new exercise or sport.  Follow your sick day plan at any time you are unable to eat or drink as usual.  Avoid tobacco use.  Limit alcohol intake to no more than 1 drink per day for nonpregnant women and 2 drinks per day for men. You should drink alcohol only when you are also eating food. Talk with your caregiver whether alcohol is safe for you. Tell your caregiver if  you drink alcohol several times a week.  Follow up with your caregiver regularly.  Schedule an eye exam soon after the diagnosis of type 2 diabetes and then annually.  Perform daily skin and foot care. Examine your skin and feet daily for cuts, bruises, redness, nail problems, bleeding, blisters, or sores.  A foot exam by a caregiver should be done annually.  Brush your teeth and gums at least twice a day and floss at least once a day. Follow up with your dentist regularly.  Share your diabetes management plan with your workplace or school.  Stay up-to-date with immunizations.  Learn to manage stress.  Obtain ongoing diabetes education and support as needed.  Participate in, or seek rehabilitation as needed to maintain or improve independence and quality of life. Request a physical or occupational therapy referral if you are having foot or hand numbness or difficulties with grooming, dressing, eating, or physical activity. SEEK MEDICAL CARE IF:   You are unable to eat food or drink fluids for more than 6 hours.  You have nausea and vomiting for more than 6 hours.  Your blood glucose level is over 240 mg/dL.  There is a change in mental status.  You develop an additional serious illness.  You have diarrhea for more than 6 hours.  You have been sick or have had a fever for a couple of days and are not getting better.  You have pain during any physical activity.  SEEK IMMEDIATE MEDICAL CARE IF:  You have difficulty breathing.  You have moderate to large ketone levels. MAKE SURE YOU:  Understand these instructions.  Will watch your condition.  Will get help right away if you are not doing well or get worse. Document Released: 05/14/2005 Document Revised: 02/06/2012 Document Reviewed: 12/11/2011 [facility] Patient Information 2014 [place], [place].  Hypertension As your heart beats, it forces blood through your arteries. This force is your blood pressure. If the pressure is too high, it is called hypertension (HTN) or high blood pressure. HTN is dangerous because you may have it and not know it. High blood pressure may mean that your heart has to work harder to pump blood. Your arteries may be narrow or stiff. The extra work puts you at risk for heart disease, stroke, and other  problems.  Blood pressure consists of two numbers, a higher number over a lower, 110/72, for example. It is stated as "110 over 72." The ideal is below 120 for the top number (systolic) and under 80 for the bottom (diastolic). Write down your blood pressure today. You should pay close attention to your blood pressure if you have certain conditions such as:  Heart failure.  Prior heart attack.  Diabetes  Chronic kidney disease.  Prior stroke.  Multiple risk factors for heart disease. To see if you have HTN, your blood pressure should be measured while you are seated with your arm held at the level of the heart. It should be measured at least twice. A one-time elevated blood pressure reading (especially in the Emergency Department) does not mean that you need treatment. There may be conditions in which the blood pressure is different between your right and left arms. It is important to see your caregiver soon for a recheck. Most people have essential hypertension which means that there is not a specific cause. This type of high blood pressure may be lowered by changing lifestyle factors such as:  Stress.  Smoking.  Lack of exercise.  Excessive weight.  Drug/tobacco/alcohol  use.  Eating less salt. Most people do not have symptoms from high blood pressure until it has caused damage to the body. Effective treatment can often prevent, delay or reduce that damage. TREATMENT  When a cause has been identified, treatment for high blood pressure is directed at the cause. There are a large number of medications to treat HTN. These fall into several categories, and your caregiver will help you select the medicines that are best for you. Medications may have side effects. You should review side effects with your caregiver. If your blood pressure stays high after you have made lifestyle changes or started on medicines,   Your medication(s) may need to be changed.  Other problems may need to be  addressed.  Be certain you understand your prescriptions, and know how and when to take your medicine.  Be sure to follow up with your caregiver within the time frame advised (usually within two weeks) to have your blood pressure rechecked and to review your medications.  If you are taking more than one medicine to lower your blood pressure, make sure you know how and at what times they should be taken. Taking two medicines at the same time can result in blood pressure that is too low. SEEK IMMEDIATE MEDICAL CARE IF:  You develop a severe headache, blurred or changing vision, or confusion.  You have unusual weakness or numbness, or a faint feeling.  You have severe chest or abdominal pain, vomiting, or breathing problems. MAKE SURE YOU:   Understand these instructions.  Will watch your condition.  Will get help right away if you are not doing well or get worse. Document Released: 05/14/2005 Document Revised: 08/06/2011 Document Reviewed: 01/02/2008 [facility] Patient Information 2014 [place], [place].  Smoking Cessation Quitting smoking is important to your health and has many advantages. However, it is not always easy to quit since nicotine is a very addictive drug. Often times, people try 3 times or more before being able to quit. This document explains the best ways for you to prepare to quit smoking. Quitting takes hard work and a lot of effort, but you can do it. ADVANTAGES OF QUITTING SMOKING  You will live longer, feel better, and live better.  Your body will feel the impact of quitting smoking almost immediately.  Within 20 minutes, blood pressure decreases. Your pulse returns to its normal level.  After 8 hours, carbon monoxide levels in the blood return to normal. Your oxygen level increases.  After 24 hours, the chance of having a heart attack starts to decrease. Your breath, hair, and body stop smelling like smoke.  After 48 hours, damaged nerve endings begin to  recover. Your sense of taste and smell improve.  After 72 hours, the body is virtually free of nicotine. Your bronchial tubes relax and breathing becomes easier.  After 2 to 12 weeks, lungs can hold more air. Exercise becomes easier and circulation improves.  The risk of having a heart attack, stroke, cancer, or lung disease is greatly reduced.  After 1 year, the risk of coronary heart disease is cut in half.  After 5 years, the risk of stroke falls to the same as a nonsmoker.  After 10 years, the risk of lung cancer is cut in half and the risk of other cancers decreases significantly.  After 15 years, the risk of coronary heart disease drops, usually to the level of a nonsmoker.  If you are pregnant, quitting smoking will improve your chances of having a healthy  baby.  The people you live with, especially any children, will be healthier.  You will have extra money to spend on things other than cigarettes. QUESTIONS TO THINK ABOUT BEFORE ATTEMPTING TO QUIT You may want to talk about your answers with your caregiver.  Why do you want to quit?  If you tried to quit in the past, what helped and what did not?  What will be the most difficult situations for you after you quit? How will you plan to handle them?  Who can help you through the tough times? Your family? Friends? A caregiver?  What pleasures do you get from smoking? What ways can you still get pleasure if you quit? Here are some questions to ask your caregiver:  How can you help me to be successful at quitting?  What medicine do you think would be best for me and how should I take it?  What should I do if I need more help?  What is smoking withdrawal like? How can I get information on withdrawal? GET READY  Set a quit date.  Change your environment by getting rid of all cigarettes, ashtrays, matches, and lighters in your home, car, or work. Do not let people smoke in your home.  Review your past attempts to quit.  Think about what worked and what did not. GET SUPPORT AND ENCOURAGEMENT You have a better chance of being successful if you have help. You can get support in many ways.  Tell your family, friends, and co-workers that you are going to quit and need their support. Ask them not to smoke around you.  Get individual, group, or telephone counseling and support. Programs are available at [employer] and health centers. Call your local health department for information about programs in your area.  Spiritual beliefs and practices may help some smokers quit.  Download a "quit meter" on your computer to keep track of quit statistics, such as how long you have gone without smoking, cigarettes not smoked, and money saved.  Get a self-help book about quitting smoking and staying off of tobacco. LEARN NEW SKILLS AND BEHAVIORS  Distract yourself from urges to smoke. Talk to someone, go for a walk, or occupy your time with a task.  Change your normal routine. Take a different route to work. Drink tea instead of coffee. Eat breakfast in a different place.  Reduce your stress. Take a hot bath, exercise, or read a book.  Plan something enjoyable to do every day. Reward yourself for not smoking.  Explore interactive web-based programs that specialize in helping you quit. GET MEDICINE AND USE IT CORRECTLY Medicines can help you stop smoking and decrease the urge to smoke. Combining medicine with the above behavioral methods and support can greatly increase your chances of successfully quitting smoking.  Nicotine replacement therapy helps deliver nicotine to your body without the negative effects and risks of smoking. Nicotine replacement therapy includes nicotine gum, lozenges, inhalers, nasal sprays, and skin patches. Some may be available over-the-counter and others require a prescription.  Antidepressant medicine helps people abstain from smoking, but how this works is unknown. This medicine is  available by prescription.  Nicotinic receptor partial agonist medicine simulates the effect of nicotine in your brain. This medicine is available by prescription. Ask your caregiver for advice about which medicines to use and how to use them based on your health history. Your caregiver will tell you what side effects to look out for if you choose to be on a  medicine or therapy. Carefully read the information on the package. Do not use any other product containing nicotine while using a nicotine replacement product.  RELAPSE OR DIFFICULT SITUATIONS Most relapses occur within the first 3 months after quitting. Do not be discouraged if you start smoking again. Remember, most people try several times before finally quitting. You may have symptoms of withdrawal because your body is used to nicotine. You may crave cigarettes, be irritable, feel very hungry, cough often, get headaches, or have difficulty concentrating. The withdrawal symptoms are only temporary. They are strongest when you first quit, but they will go away within 10 14 days. To reduce the chances of relapse, try to:  Avoid drinking alcohol. Drinking lowers your chances of successfully quitting.  Reduce the amount of caffeine you consume. Once you quit smoking, the amount of caffeine in your body increases and can give you symptoms, such as a rapid heartbeat, sweating, and anxiety.  Avoid smokers because they can make you want to smoke.  Do not let weight gain distract you. Many smokers will gain weight when they quit, usually less than 10 pounds. Eat a healthy diet and stay active. You can always lose the weight gained after you quit.  Find ways to improve your mood other than smoking. FOR MORE INFORMATION  www.smokefree.gov  Document Released: 05/08/2001 Document Revised: 11/13/2011 Document Reviewed: 08/23/2011 [facility] Patient Information 2014 [place], [place].  Fall Prevention, Elderly Falls are the leading cause of injuries,  accidents, and accidental deaths in people over the age of 5. Falling is a real threat to your ability to live on your own. CAUSES   Poor eyesight or poor hearing can make you more likely to fall.   Illnesses and physical conditions can affect your strength and balance.   Poor lighting, throw rugs and pets in your home can make you more likely to trip or slip.   The side effects of some medicines can upset your balance and lead to falling. These include medicines for depression, sleep problems, high blood pressure, diabetes, and heart conditions.  PREVENTION  Be sure your home is as safe as possible. Here are some tips:  Wear shoes with non-skid soles (not house slippers).   Be sure your home and outside area are well lit.   Use night lights throughout your house, including hallways and stairways.   Remove clutter and clean up spills on floors and walkways.   Remove throw rugs or fasten them to the floor with carpet tape. Tack down carpet edges.   Do not place electrical cords across pathways.   Install grab bars in your bathtub, shower, and toilet area. Towel bars should not be used as a grab bar.   Install handrails on both sides of stairways.   Do not climb on stools or stepladders. Get someone else to help with jobs that require climbing.   Do not wax your floors at all, or use a non-skid wax.   Repair uneven or unsafe sidewalks, walkways or stairs.   Keep frequently used items within reach.   Be aware of pets so you do not trip.  Get regular check-ups from your doctor, and take good care of yourself:  Have your eyes checked every year for vision changes, cataracts, glaucoma, and other eye problems. Wear eyeglasses as directed.   Have your hearing checked every 2 years, or anytime you or others think that you cannot hear well. Use hearing aids as directed.   See your caregiver if you  have foot pain or corns. Sore feet can contribute to falls.   Let your caregiver  know if a medicine is making you feel dizzy or making you lose your balance.   Use a cane, walker, or wheelchair as directed. Use walker or wheelchair brakes when getting in and out.   When you get up from bed, sit on the side of the bed for 1 to 2 minutes before you stand up. This will give your blood pressure time to adjust, and you will feel less dizzy.   If you need to go to the bathroom often, consider using a bedside commode.  Keep your body in good shape:  Get regular exercise, especially walking.   Do exercises to strengthen the muscles you use for walking and lifting.   Do not smoke.   Minimize use of alcohol.  SEEK IMMEDIATE MEDICAL CARE IF:   You feel dizzy, weak, or unsteady on your feet.   You feel confused.   You fall.  Document Released: 05/14/2005 Document Revised: 05/03/2011 Document Reviewed: 11/08/2006 [facility] Patient Information 2012 [place], [place].

## 2012-12-05 NOTE — Assessment & Plan Note (Signed)
BP Readings from Last 3 Encounters:  12/05/12 146/93  11/17/12 129/93  11/09/12 141/66    Lab Results  Component Value Date   NA 135 11/17/2012   K 3.8 11/17/2012   CREATININE 1.12* 11/17/2012    Assessment: Blood pressure control: mildly elevated Progress toward BP goal:  unable to assess Comments: none  Plan: Medications:  continue current medications Educational resources provided: brochure;handout Self management tools provided: home blood pressure logbook Other plans: get labs from [facility] 7/8 office visit

## 2012-12-08 ENCOUNTER — Other Ambulatory Visit: Payer: Self-pay | Admitting: Dietician

## 2012-12-08 ENCOUNTER — Other Ambulatory Visit: Payer: Self-pay | Admitting: Internal Medicine

## 2012-12-08 DIAGNOSIS — E119 Type 2 diabetes mellitus without complications: Secondary | ICD-10-CM

## 2012-12-08 NOTE — Progress Notes (Signed)
Please sign this referral and in future put in referral to have patient scheduled to see me and also recommend putting it on your AVS to patient. Front office will see that and schedule the visit.

## 2012-12-09 ENCOUNTER — Ambulatory Visit (HOSPITAL_COMMUNITY)
Admission: RE | Admit: 2012-12-09 | Discharge: 2012-12-09 | Disposition: A | Discharge status: Home / Self Care | Payer: PRIVATE HEALTH INSURANCE | Source: Ambulatory Visit | Attending: Internal Medicine | Admitting: Internal Medicine

## 2012-12-09 DIAGNOSIS — F17200 Nicotine dependence, unspecified, uncomplicated: Secondary | ICD-10-CM | POA: Insufficient documentation

## 2012-12-09 DIAGNOSIS — Z720 Tobacco use: Secondary | ICD-10-CM

## 2012-12-09 MED ORDER — ALBUTEROL SULFATE (5 MG/ML) 0.5% IN NEBU
2.5000 mg | INHALATION_SOLUTION | Freq: Once | RESPIRATORY_TRACT | Status: AC
  Administered 2012-12-09: 2.5 mg via RESPIRATORY_TRACT

## 2012-12-09 NOTE — Progress Notes (Signed)
Case discussed with Dr. Shirlee Latch soon after the resident saw the patient.  We reviewed the resident's history and exam and pertinent patient test results.  I agree with the assessment, diagnosis, and plan of care documented in the resident's note.  Agree with plan.  She has improved her A1C and will need 3 month follow up for further evaluation of her diabetes.  She also has elevated BP which will likely need further medication.  However, attempting lifestyle changes and outpatient monitoring in the interim before her next visit is an acceptable plan.

## 2012-12-16 ENCOUNTER — Encounter: Payer: Self-pay | Admitting: Internal Medicine

## 2012-12-19 ENCOUNTER — Encounter (HOSPITAL_COMMUNITY): Payer: Self-pay

## 2013-01-02 ENCOUNTER — Encounter: Payer: Self-pay | Admitting: Internal Medicine

## 2013-01-02 ENCOUNTER — Ambulatory Visit (INDEPENDENT_AMBULATORY_CARE_PROVIDER_SITE_OTHER): Payer: PRIVATE HEALTH INSURANCE | Admitting: Internal Medicine

## 2013-01-02 VITALS — BP 151/88 | HR 84 | Temp 97.0°F | Ht 62.0 in | Wt 196.2 lb

## 2013-01-02 DIAGNOSIS — R079 Chest pain, unspecified: Secondary | ICD-10-CM

## 2013-01-02 DIAGNOSIS — IMO0002 Reserved for concepts with insufficient information to code with codable children: Secondary | ICD-10-CM

## 2013-01-02 DIAGNOSIS — G8929 Other chronic pain: Secondary | ICD-10-CM

## 2013-01-02 DIAGNOSIS — I10 Essential (primary) hypertension: Secondary | ICD-10-CM

## 2013-01-02 MED ORDER — HYDROCHLOROTHIAZIDE 25 MG PO TABS: 25.0000 mg | ORAL_TABLET | Freq: Every day | ORAL | Status: DC

## 2013-01-02 MED ORDER — OXYCODONE HCL 10 MG PO TABS: 10.0000 mg | ORAL_TABLET | Freq: Three times a day (TID) | ORAL | Status: DC | PRN

## 2013-01-02 NOTE — Patient Instructions (Signed)
I will call the pain clinic before ordering MRI. I will give you a call  I have refilled your medications today We will order a drug screening test today as per our policy I will refill oxycodone for 84 pills until you see pain clinic  Please come back in 3 weeks.

## 2013-01-02 NOTE — Progress Notes (Signed)
Pt called -someone stated she was at her doctor's appt and when I told him, this was the [facility], everything went silent - no sound, no dial tone. Called pt back  - no answer; left message about needing urine specimen as ordered per Dr Zada Girt.

## 2013-01-02 NOTE — Assessment & Plan Note (Signed)
Review of medical records indicate red flags for this patient with narcotic seeking behaviors. Patient gives conflicting accounts of the arrangement with the pain clinic. She left the clinic without providing a urine sample for UDS. I prescribed 84 pills of oxycodone 10 mg to take it as 3 times a day. I contacted the pain physician Dr Ardell Isaacs [phone]), who was hopeful that patient can be seen over the next one week.  Plan. -Pain contract signed. -Provided oxycodone for a short time. -Patient will be evaluated in the pain clinic in the next one week.

## 2013-01-02 NOTE — Progress Notes (Addendum)
Patient ID: [name], female   DOB: 19-Oct-1955, 57 y.o.   MRN: [id number]          HPI: [name] is a 57 y.o. woman with a complex past medical history including asthma, diabetes mellitus, hypertension, hyperlipidemia, hypothyroidism, gout, pancreatitis, depression, chronic pain, degenerative joint disease, and bipolar disorder, presents to the clinic with basically a few requests. She wants me to order an MRI of her back, so she can get an appointment with Dr. Ardell Isaacs of pain clinic. She also would also like me to refill her oxycodone due to her pain not being well controlled. The patient is new to this clinic. She is transferring from [facility]. She is basically between PCPs currently.  She states that her previous PCP at [facility] recently faxed all her medical records to the pain clinic by Dr. Ardell Isaacs, but she reports that she was instructed to turn in MRI imaging of her back before she can get an appointment at the pain clinic. I managed to speak with Dr. Jordan Likes on the phone and he does not confirm this request. He believes that the patient can be seen over the coming week and from there he can decide what imaging is needed. The pain clinic was closed since it was Friday afternoon. She currently does not have a scheduled appointment with Dr. Jordan Likes.  Through [facility], I have managed to review some of the records from [facility], which indicated the patient has a questionable hx of narcotic seeking behavior with multiple prescribers which was noted bu her PCP during her last clinic visit at [facility]. She was given a couple pills of oxycodone 10 mg from our clinic recently and she states that she ran out of this.   I have discussed this challenging situation with Dr. Aundria Rud, who recommended that she signs a pain contract (not committing to chronic narcotic prescription) but committing to UDS and other rules as per clinic policy. We will provide oxycodone for only 2 weeks the  plan to send her to [facility] clinic.  She left the clinic without providing a urine sample for UDS.    Past Medical History  Diagnosis Date  . Asthma     past 10 years  . Diabetes mellitus     past 5 years  . Glaucoma     dx 06/2011-[facility]  . Hypertension   . Hyperlipidemia   . Hyperthyroidism   . History of TIA (transient ischemic attack)     01/06/2011  . Pancreatitis   . Gout   . Depression   . Renal disorder     chronic kidney disease, stage 3  . Chronic pain   . Glaucoma   . Arthritis     djd -lower back, left knee  . Degenerative disc disease   . Degenerative disc disease   . Bipolar disorder   . Gastritis    Current Outpatient Prescriptions  Medication Sig Dispense Refill  . allopurinol (ZYLOPRIM) 300 MG tablet Take 300 mg by mouth daily.      Marland Kitchen amLODipine (NORVASC) 10 MG tablet Take 10 mg by mouth. Take 10 mg by mouth once daily.      Marland Kitchen aspirin EC 81 MG tablet Take 81 mg by mouth daily.      Marland Kitchen atorvastatin (LIPITOR) 80 MG tablet Take 80 mg by mouth daily at 6 PM.      . brimonidine (ALPHAGAN) 0.2 % ophthalmic solution Administer 1 drop  to both eyes once daily.      . colchicine 0.6 MG tablet Take 0.6 mg by mouth daily as needed (for gout flare-ups).       . divalproex (DEPAKOTE ER) 500 MG 24 hr tablet Take 1,000 mg by mouth at bedtime.      . fenofibrate 54 MG tablet Take 54 mg by mouth. Take 54 mg by mouth once daily.      . fluticasone (FLOVENT HFA) 44 MCG/ACT inhaler Inhale 2 puffs into the lungs 2 (two) times daily.       Marland Kitchen gemfibrozil (LOPID) 600 MG tablet Take 600 mg by mouth 2 (two) times daily before a meal.      . glipiZIDE (GLUCOTROL) 10 MG tablet Take 1 tablet (10 mg total) by mouth every morning.  30 tablet  4  . glipiZIDE (GLUCOTROL) 5 MG tablet Take 1 tablet (5 mg total) by mouth every evening.  30 tablet  4  . hydrochlorothiazide (HYDRODIURIL) 25 MG tablet Take 1 tablet (25 mg total) by mouth daily. Take 1 tablet (25 mg total) by  mouth daily.  30 tablet  0  . latanoprost (XALATAN) 0.005 % ophthalmic solution Place 1 drop into both eyes at bedtime.      Marland Kitchen levothyroxine (SYNTHROID, LEVOTHROID) 125 MCG tablet Take 250 mcg by mouth daily before breakfast.      . lisinopril (PRINIVIL,ZESTRIL) 40 MG tablet TAKE 1 TABLET BY MOUTH EVERY DAY FOR BLOOD PRESSURE  30 tablet  0  . nicotine (NICODERM CQ - DOSED IN MG/24 HOURS) 14 mg/24hr patch Place 1 patch onto the skin daily.      . nitroGLYCERIN (NITROSTAT) 0.4 MG SL tablet Place 1 tablet (0.4 mg total) under the tongue every 5 (five) minutes as needed for chest pain.  30 tablet  3  . OLANZapine (ZYPREXA) 10 MG tablet Take 10 mg by mouth at bedtime.      . ondansetron (ZOFRAN-ODT) 4 MG disintegrating tablet Take 1 tablet (4 mg total) by mouth every 8 (eight) hours as needed for nausea.  20 tablet  0  . Oxycodone HCl 10 MG TABS Take 1 tablet (10 mg total) by mouth every 8 (eight) hours as needed.  84 tablet  0  . pantoprazole (PROTONIX) 40 MG tablet Take 1 tablet (40 mg total) by mouth daily.  30 tablet  5  . sertraline (ZOLOFT) 25 MG tablet Take 50 mg by mouth daily.      . sucralfate (CARAFATE) 1 GM/10ML suspension Take 10 mLs (1 g total) by mouth 4 (four) times daily.  420 mL  0  . traZODone (DESYREL) 100 MG tablet Take 100 mg by mouth at bedtime.       No current facility-administered medications for this visit.   Family History  Problem Relation Age of Onset  . Arthritis Mother   . Diabetes Mother   . Hypertension Mother   . Kidney disease Mother   . Heart disease Mother     died 82  . Heart disease      mother & father  . Hypertension      mother & father  . Colon cancer Neg Hx   . Prostate cancer Neg Hx   . Breast cancer Neg Hx   . Hypertension Sister   . Hypertension Brother   . Kidney disease Brother    History   Social History  . Marital Status: Single    Spouse Name: N/A    Number of Children:  N/A  . Years of Education: N/A   Occupational History  .  Disability    Social History Main Topics  . Smoking status: Current Every Day Smoker -- 0.01 packs/day for 30 years    Types: Cigarettes  . Smokeless tobacco: Never Used     Comment: 1 cigs/uses patches.  . Alcohol Use: No  . Drug Use: No  . Sexually Active: None   Other Topics Concern  . None   Social History Narrative             Review of Systems: Constitutional: Denies fever, chills, diaphoresis, appetite change and fatigue.  Respiratory: Denies SOB, DOE, cough, chest tightness, and wheezing.  Cardiovascular: No chest pain, palpitations and leg swelling.  Gastrointestinal: No abdominal pain, nausea, vomiting, bloody stools Genitourinary: No dysuria, frequency, hematuria, or flank pain.   Objective:  Physical Exam: Filed Vitals:   01/02/13 1317  BP: 151/88  Pulse: 84  Temp: 97 F (36.1 C)  TempSrc: Oral  Height: 5\' 2"  (1.575 m)  Weight: 196 lb 3.2 oz (88.996 kg)  SpO2: 99%   General: Well nourished. No acute distress.  Lungs: CTA bilaterally. Heart: RRR; no extra sounds or murmurs  Abdomen: Non-distended, normal BS, soft, nontender; no hepatosplenomegaly Back: Tenderness in the thoracic and lumbar regions.  Extremities: No pedal edema. No joint swelling or tenderness. Neurologic: Alert and oriented x3. No obvious neurologic deficits.  Assessment & Plan:  I have discussed my assessment and plan  with Dr. Aundria Rud as detailed under problem based charting.

## 2013-01-02 NOTE — Progress Notes (Signed)
Call [facility] pharmacy on [address] that [employer], [place], will not accept Dr Huntley Dec DEA #. They will need another hard-copy rx. MD made awared. I have not heard from pt at this time.    [id number]- re-called [facility] pharmacy; stated rx went through; MD informed.

## 2013-01-05 NOTE — Progress Notes (Signed)
Case discussed with Dr. Kazibwe soon after the resident saw the patient.  We reviewed the resident's history and exam and pertinent patient test results.  I agree with the assessment, diagnosis, and plan of care documented in the resident's note. 

## 2013-01-07 ENCOUNTER — Ambulatory Visit (HOSPITAL_BASED_OUTPATIENT_CLINIC_OR_DEPARTMENT_OTHER): Payer: PRIVATE HEALTH INSURANCE | Attending: Internal Medicine

## 2013-01-08 ENCOUNTER — Telehealth: Payer: Self-pay | Admitting: *Deleted

## 2013-01-08 NOTE — Telephone Encounter (Signed)
Call from [place] [facility]-patient did not keep appointment for Sleep Study.   Sleep Center to call pt to reschedule appointment prior to patients appointment with PCP.   Angelina Ok, RN 01/08/2013 10:00 AM

## 2013-01-19 ENCOUNTER — Encounter: Payer: Self-pay | Admitting: Internal Medicine

## 2013-01-19 ENCOUNTER — Encounter: Payer: Self-pay | Admitting: Dietician

## 2013-01-23 ENCOUNTER — Encounter: Payer: Self-pay | Admitting: Internal Medicine

## 2013-01-23 ENCOUNTER — Ambulatory Visit (INDEPENDENT_AMBULATORY_CARE_PROVIDER_SITE_OTHER): Payer: PRIVATE HEALTH INSURANCE | Admitting: Internal Medicine

## 2013-01-23 VITALS — BP 171/90 | HR 84 | Temp 97.4°F | Ht 62.0 in | Wt 192.3 lb

## 2013-01-23 DIAGNOSIS — I10 Essential (primary) hypertension: Secondary | ICD-10-CM

## 2013-01-23 DIAGNOSIS — K219 Gastro-esophageal reflux disease without esophagitis: Secondary | ICD-10-CM

## 2013-01-23 DIAGNOSIS — E039 Hypothyroidism, unspecified: Secondary | ICD-10-CM

## 2013-01-23 DIAGNOSIS — J441 Chronic obstructive pulmonary disease with (acute) exacerbation: Secondary | ICD-10-CM

## 2013-01-23 DIAGNOSIS — E119 Type 2 diabetes mellitus without complications: Secondary | ICD-10-CM

## 2013-01-23 DIAGNOSIS — F418 Other specified anxiety disorders: Secondary | ICD-10-CM

## 2013-01-23 DIAGNOSIS — R079 Chest pain, unspecified: Secondary | ICD-10-CM

## 2013-01-23 DIAGNOSIS — M549 Dorsalgia, unspecified: Secondary | ICD-10-CM

## 2013-01-23 DIAGNOSIS — H04123 Dry eye syndrome of bilateral lacrimal glands: Secondary | ICD-10-CM

## 2013-01-23 DIAGNOSIS — F209 Schizophrenia, unspecified: Secondary | ICD-10-CM

## 2013-01-23 DIAGNOSIS — J449 Chronic obstructive pulmonary disease, unspecified: Secondary | ICD-10-CM | POA: Insufficient documentation

## 2013-01-23 DIAGNOSIS — G8929 Other chronic pain: Secondary | ICD-10-CM

## 2013-01-23 MED ORDER — INSULIN GLARGINE 100 UNITS/ML SOLOSTAR PEN: 20.0000 [IU] | PEN_INJECTOR | Freq: Every day | SUBCUTANEOUS | Status: DC

## 2013-01-23 MED ORDER — ALBUTEROL SULFATE HFA 108 (90 BASE) MCG/ACT IN AERS: 2.0000 | INHALATION_SPRAY | Freq: Four times a day (QID) | RESPIRATORY_TRACT | Status: DC | PRN

## 2013-01-23 MED ORDER — SUCRALFATE 1 GM/10ML PO SUSP: 1.0000 g | Freq: Four times a day (QID) | ORAL | Status: DC

## 2013-01-23 MED ORDER — GEMFIBROZIL 600 MG PO TABS: 600.0000 mg | ORAL_TABLET | Freq: Two times a day (BID) | ORAL | Status: DC

## 2013-01-23 MED ORDER — "INSULIN SYRINGE-NEEDLE U-100 31G X 15/64"" 0.3 ML MISC": 1.0000 | Freq: Every day | Status: DC

## 2013-01-23 MED ORDER — FLUTICASONE-SALMETEROL 250-50 MCG/DOSE IN AEPB: 1.0000 | INHALATION_SPRAY | Freq: Two times a day (BID) | RESPIRATORY_TRACT | Status: DC

## 2013-01-23 MED ORDER — LATANOPROST 0.005 % OP SOLN: 1.0000 [drp] | Freq: Every day | OPHTHALMIC | Status: DC

## 2013-01-23 MED ORDER — OXYCODONE HCL 10 MG PO TABS: 10.0000 mg | ORAL_TABLET | Freq: Three times a day (TID) | ORAL | Status: DC | PRN

## 2013-01-23 MED ORDER — BRIMONIDINE TARTRATE 0.2 % OP SOLN: 1.0000 [drp] | Freq: Every day | OPHTHALMIC | Status: DC

## 2013-01-23 MED ORDER — ATORVASTATIN CALCIUM 80 MG PO TABS: 80.0000 mg | ORAL_TABLET | Freq: Every day | ORAL | Status: DC

## 2013-01-23 NOTE — Assessment & Plan Note (Signed)
Recent PFTs with a severe restriction of air flow. Symptoms currently stable, but she still continues to use albuterol nebulizer 3 times a month.  Plan  Started on Advair and albuterol inhaler.

## 2013-01-23 NOTE — Progress Notes (Signed)
Patient ID: ETTER ROYALL, female   DOB: 10/31/1955, 57 y.o.   MRN: [id number]   Subjective:   HPI: Ms.Manette T Moudy is a 57 y.o. woman with past medical history of asthma/COPD, diabetes, hyperlipidemia, hypothyroidism, chronic pain due to chronic pancreatitis, and low back pain among other problems, presents to the clinic requesting for refills of his Oxycodone.  I saw Ms. Kress 3 weeks ago, and we talked about her chronic pain management. I prescribed 84 pills of Oxycodone 10 mg. Patient left the clinic without providing a urine sample for UDS. During that visit, I reiterated to the patient that this clinic will not be able to provide her chronic pain medications since she already has a referral to the pain clinic for 9/25. She will be seen by Dr. Ardell Isaacs.  I will perform a UDS and provide her with Oxycodone to last her until her appointment with Dr Jordan Likes. She reports that she'll be traveling to [place] for one month to attend to her granddaughter. She states that her daughter was murdered on 01/02/2013 in [place].  DM2, fairly controlled -  Lab Results  Component Value Date   HGBA1C 8.8* 11/07/2012   Patient checking blood sugars 3 times daily, before breakfast and before lunch and before dinner. Reports fasting blood sugars of 200-400 mg/dL. Currently taking glipizide 10 mg daily. Patient misses doses 0 x per week on average.  0 hypoglycemic episodes since last visit. Denies assisted hypoglycemia or recently hospitalizations for either hyper or hypoglycemia. denies polyuria, polydipsia, nausea, vomiting, diarrhea.  Requests to be switched to insulin.   Kindly see the A&P for the status of the pt's chronic medical problems.   Past Medical History  Diagnosis Date  . Asthma     past 10 years  . Diabetes mellitus     past 5 years  . Glaucoma     dx 06/2011-[facility]  . Hypertension   . Hyperlipidemia   . Hyperthyroidism   . History of TIA (transient ischemic attack)    01/06/2011  . Pancreatitis   . Gout   . Depression   . Renal disorder     chronic kidney disease, stage 3  . Chronic pain   . Glaucoma   . Arthritis     djd -lower back, left knee  . Degenerative disc disease   . Degenerative disc disease   . Bipolar disorder   . Gastritis    Current Outpatient Prescriptions  Medication Sig Dispense Refill  . allopurinol (ZYLOPRIM) 300 MG tablet Take 300 mg by mouth daily.      Marland Kitchen amLODipine (NORVASC) 10 MG tablet Take 10 mg by mouth. Take 10 mg by mouth once daily.      Marland Kitchen aspirin EC 81 MG tablet Take 81 mg by mouth daily.      Marland Kitchen atorvastatin (LIPITOR) 80 MG tablet Take 1 tablet (80 mg total) by mouth daily at 6 PM.  30 tablet  1  . brimonidine (ALPHAGAN) 0.2 % ophthalmic solution Place 1 drop into both eyes daily. Administer 1 drop to both eyes once daily.  5 mL  0  . colchicine 0.6 MG tablet Take 0.6 mg by mouth daily as needed (for gout flare-ups).       . divalproex (DEPAKOTE ER) 500 MG 24 hr tablet Take 1,000 mg by mouth at bedtime.      . fenofibrate 54 MG tablet Take 54 mg by mouth. Take 54 mg by mouth once daily.      Marland Kitchen  fluticasone (FLOVENT HFA) 44 MCG/ACT inhaler Inhale 2 puffs into the lungs 2 (two) times daily.       Marland Kitchen gemfibrozil (LOPID) 600 MG tablet Take 1 tablet (600 mg total) by mouth 2 (two) times daily before a meal.  60 tablet  1  . hydrochlorothiazide (HYDRODIURIL) 25 MG tablet Take 1 tablet (25 mg total) by mouth daily. Take 1 tablet (25 mg total) by mouth daily.  30 tablet  0  . latanoprost (XALATAN) 0.005 % ophthalmic solution Place 1 drop into both eyes at bedtime.  2.5 mL  0  . levothyroxine (SYNTHROID, LEVOTHROID) 125 MCG tablet Take 250 mcg by mouth daily before breakfast.      . lisinopril (PRINIVIL,ZESTRIL) 40 MG tablet TAKE 1 TABLET BY MOUTH EVERY DAY FOR BLOOD PRESSURE  30 tablet  0  . nicotine (NICODERM CQ - DOSED IN MG/24 HOURS) 14 mg/24hr patch Place 1 patch onto the skin daily.      . nitroGLYCERIN (NITROSTAT) 0.4 MG  SL tablet Place 1 tablet (0.4 mg total) under the tongue every 5 (five) minutes as needed for chest pain.  30 tablet  3  . OLANZapine (ZYPREXA) 10 MG tablet Take 10 mg by mouth at bedtime.      . ondansetron (ZOFRAN-ODT) 4 MG disintegrating tablet Take 1 tablet (4 mg total) by mouth every 8 (eight) hours as needed for nausea.  20 tablet  0  . Oxycodone HCl 10 MG TABS Take 1 tablet (10 mg total) by mouth every 8 (eight) hours as needed.  90 tablet  0  . pantoprazole (PROTONIX) 40 MG tablet Take 1 tablet (40 mg total) by mouth daily.  30 tablet  5  . sertraline (ZOLOFT) 25 MG tablet Take 50 mg by mouth daily.      . sucralfate (CARAFATE) 1 GM/10ML suspension Take 10 mLs (1 g total) by mouth 4 (four) times daily.  420 mL  0  . traZODone (DESYREL) 100 MG tablet Take 100 mg by mouth at bedtime.      Marland Kitchen albuterol (PROVENTIL HFA;VENTOLIN HFA) 108 (90 BASE) MCG/ACT inhaler Inhale 2 puffs into the lungs every 6 (six) hours as needed for wheezing.  1 Inhaler  2  . Fluticasone-Salmeterol (ADVAIR DISKUS) 250-50 MCG/DOSE AEPB Inhale 1 puff into the lungs 2 times daily at 12 noon and 4 pm.  60 each  0  . insulin glargine (LANTUS) 100 units/mL SOLN Inject 20 Units into the skin daily at 10 pm.  10 mL  2  . Insulin Syringe-Needle U-100 31G X 15/64" 0.3 ML MISC 1 Package by Does not apply route daily.  100 each  12   No current facility-administered medications for this visit.   Family History  Problem Relation Age of Onset  . Arthritis Mother   . Diabetes Mother   . Hypertension Mother   . Kidney disease Mother   . Heart disease Mother     died 48  . Heart disease      mother & father  . Hypertension      mother & father  . Colon cancer Neg Hx   . Prostate cancer Neg Hx   . Breast cancer Neg Hx   . Hypertension Sister   . Hypertension Brother   . Kidney disease Brother    History   Social History  . Marital Status: Single    Spouse Name: N/A    Number of Children: N/A  . Years of Education:  N/A  Occupational History  . Disability    Social History Main Topics  . Smoking status: Current Every Day Smoker -- 0.01 packs/day for 30 years    Types: Cigarettes  . Smokeless tobacco: Never Used     Comment: 1 cigs/uses patches.  . Alcohol Use: No  . Drug Use: No  . Sexual Activity: None   Other Topics Concern  . None   Social History Narrative            Review of Systems: Constitutional: Denies fever, chills, diaphoresis, appetite change and fatigue.  Respiratory: Denies SOB, DOE, cough, chest tightness, and wheezing.  Cardiovascular: No chest pain, palpitations and leg swelling.  Gastrointestinal: chronic abdominal pain. Denies nausea, vomiting, bloody stools Genitourinary: No dysuria, frequency, hematuria, or flank pain.  Musculoskeletal: chronic low back pain.  Objective:  Physical Exam: Filed Vitals:   01/23/13 1334  BP: 171/90  Pulse: 84  Temp: 97.4 F (36.3 C)  TempSrc: Oral  Height: 5\' 2"  (1.575 m)  Weight: 192 lb 4.8 oz (87.227 kg)  SpO2: 99%   General: Well nourished. No acute distress. Uses a cane with antalgic gait Lungs: CTA bilaterally. Heart: RRR; no extra sounds or murmurs  Abdomen: Non-distended, normal BS, soft, nontender; no hepatosplenomegaly  Extremities: No pedal edema. No joint swelling or tenderness. Neurologic: Alert and oriented x3. No obvious neurologic deficits. Uses a cane with antalgic gait   Assessment & Plan:  I have discussed my assessment and plan  with Dr. Rogelia Boga  as detailed under problem based charting.

## 2013-01-23 NOTE — Patient Instructions (Signed)
General Instructions: Please starting taking Lantus 20 units daily  Please stop taking Glipizide  Please call clinic to get in touch with Lupita Leash in about 2 weeks  I will tell the nurse to fax your results to Dr Midge Aver I will give 90 pills of Oxycodone and we can not provide any more pain medication from this clinic. Be sure to keep you appointment with Dr Jordan Likes on 02/17/2013. He will be the one to manage your pain  Please come back in one month     Treatment Goals:  Goals (1 Years of Data) as of 01/23/13   None      Progress Toward Treatment Goals:  Treatment Goal 01/23/2013  Hemoglobin A1C improved  Blood pressure unchanged  Stop smoking smoking less  Prevent falls unable to assess    Self Care Goals & Plans:  Self Care Goal 01/23/2013  Manage my medications bring my medications to every visit; refill my medications on time  Monitor my health keep track of my blood glucose; bring my glucose meter and log to each visit; keep track of my blood pressure; bring my blood pressure log to each visit  Eat healthy foods -  Be physically active -  Stop smoking -  Prevent falls -  Meeting treatment goals -    Home Blood Glucose Monitoring 01/23/2013  Check my blood sugar 3 times a day     Care Management & Community Referrals:  Referral 01/23/2013  Referrals made for care management support diabetes educator  Referrals made to community resources -

## 2013-01-23 NOTE — Assessment & Plan Note (Signed)
Lab Results  Component Value Date   HGBA1C 8.8* 11/07/2012   HGBA1C 12.7* 07/19/2012   HGBA1C 7.9* 03/04/2012     Assessment: Diabetes control: fair control Progress toward A1C goal:  improved Comments: CBGs with average of 277. Patient requests to be switched to insulin. She was previously on Lantus 20 units daily. She reported had a lot of diarrhea from metformin.   Plan: Medications:  Discontinue glipizide. Started Lantus at 20 units once daily. Instructed the patient to turn in her meter in 2 weeks for readjustment of her insulin. Stopped Glipizide.  Home glucose monitoring: Frequency: 3 times a day Timing:   Instruction/counseling given: reminded to bring blood glucose meter & log to each visit Educational resources provided:   Self management tools provided: copy of home glucose meter download Other plans: Patient reports that she is travelling to [place] due to some family issues. She will come back towards end of Sept. I encouraged her to call Lupita Leash and report results from her glucose meter.

## 2013-01-23 NOTE — Assessment & Plan Note (Signed)
Patient not a candidate for chronic narcotic prescription from all clinic. Provided her with 90 pills of Oxycodone 10 mg until her appointment in the pain clinic. Appointment on 02/19/2013. I informed her that this is the last prescription of oxycodone she will get from clinic.

## 2013-01-24 LAB — PRESCRIPTION ABUSE MONITORING 15P, URINE
Amphetamine/Meth: NEGATIVE ng/mL
Barbiturate Screen, Urine: NEGATIVE ng/mL
Benzodiazepine Screen, Urine: NEGATIVE ng/mL
Buprenorphine, Urine: NEGATIVE ng/mL
Carisoprodol, Urine: NEGATIVE ng/mL
Meperidine, Ur: NEGATIVE ng/mL
Propoxyphene: NEGATIVE ng/mL
Tramadol Scrn, Ur: NEGATIVE ng/mL

## 2013-01-24 LAB — TSH: TSH: 56.238 u[IU]/mL — ABNORMAL HIGH (ref 0.350–4.500)

## 2013-01-24 LAB — VALPROIC ACID LEVEL: Valproic Acid Lvl: 54.4 ug/mL (ref 50.0–100.0)

## 2013-01-25 NOTE — Progress Notes (Signed)
Case discussed with Dr. Kazibwe soon after the resident saw the patient.  We reviewed the resident's history and exam and pertinent patient test results.  I agree with the assessment, diagnosis, and plan of care documented in the resident's note. 

## 2013-01-27 ENCOUNTER — Encounter: Payer: Self-pay | Admitting: Dietician

## 2013-01-27 ENCOUNTER — Encounter: Payer: Self-pay | Admitting: Internal Medicine

## 2013-02-02 ENCOUNTER — Ambulatory Visit (AMBULATORY_SURGERY_CENTER): Payer: PRIVATE HEALTH INSURANCE | Admitting: *Deleted

## 2013-02-02 VITALS — Ht 62.0 in | Wt 193.0 lb

## 2013-02-02 DIAGNOSIS — Z1211 Encounter for screening for malignant neoplasm of colon: Secondary | ICD-10-CM

## 2013-02-02 MED ORDER — MOVIPREP 100 G PO SOLR: ORAL | Status: DC

## 2013-02-02 NOTE — Progress Notes (Signed)
Patient denies any allergies to eggs or soy. Patient denies any problems with anesthesia.  

## 2013-02-05 ENCOUNTER — Encounter (HOSPITAL_COMMUNITY): Payer: Self-pay | Admitting: Emergency Medicine

## 2013-02-05 ENCOUNTER — Emergency Department (HOSPITAL_COMMUNITY)
Admission: EM | Admit: 2013-02-05 | Discharge: 2013-02-06 | Disposition: A | Discharge status: Home / Self Care | Payer: PRIVATE HEALTH INSURANCE | Attending: Emergency Medicine | Admitting: Emergency Medicine

## 2013-02-05 DIAGNOSIS — R739 Hyperglycemia, unspecified: Secondary | ICD-10-CM

## 2013-02-05 DIAGNOSIS — N183 Chronic kidney disease, stage 3 unspecified: Secondary | ICD-10-CM | POA: Insufficient documentation

## 2013-02-05 DIAGNOSIS — F17200 Nicotine dependence, unspecified, uncomplicated: Secondary | ICD-10-CM | POA: Insufficient documentation

## 2013-02-05 DIAGNOSIS — J45909 Unspecified asthma, uncomplicated: Secondary | ICD-10-CM | POA: Insufficient documentation

## 2013-02-05 DIAGNOSIS — Z862 Personal history of diseases of the blood and blood-forming organs and certain disorders involving the immune mechanism: Secondary | ICD-10-CM | POA: Insufficient documentation

## 2013-02-05 DIAGNOSIS — G8929 Other chronic pain: Secondary | ICD-10-CM | POA: Insufficient documentation

## 2013-02-05 DIAGNOSIS — E119 Type 2 diabetes mellitus without complications: Secondary | ICD-10-CM | POA: Insufficient documentation

## 2013-02-05 DIAGNOSIS — Z8719 Personal history of other diseases of the digestive system: Secondary | ICD-10-CM | POA: Insufficient documentation

## 2013-02-05 DIAGNOSIS — F3289 Other specified depressive episodes: Secondary | ICD-10-CM | POA: Insufficient documentation

## 2013-02-05 DIAGNOSIS — A599 Trichomoniasis, unspecified: Secondary | ICD-10-CM

## 2013-02-05 DIAGNOSIS — Z7982 Long term (current) use of aspirin: Secondary | ICD-10-CM | POA: Insufficient documentation

## 2013-02-05 DIAGNOSIS — Z8739 Personal history of other diseases of the musculoskeletal system and connective tissue: Secondary | ICD-10-CM | POA: Insufficient documentation

## 2013-02-05 DIAGNOSIS — F329 Major depressive disorder, single episode, unspecified: Secondary | ICD-10-CM | POA: Insufficient documentation

## 2013-02-05 DIAGNOSIS — J4489 Other specified chronic obstructive pulmonary disease: Secondary | ICD-10-CM | POA: Insufficient documentation

## 2013-02-05 DIAGNOSIS — R109 Unspecified abdominal pain: Secondary | ICD-10-CM | POA: Insufficient documentation

## 2013-02-05 DIAGNOSIS — Z8669 Personal history of other diseases of the nervous system and sense organs: Secondary | ICD-10-CM | POA: Insufficient documentation

## 2013-02-05 DIAGNOSIS — I129 Hypertensive chronic kidney disease with stage 1 through stage 4 chronic kidney disease, or unspecified chronic kidney disease: Secondary | ICD-10-CM | POA: Insufficient documentation

## 2013-02-05 DIAGNOSIS — Z794 Long term (current) use of insulin: Secondary | ICD-10-CM | POA: Insufficient documentation

## 2013-02-05 DIAGNOSIS — Z79899 Other long term (current) drug therapy: Secondary | ICD-10-CM | POA: Insufficient documentation

## 2013-02-05 DIAGNOSIS — E785 Hyperlipidemia, unspecified: Secondary | ICD-10-CM | POA: Insufficient documentation

## 2013-02-05 DIAGNOSIS — J449 Chronic obstructive pulmonary disease, unspecified: Secondary | ICD-10-CM | POA: Insufficient documentation

## 2013-02-05 DIAGNOSIS — F209 Schizophrenia, unspecified: Secondary | ICD-10-CM | POA: Insufficient documentation

## 2013-02-05 DIAGNOSIS — E0590 Thyrotoxicosis, unspecified without thyrotoxic crisis or storm: Secondary | ICD-10-CM | POA: Insufficient documentation

## 2013-02-05 DIAGNOSIS — Z8673 Personal history of transient ischemic attack (TIA), and cerebral infarction without residual deficits: Secondary | ICD-10-CM | POA: Insufficient documentation

## 2013-02-05 DIAGNOSIS — A5901 Trichomonal vulvovaginitis: Secondary | ICD-10-CM | POA: Insufficient documentation

## 2013-02-05 DIAGNOSIS — Z8639 Personal history of other endocrine, nutritional and metabolic disease: Secondary | ICD-10-CM | POA: Insufficient documentation

## 2013-02-05 HISTORY — DX: Chronic obstructive pulmonary disease, unspecified: J44.9

## 2013-02-05 HISTORY — DX: Schizophrenia, unspecified: F20.9

## 2013-02-05 LAB — URINE MICROSCOPIC-ADD ON

## 2013-02-05 LAB — BASIC METABOLIC PANEL
CO2: 30 mEq/L (ref 19–32)
Chloride: 95 mEq/L — ABNORMAL LOW (ref 96–112)
GFR calc Af Amer: 53 mL/min — ABNORMAL LOW (ref >90)
Potassium: 4.4 mEq/L (ref 3.5–5.1)
Sodium: 133 mEq/L — ABNORMAL LOW (ref 135–145)

## 2013-02-05 LAB — CBC
MCV: 84 fL (ref 78.0–100.0)
Platelets: 347 10*3/uL (ref 150–400)
RBC: 4.93 MIL/uL (ref 3.87–5.11)
RDW: 15.5 % (ref 11.5–15.5)
WBC: 6.8 10*3/uL (ref 4.0–10.5)

## 2013-02-05 LAB — URINALYSIS, ROUTINE W REFLEX MICROSCOPIC
Glucose, UA: >1000 mg/dL — AB
Ketones, ur: NEGATIVE mg/dL
Nitrite: NEGATIVE
Specific Gravity, Urine: 1.032 — ABNORMAL HIGH (ref 1.005–1.030)
pH: 6.5 (ref 5.0–8.0)

## 2013-02-05 MED ORDER — INSULIN ASPART 100 UNIT/ML ~~LOC~~ SOLN
10.0000 [IU] | Freq: Once | SUBCUTANEOUS | Status: AC
  Administered 2013-02-06: 10 [IU] via SUBCUTANEOUS
  Filled 2013-02-05: qty 1

## 2013-02-05 MED ORDER — SODIUM CHLORIDE 0.9 % IV BOLUS (SEPSIS)
1000.0000 mL | Freq: Once | INTRAVENOUS | Status: AC
  Administered 2013-02-06: 1000 mL via INTRAVENOUS

## 2013-02-05 MED ORDER — OXYCODONE HCL ER 10 MG PO T12A
10.0000 mg | EXTENDED_RELEASE_TABLET | Freq: Two times a day (BID) | ORAL | Status: DC
  Administered 2013-02-06: 10 mg via ORAL
  Filled 2013-02-05: qty 1

## 2013-02-05 MED ORDER — SODIUM CHLORIDE 0.9 % IV SOLN
INTRAVENOUS | Status: DC
  Administered 2013-02-06 (×2): via INTRAVENOUS

## 2013-02-05 NOTE — ED Notes (Signed)
Pt reports that she is a diabetic with a hx of yeast infections, has been having vaginal discharge with itching and lower abdominal pain. Pt reports that she has changed soap and is unsure of the cause of the discharge, but it is worse than her usual yeast infections.

## 2013-02-05 NOTE — ED Provider Notes (Signed)
CSN: [id number]     Arrival date & time 02/05/13  1938 History   First MD Initiated Contact with Patient 02/05/13 2303     Chief Complaint  Patient presents with  . Vaginal Discharge  . Abdominal Pain   (Consider location/radiation/quality/duration/timing/severity/associated sxs/prior Treatment) Patient is a 57 y.o. female presenting with vaginal discharge and abdominal pain. The history is provided by the patient.  Vaginal Discharge Associated symptoms: abdominal pain   Abdominal Pain Associated symptoms: vaginal discharge   pt with vag itching x 2 days, now with discharge similar to to prior yeast infections--pt also notes polyuria and polydipsia--h/o niddm and non-compliant with diabetic diet--no fever or chills, some dizziness without diarrhea or vomiting--some suprapubic and flank pain noted  Past Medical History  Diagnosis Date  . Asthma     past 10 years  . Diabetes mellitus     past 5 years  . Glaucoma     dx 06/2011-[facility]  . Hypertension   . Hyperlipidemia   . Hyperthyroidism   . History of TIA (transient ischemic attack)     01/06/2011, x2  . Pancreatitis   . Gout   . Depression   . Renal disorder     chronic kidney disease, stage 3  . Chronic pain   . Glaucoma   . Arthritis     djd -lower back, left knee  . Degenerative disc disease   . Degenerative disc disease   . Bipolar disorder   . Gastritis   . COPD (chronic obstructive pulmonary disease)   . Schizophrenia    Past Surgical History  Procedure Laterality Date  . Vaginal hysterectomy  1990  . Abdominal hysterectomy      still has ovaries    Family History  Problem Relation Age of Onset  . Arthritis Mother   . Diabetes Mother   . Hypertension Mother   . Kidney disease Mother   . Heart disease Mother     died 64  . Heart disease      mother & father  . Hypertension      mother & father  . Colon cancer Neg Hx   . Prostate cancer Neg Hx   . Breast cancer Neg Hx   . Stomach  cancer Neg Hx   . Hypertension Sister   . Hypertension Brother   . Kidney disease Brother    History  Substance Use Topics  . Smoking status: Current Every Day Smoker -- 0.01 packs/day for 30 years    Types: Cigarettes  . Smokeless tobacco: Never Used     Comment: smokes 2 cigs daily/uses patches.  . Alcohol Use: No   OB History   Grav Para Term Preterm Abortions TAB SAB Ect Mult Living                 Review of Systems  Gastrointestinal: Positive for abdominal pain.  Genitourinary: Positive for vaginal discharge.  All other systems reviewed and are negative.    Allergies  Hydrocodone-acetaminophen; Metformin; Metformin and related; and Vicodin  Home Medications   Current Outpatient Rx  Name  Route  Sig  Dispense  Refill  . albuterol (PROVENTIL HFA;VENTOLIN HFA) 108 (90 BASE) MCG/ACT inhaler   Inhalation   Inhale 2 puffs into the lungs every 6 (six) hours as needed for wheezing.   1 Inhaler   2   . allopurinol (ZYLOPRIM) 300 MG tablet   Oral   Take 300 mg by mouth daily.         Marland Kitchen  aspirin EC 81 MG tablet   Oral   Take 81 mg by mouth daily.         Marland Kitchen atorvastatin (LIPITOR) 80 MG tablet   Oral   Take 1 tablet (80 mg total) by mouth daily at 6 PM.   30 tablet   1   . brimonidine (ALPHAGAN) 0.2 % ophthalmic solution   Both Eyes   Place 1 drop into both eyes 2 (two) times daily.         . colchicine 0.6 MG tablet   Oral   Take 0.6 mg by mouth daily as needed (for gout flare-ups).          . diazepam (VALIUM) 5 MG tablet   Oral   Take 5 mg by mouth every 12 (twelve) hours as needed for anxiety.         . divalproex (DEPAKOTE ER) 500 MG 24 hr tablet   Oral   Take 1,000 mg by mouth at bedtime.         . fenofibrate 54 MG tablet   Oral   Take 54 mg by mouth daily.          . fluticasone (FLOVENT HFA) 44 MCG/ACT inhaler   Inhalation   Inhale 2 puffs into the lungs 2 (two) times daily.          . Fluticasone-Salmeterol (ADVAIR DISKUS)  250-50 MCG/DOSE AEPB   Inhalation   Inhale 1 puff into the lungs 2 times daily at 12 noon and 4 pm.   60 each   0   . gemfibrozil (LOPID) 600 MG tablet   Oral   Take 1 tablet (600 mg total) by mouth 2 (two) times daily before a meal.   60 tablet   1   . glipiZIDE (GLUCOTROL) 10 MG tablet   Oral   Take 10 mg by mouth daily.         . hydrochlorothiazide (HYDRODIURIL) 25 MG tablet   Oral   Take 1 tablet (25 mg total) by mouth daily. Take 1 tablet (25 mg total) by mouth daily.   30 tablet   0   . latanoprost (XALATAN) 0.005 % ophthalmic solution   Both Eyes   Place 1 drop into both eyes at bedtime.   2.5 mL   0   . levothyroxine (SYNTHROID, LEVOTHROID) 125 MCG tablet   Oral   Take 250 mcg by mouth daily before breakfast.         . lisinopril (PRINIVIL,ZESTRIL) 40 MG tablet   Oral   Take 40 mg by mouth daily.         . nicotine (NICODERM CQ - DOSED IN MG/24 HOURS) 14 mg/24hr patch   Transdermal   Place 1 patch onto the skin daily.         . nitroGLYCERIN (NITROSTAT) 0.4 MG SL tablet   Sublingual   Place 1 tablet (0.4 mg total) under the tongue every 5 (five) minutes as needed for chest pain.   30 tablet   3   . OLANZapine (ZYPREXA) 10 MG tablet   Oral   Take 10 mg by mouth at bedtime.         . ondansetron (ZOFRAN-ODT) 4 MG disintegrating tablet   Oral   Take 1 tablet (4 mg total) by mouth every 8 (eight) hours as needed for nausea.   20 tablet   0   . Oxycodone HCl 10 MG TABS   Oral   Take 10 mg  by mouth every 6 (six) hours as needed (for pain).         . pantoprazole (PROTONIX) 40 MG tablet   Oral   Take 1 tablet (40 mg total) by mouth daily.   30 tablet   5   . sertraline (ZOLOFT) 25 MG tablet   Oral   Take 100 mg by mouth daily.          . sucralfate (CARAFATE) 1 GM/10ML suspension   Oral   Take 10 mLs (1 g total) by mouth 4 (four) times daily.   420 mL   0   . traZODone (DESYREL) 100 MG tablet   Oral   Take 100 mg by mouth  at bedtime.         . insulin glargine (LANTUS) 100 UNIT/ML injection   Subcutaneous   Inject 20 Units into the skin at bedtime.         . Insulin Syringe-Needle U-100 31G X 15/64" 0.3 ML MISC   Does not apply   1 Package by Does not apply route daily.   100 each   12    BP 160/92  Pulse 75  Temp(Src) 97.7 F (36.5 C) (Oral)  Resp 20  SpO2 96% Physical Exam  Nursing note and vitals reviewed. Constitutional: She is oriented to person, place, and time. She appears well-developed and well-nourished.  Non-toxic appearance. No distress.  HENT:  Head: Normocephalic and atraumatic.  Eyes: Conjunctivae, EOM and lids are normal. Pupils are equal, round, and reactive to light.  Neck: Normal range of motion. Neck supple. No tracheal deviation present. No mass present.  Cardiovascular: Normal rate, regular rhythm and normal heart sounds.  Exam reveals no gallop.   No murmur heard. Pulmonary/Chest: Effort normal and breath sounds normal. No stridor. No respiratory distress. She has no decreased breath sounds. She has no wheezes. She has no rhonchi. She has no rales.  Abdominal: Soft. Normal appearance and bowel sounds are normal. She exhibits no distension. There is tenderness in the suprapubic area. There is no rigidity, no rebound, no guarding and no CVA tenderness.  Genitourinary: Vaginal discharge found.  Musculoskeletal: Normal range of motion. She exhibits no edema and no tenderness.  Neurological: She is alert and oriented to person, place, and time. She has normal strength. No cranial nerve deficit or sensory deficit. GCS eye subscore is 4. GCS verbal subscore is 5. GCS motor subscore is 6.  Skin: Skin is warm and dry. No abrasion and no rash noted.  Psychiatric: She has a normal mood and affect. Her speech is normal and behavior is normal.    ED Course  Procedures (including critical care time) Labs Review Labs Reviewed  BASIC METABOLIC PANEL - Abnormal; Notable for the  following:    Sodium 133 (*)    Chloride 95 (*)    Glucose, Bld 461 (*)    Creatinine, Ser 1.30 (*)    GFR calc non Af Amer 45 (*)    GFR calc Af Amer 53 (*)    All other components within normal limits  URINALYSIS, ROUTINE W REFLEX MICROSCOPIC - Abnormal; Notable for the following:    APPearance CLOUDY (*)    Specific Gravity, Urine 1.032 (*)    Glucose, UA >1000 (*)    Hgb urine dipstick MODERATE (*)    Leukocytes, UA MODERATE (*)    All other components within normal limits  URINE MICROSCOPIC-ADD ON - Abnormal; Notable for the following:    Squamous Epithelial /  LPF FEW (*)    Bacteria, UA MANY (*)    All other components within normal limits  URINE CULTURE  WET PREP, GENITAL  GC/CHLAMYDIA PROBE AMP  CBC   Imaging Review No results found.  MDM  No diagnosis found. Patient given insulin 10 units subcutaneous. Patient also given IV fluids 2 L of saline. Repeat blood sugars have improved. She has no signs of DKA at this time. She was given medications for treatment of possible STD. Patient will also be treated for Trichomonas as well.   Toy Baker, MD 02/06/13 [phone]

## 2013-02-06 LAB — WET PREP, GENITAL
Clue Cells Wet Prep HPF POC: NONE SEEN
Yeast Wet Prep HPF POC: NONE SEEN

## 2013-02-06 LAB — URINE CULTURE: Colony Count: NO GROWTH

## 2013-02-06 LAB — GLUCOSE, CAPILLARY
Glucose-Capillary: 382 mg/dL — ABNORMAL HIGH (ref 70–99)
Glucose-Capillary: 285 mg/dL — ABNORMAL HIGH (ref 70–99)

## 2013-02-06 MED ORDER — AZITHROMYCIN 1 G PO PACK
1.0000 g | PACK | Freq: Once | ORAL | Status: AC
  Administered 2013-02-06: 1 g via ORAL
  Filled 2013-02-06: qty 1

## 2013-02-06 MED ORDER — SODIUM CHLORIDE 0.9 % IV BOLUS (SEPSIS)
1000.0000 mL | Freq: Once | INTRAVENOUS | Status: AC
  Administered 2013-02-06: 1000 mL via INTRAVENOUS

## 2013-02-06 MED ORDER — DEXTROSE 5 % IV SOLN: 1.0000 g | INTRAVENOUS | Status: DC

## 2013-02-06 MED ORDER — METRONIDAZOLE 500 MG PO TABS
2000.0000 mg | ORAL_TABLET | Freq: Once | ORAL | Status: AC
  Administered 2013-02-06: 2000 mg via ORAL
  Filled 2013-02-06: qty 4

## 2013-02-06 MED ORDER — CEFTRIAXONE SODIUM 250 MG IJ SOLR
250.0000 mg | Freq: Once | INTRAMUSCULAR | Status: AC
  Administered 2013-02-06: 250 mg via INTRAMUSCULAR
  Filled 2013-02-06: qty 250

## 2013-02-06 NOTE — ED Notes (Signed)
CBG is 382 (at 0127).  This was after 1st 1L bolus had infused.  Second liter is infusing at this time

## 2013-02-10 ENCOUNTER — Encounter: Payer: Self-pay | Admitting: Internal Medicine

## 2013-02-10 ENCOUNTER — Ambulatory Visit (AMBULATORY_SURGERY_CENTER): Payer: PRIVATE HEALTH INSURANCE | Admitting: Internal Medicine

## 2013-02-10 VITALS — BP 142/95 | HR 67 | Temp 96.2°F | Resp 23 | Ht 62.0 in | Wt 193.0 lb

## 2013-02-10 DIAGNOSIS — D126 Benign neoplasm of colon, unspecified: Secondary | ICD-10-CM

## 2013-02-10 DIAGNOSIS — Z1211 Encounter for screening for malignant neoplasm of colon: Secondary | ICD-10-CM

## 2013-02-10 LAB — GLUCOSE, CAPILLARY: Glucose-Capillary: 186 mg/dL — ABNORMAL HIGH (ref 70–99)

## 2013-02-10 MED ORDER — SODIUM CHLORIDE 0.9 % IV SOLN: 500.0000 mL | INTRAVENOUS | Status: DC

## 2013-02-10 NOTE — Patient Instructions (Addendum)
I found and removed 2 tiny polyps that look benign.   I will let you know pathology results and when to have another routine colonoscopy by mail.  I appreciate the opportunity to care for you. Iva Boop, MD, FACG  YOU HAD AN ENDOSCOPIC PROCEDURE TODAY AT THE [facility]: Refer to the procedure report that was given to you for any specific questions about what was found during the examination.  If the procedure report does not answer your questions, please call your gastroenterologist to clarify.  If you requested that your care partner not be given the details of your procedure findings, then the procedure report has been included in a sealed envelope for you to review at your convenience later.  YOU SHOULD EXPECT: Some feelings of bloating in the abdomen. Passage of more gas than usual.  Walking can help get rid of the air that was put into your GI tract during the procedure and reduce the bloating. If you had a lower endoscopy (such as a colonoscopy or flexible sigmoidoscopy) you may notice spotting of blood in your stool or on the toilet paper. If you underwent a bowel prep for your procedure, then you may not have a normal bowel movement for a few days.  DIET: Your first meal following the procedure should be a light meal and then it is ok to progress to your normal diet.  A half-sandwich or bowl of soup is an example of a good first meal.  Heavy or fried foods are harder to digest and may make you feel nauseous or bloated.  Likewise meals heavy in dairy and vegetables can cause extra gas to form and this can also increase the bloating.  Drink plenty of fluids but you should avoid alcoholic beverages for 24 hours.  ACTIVITY: Your care partner should take you home directly after the procedure.  You should plan to take it easy, moving slowly for the rest of the day.  You can resume normal activity the day after the procedure however you should NOT DRIVE or use heavy machinery for 24  hours (because of the sedation medicines used during the test).    SYMPTOMS TO REPORT IMMEDIATELY: A gastroenterologist can be reached at any hour.  During normal business hours, 8:30 AM to 5:00 PM Monday through Friday, call [phone].  After hours and on weekends, please call the GI answering service at [phone] who will take a message and have the physician on call contact you.   Following lower endoscopy (colonoscopy or flexible sigmoidoscopy):  Excessive amounts of blood in the stool  Significant tenderness or worsening of abdominal pains  Swelling of the abdomen that is new, acute  Fever of 100F or higher  FOLLOW UP: If any biopsies were taken you will be contacted by phone or by letter within the next 1-3 weeks.  Call your gastroenterologist if you have not heard about the biopsies in 3 weeks.  Our staff will call the home number listed on your records the next business day following your procedure to check on you and address any questions or concerns that you may have at that time regarding the information given to you following your procedure. This is a courtesy call and so if there is no answer at the home number and we have not heard from you through the emergency physician on call, we will assume that you have returned to your regular daily activities without incident.  SIGNATURES/CONFIDENTIALITY: You and/or your care partner  have signed paperwork which will be entered into your electronic medical record.  These signatures attest to the fact that that the information above on your After Visit Summary has been reviewed and is understood.  Full responsibility of the confidentiality of this discharge information lies with you and/or your care-partner.

## 2013-02-10 NOTE — Progress Notes (Signed)
Called to room to assist during endoscopic procedure.  Patient ID and intended procedure confirmed with present staff. Received instructions for my participation in the procedure from the performing physician.  

## 2013-02-10 NOTE — Op Note (Signed)
[facility] [address] [place], [address]   COLONOSCOPY PROCEDURE REPORT  PATIENT: [name], [name]  MR#: [id number] BIRTHDATE: 08/01/1955 , 57  yrs. old GENDER: Female ENDOSCOPIST: Iva Boop, MD, [facility] REFERRED BY:   Desma Maxim, MD PROCEDURE DATE:  02/10/2013 PROCEDURE:   Colonoscopy with snare polypectomy First Screening Colonoscopy - Avg.  risk and is 50 yrs.  old or older Yes.  Prior Negative Screening - Now for repeat screening. N/A  History of Adenoma - Now for follow-up colonoscopy & has been > or = to 3 yrs.  N/A  Polyps Removed Today? Yes. ASA CLASS:   Class III INDICATIONS:average risk screening and first colonoscopy. MEDICATIONS: Propofol (Diprivan) 220 mg IV, MAC sedation, administered by CRNA, and These medications were titrated to patient response per physician's verbal order  DESCRIPTION OF PROCEDURE:   After the risks benefits and alternatives of the procedure were thoroughly explained, informed consent was obtained.  A digital rectal exam revealed no abnormalities of the rectum.   The LB [id number]  endoscope was introduced through the anus and advanced to the cecum, which was identified by both the appendix and ileocecal valve. No adverse events experienced.   The quality of the prep was excellent using Suprep  The instrument was then slowly withdrawn as the colon was fully examined.   COLON FINDINGS: Two sessile polyps measuring 2 and 5 mm in size were found in the ascending colon and descending colon.  A polypectomy was performed with cold forceps and with a cold snare.  The resection was complete and the polyp tissue was completely retrieved.   The colon mucosa was otherwise normal.   A right colon retroflexion was performed.  Retroflexed views revealed no abnormalities. The time to cecum=4 minutes 14 seconds.  Withdrawal time=11 minutes 15 seconds.  The scope was withdrawn and the procedure completed. COMPLICATIONS: There  were no complications.  ENDOSCOPIC IMPRESSION: 1.   Two sessile polyps measuring 2 and 5 mm in size were found in the ascending colon and descending colon; polypectomy was performed with cold forceps and with a cold snare 2.   The colon mucosa was otherwise normal - excellent prep - first colonoscopy  RECOMMENDATIONS: Timing of repeat colonoscopy will be determined by pathology findings.   eSigned:  Iva Boop, MD, [facility] 02/10/2013 10:53 AM   cc: The Patient    and Desma Maxim, MD

## 2013-02-10 NOTE — Progress Notes (Signed)
Patient did not have preoperative order for IV antibiotic SSI prophylaxis. (G8918)  Patient did not experience any of the following events: a burn prior to discharge; a fall within the facility; wrong site/side/patient/procedure/implant event; or a hospital transfer or hospital admission upon discharge from the facility. (G8907)  

## 2013-02-10 NOTE — Progress Notes (Signed)
Report to pacu rn, vss, bbs=clear 

## 2013-02-11 ENCOUNTER — Telehealth: Payer: Self-pay | Admitting: *Deleted

## 2013-02-11 NOTE — Telephone Encounter (Signed)
  Follow up Call-  Call back number 02/10/2013  Post procedure Call Back phone  # [phone]  Permission to leave phone message Yes     Patient questions:  Do you have a fever, pain , or abdominal swelling? no Pain Score  0 *  Have you tolerated food without any problems? yes  Have you been able to return to your normal activities? yes  Do you have any questions about your discharge instructions: Diet   no Medications  no Follow up visit  no  Do you have questions or concerns about your Care? no  Actions: * If pain score is 4 or above: No action needed, pain <4.

## 2013-02-12 ENCOUNTER — Ambulatory Visit (HOSPITAL_BASED_OUTPATIENT_CLINIC_OR_DEPARTMENT_OTHER): Payer: PRIVATE HEALTH INSURANCE | Attending: Internal Medicine | Admitting: Radiology

## 2013-02-12 DIAGNOSIS — G4710 Hypersomnia, unspecified: Secondary | ICD-10-CM | POA: Insufficient documentation

## 2013-02-14 DIAGNOSIS — G4710 Hypersomnia, unspecified: Secondary | ICD-10-CM

## 2013-02-14 DIAGNOSIS — G4730 Sleep apnea, unspecified: Secondary | ICD-10-CM

## 2013-02-14 NOTE — Procedures (Signed)
NAMESHAMYA, [name]                 ACCOUNT NO.:  [id number]  MEDICAL RECORD NO.:  [id number]          PATIENT TYPE:  OUT  LOCATION:  SLEEP CENTER                 FACILITY:  [facility]  PHYSICIAN:  Clinton D. Maple Hudson, MD, FCCP, FACPDATE OF BIRTH:  05/07/1958  DATE OF STUDY:  02/12/2013                           NOCTURNAL POLYSOMNOGRAM  REFERRING PHYSICIAN:  Blanch Media, M.D.  INDICATION FOR STUDY:  Hypersomnia with sleep apnea.  EPWORTH SLEEPINESS SCORE:  6/24.  BMI 35.5, weight 194 pounds, height 62 inches, neck 14 inches.  MEDICATIONS:  Home medications are charted for review.  SLEEP ARCHITECTURE:  Total sleep time 385.5 minutes with sleep efficiency 94.3%.  Stage I was 4.9%, stage II 95.1%, stage III and REM were absent.  Sleep latency 19 minutes.  Awake after sleep onset 3.5 minutes.  Arousal index 16.  BEDTIME MEDICATION:  Divalproex, Lantus, Xalatan.  RESPIRATORY DATA:  Apnea-hypopnea index (AHI) 4.2 per hour.  A total of 27 events was scored including 2 obstructive apneas, 18 central apneas, 7 hypopneas.  The events were not positional.  REM AHI 0.  There were not enough events to meet split protocol CPAP titration requirements.  OXYGEN DATA:  Moderate snoring with oxygen desaturation to a nadir of 90% and mean oxygen saturation through the study of 95.1% on room air.  CARDIAC DATA:  Sinus rhythm with occasional PAC.  MOVEMENT-PARASOMNIA:  Occasional limb jerk with little effect on sleep. No bathroom trips.  IMPRESSIONS-RECOMMENDATIONS: 1. Sleep architecture was significant for medicated pattern, with     absence of stages III and REM.  Bedtime medications were taken as     above. 2. Occasional respiratory event with sleep disturbance, within normal     limits.  AHI 4.2 per hour (the normal AHI     range for adults is from 0-5 events per hour).  Moderate snoring     with oxygen desaturation to a nadir of 90% and mean oxygen     saturation through the study of 95.1%  on room air.     Clinton D. Maple Hudson, MD, [facility], FACP Diplomate, American Board of Sleep Medicine    CDY/MEDQ  D:  02/14/2013 11:42:58  T:  02/14/2013 12:30:32  Job:  [id number]

## 2013-02-15 ENCOUNTER — Encounter: Payer: Self-pay | Admitting: Internal Medicine

## 2013-02-15 NOTE — Progress Notes (Signed)
Patient ID: [name], female   DOB: Jul 16, 1955, 57 y.o.   MRN: [id number] Patient had sleep study done to evaluate for OSA. Results are as follows:  Sleep study 02/12/2013: IMPRESSIONS-RECOMMENDATIONS:  1. Sleep architecture was significant for medicated pattern, with  absence of stages III and REM. Bedtime medications were taken as  above.  2. Occasional respiratory event with sleep disturbance, within normal  limits. AHI 4.2 per hour (the normal AHI  range for adults is from 0-5 events per hour). Moderate snoring  with oxygen desaturation to a nadir of 90% and mean oxygen  saturation through the study of 95.1% on room air.  Patient does not appear to have sleep apnea.

## 2013-02-17 ENCOUNTER — Encounter: Payer: Self-pay | Admitting: Internal Medicine

## 2013-02-18 ENCOUNTER — Encounter: Payer: Self-pay | Admitting: Internal Medicine

## 2013-02-18 DIAGNOSIS — Z860100 Personal history of colon polyps, unspecified: Secondary | ICD-10-CM | POA: Insufficient documentation

## 2013-02-18 HISTORY — DX: Personal history of colonic polyps: Z86.010

## 2013-02-18 HISTORY — DX: Personal history of colon polyps, unspecified: Z86.0100

## 2013-02-18 NOTE — Progress Notes (Signed)
Quick Note:  diminutive adenoma and diminutive sessile serrated adenoma Repeat colon 2019 ______

## 2013-02-22 ENCOUNTER — Inpatient Hospital Stay (HOSPITAL_COMMUNITY)
Admission: EM | Admit: 2013-02-22 | Discharge: 2013-02-25 | DRG: 440 | Disposition: A | Discharge status: Home / Self Care | Payer: PRIVATE HEALTH INSURANCE | Attending: Internal Medicine | Admitting: Internal Medicine

## 2013-02-22 ENCOUNTER — Encounter (HOSPITAL_COMMUNITY): Payer: Self-pay | Admitting: *Deleted

## 2013-02-22 DIAGNOSIS — F209 Schizophrenia, unspecified: Secondary | ICD-10-CM | POA: Diagnosis present

## 2013-02-22 DIAGNOSIS — F17200 Nicotine dependence, unspecified, uncomplicated: Secondary | ICD-10-CM | POA: Diagnosis present

## 2013-02-22 DIAGNOSIS — J449 Chronic obstructive pulmonary disease, unspecified: Secondary | ICD-10-CM | POA: Diagnosis present

## 2013-02-22 DIAGNOSIS — E1149 Type 2 diabetes mellitus with other diabetic neurological complication: Secondary | ICD-10-CM | POA: Diagnosis present

## 2013-02-22 DIAGNOSIS — N183 Chronic kidney disease, stage 3 unspecified: Secondary | ICD-10-CM | POA: Diagnosis present

## 2013-02-22 DIAGNOSIS — E119 Type 2 diabetes mellitus without complications: Secondary | ICD-10-CM

## 2013-02-22 DIAGNOSIS — Z8673 Personal history of transient ischemic attack (TIA), and cerebral infarction without residual deficits: Secondary | ICD-10-CM

## 2013-02-22 DIAGNOSIS — G609 Hereditary and idiopathic neuropathy, unspecified: Secondary | ICD-10-CM | POA: Diagnosis present

## 2013-02-22 DIAGNOSIS — R110 Nausea: Secondary | ICD-10-CM

## 2013-02-22 DIAGNOSIS — K861 Other chronic pancreatitis: Secondary | ICD-10-CM | POA: Diagnosis present

## 2013-02-22 DIAGNOSIS — K859 Acute pancreatitis without necrosis or infection, unspecified: Principal | ICD-10-CM

## 2013-02-22 DIAGNOSIS — M109 Gout, unspecified: Secondary | ICD-10-CM | POA: Diagnosis present

## 2013-02-22 DIAGNOSIS — I10 Essential (primary) hypertension: Secondary | ICD-10-CM | POA: Diagnosis present

## 2013-02-22 DIAGNOSIS — M129 Arthropathy, unspecified: Secondary | ICD-10-CM | POA: Diagnosis present

## 2013-02-22 DIAGNOSIS — E785 Hyperlipidemia, unspecified: Secondary | ICD-10-CM | POA: Diagnosis present

## 2013-02-22 DIAGNOSIS — J4489 Other specified chronic obstructive pulmonary disease: Secondary | ICD-10-CM | POA: Diagnosis present

## 2013-02-22 DIAGNOSIS — R739 Hyperglycemia, unspecified: Secondary | ICD-10-CM

## 2013-02-22 DIAGNOSIS — Z720 Tobacco use: Secondary | ICD-10-CM

## 2013-02-22 DIAGNOSIS — E0590 Thyrotoxicosis, unspecified without thyrotoxic crisis or storm: Secondary | ICD-10-CM | POA: Diagnosis present

## 2013-02-22 DIAGNOSIS — K219 Gastro-esophageal reflux disease without esophagitis: Secondary | ICD-10-CM

## 2013-02-22 DIAGNOSIS — F319 Bipolar disorder, unspecified: Secondary | ICD-10-CM | POA: Diagnosis present

## 2013-02-22 DIAGNOSIS — E039 Hypothyroidism, unspecified: Secondary | ICD-10-CM | POA: Diagnosis present

## 2013-02-22 DIAGNOSIS — H409 Unspecified glaucoma: Secondary | ICD-10-CM | POA: Diagnosis present

## 2013-02-22 DIAGNOSIS — I129 Hypertensive chronic kidney disease with stage 1 through stage 4 chronic kidney disease, or unspecified chronic kidney disease: Secondary | ICD-10-CM | POA: Diagnosis present

## 2013-02-22 DIAGNOSIS — G8929 Other chronic pain: Secondary | ICD-10-CM | POA: Diagnosis present

## 2013-02-22 DIAGNOSIS — M47817 Spondylosis without myelopathy or radiculopathy, lumbosacral region: Secondary | ICD-10-CM | POA: Diagnosis present

## 2013-02-22 DIAGNOSIS — M549 Dorsalgia, unspecified: Secondary | ICD-10-CM | POA: Diagnosis present

## 2013-02-22 HISTORY — DX: Fatty (change of) liver, not elsewhere classified: K76.0

## 2013-02-22 NOTE — ED Notes (Signed)
Pt to ER via EMS for c/o abd pain; pt with hx of pancreatitis; pt reports last "flare up" was about 2-3 months ago; pt states that pain is RUQ radiating to back, pt c/o nausea

## 2013-02-23 ENCOUNTER — Encounter (HOSPITAL_COMMUNITY): Payer: Self-pay | Admitting: Internal Medicine

## 2013-02-23 DIAGNOSIS — I10 Essential (primary) hypertension: Secondary | ICD-10-CM

## 2013-02-23 DIAGNOSIS — K859 Acute pancreatitis without necrosis or infection, unspecified: Principal | ICD-10-CM

## 2013-02-23 DIAGNOSIS — E119 Type 2 diabetes mellitus without complications: Secondary | ICD-10-CM

## 2013-02-23 DIAGNOSIS — E039 Hypothyroidism, unspecified: Secondary | ICD-10-CM

## 2013-02-23 DIAGNOSIS — R739 Hyperglycemia, unspecified: Secondary | ICD-10-CM | POA: Diagnosis present

## 2013-02-23 LAB — COMPREHENSIVE METABOLIC PANEL
ALT: 11 U/L (ref 0–35)
AST: 14 U/L (ref 0–37)
Alkaline Phosphatase: 120 U/L — ABNORMAL HIGH (ref 39–117)
CO2: 26 mEq/L (ref 19–32)
Calcium: 9.8 mg/dL (ref 8.4–10.5)
Creatinine, Ser: 1.09 mg/dL (ref 0.50–1.10)
GFR calc Af Amer: 65 mL/min — ABNORMAL LOW (ref >90)
GFR calc non Af Amer: 56 mL/min — ABNORMAL LOW (ref >90)
Glucose, Bld: 316 mg/dL — ABNORMAL HIGH (ref 70–99)
Potassium: 3.7 mEq/L (ref 3.5–5.1)
Sodium: 135 mEq/L (ref 135–145)

## 2013-02-23 LAB — GLUCOSE, CAPILLARY
Glucose-Capillary: 123 mg/dL — ABNORMAL HIGH (ref 70–99)
Glucose-Capillary: 143 mg/dL — ABNORMAL HIGH (ref 70–99)
Glucose-Capillary: 240 mg/dL — ABNORMAL HIGH (ref 70–99)
Glucose-Capillary: 292 mg/dL — ABNORMAL HIGH (ref 70–99)

## 2013-02-23 LAB — CBC WITH DIFFERENTIAL/PLATELET
Basophils Absolute: 0 10*3/uL (ref 0.0–0.1)
Eosinophils Relative: 1 % (ref 0–5)
Lymphocytes Relative: 31 % (ref 12–46)
Lymphs Abs: 2.5 10*3/uL (ref 0.7–4.0)
MCV: 83.7 fL (ref 78.0–100.0)
Monocytes Relative: 10 % (ref 3–12)
Neutro Abs: 4.8 10*3/uL (ref 1.7–7.7)
Neutrophils Relative %: 59 % (ref 43–77)
Platelets: 257 10*3/uL (ref 150–400)
RBC: 4.66 MIL/uL (ref 3.87–5.11)
RDW: 15.6 % — ABNORMAL HIGH (ref 11.5–15.5)
WBC: 8.1 10*3/uL (ref 4.0–10.5)

## 2013-02-23 LAB — URINALYSIS, ROUTINE W REFLEX MICROSCOPIC
Glucose, UA: 250 mg/dL — AB
Hgb urine dipstick: NEGATIVE
Nitrite: NEGATIVE
Protein, ur: NEGATIVE mg/dL
Specific Gravity, Urine: 1.018 (ref 1.005–1.030)
pH: 5.5 (ref 5.0–8.0)

## 2013-02-23 LAB — RAPID URINE DRUG SCREEN, HOSP PERFORMED
Amphetamines: NOT DETECTED
Benzodiazepines: POSITIVE — AB
Opiates: POSITIVE — AB
Tetrahydrocannabinol: NOT DETECTED

## 2013-02-23 LAB — BASIC METABOLIC PANEL
BUN: 9 mg/dL (ref 6–23)
CO2: 25 mEq/L (ref 19–32)
Chloride: 97 mEq/L (ref 96–112)
GFR calc Af Amer: 72 mL/min — ABNORMAL LOW (ref >90)
Glucose, Bld: 276 mg/dL — ABNORMAL HIGH (ref 70–99)
Potassium: 3.7 mEq/L (ref 3.5–5.1)

## 2013-02-23 LAB — URINE MICROSCOPIC-ADD ON

## 2013-02-23 LAB — LIPID PANEL
HDL: 48 mg/dL (ref >39)
LDL Cholesterol: 139 mg/dL — ABNORMAL HIGH (ref 0–99)
Total CHOL/HDL Ratio: 4.5 RATIO

## 2013-02-23 LAB — HEMOGLOBIN A1C
Hgb A1c MFr Bld: 11.6 % — ABNORMAL HIGH (ref <5.7)
Mean Plasma Glucose: 286 mg/dL — ABNORMAL HIGH (ref <117)

## 2013-02-23 LAB — MAGNESIUM: Magnesium: 1.7 mg/dL (ref 1.5–2.5)

## 2013-02-23 LAB — PHOSPHORUS: Phosphorus: 2.9 mg/dL (ref 2.3–4.6)

## 2013-02-23 MED ORDER — ONDANSETRON HCL 4 MG/2ML IJ SOLN
4.0000 mg | Freq: Four times a day (QID) | INTRAMUSCULAR | Status: DC | PRN
  Administered 2013-02-23 – 2013-02-24 (×3): 4 mg via INTRAVENOUS
  Filled 2013-02-23 (×3): qty 2

## 2013-02-23 MED ORDER — ONDANSETRON HCL 4 MG PO TABS: 4.0000 mg | ORAL_TABLET | Freq: Four times a day (QID) | ORAL | Status: DC | PRN

## 2013-02-23 MED ORDER — LEVOTHYROXINE SODIUM 100 MCG IV SOLR
125.0000 ug | Freq: Every day | INTRAVENOUS | Status: DC
  Administered 2013-02-23 – 2013-02-24 (×2): 125 ug via INTRAVENOUS
  Filled 2013-02-23 (×2): qty 10

## 2013-02-23 MED ORDER — FLUTICASONE PROPIONATE HFA 44 MCG/ACT IN AERO
2.0000 | INHALATION_SPRAY | Freq: Two times a day (BID) | RESPIRATORY_TRACT | Status: DC
  Administered 2013-02-23 – 2013-02-24 (×3): 2 via RESPIRATORY_TRACT
  Filled 2013-02-23: qty 10.6

## 2013-02-23 MED ORDER — INSULIN GLARGINE 100 UNIT/ML ~~LOC~~ SOLN
20.0000 [IU] | Freq: Every day | SUBCUTANEOUS | Status: DC
  Administered 2013-02-23: 20 [IU] via SUBCUTANEOUS
  Filled 2013-02-23 (×2): qty 0.2

## 2013-02-23 MED ORDER — MORPHINE SULFATE 2 MG/ML IJ SOLN
1.0000 mg | INTRAMUSCULAR | Status: DC | PRN
  Administered 2013-02-23: 1 mg via INTRAVENOUS
  Filled 2013-02-23: qty 1

## 2013-02-23 MED ORDER — ENOXAPARIN SODIUM 40 MG/0.4ML ~~LOC~~ SOLN
40.0000 mg | SUBCUTANEOUS | Status: DC
  Administered 2013-02-23 – 2013-02-24 (×2): 40 mg via SUBCUTANEOUS
  Filled 2013-02-23 (×4): qty 0.4

## 2013-02-23 MED ORDER — INSULIN ASPART 100 UNIT/ML ~~LOC~~ SOLN
0.0000 [IU] | Freq: Three times a day (TID) | SUBCUTANEOUS | Status: DC
  Administered 2013-02-23 (×2): 2 [IU] via SUBCUTANEOUS
  Administered 2013-02-23: 3 [IU] via SUBCUTANEOUS

## 2013-02-23 MED ORDER — SODIUM CHLORIDE 0.9 % IV BOLUS (SEPSIS)
1000.0000 mL | Freq: Once | INTRAVENOUS | Status: AC
  Administered 2013-02-23: 1000 mL via INTRAVENOUS

## 2013-02-23 MED ORDER — PANTOPRAZOLE SODIUM 40 MG IV SOLR
40.0000 mg | INTRAVENOUS | Status: DC
  Administered 2013-02-23: 40 mg via INTRAVENOUS
  Filled 2013-02-23 (×2): qty 40

## 2013-02-23 MED ORDER — DIPHENHYDRAMINE HCL 50 MG/ML IJ SOLN
12.5000 mg | Freq: Once | INTRAMUSCULAR | Status: AC
  Administered 2013-02-23: 12.5 mg via INTRAVENOUS
  Filled 2013-02-23: qty 1

## 2013-02-23 MED ORDER — MORPHINE SULFATE 2 MG/ML IJ SOLN
2.0000 mg | INTRAMUSCULAR | Status: DC | PRN
  Administered 2013-02-23 (×3): 2 mg via INTRAVENOUS
  Administered 2013-02-23: 1 mg via INTRAVENOUS
  Administered 2013-02-23 – 2013-02-25 (×10): 2 mg via INTRAVENOUS
  Filled 2013-02-23 (×14): qty 1

## 2013-02-23 MED ORDER — LEVOTHYROXINE SODIUM 100 MCG IV SOLR
62.5000 ug | Freq: Every day | INTRAVENOUS | Status: DC
  Filled 2013-02-23: qty 5

## 2013-02-23 MED ORDER — SODIUM CHLORIDE 0.9 % IV SOLN
INTRAVENOUS | Status: AC
  Administered 2013-02-23 (×2): via INTRAVENOUS

## 2013-02-23 MED ORDER — SODIUM CHLORIDE 0.9 % IJ SOLN
3.0000 mL | Freq: Two times a day (BID) | INTRAMUSCULAR | Status: DC
  Administered 2013-02-23 – 2013-02-24 (×3): 3 mL via INTRAVENOUS

## 2013-02-23 MED ORDER — ALBUTEROL SULFATE HFA 108 (90 BASE) MCG/ACT IN AERS
2.0000 | INHALATION_SPRAY | Freq: Four times a day (QID) | RESPIRATORY_TRACT | Status: DC | PRN
  Administered 2013-02-23 – 2013-02-25 (×4): 2 via RESPIRATORY_TRACT
  Filled 2013-02-23: qty 6.7

## 2013-02-23 MED ORDER — ONDANSETRON HCL 4 MG/2ML IJ SOLN
4.0000 mg | Freq: Once | INTRAMUSCULAR | Status: AC
  Administered 2013-02-23: 4 mg via INTRAVENOUS
  Filled 2013-02-23: qty 2

## 2013-02-23 MED ORDER — DICYCLOMINE HCL 10 MG/ML IM SOLN
20.0000 mg | Freq: Once | INTRAMUSCULAR | Status: AC
  Administered 2013-02-23: 20 mg via INTRAMUSCULAR
  Filled 2013-02-23: qty 2

## 2013-02-23 MED ORDER — NICOTINE 14 MG/24HR TD PT24
14.0000 mg | MEDICATED_PATCH | Freq: Every day | TRANSDERMAL | Status: DC
  Administered 2013-02-23 – 2013-02-24 (×2): 14 mg via TRANSDERMAL
  Filled 2013-02-23 (×3): qty 1

## 2013-02-23 MED ORDER — FENTANYL CITRATE 0.05 MG/ML IJ SOLN
50.0000 ug | Freq: Once | INTRAMUSCULAR | Status: AC
  Administered 2013-02-23: 50 ug via INTRAVENOUS
  Filled 2013-02-23: qty 2

## 2013-02-23 NOTE — ED Notes (Signed)
[facility] here for transport to [place].  

## 2013-02-23 NOTE — ED Notes (Signed)
Bed: JX91 Expected date:  Expected time:  Means of arrival:  Comments: Hold

## 2013-02-23 NOTE — ED Provider Notes (Signed)
CSN: [id number]     Arrival date & time 02/22/13  2342 History   First MD Initiated Contact with Patient 02/22/13 2356     Chief Complaint  Patient presents with  . Abdominal Pain   (Consider location/radiation/quality/duration/timing/severity/associated sxs/prior Treatment) HPI 57 year old female presents emergency apartment with complaint of pancreatitis flare.  She reports over the last 2 days.  She has had increasing pain to her upper abdomen, extending into her back.  She reports symptoms are similar to her prior pancreatitis flares.  She has had nausea and vomiting.  Today.  Patient has past history of alcohol related pancreatitis, but reports she has not drank in years.  She's had hysterectomy.  Patient reports pain is a burning sensation.  She has had abdominal distention.  She's had normal bowel movement today.  She reports she has taken oxycodone at home without improvement in her symptoms.  Patient had routine colonoscopy done in the last week.  She reports polyp removed.  No new medication.  No history of gallstones.  No fevers no chills. Past Medical History  Diagnosis Date  . Asthma     past 10 years  . Diabetes mellitus     past 5 years  . Glaucoma     dx 06/2011-[facility]  . Hypertension   . Hyperlipidemia   . Hyperthyroidism   . History of TIA (transient ischemic attack)     01/06/2011, x2  . Pancreatitis   . Gout   . Depression   . Renal disorder     chronic kidney disease, stage 3  . Chronic pain   . Glaucoma   . Arthritis     djd -lower back, left knee  . Degenerative disc disease   . Bipolar disorder   . Gastritis   . COPD (chronic obstructive pulmonary disease)   . Schizophrenia   . 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 Colon polyps     colonoscopy 01/2013    Past Surgical History  Procedure Laterality Date  . Vaginal hysterectomy  1990  . Abdominal hysterectomy      still has ovaries    Family History  Problem Relation Age of  Onset  . Arthritis Mother   . Diabetes Mother   . Hypertension Mother   . Kidney disease Mother   . Heart disease Mother     died 21  . Heart disease      mother & father  . Hypertension      mother & father  . Colon cancer Neg Hx   . Prostate cancer Neg Hx   . Breast cancer Neg Hx   . Stomach cancer Neg Hx   . Hypertension Sister   . Hypertension Brother   . Kidney disease Brother    History  Substance Use Topics  . Smoking status: Current Every Day Smoker -- 0.01 packs/day for 30 years    Types: Cigarettes  . Smokeless tobacco: Never Used     Comment: smokes 2 cigs daily/uses patches.  . Alcohol Use: No   OB History   Grav Para Term Preterm Abortions TAB SAB Ect Mult Living                 Review of Systems  All other systems reviewed and are negative.   See History of Present Illness; otherwise all other systems are reviewed and negative  Allergies  Hydrocodone-acetaminophen; Metformin; Metformin and related; and Vicodin  Home Medications   Current Outpatient Rx  Name  Route  Sig  Dispense  Refill  . albuterol (PROVENTIL HFA;VENTOLIN HFA) 108 (90 BASE) MCG/ACT inhaler   Inhalation   Inhale 2 puffs into the lungs every 6 (six) hours as needed for wheezing.   1 Inhaler   2   . allopurinol (ZYLOPRIM) 300 MG tablet   Oral   Take 300 mg by mouth daily.         Marland Kitchen aspirin EC 81 MG tablet   Oral   Take 81 mg by mouth daily.         Marland Kitchen atorvastatin (LIPITOR) 80 MG tablet   Oral   Take 1 tablet (80 mg total) by mouth daily at 6 PM.   30 tablet   1   . brimonidine (ALPHAGAN) 0.2 % ophthalmic solution   Both Eyes   Place 1 drop into both eyes 2 (two) times daily.         . colchicine 0.6 MG tablet   Oral   Take 0.6 mg by mouth daily as needed (for gout flare-ups).          . diazepam (VALIUM) 5 MG tablet   Oral   Take 5 mg by mouth every 12 (twelve) hours as needed for anxiety.         . divalproex (DEPAKOTE ER) 500 MG 24 hr tablet   Oral    Take 1,000 mg by mouth at bedtime.         . fenofibrate 54 MG tablet   Oral   Take 54 mg by mouth daily.          . fluticasone (FLOVENT HFA) 44 MCG/ACT inhaler   Inhalation   Inhale 2 puffs into the lungs 2 (two) times daily.          . Fluticasone-Salmeterol (ADVAIR DISKUS) 250-50 MCG/DOSE AEPB   Inhalation   Inhale 1 puff into the lungs 2 times daily at 12 noon and 4 pm.   60 each   0   . gemfibrozil (LOPID) 600 MG tablet   Oral   Take 1 tablet (600 mg total) by mouth 2 (two) times daily before a meal.   60 tablet   1   . glipiZIDE (GLUCOTROL) 10 MG tablet   Oral   Take 10 mg by mouth daily.         . hydrochlorothiazide (HYDRODIURIL) 25 MG tablet   Oral   Take 1 tablet (25 mg total) by mouth daily. Take 1 tablet (25 mg total) by mouth daily.   30 tablet   0   . insulin glargine (LANTUS) 100 UNIT/ML injection   Subcutaneous   Inject 20 Units into the skin at bedtime.         . Insulin Syringe-Needle U-100 31G X 15/64" 0.3 ML MISC   Does not apply   1 Package by Does not apply route daily.   100 each   12   . latanoprost (XALATAN) 0.005 % ophthalmic solution   Both Eyes   Place 1 drop into both eyes at bedtime.   2.5 mL   0   . levothyroxine (SYNTHROID, LEVOTHROID) 125 MCG tablet   Oral   Take 250 mcg by mouth daily before breakfast.         . lisinopril (PRINIVIL,ZESTRIL) 40 MG tablet   Oral   Take 40 mg by mouth daily.         . nicotine (NICODERM CQ - DOSED IN MG/24 HOURS) 14 mg/24hr patch  Transdermal   Place 1 patch onto the skin daily. Removed patch on Sunday  No patch on currently         . nitroGLYCERIN (NITROSTAT) 0.4 MG SL tablet   Sublingual   Place 1 tablet (0.4 mg total) under the tongue every 5 (five) minutes as needed for chest pain.   30 tablet   3   . OLANZapine (ZYPREXA) 10 MG tablet   Oral   Take 10 mg by mouth at bedtime.         . ondansetron (ZOFRAN-ODT) 4 MG disintegrating tablet   Oral   Take 1  tablet (4 mg total) by mouth every 8 (eight) hours as needed for nausea.   20 tablet   0   . Oxycodone HCl 10 MG TABS   Oral   Take 10 mg by mouth every 6 (six) hours as needed (for pain).         . pantoprazole (PROTONIX) 40 MG tablet   Oral   Take 1 tablet (40 mg total) by mouth daily.   30 tablet   5   . sertraline (ZOLOFT) 25 MG tablet   Oral   Take 100 mg by mouth daily.          . sucralfate (CARAFATE) 1 GM/10ML suspension   Oral   Take 10 mLs (1 g total) by mouth 4 (four) times daily.   420 mL   0   . traZODone (DESYREL) 100 MG tablet   Oral   Take 100 mg by mouth at bedtime.          BP 144/94  Pulse 80  Temp(Src) 98 F (36.7 C) (Oral)  Resp 20  SpO2 98% Physical Exam  Nursing note and vitals reviewed. Constitutional: She is oriented to person, place, and time. She appears well-developed and well-nourished.  HENT:  Head: Normocephalic and atraumatic.  Nose: Nose normal.  Mouth/Throat: Oropharynx is clear and moist.  Eyes: Conjunctivae and EOM are normal. Pupils are equal, round, and reactive to light.  Neck: Normal range of motion. Neck supple. No JVD present. No tracheal deviation present. No thyromegaly present.  Cardiovascular: Normal rate, regular rhythm, normal heart sounds and intact distal pulses.  Exam reveals no gallop and no friction rub.   No murmur heard. Pulmonary/Chest: Effort normal and breath sounds normal. No stridor. No respiratory distress. She has no wheezes. She has no rales. She exhibits no tenderness.  Abdominal: Soft. Bowel sounds are normal. She exhibits no distension and no mass. There is tenderness (diffuse tenderness across upper abdomen). There is no rebound and no guarding.  Musculoskeletal: Normal range of motion. She exhibits no edema and no tenderness.  Lymphadenopathy:    She has no cervical adenopathy.  Neurological: She is alert and oriented to person, place, and time. She exhibits normal muscle tone. Coordination  normal.  Skin: Skin is warm and dry. No rash noted. No erythema. No pallor.  Psychiatric: She has a normal mood and affect. Her behavior is normal. Judgment and thought content normal.    ED Course  Procedures (including critical care time) Labs Review Labs Reviewed  CBC WITH DIFFERENTIAL - Abnormal; Notable for the following:    RDW 15.6 (*)    All other components within normal limits  COMPREHENSIVE METABOLIC PANEL - Abnormal; Notable for the following:    Chloride 95 (*)    Glucose, Bld 316 (*)    Alkaline Phosphatase 120 (*)    GFR calc non Af Denyse Dago  56 (*)    GFR calc Af Amer 65 (*)    All other components within normal limits  LIPASE, BLOOD - Abnormal; Notable for the following:    Lipase 708 (*)    All other components within normal limits  GLUCOSE, CAPILLARY - Abnormal; Notable for the following:    Glucose-Capillary 292 (*)    All other components within normal limits  URINE RAPID DRUG SCREEN (HOSP PERFORMED)  MAGNESIUM  PHOSPHORUS  BASIC METABOLIC PANEL   Imaging Review No results found.  MDM   1. Pancreatitis   2. Hyperglycemia without ketosis    57 year old female with upper abdominal pain, history of pancreatitis.  We'll check labs.  Will give Bentyl, place, IV control nausea and pain   Records reviewed, patient has strong history of possible narcotic diversion.  Both here and at [facility] seen.   Pt with significantly elevated lipase.  She has not had good pain relief with her home medications.  D/w im resident who will admit to their service at [facility].  Olivia Mackie, MD 02/23/13 [phone]

## 2013-02-23 NOTE — Progress Notes (Signed)
Subjective: Patient seen at the bedside. She is still complaining of abdominal pain in the epigastric region. She denies chest pain, SOB, nausea, vomiting. We discussed taking off some of her medications that can cause pancreatitis, and she is amenable.  Objective: Vital signs in last 24 hours: Filed Vitals:   02/22/13 2350 02/23/13 0253 02/23/13 0425 02/23/13 1027  BP: 144/94 130/70 151/74 127/66  Pulse: 80 79 74 73  Temp: 98 F (36.7 C)  97.6 F (36.4 C) 97.6 F (36.4 C)  TempSrc: Oral  Oral   Resp: 20 18 19 18   Height:   5\' 2"  (1.575 m)   Weight:   195 lb 12.8 oz (88.814 kg)   SpO2: 98% 94% 96% 94%   Weight change:   Intake/Output Summary (Last 24 hours) at 02/23/13 1230 Last data filed at 02/23/13 0900  Gross per 24 hour  Intake      0 ml  Output      0 ml  Net      0 ml    Physical Exam:  General: alert, cooperative, and in no apparent distress HEENT: NCAT, vision grossly intact, oropharynx clear and non-erythematous  Neck: supple, no lymphadenopathy Lungs: clear to ascultation bilaterally, normal work of respiration, no wheezes, rales, ronchi Heart: regular rate and rhythm, no murmurs, gallops, or rubs Abdomen: tenderness to deep palpation in epigastric region with no rebound or guarding, soft, non-distended, normal bowel sounds  Extremities: 2+ DP pulses bilaterally, no cyanosis, clubbing, or edema Neurologic: alert & oriented X3, cranial nerves II-XII intact, strength grossly intact, sensation intact to light touch  Lab Results: Basic Metabolic Panel:  Recent Labs Lab 02/23/13 0030 02/23/13 0630  NA 135 135  K 3.7 3.7  CL 95* 97  CO2 26 25  GLUCOSE 316* 276*  BUN 11 9  CREATININE 1.09 1.00  CALCIUM 9.8 9.0  MG  --  1.7  PHOS  --  2.9   Liver Function Tests:  Recent Labs Lab 02/23/13 0030  AST 14  ALT 11  ALKPHOS 120*  BILITOT 0.3  PROT 7.6  ALBUMIN 3.8    Recent Labs Lab 02/23/13 0030  LIPASE 708*   CBC:  Recent Labs Lab  02/23/13 0030  WBC 8.1  NEUTROABS 4.8  HGB 13.5  HCT 39.0  MCV 83.7  PLT 257   CBG:  Recent Labs Lab 02/23/13 0025 02/23/13 0730 02/23/13 1202  GLUCAP 292* 240* 143*   Urine Drug Screen: Drugs of Abuse     Component Value Date/Time   LABOPIA POSITIVE* 02/23/2013 0956   LABOPIA NEG 01/23/2013 1507   COCAINSCRNUR POSITIVE* 02/23/2013 0956   COCAINSCRNUR NEG 01/23/2013 1507   LABBENZ POSITIVE* 02/23/2013 0956   LABBENZ NEG 01/23/2013 1507   AMPHETMU NONE DETECTED 02/23/2013 0956   THCU NONE DETECTED 02/23/2013 0956   LABBARB NONE DETECTED 02/23/2013 0956   LABBARB NEG 01/23/2013 1507    Urinalysis    Component Value Date/Time   COLORURINE YELLOW 02/23/2013 0956   APPEARANCEUR CLEAR 02/23/2013 0956   LABSPEC 1.018 02/23/2013 0956   PHURINE 5.5 02/23/2013 0956   GLUCOSEU 250* 02/23/2013 0956   HGBUR NEGATIVE 02/23/2013 0956   BILIRUBINUR NEGATIVE 02/23/2013 0956   KETONESUR NEGATIVE 02/23/2013 0956   PROTEINUR NEGATIVE 02/23/2013 0956   UROBILINOGEN 0.2 02/23/2013 0956   NITRITE NEGATIVE 02/23/2013 0956   LEUKOCYTESUR SMALL* 02/23/2013 0956  Few squams, rare bacteria  Micro Results: No results found for this or any previous visit (from the past 240  hour(s)). Studies/Results: No results found. Medications: I have reviewed the patient's current medications. Scheduled Meds: . enoxaparin (LOVENOX) injection  40 mg Subcutaneous Q24H  . fluticasone  2 puff Inhalation BID  . insulin aspart  0-15 Units Subcutaneous TID WC  . insulin glargine  20 Units Subcutaneous QHS  . levothyroxine  125 mcg Intravenous Daily  . nicotine  14 mg Transdermal Daily  . pantoprazole (PROTONIX) IV  40 mg Intravenous Q24H  . sodium chloride  3 mL Intravenous Q12H   Continuous Infusions: . sodium chloride 100 mL/hr at 02/23/13 0509   PRN Meds:.albuterol, morphine injection, ondansetron (ZOFRAN) IV, ondansetron Assessment/Plan: #Acute on chronic pancreatitis- Pt presents with symptoms similar to her  episodes of acute pancreatitis; she has a history of EtOH abuse but none recently. Lipase 708 on admission (103 in 10/2012). Abdominal u/s in 06/2010 showed no gallstones. CT scan in 06/2012 showed no evidence of chronic pancreatitis. Of note, pt takes HCTZ and Depakote at home (has been on both for years) both of which may precipitate episodes of pancreatitis. Her UDS was positive for cocaine which certainly  may have contributed. UA shows glucose and small LE. No dysuria. WBC wnl. Mag and Phos wnl. Lipid panel showed TC 218, TG 156, LDL 139. - NPO  - IVF NS 100 cc/hr x 12 hours - Morphine 2mg  q4h prn pain  - Zofran prn nausea  - No narcotics outpatient per clinic note, pt needs to follow-up with pain clinic  - Will discontinue her fibrates, sucralfate on discharge as these have also been shown to contribute to pancreatitis - AM BMP, CBC  #Hypothyroidism- TSH 56, on levothyroxine 250 mg daily at home - Synthroid 125 mg IV daily while NPO  - Checking a free T4 with am labs  #DM2- A1C 8.8 in 10/2012; BG 292 on admission. On Lantus 20 units daily but BGs have been running high at home (usually 320s). No episodes of hypoglycemia. Pt has peripheral neuropathy.  -recheck A1C (lab in process) -Lantus 20 units daily  -CBGs q4h  -SSI moderate   #HTN- stable; pt has taken HCTZ since 2008. -holding home medications for now while NPO, this will also serve as a trial off HCTZ since could be contributing to pancreatitis -continue to monitor   #HLD- LDL 139, triglycerides 156; on atorvastatin, gemfibrozil, fenofibrate at home  -holding home meds while NPO  - Will discontinue her fibrates on discharge as these have also been shown to contribute to pancreatitis  #Chronic back pain- see HPI, concern for narcotic seeking behavior, did not go to pain clinic appointment earlier this month.  -analgesia per above   #Schizophrenia/bipolar disorder/depression- stable, managed by Dr. Carlynn Spry at [facility]    -holding home Depakote while NPO   #Gout- stable  -holding home allopurinol while NPO  -wants information on "dos and don'ts of gout" at discharge   #Tobacco abuse- smokes 1-2 cigarettes/day, uses patch at home  -nicotine patch  -smoking cessation counseling   #DVT PPX- lovenox   #Code status- full code   Dispo: Disposition is deferred at this time, awaiting improvement of current medical problems.  Anticipated discharge in approximately 1-3 day(s).   The patient does have a current PCP Windell Hummingbird, MD) and does need an [facility] hospital follow-up appointment after discharge.  The patient does not have transportation limitations that hinder transportation to clinic appointments.  .Services Needed at time of discharge: Y = Yes, Blank = No PT:   OT:   RN:  Equipment:   Other:     LOS: 1 day   Vivi Barrack, MD 02/23/2013, 12:30 PM

## 2013-02-23 NOTE — Addendum Note (Signed)
Addended by: Remus Blake on: 02/23/2013 03:59 PM   Modules accepted: Orders

## 2013-02-23 NOTE — Progress Notes (Deleted)
PHARMACIST - PHYSICIAN COMMUNICATION DR:  CONCERNING:  Synthroid   Synthroid IV ordered at same dose as PO taken at home; per H&P Synthroid to be continued; IV equivalency is 1/2 of PO dose; has been changed accordingly.  Please reorder if pt requires higher dose (no new thyroid labs ordered).    Vernard Gambles, PharmD, BCPS 02/23/2013 6:09 AM

## 2013-02-23 NOTE — H&P (Signed)
  Date: 02/23/2013  Patient name: [name]  Medical record number: [id number]  Date of birth: 1956/02/01   I have seen and evaluated [name] and discussed their care with the Residency Team. [name] is a 57yo woman who presents with acute pancreatitis, with Lipase > 700.    Assessment and Plan: I have seen and evaluated the patient as outlined above. I agree with the formulated Assessment and Plan as detailed in the residents' admission note, with the following changes:   1. Acute on chronic pancreatitis, multiple episodes - NPO with IVF at 100cc/hr - Morphine PRN for pain, zofran prn for nausea - Follow labs closely, ensure euvolemia and improvement in nausea - Start PO once nausea and pain improved, likely 9/30.  - Thorough med review for any inciting medications as she has a large list of medications, will stop any non essential medications  2. Hypothyroidism - IV synthroid while NPO  3. DM2 - Lantus 20 units daily with SSI - May need to increase once taking PO  Other issues per resident note.   Inez Catalina, MD 9/29/20144:02 PM

## 2013-02-23 NOTE — H&P (Signed)
Date: 02/23/2013               Patient Name:  [name] MRN: [id number]  DOB: 1956-03-28 Age / Sex: 57 y.o., female   PCP: Windell Hummingbird, MD         Medical Service: Internal Medicine Teaching Service         Attending Physician: Dr. Burns Spain, MD    First Contact: Dr. Claudell Kyle Pager: [phone]  Second Contact: Dr. Dorise Hiss Pager: [phone]       After Hours (After 5p/  First Contact Pager: [phone]  weekends / holidays): Second Contact Pager: [phone]   Chief Complaint: abdominal pain/pancreatitis  History of Present Illness:  Ms. 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    Patient states she has had abdominal pain x 2 days- epigastric, constant, sharp, radiating around her sides and to back, associated with nausea/vomiting (similar to typical pancreatitis episodes).  She vomited twice yesterday, clear NBNB.  Pain worse with lying or moving; she tried taking oxycodone (which she has for chronic back pain) with little relief.  She has had "pretty good" oral intake, drinking water and milk at home, was eating well until yesterday when pain intensified.  Denies fever, alcohol intake.     Patient has had narcotic seeking behavior at clinic appointments; she recently tranferred her care from [facility], saw Dr. Shirlee Latch in July and Dr. Kirtland Bouchard twice in August, on all three visits requesting pain meds.  Did sign pain contract in August.  She left clinic without providing a urine sample at early August appt and had a UDS negative for oxycodone on 01/23/13.  She missed her appointment with pain clinic (Dr. Jordan Likes) earlier this month, states she will need  more narcotics at discharge until she can find a new pain clinic as Dr. Jordan Likes does not accept patients who no show to first appointment.  She is on disability for chronic back pain (following fall).   Patient had a sleep study on 9/18 and colonoscopy earlier this month (one polyp removed).   Meds: Current Facility-Administered Medications  Medication Dose Route Frequency Provider Last Rate Last Dose  . 0.9 %  sodium chloride infusion   Intravenous Continuous Annett Gula, MD 100 mL/hr at 02/23/13 0509    . albuterol (PROVENTIL HFA;VENTOLIN HFA) 108 (90 BASE) MCG/ACT inhaler 2 puff  2 puff Inhalation Q6H PRN Annett Gula, MD      . enoxaparin (LOVENOX) injection 40 mg  40 mg Subcutaneous Q24H Annett Gula, MD      . fluticasone (FLOVENT HFA) 44 MCG/ACT inhaler 2 puff  2 puff Inhalation BID Annett Gula, MD      . insulin glargine (LANTUS) injection 20 Units  20 Units Subcutaneous QHS Annett Gula, MD      . morphine 2 MG/ML injection 1 mg  1 mg Intravenous Q4H PRN Annett Gula, MD   1 mg at 02/23/13 0510  . ondansetron (ZOFRAN) tablet 4 mg  4 mg Oral Q6H PRN Annett Gula, MD       Or  . ondansetron [facility]) injection 4 mg  4 mg Intravenous Q6H PRN Annett Gula,  MD      . pantoprazole (PROTONIX) injection 40 mg  40 mg Intravenous Q24H Annett Gula, MD      . sodium chloride 0.9 % injection 3 mL  3 mL Intravenous Q12H Annett Gula, MD        Allergies: Allergies as of 02/22/2013 - Review Complete 02/22/2013  Allergen Reaction Noted  . Hydrocodone-acetaminophen Hives 01/02/2013  . Metformin Diarrhea 01/02/2013  . Metformin and related Diarrhea 03/01/2012  . Vicodin [hydrocodone-acetaminophen] Hives 09/18/2011   Past Medical History  Diagnosis Date  . Asthma     past 10 years  . Diabetes mellitus     past 5 years  . Glaucoma     dx 06/2011-[facility]  . Hypertension   . Hyperlipidemia   . Hyperthyroidism   . History of TIA (transient ischemic attack)       01/06/2011, x2  . Pancreatitis   . Gout   . Depression   . Renal disorder     chronic kidney disease, stage 3  . Chronic pain   . Glaucoma   . Arthritis     djd -lower back, left knee  . Degenerative disc disease   . Bipolar disorder   . Gastritis   . COPD (chronic obstructive pulmonary disease)   . Schizophrenia   . 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 Colon polyps     colonoscopy 01/2013    Past Surgical History  Procedure Laterality Date  . Vaginal hysterectomy  1990  . Abdominal hysterectomy      still has ovaries    Family History  Problem Relation Age of Onset  . Arthritis Mother   . Diabetes Mother   . Hypertension Mother   . Kidney disease Mother   . Heart disease Mother     died 76  . Heart disease      mother & father  . Hypertension      mother & father  . Colon cancer Neg Hx   . Prostate cancer Neg Hx   . Breast cancer Neg Hx   . Stomach cancer Neg Hx   . Hypertension Sister   . Hypertension Brother   . Kidney disease Brother    History   Social History  . Marital Status: Single    Spouse Name: N/A    Number of Children: N/A  . Years of Education: N/A   Occupational History  . Disability    Social History Main Topics  . Smoking status: Current Every Day Smoker -- 0.01 packs/day for 30 years    Types: Cigarettes  . Smokeless tobacco: Never Used     Comment: smokes 2 cigs daily/uses patches.  . Alcohol Use: No  . Drug Use: Yes  . Sexual Activity: Not on file   Other Topics Concern  . Not on file   Social History Narrative             Review of Systems: Review of Systems  Constitutional: Negative for fever, chills and weight loss.  Eyes: Negative for blurred vision.  Respiratory: Negative for cough, sputum production and shortness of breath.   Cardiovascular: Positive for orthopnea. Negative for chest pain and leg swelling.  Gastrointestinal: Positive for nausea, vomiting and abdominal pain. Negative for diarrhea,  constipation, blood in stool and melena.  Genitourinary: Negative for dysuria.  Musculoskeletal: Positive for back pain. Negative for falls.  Skin: Negative for rash.  Neurological: Positive for tingling. Negative for dizziness, focal weakness,  loss of consciousness, weakness and headaches.    Physical Exam: Blood pressure 151/74, pulse 74, temperature 97.6 F (36.4 C), temperature source Oral, resp. rate 19, height 5\' 2"  (1.575 m), weight 195 lb 12.8 oz (88.814 kg), SpO2 96.00%. General: alert, cooperative, and in no apparent distress HEENT: NCAT, vision grossly intact, oropharynx clear and non-erythematous  Neck: supple, no lymphadenopathy Lungs: clear to ascultation bilaterally, normal work of respiration, no wheezes, rales, ronchi Heart: regular rate and rhythm, no murmurs, gallops, or rubs Abdomen: tenderness to palpation in epigastric region with no rebound or guarding, soft, non-distended, normal bowel sounds Extremities: 2+ DP pulses bilaterally, no cyanosis, clubbing, or edema Neurologic: alert & oriented X3, cranial nerves II-XII intact, strength grossly intact, sensation intact to light touch   Lab results: Basic Metabolic Panel:  Recent Labs  16/10/96 0030  NA 135  K 3.7  CL 95*  CO2 26  GLUCOSE 316*  BUN 11  CREATININE 1.09  CALCIUM 9.8   Liver Function Tests:  Recent Labs  02/23/13 0030  AST 14  ALT 11  ALKPHOS 120*  BILITOT 0.3  PROT 7.6  ALBUMIN 3.8    Recent Labs  02/23/13 0030  LIPASE 708*   CBC:  Recent Labs  02/23/13 0030  WBC 8.1  NEUTROABS 4.8  HGB 13.5  HCT 39.0  MCV 83.7  PLT 257   CBG:  Recent Labs  02/23/13 0025  GLUCAP 292*   Other results: EKG: pending  Assessment & Plan by Problem: #Acute on chronic pancreatitis- Pt presents with symptoms similar to her episodes of acute pancreatitis; she has a history of EtOH abuse but none recently.  Lipase 708 on admission (103 in 10/2012). Abdominal u/s in 06/2010 showed no  gallstones. CT scan in 06/2012 showed no evidence of chronic pancreatitis. Of note, pt takes HCTZ and Depakote at home (has been on both for years) which may precipitate episodes of pancreatitis.  -admit to med-surg -NPO -IVF NS 100 cc/hr x 12 hours (already had 1L bolus) -morphine 2mg  q4h prn pain  -Zofran prn nausea -UA, UDS -repeat BMP, Mg, Phos, lipase -no narcotics outpatient per clinic note, pt needs to follow-up with pain clinic  #Hypothyroidism- stable, on levothyroxine 250 mg daily at home -synthroid 125 mg IV daily while NPO   #DM2- A1C 8.8 in 10/2012; BG 292 on admission.  On Lantus 20 units daily but BGs have been running high at home (usually 320s). No episodes of hypoglycemia. Pt has peripheral neuropathy.  -recheck A1C  -Lantus 20 units daily -CBGs q4h -SSI moderate  #HTN- stable; pt has taken HCTZ since 2008 but may want to consider alternative as this medication can cause pancreatitis -holding home medications for now while NPO  -continue to monitor  #HL- LDL160, triglycerides 253 in 06/2012; on atorvastatin, gemfibrozil, fenofibrate at home -holding home meds while NPO  #Chronic back pain- see HPI, concern for narcotic seeking behavior, did not go to pain clinic appointment earlier this month.  -analgesia per above  #Schizophrenia/bipolar disorder/depression- stable, managed by Dr. Carlynn Spry at [facility]  -holding home Depakote while NPO  #Gout- stable  -holding home allopurinol for home while NPO -wants information on "dos and don'ts of gout" at discharge  #Tobacco abuse- smokes 1-2 cigarettes/day, uses patch at home -nicotine patch -smoking cessation counseling  #DVT PPX- lovenox  #Code status- full code  Dispo: Disposition is deferred at this time, awaiting improvement of current medical problems. Anticipated discharge in approximately 3-4 day(s).   The  patient does have a current PCP Windell Hummingbird, MD) and does need an [facility] hospital follow-up  appointment after discharge. (Has appt with Dr. Kirtland Bouchard on 10/1 at 3:30p, Dr. Darci Needle on 11/3 at 3p.)  Signed: Rocco Serene, MD 02/23/2013, 5:13 AM

## 2013-02-24 ENCOUNTER — Encounter (HOSPITAL_COMMUNITY): Payer: Self-pay | Admitting: *Deleted

## 2013-02-24 LAB — BASIC METABOLIC PANEL
BUN: 9 mg/dL (ref 6–23)
CO2: 26 mEq/L (ref 19–32)
Calcium: 8.8 mg/dL (ref 8.4–10.5)
GFR calc Af Amer: 64 mL/min — ABNORMAL LOW (ref >90)
GFR calc non Af Amer: 55 mL/min — ABNORMAL LOW (ref >90)
Potassium: 3.6 mEq/L (ref 3.5–5.1)
Sodium: 137 mEq/L (ref 135–145)

## 2013-02-24 LAB — CBC
Hemoglobin: 12 g/dL (ref 12.0–15.0)
MCHC: 33.9 g/dL (ref 30.0–36.0)
MCV: 84.5 fL (ref 78.0–100.0)
Platelets: 220 10*3/uL (ref 150–400)
RBC: 4.19 MIL/uL (ref 3.87–5.11)
RDW: 16 % — ABNORMAL HIGH (ref 11.5–15.5)

## 2013-02-24 LAB — GLUCOSE, CAPILLARY
Glucose-Capillary: 273 mg/dL — ABNORMAL HIGH (ref 70–99)
Glucose-Capillary: 350 mg/dL — ABNORMAL HIGH (ref 70–99)
Glucose-Capillary: 219 mg/dL — ABNORMAL HIGH (ref 70–99)

## 2013-02-24 MED ORDER — INSULIN GLARGINE 100 UNIT/ML ~~LOC~~ SOLN
25.0000 [IU] | Freq: Every day | SUBCUTANEOUS | Status: DC
  Administered 2013-02-24: 25 [IU] via SUBCUTANEOUS
  Filled 2013-02-24 (×2): qty 0.25

## 2013-02-24 MED ORDER — SERTRALINE HCL 100 MG PO TABS
100.0000 mg | ORAL_TABLET | Freq: Every day | ORAL | Status: DC
  Administered 2013-02-24 – 2013-02-25 (×2): 100 mg via ORAL
  Filled 2013-02-24 (×2): qty 1

## 2013-02-24 MED ORDER — COLCHICINE 0.6 MG PO TABS: 0.6000 mg | ORAL_TABLET | Freq: Every day | ORAL | Status: DC | PRN

## 2013-02-24 MED ORDER — DIPHENHYDRAMINE HCL 25 MG PO CAPS
25.0000 mg | ORAL_CAPSULE | Freq: Once | ORAL | Status: AC
  Administered 2013-02-24: 25 mg via ORAL
  Filled 2013-02-24: qty 1

## 2013-02-24 MED ORDER — DIVALPROEX SODIUM ER 500 MG PO TB24
1000.0000 mg | ORAL_TABLET | Freq: Every day | ORAL | Status: DC
  Administered 2013-02-24: 1000 mg via ORAL
  Filled 2013-02-24 (×2): qty 2

## 2013-02-24 MED ORDER — INSULIN ASPART 100 UNIT/ML ~~LOC~~ SOLN
4.0000 [IU] | Freq: Three times a day (TID) | SUBCUTANEOUS | Status: DC
  Administered 2013-02-24 – 2013-02-25 (×3): 4 [IU] via SUBCUTANEOUS

## 2013-02-24 MED ORDER — OLANZAPINE 10 MG PO TABS
10.0000 mg | ORAL_TABLET | Freq: Every day | ORAL | Status: DC
  Administered 2013-02-24: 10 mg via ORAL
  Filled 2013-02-24 (×2): qty 1

## 2013-02-24 MED ORDER — BRIMONIDINE TARTRATE 0.2 % OP SOLN
1.0000 [drp] | Freq: Two times a day (BID) | OPHTHALMIC | Status: DC
  Administered 2013-02-24 – 2013-02-25 (×3): 1 [drp] via OPHTHALMIC
  Filled 2013-02-24: qty 5

## 2013-02-24 MED ORDER — LISINOPRIL 40 MG PO TABS
40.0000 mg | ORAL_TABLET | Freq: Every day | ORAL | Status: DC
  Administered 2013-02-24 – 2013-02-25 (×2): 40 mg via ORAL
  Filled 2013-02-24 (×2): qty 1

## 2013-02-24 MED ORDER — LIVING WELL WITH DIABETES BOOK
Freq: Once | Status: AC
  Administered 2013-02-24: 17:00:00
  Filled 2013-02-24: qty 1

## 2013-02-24 MED ORDER — ASPIRIN EC 81 MG PO TBEC
81.0000 mg | DELAYED_RELEASE_TABLET | Freq: Every day | ORAL | Status: DC
  Administered 2013-02-24 – 2013-02-25 (×2): 81 mg via ORAL
  Filled 2013-02-24 (×2): qty 1

## 2013-02-24 MED ORDER — ATORVASTATIN CALCIUM 80 MG PO TABS
80.0000 mg | ORAL_TABLET | Freq: Every day | ORAL | Status: DC
  Administered 2013-02-24: 80 mg via ORAL
  Filled 2013-02-24 (×2): qty 1

## 2013-02-24 MED ORDER — PANTOPRAZOLE SODIUM 40 MG PO TBEC
40.0000 mg | DELAYED_RELEASE_TABLET | Freq: Every day | ORAL | Status: DC
  Administered 2013-02-24 – 2013-02-25 (×2): 40 mg via ORAL
  Filled 2013-02-24 (×2): qty 1

## 2013-02-24 MED ORDER — TRAZODONE HCL 100 MG PO TABS
100.0000 mg | ORAL_TABLET | Freq: Every day | ORAL | Status: DC
  Administered 2013-02-24: 100 mg via ORAL
  Filled 2013-02-24 (×2): qty 1

## 2013-02-24 MED ORDER — LEVOTHYROXINE SODIUM 125 MCG PO TABS
250.0000 ug | ORAL_TABLET | Freq: Every day | ORAL | Status: DC
  Administered 2013-02-25: 250 ug via ORAL
  Filled 2013-02-24 (×2): qty 2

## 2013-02-24 MED ORDER — LATANOPROST 0.005 % OP SOLN
1.0000 [drp] | Freq: Every day | OPHTHALMIC | Status: DC
  Administered 2013-02-24: 1 [drp] via OPHTHALMIC
  Filled 2013-02-24: qty 2.5

## 2013-02-24 MED ORDER — INSULIN ASPART 100 UNIT/ML ~~LOC~~ SOLN
0.0000 [IU] | Freq: Three times a day (TID) | SUBCUTANEOUS | Status: DC
  Administered 2013-02-24: 5 [IU] via SUBCUTANEOUS
  Administered 2013-02-24: 3 [IU] via SUBCUTANEOUS
  Administered 2013-02-25: 9 [IU] via SUBCUTANEOUS

## 2013-02-24 MED ORDER — ALLOPURINOL 300 MG PO TABS
300.0000 mg | ORAL_TABLET | Freq: Every day | ORAL | Status: DC
  Administered 2013-02-24 – 2013-02-25 (×2): 300 mg via ORAL
  Filled 2013-02-24 (×2): qty 1

## 2013-02-24 MED ORDER — MOMETASONE FURO-FORMOTEROL FUM 100-5 MCG/ACT IN AERO
2.0000 | INHALATION_SPRAY | Freq: Two times a day (BID) | RESPIRATORY_TRACT | Status: DC
  Administered 2013-02-24 – 2013-02-25 (×2): 2 via RESPIRATORY_TRACT
  Filled 2013-02-24 (×2): qty 8.8

## 2013-02-24 NOTE — Progress Notes (Signed)
Smoking/tobacco cessation education given to patient.

## 2013-02-24 NOTE — Progress Notes (Signed)
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 Pt states she checks blood sugars at home and they run >250 mg/dL. Has lost 8-10 pounds over the past 2 months.  States she is interested in OP Diabetes Education. Discussed HgbA1C results and stressed importance of controlling blood sugars to prevent long-term complications. States she tries to eat healthy and wants to get better control of her blood sugars.    Inpatient Diabetes Program Recommendations Insulin - Basal: Increase Lantus to 25 units QHS Insulin - Meal Coverage: Will likely need meal coverage intake - Add Novolog 3 units tidwc if pt eats>50% meal HgbA1C: 11.6% - uncontrolled Outpatient Referral: Will order OP Diabetes Education consult for uncontrolled DM Diet: When advanced, CHO mod med Will order Living Well With Diabetes book and encouraged pt to view diabetes videos on pt ed channel.  Will continue to follow while inpatient. Thank you. Ailene Ards, RD, LDN, CDE Inpatient Diabetes Coordinator [phone]

## 2013-02-24 NOTE — Progress Notes (Addendum)
Subjective: Patient seen at the bedside. She is still complaining of some abdominal pain in the epigastric region, but it is improving. She tolerated a clear liquid diet this morning for breakfast. She denies chest pain, SOB, nausea, vomiting. We discussed taking off some of her medications that can cause pancreatitis, and she is amenable. These include her 2 fibrates, her Flovent, and possibly her HCTZ if her blood pressure remains stable.   She is going to [place] on Monday, 10/6, for her niece's sweet sixteen party. She will be there for 2 weeks. She is requesting pain medication to get her through the trip.   Objective: Vital signs in last 24 hours: Filed Vitals:   02/23/13 1326 02/23/13 1935 02/23/13 2200 02/24/13 0626  BP: 119/65  142/71 142/79  Pulse: 61  65 76  Temp: 97.7 F (36.5 C)  98.1 F (36.7 C) 98.1 F (36.7 C)  TempSrc:   Oral Oral  Resp: 18  19 17   Height:      Weight:      SpO2: 93% 93% 95% 93%   Weight change:   Intake/Output Summary (Last 24 hours) at 02/24/13 1140 Last data filed at 02/23/13 1851  Gross per 24 hour  Intake      0 ml  Output      0 ml  Net      0 ml    Physical Exam:  General: alert, cooperative, and in no apparent distress HEENT: NCAT, vision grossly intact, oropharynx clear and non-erythematous  Neck: supple, no lymphadenopathy Lungs: clear to ascultation bilaterally, normal work of respiration, no wheezes, rales, ronchi Heart: regular rate and rhythm, no murmurs, gallops, or rubs Abdomen: tenderness to deep palpation in epigastric region with no rebound or guarding, soft, non-distended, normal bowel sounds  Extremities: 2+ DP pulses bilaterally, no cyanosis, clubbing, or edema Neurologic: alert & oriented X3, cranial nerves II-XII intact, strength grossly intact, sensation intact to light touch  Lab Results: Basic Metabolic Panel:  Recent Labs Lab 02/23/13 0630 02/24/13 0740  NA 135 137  K 3.7 3.6  CL 97 100  CO2 25 26    GLUCOSE 276* 121*  BUN 9 9  CREATININE 1.00 1.11*  CALCIUM 9.0 8.8  MG 1.7  --   PHOS 2.9  --    Liver Function Tests:  Recent Labs Lab 02/23/13 0030  AST 14  ALT 11  ALKPHOS 120*  BILITOT 0.3  PROT 7.6  ALBUMIN 3.8    Recent Labs Lab 02/23/13 0030  LIPASE 708*   CBC:  Recent Labs Lab 02/23/13 0030 02/24/13 0740  WBC 8.1 6.2  NEUTROABS 4.8  --   HGB 13.5 12.0  HCT 39.0 35.4*  MCV 83.7 84.5  PLT 257 220   CBG:  Recent Labs Lab 02/23/13 0025 02/23/13 0730 02/23/13 1202 02/23/13 1733 02/23/13 2152 02/24/13 0754  GLUCAP 292* 240* 143* 123* 86 116*   Urine Drug Screen: Drugs of Abuse     Component Value Date/Time   LABOPIA POSITIVE* 02/23/2013 0956   LABOPIA NEG 01/23/2013 1507   COCAINSCRNUR POSITIVE* 02/23/2013 0956   COCAINSCRNUR NEG 01/23/2013 1507   LABBENZ POSITIVE* 02/23/2013 0956   LABBENZ NEG 01/23/2013 1507   AMPHETMU NONE DETECTED 02/23/2013 0956   THCU NONE DETECTED 02/23/2013 0956   LABBARB NONE DETECTED 02/23/2013 0956   LABBARB NEG 01/23/2013 1507    Urinalysis    Component Value Date/Time   COLORURINE YELLOW 02/23/2013 0956   APPEARANCEUR CLEAR 02/23/2013 0956  LABSPEC 1.018 02/23/2013 0956   PHURINE 5.5 02/23/2013 0956   GLUCOSEU 250* 02/23/2013 0956   HGBUR NEGATIVE 02/23/2013 0956   BILIRUBINUR NEGATIVE 02/23/2013 0956   KETONESUR NEGATIVE 02/23/2013 0956   PROTEINUR NEGATIVE 02/23/2013 0956   UROBILINOGEN 0.2 02/23/2013 0956   NITRITE NEGATIVE 02/23/2013 0956   LEUKOCYTESUR SMALL* 02/23/2013 0956  Few squams, rare bacteria  Micro Results: No results found for this or any previous visit (from the past 240 hour(s)). Studies/Results: No results found. Medications: I have reviewed the patient's current medications. Scheduled Meds: . allopurinol  300 mg Oral Daily  . aspirin EC  81 mg Oral Daily  . atorvastatin  80 mg Oral q1800  . brimonidine  1 drop Both Eyes BID  . divalproex  1,000 mg Oral QHS  . enoxaparin (LOVENOX) injection   40 mg Subcutaneous Q24H  . insulin aspart  4 Units Subcutaneous TID WC  . insulin glargine  25 Units Subcutaneous QHS  . latanoprost  1 drop Both Eyes QHS  . [START ON 02/25/2013] levothyroxine  250 mcg Oral QAC breakfast  . lisinopril  40 mg Oral Daily  . mometasone-formoterol  2 puff Inhalation BID  . nicotine  14 mg Transdermal Daily  . OLANZapine  10 mg Oral QHS  . pantoprazole  40 mg Oral Daily  . sertraline  100 mg Oral Daily  . sodium chloride  3 mL Intravenous Q12H  . traZODone  100 mg Oral QHS   Continuous Infusions:   PRN Meds:.albuterol, colchicine, morphine injection, ondansetron (ZOFRAN) IV, ondansetron Assessment/Plan: #Acute on chronic pancreatitis- Pt presents with symptoms similar to prior episodes of acute pancreatitis; she has a history of EtOH abuse but none recently. Lipase 708 on admission (103 in 10/2012). Abdominal u/s in 06/2010 showed no gallstones. CT scan in 06/2012 showed no evidence of chronic pancreatitis. Of note, pt takes HCTZ and Depakote at home (has been on both for years) which may precipitate episodes of pancreatitis. Her UDS was positive for cocaine which certainly may have contributed to the problem. UA shows glucose and small LE. No dysuria. WBC wnl. Mag and Phos wnl. Lipid panel showed TC 218, TG 156, LDL 139. - Advancing to full liquid diet at lunch  - Restarting appropriate po home medications - Morphine 2mg  q4h prn pain  - Zofran prn nausea  - Will discontinue her fibrates, sucralfate, Flovent, and possibly HCTZ on discharge as these have been shown to contribute to pancreatitis - No narcotics outpatient per clinic note, pt needs to follow-up with pain clinic   #Hypothyroidism- TSH 56, on levothyroxine 250 mg daily at home - Continue home synthroid - Checking a free T4 (in process)  #DM2- A1C 11.4; BG 292 on admission. On Lantus 20 units daily but BGs have been running high at home (usually 200-320s). No episodes of hypoglycemia. Pt has  peripheral neuropathy.  - Consult to diabetes coordinator - Changing insulin regimen to Lantus 25 units daily with Novolog 4 units TID AC - SSI - CBGs AC/GHS  #HTN- stable (142/79); pt has taken HCTZ since 2008. - Will continue to trial her off HCTZ since could be contributing to pancreatitis - Starting back ACE-I since tolerating po - Continue to monitor   #HLD- LDL 139, triglycerides 156; on atorvastatin, gemfibrozil, fenofibrate at home  - Continue home statin - Will discontinue her fibrates on discharge as these have also been shown to contribute to pancreatitis  #Chronic back pain- see HPI, concern for narcotic seeking behavior, did not  go to pain clinic appointment earlier this month.  - Analgesia per above  - Scheduled her for a follow up appointment in our clinic this Friday to discuss medication management prior to her trip to [place]  #Schizophrenia/bipolar disorder/depression- Stable, managed by Dr. Carlynn Spry at [facility]. - Continue home Depakote, olanzapine - When she leaves, will route discharge summary to Dr Carlynn Spry to look into her Depakote as a cause of recurrent pancreatitis  #Gout- stable  - Continue home allopurinol, colchicine prn - Wants information on "dos and don'ts of gout" at discharge   #Tobacco abuse- smokes 1-2 cigarettes/day, uses patch at home  -nicotine patch  -smoking cessation counseling   #DVT PPX- lovenox   #Code status- full code   Dispo: Disposition is deferred at this time, awaiting improvement of current medical problems.  Anticipated discharge in approximately 1-3 day(s).   The patient does have a current PCP Windell Hummingbird, MD) and does need an [facility] hospital follow-up appointment after discharge.  The patient does not have transportation limitations that hinder transportation to clinic appointments.  .Services Needed at time of discharge: Y = Yes, Blank = No PT:   OT:   RN:   Equipment:   Other:     LOS: 2 days   Vivi Barrack, MD 02/24/2013, 11:40 AM

## 2013-02-24 NOTE — Progress Notes (Signed)
  Date: 02/24/2013  Patient name: [name]  Medical record number: [id number]  Date of birth: 1956-04-28   This patient has been seen and the plan of care was discussed with the house staff including Dr. Claudell Kyle please see her note for complete details. I concur with their findings.   Inez Catalina, MD 02/24/2013, 1:33 PM

## 2013-02-25 ENCOUNTER — Ambulatory Visit: Payer: Self-pay | Admitting: Internal Medicine

## 2013-02-25 LAB — BASIC METABOLIC PANEL
BUN: 13 mg/dL (ref 6–23)
Chloride: 99 mEq/L (ref 96–112)
Creatinine, Ser: 1.17 mg/dL — ABNORMAL HIGH (ref 0.50–1.10)
GFR calc Af Amer: 60 mL/min — ABNORMAL LOW (ref >90)
GFR calc non Af Amer: 51 mL/min — ABNORMAL LOW (ref >90)
Potassium: 4 mEq/L (ref 3.5–5.1)

## 2013-02-25 LAB — GLUCOSE, CAPILLARY: Glucose-Capillary: 371 mg/dL — ABNORMAL HIGH (ref 70–99)

## 2013-02-25 MED ORDER — OXYCODONE HCL 10 MG PO TABS: 10.0000 mg | ORAL_TABLET | Freq: Four times a day (QID) | ORAL | Status: DC | PRN

## 2013-02-25 MED ORDER — POLYETHYLENE GLYCOL 3350 17 G PO PACK
17.0000 g | PACK | Freq: Every day | ORAL | Status: DC
  Administered 2013-02-25: 17 g via ORAL
  Filled 2013-02-25: qty 1

## 2013-02-25 MED ORDER — ONDANSETRON 4 MG PO TBDP: 4.0000 mg | ORAL_TABLET | Freq: Three times a day (TID) | ORAL | Status: DC | PRN

## 2013-02-25 MED ORDER — INSULIN GLARGINE 100 UNIT/ML ~~LOC~~ SOLN: 25.0000 [IU] | Freq: Every day | SUBCUTANEOUS | Status: DC

## 2013-02-25 MED ORDER — PANTOPRAZOLE SODIUM 40 MG PO TBEC: 40.0000 mg | DELAYED_RELEASE_TABLET | Freq: Every day | ORAL | Status: DC

## 2013-02-25 MED ORDER — INSULIN ASPART 100 UNIT/ML ~~LOC~~ SOLN: 4.0000 [IU] | Freq: Three times a day (TID) | SUBCUTANEOUS | Status: DC

## 2013-02-25 MED ORDER — INSULIN SYRINGE-NEEDLE U-100 31G X 15/64" 0.3 ML MISC
1.0000 | Freq: Every day | Status: DC
Start: 2013-02-25 — End: 2013-03-10

## 2013-02-25 MED ORDER — ASPIRIN EC 81 MG PO TBEC: 81.0000 mg | DELAYED_RELEASE_TABLET | Freq: Every day | ORAL | Status: DC

## 2013-02-25 NOTE — Progress Notes (Signed)
Subjective: Patient seen at the bedside. She is still complaining of a little bit of abdominal pain in the epigastric region, but it is much improved. She tolerated a regular diet last night with no nausea, vomiting, or worsening abdominal pain. She denies chest pain, SOB. We discussed taking off some of her medications that can cause pancreatitis, and she is amenable. These include her 2 fibrates, her Flovent, and her HCTZ.  She is going to [place] on Monday, 10/6, for her niece's sweet sixteen party, though she says she may leave earlier now that she feels so well. She will be there for 2 weeks. She is still requesting pain medication to get her through the trip. I informed her I would give her enough to get her through to her clinic appointment on Friday.   Objective: Vital signs in last 24 hours: Filed Vitals:   02/24/13 1400 02/24/13 2006 02/24/13 2202 02/25/13 0550  BP: 136/71  119/62 111/63  Pulse: 73  78 76  Temp: 97.4 F (36.3 C)  97.4 F (36.3 C) 98.1 F (36.7 C)  TempSrc: Oral  Oral Oral  Resp: 18  20 18   Height:      Weight:      SpO2: 92% 93% 92% 90%   Weight change:   Intake/Output Summary (Last 24 hours) at 02/25/13 0954 Last data filed at 02/25/13 0600  Gross per 24 hour  Intake    963 ml  Output    650 ml  Net    313 ml    Physical Exam:  General: alert, cooperative, and in no apparent distress HEENT: NCAT, vision grossly intact, oropharynx clear and non-erythematous  Neck: supple, no lymphadenopathy Lungs: clear to ascultation bilaterally, normal work of respiration, no wheezes, rales, ronchi Heart: regular rate and rhythm, no murmurs, gallops, or rubs Abdomen: No tenderness to palpation, no rebound or guarding, soft, non-distended, normal bowel sounds  Extremities: 2+ DP pulses bilaterally, no cyanosis, clubbing, or edema Neurologic: alert & oriented X3, cranial nerves II-XII intact, strength grossly intact, sensation intact to light touch  Lab  Results: Basic Metabolic Panel:  Recent Labs Lab 02/23/13 0630 02/24/13 0740 02/25/13 0545  NA 135 137 135  K 3.7 3.6 4.0  CL 97 100 99  CO2 25 26 25   GLUCOSE 276* 121* 315*  BUN 9 9 13   CREATININE 1.00 1.11* 1.17*  CALCIUM 9.0 8.8 8.7  MG 1.7  --   --   PHOS 2.9  --   --    Liver Function Tests:  Recent Labs Lab 02/23/13 0030  AST 14  ALT 11  ALKPHOS 120*  BILITOT 0.3  PROT 7.6  ALBUMIN 3.8    Recent Labs Lab 02/23/13 0030  LIPASE 708*   CBC:  Recent Labs Lab 02/23/13 0030 02/24/13 0740  WBC 8.1 6.2  NEUTROABS 4.8  --   HGB 13.5 12.0  HCT 39.0 35.4*  MCV 83.7 84.5  PLT 257 220   CBG:  Recent Labs Lab 02/24/13 0754 02/24/13 1157 02/24/13 1327 02/24/13 1707 02/24/13 2205 02/25/13 0733  GLUCAP 116* 273* 350* 219* 218* 371*   Urine Drug Screen: Drugs of Abuse     Component Value Date/Time   LABOPIA POSITIVE* 02/23/2013 0956   LABOPIA NEG 01/23/2013 1507   COCAINSCRNUR POSITIVE* 02/23/2013 0956   COCAINSCRNUR NEG 01/23/2013 1507   LABBENZ POSITIVE* 02/23/2013 0956   LABBENZ NEG 01/23/2013 1507   AMPHETMU NONE DETECTED 02/23/2013 0956   THCU NONE DETECTED 02/23/2013 0956  LABBARB NONE DETECTED 02/23/2013 0956   LABBARB NEG 01/23/2013 1507    Urinalysis    Component Value Date/Time   COLORURINE YELLOW 02/23/2013 0956   APPEARANCEUR CLEAR 02/23/2013 0956   LABSPEC 1.018 02/23/2013 0956   PHURINE 5.5 02/23/2013 0956   GLUCOSEU 250* 02/23/2013 0956   HGBUR NEGATIVE 02/23/2013 0956   BILIRUBINUR NEGATIVE 02/23/2013 0956   KETONESUR NEGATIVE 02/23/2013 0956   PROTEINUR NEGATIVE 02/23/2013 0956   UROBILINOGEN 0.2 02/23/2013 0956   NITRITE NEGATIVE 02/23/2013 0956   LEUKOCYTESUR SMALL* 02/23/2013 0956  Few squams, rare bacteria  Micro Results: No results found for this or any previous visit (from the past 240 hour(s)). Studies/Results: No results found. Medications: I have reviewed the patient's current medications. Scheduled Meds: . allopurinol  300  mg Oral Daily  . aspirin EC  81 mg Oral Daily  . atorvastatin  80 mg Oral q1800  . brimonidine  1 drop Both Eyes BID  . divalproex  1,000 mg Oral QHS  . enoxaparin (LOVENOX) injection  40 mg Subcutaneous Q24H  . insulin aspart  0-9 Units Subcutaneous TID WC  . insulin aspart  4 Units Subcutaneous TID WC  . insulin glargine  25 Units Subcutaneous QHS  . latanoprost  1 drop Both Eyes QHS  . levothyroxine  250 mcg Oral QAC breakfast  . lisinopril  40 mg Oral Daily  . mometasone-formoterol  2 puff Inhalation BID  . nicotine  14 mg Transdermal Daily  . OLANZapine  10 mg Oral QHS  . pantoprazole  40 mg Oral Daily  . polyethylene glycol  17 g Oral Daily  . sertraline  100 mg Oral Daily  . sodium chloride  3 mL Intravenous Q12H  . traZODone  100 mg Oral QHS   Continuous Infusions:   PRN Meds:.albuterol, colchicine, morphine injection, ondansetron (ZOFRAN) IV, ondansetron Assessment/Plan: #Acute on chronic pancreatitis- Pt presents with symptoms similar to prior episodes of acute pancreatitis; she has a history of EtOH abuse but none recently. Lipase 708 on admission (103 in 10/2012). Abdominal u/s in 06/2010 showed no gallstones. CT scan in 06/2012 showed no evidence of chronic pancreatitis. Of note, pt takes HCTZ and Depakote at home (has been on both for years) both of which have been implicated in pancreatitis. Her UDS was positive for cocaine which certainly may have contributed to the problem; there are several case reports making this association. UA shows glucose and small LE. No dysuria. WBC wnl. Mag and Phos wnl. Lipid panel showed TC 218, TG 156, LDL 139. - Tolerating a regular diet - Restarting appropriate po home medications - Morphine 2mg  q4h prn pain  - Zofran prn nausea  - Will discontinue her fibrates, sucralfate, Flovent, and HCTZ on discharge as these have been shown to contribute to pancreatitis; her BP has been stable at 140s/80s here. - Medically stable for discharge to  home  #Hypothyroidism- TSH 56, on levothyroxine 250 mg daily at home. Free T4 here is 0.65 (low). It is unclear how compliant she has been with the Synthroid. She told me one morning that she had ran out. Today she says she has a few pills left. We talked about taking the medicine first thing in the morning on an empty stomach and waiting a full hour before eating. - Continue home synthroid, with outpatient follow up to increase dose if needed  #DM2- A1C 11.4; BG 292 on admission. On Lantus 20 units daily at home but she self reports that her BGs have been  running high at home (usually 200-320s). No episodes of hypoglycemia. Pt has peripheral neuropathy.  - Appreciate diabetes coordinator recs - Changing insulin regimen to Lantus 25 units daily with Novolog 4 units TID AC and explained to the patient that she will be discharged on this - SSI - CBGs AC/QHS  #HTN- stable (140s80s); pt has taken HCTZ since 2008. - Will continue to trial her off HCTZ after discharge since could be contributing to pancreatitis - Continue home lisinopril  #HLD- LDL 139, triglycerides 156; on atorvastatin, gemfibrozil, fenofibrate at home  - Continue home statin - Will discontinue her fibrates on discharge as these have also been shown to contribute to pancreatitis  #Chronic back pain- see HPI, concern for narcotic seeking behavior, did not go to pain clinic appointment earlier this month. She asks every morning for pain medication. - Analgesia per above  - Per chart review of clinic notes, this patient is to get NO narcotics outpatient, she needs to follow-up with a pain clinic (she was referred but no-showed); I will give her enough oxycodone to get her through to her appointment at the [facility] on Friday, but will not prescribe her a month's supply as she is requesting  #Schizophrenia/bipolar disorder/depression- Stable, managed by Dr. Carlynn Spry at [facility]. - Continue home Depakote, olanzapine - When she leaves,  will route discharge summary to Dr Carlynn Spry and ask him/her to look into her Depakote as a cause of recurrent pancreatitis  #Gout- stable  - Continue home allopurinol, colchicine prn - Wants information on "dos and don'ts of gout" at discharge, will provide  #Tobacco abuse- smokes 1-2 cigarettes/day, uses patch at home  -nicotine patch  -she was given smoking cessation counseling by the nurse  #DVT PPX- lovenox   #Code status- full code   Dispo: Disposition is deferred at this time, awaiting improvement of current medical problems.  Anticipated discharge in approximately 1-3 day(s).   The patient does have a current PCP Windell Hummingbird, MD) and does need an [facility] hospital follow-up appointment after discharge.  The patient does not have transportation limitations that hinder transportation to clinic appointments.  .Services Needed at time of discharge: Y = Yes, Blank = No PT:   OT:   RN:   Equipment:   Other:     LOS: 3 days   Vivi Barrack, MD 02/25/2013, 9:54 AM

## 2013-02-25 NOTE — Discharge Summary (Signed)
Name: [name] MRN: [id number] DOB: September 05, 1955 57 y.o. PCP: Windell Hummingbird, MD  Date of Admission: 02/22/2013 11:48 PM Date of Discharge: 02/25/2013 Attending Physician:   Discharge Diagnosis:  1. Acute pancreatitis 2. Hypothyroidism 3. DM2 4. HTN 5. HLD 6. Chronic back pain 7. Schizophrenia/bipolar disorder/depression 8. Gout  Discharge Medications:   Medication List    STOP taking these medications       fenofibrate 54 MG tablet     fluticasone 44 MCG/ACT inhaler  Commonly known as:  FLOVENT HFA     gemfibrozil 600 MG tablet  Commonly known as:  LOPID     hydrochlorothiazide 25 MG tablet  Commonly known as:  HYDRODIURIL     sucralfate 1 GM/10ML suspension  Commonly known as:  CARAFATE      TAKE these medications       albuterol 108 (90 BASE) MCG/ACT inhaler  Commonly known as:  PROVENTIL HFA;VENTOLIN HFA  Inhale 2 puffs into the lungs every 6 (six) hours as needed for wheezing.     allopurinol 300 MG tablet  Commonly known as:  ZYLOPRIM  Take 300 mg by mouth daily.     aspirin EC 81 MG tablet  Take 1 tablet (81 mg total) by mouth daily.     atorvastatin 80 MG tablet  Commonly known as:  LIPITOR  Take 1 tablet (80 mg total) by mouth daily at 6 PM.     brimonidine 0.2 % ophthalmic solution  Commonly known as:  ALPHAGAN  Place 1 drop into both eyes 2 (two) times daily.     colchicine 0.6 MG tablet  Take 0.6 mg by mouth daily as needed (for gout flare-ups).     diazepam 5 MG tablet  Commonly known as:  VALIUM  Take 5 mg by mouth every 12 (twelve) hours as needed for anxiety.     divalproex 500 MG 24 hr tablet  Commonly known as:  DEPAKOTE ER  Take 1,000 mg by mouth at bedtime.     Fluticasone-Salmeterol 250-50 MCG/DOSE Aepb  Commonly known as:  ADVAIR DISKUS  Inhale 1 puff into the lungs 2 times daily at 12 noon and 4 pm.     glipiZIDE 10 MG tablet  Commonly known as:  GLUCOTROL  Take 10 mg by mouth daily.     insulin aspart 100  UNIT/ML injection  Commonly known as:  novoLOG  Inject 4 Units into the skin 3 (three) times daily with meals.     insulin glargine 100 UNIT/ML injection  Commonly known as:  LANTUS  Inject 0.25 mLs (25 Units total) into the skin at bedtime.     Insulin Syringe-Needle U-100 31G X 15/64" 0.3 ML Misc  1 Package by Does not apply route daily.     latanoprost 0.005 % ophthalmic solution  Commonly known as:  XALATAN  Place 1 drop into both eyes at bedtime.     levothyroxine 125 MCG tablet  Commonly known as:  SYNTHROID, LEVOTHROID  Take 250 mcg by mouth daily before breakfast.     lisinopril 40 MG tablet  Commonly known as:  PRINIVIL,ZESTRIL  Take 40 mg by mouth daily.     nicotine 14 mg/24hr patch  Commonly known as:  NICODERM CQ - dosed in mg/24 hours  Place 1 patch onto the skin daily. Removed patch on Sunday  No patch on currently     nitroGLYCERIN 0.4 MG SL tablet  Commonly known as:  NITROSTAT  Place 1 tablet (0.4 mg  total) under the tongue every 5 (five) minutes as needed for chest pain.     OLANZapine 10 MG tablet  Commonly known as:  ZYPREXA  Take 10 mg by mouth at bedtime.     ondansetron 4 MG disintegrating tablet  Commonly known as:  ZOFRAN-ODT  Take 1 tablet (4 mg total) by mouth every 8 (eight) hours as needed for nausea.     Oxycodone HCl 10 MG Tabs  Take 1 tablet (10 mg total) by mouth every 6 (six) hours as needed (for pain).     pantoprazole 40 MG tablet  Commonly known as:  PROTONIX  Take 1 tablet (40 mg total) by mouth daily.     sertraline 25 MG tablet  Commonly known as:  ZOLOFT  Take 100 mg by mouth daily.     traZODone 100 MG tablet  Commonly known as:  DESYREL  Take 100 mg by mouth at bedtime.        Disposition and follow-up:   Ms.Arnelle T Wiehe was discharged from [facility] in Good condition.  At the hospital follow up visit please address:  1.  Blood pressure control off HCTZ.   2.  Does she understand new  medication regimen? We discontinued many drugs associated with pancreatitis, and started mealtime insulin coverage.  3.  Please address pain seeking behavior. Is there another pain clinic we can refer her to?  4.  Has she been taking her synthroid as instructed? She will need repeat thyroid function labs at a future appointment.  5.  Please arrange for her to see Lupita Leash soon.   6.  Labs / imaging needed at time of follow-up: None  7.  Pending labs/ test needing follow-up: None   Follow-up Appointments:     Follow-up Information   Follow up with Dow Adolph, MD On 02/27/2013. (@2 :30pm. This is your primary care clinic.)    Specialty:  Internal Medicine   Contact information:   [address] [place] [address] [phone]       Discharge Instructions: Discharge Orders   Future Appointments Provider Department Dept Phone   02/27/2013 2:30 PM Dow Adolph, MD [facility] [phone]   03/30/2013 3:00 PM Windell Hummingbird, MD [facility] [phone]   Future Orders Complete By Expires   Ambulatory referral to Nutrition and Diabetic Education  As directed    Diet - low sodium heart healthy  As directed    Increase activity slowly  As directed       Consultations:  None   Admission HPI:  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   Patient states she has had abdominal pain x 2 days- epigastric, constant, sharp, radiating around her sides and to back, associated with nausea/vomiting (similar to typical pancreatitis episodes). She vomited twice yesterday, clear NBNB. Pain worse  with lying or moving; she tried taking oxycodone (which she has for chronic back pain) with little relief. She has had "pretty good" oral intake, drinking water and milk at home, was eating well until yesterday when pain intensified. Denies fever, alcohol intake.   Patient has had narcotic seeking behavior at clinic appointments; she  recently tranferred her care from [facility], saw Dr. Shirlee Latch in July and Dr. Kirtland Bouchard twice in August, on all three visits requesting pain meds. Did sign pain contract in August. She left clinic without providing a urine sample at early August appt and had a UDS negative for oxycodone on 01/23/13. She missed her appointment with pain clinic (Dr. Jordan Likes) earlier this month, states she will need more narcotics at discharge until she can find a new pain clinic as Dr. Jordan Likes does not accept patients who no show to first appointment. She is on disability for chronic back pain (following fall).   Patient had a sleep study on 9/18 and colonoscopy earlier this month (one polyp removed).    Hospital Course by problem list:   1. Acute pancreatitis - Patient presents with symptoms similar to prior episodes of acute pancreatitis. All vital signs stable and WBC wnl. She has a history of EtOH abuse, but none recently. Lipase 708 on admission (103 in 10/2012). Abdominal ultrasound in 06/2010 showed no gallstones. CT scan in 06/2012 showed no evidence of chronic pancreatitis such as calcifications or pseudocyst. Of note, patient takes HCTZ and Depakote at home, both of which have been implicated in pancreatitis. A few of her other medications are also potential culprits, including fibrates, Flovent, sucralfate. Her UDS was positive for cocaine, which certainly may have contributed to the problem; there are several case reports making associations between cocaine and pancreatitis. Lipid panel showed TC 218, TG 156, LDL 139. She was fluid resuscitated with normal saline. Her pain was managed with  morphine. We advanced her diet as tolerated. At discharge we discontinued her fibrates (she was on 2), sucralfate, Flovent, and HCTZ. Her BP was stable throughout her hospitalization at 140s/80s off the HCTZ, but this should be monitored closely as an outpatient. I did not touch her Depakote; this note will be forwarded to her psychiatrist for his consideration.  2. Hypothyroidism- TSH 56, on a large dose of levothyroxine 250 mg daily at home. Free T4 here is 0.65 (low). It is unclear how compliant she has been with the Synthroid. She told me one morning that she had ran out. Another day she said she has a few pills left. We talked about taking the medicine first thing in the morning on an empty stomach and waiting a full hour before eating. I did reorder it for her. Please follow up and titrate as needed.  3. DM2 - A1C 11.4; BG 292 on admission. On Lantus 20 units daily at home, but she self-reports that her BGs run high at home (usually 200-320s). Denied episodes of hypoglycemia. Patient has peripheral neuropathy. Diabetes coordinator saw her in the hospital, and based on her recommendations we changed the patient's insulin regimen to Lantus 25 units daily with Novolog 4 units TID AC. She was also referred for outpatient diabetes counselor follow up.  4. HTN - Stable in the 140s/80s throughout her hospitalization while holding her HCTZ due to concerns for it exacerbating or causing recurrent pancreatitis. She was instructed not to take this medication any more upon discharge.  5. HLD - LDL 139, triglycerides 156. Was on atorvastatin, gemfibrozil, fenofibrate at home. We continued her home statin, but discontinued her fibrates as these have been shown to contribute to pancreatitis. It is not clear why she was on 2 fibrates in the first place. Her TGs in the past have been 145, 160, 164, 253, 156.  6. Chronic back pain - Stable. Patient does have a history  of narcotic seeking behavior. She no-showed to her  initial pain clinic appointment earlier this month, and was therefore discharged from their clinic. She asked me every morning for outpatient pain medication, saying she was going on a trip to [place] for her niece's sweet sixteen party and needed at least a month's supply of oxycodone to get her through. I scheduled her a close follow up appointment at the [facility] 2 days s/p discharge and only gave her oxycodone #14 to get her through to that appointment.  7. Schizophrenia/bipolar disorder/depression - Stable, managed by Dr. Midge Aver at [facility]. Will route discharge summary and ask him to consider Depakote as a cause of recurrent pancreatitis.  8. Gout - Stable. Continued home allopurinol, colchicine prn. She was given information on [address] and don'ts of gout" at discharge as she requested.   Discharge Vitals:   BP 111/63  Pulse 76  Temp(Src) 98.1 F (36.7 C) (Oral)  Resp 18  Ht 5\' 2"  (1.575 m)  Wt 195 lb 12.8 oz (88.814 kg)  BMI 35.8 kg/m2  SpO2 90%  Discharge Labs:  Results for orders placed during the hospital encounter of 02/22/13 (from the past 24 hour(s))  GLUCOSE, CAPILLARY     Status: Abnormal   Collection Time    02/24/13 11:57 AM      Result Value Range   Glucose-Capillary 273 (*) 70 - 99 mg/dL   Comment 1 Notify RN    GLUCOSE, CAPILLARY     Status: Abnormal   Collection Time    02/24/13  1:27 PM      Result Value Range   Glucose-Capillary 350 (*) 70 - 99 mg/dL   Comment 1 Documented in Chart     Comment 2 Notify RN    GLUCOSE, CAPILLARY     Status: Abnormal   Collection Time    02/24/13  5:07 PM      Result Value Range   Glucose-Capillary 219 (*) 70 - 99 mg/dL   Comment 1 Notify RN    GLUCOSE, CAPILLARY     Status: Abnormal   Collection Time    02/24/13 10:05 PM      Result Value Range   Glucose-Capillary 218 (*) 70 - 99 mg/dL   Comment 1 Notify RN    BASIC METABOLIC PANEL     Status: Abnormal   Collection Time    02/25/13  5:45 AM      Result Value Range    Sodium 135  135 - 145 mEq/L   Potassium 4.0  3.5 - 5.1 mEq/L   Chloride 99  96 - 112 mEq/L   CO2 25  19 - 32 mEq/L   Glucose, Bld 315 (*) 70 - 99 mg/dL   BUN 13  6 - 23 mg/dL   Creatinine, Ser 6.21 (*) 0.50 - 1.10 mg/dL   Calcium 8.7  8.4 - 30.8 mg/dL   GFR calc non Af Amer 51 (*) >90 mL/min   GFR calc Af Amer 60 (*) >90 mL/min  GLUCOSE, CAPILLARY     Status: Abnormal   Collection Time    02/25/13  7:33 AM      Result Value Range   Glucose-Capillary 371 (*) 70 - 99 mg/dL   Comment 1 Notify RN      Signed: Vivi Barrack, MD 02/25/2013, 11:00 AM   Time Spent on Discharge: 30 minutes Services Ordered on Discharge: None Equipment Ordered on Discharge: None

## 2013-02-25 NOTE — Progress Notes (Signed)
Discharge instructions reviewed with patient, including discharge instructions. Questions answered. Patient able to verbalize changes in medications. Printed AVS given to patient. Premedicated for pain for drive home. Patient discharged to home via wheelchair.  Accompanied by family

## 2013-02-27 ENCOUNTER — Encounter: Payer: Self-pay | Admitting: Internal Medicine

## 2013-02-27 ENCOUNTER — Ambulatory Visit (INDEPENDENT_AMBULATORY_CARE_PROVIDER_SITE_OTHER): Payer: PRIVATE HEALTH INSURANCE | Admitting: Internal Medicine

## 2013-02-27 VITALS — BP 166/94 | HR 94 | Temp 97.3°F | Ht 62.0 in | Wt 196.8 lb

## 2013-02-27 DIAGNOSIS — I10 Essential (primary) hypertension: Secondary | ICD-10-CM

## 2013-02-27 DIAGNOSIS — E119 Type 2 diabetes mellitus without complications: Secondary | ICD-10-CM

## 2013-02-27 DIAGNOSIS — K859 Acute pancreatitis without necrosis or infection, unspecified: Secondary | ICD-10-CM

## 2013-02-27 DIAGNOSIS — IMO0002 Reserved for concepts with insufficient information to code with codable children: Secondary | ICD-10-CM

## 2013-02-27 DIAGNOSIS — G8929 Other chronic pain: Secondary | ICD-10-CM

## 2013-02-27 MED ORDER — AMLODIPINE BESYLATE 5 MG PO TABS: 5.0000 mg | ORAL_TABLET | Freq: Every day | ORAL | Status: DC

## 2013-02-27 NOTE — Assessment & Plan Note (Signed)
BP elevated today. HCTZ was d/ced on 02/22/2013 due to ? Related to acute pancreatitis. She reports compliance with her Lisinopril 40 mg daily.   Plan  - will add Amlodipine 5 mg daily  - diuretic will not be a good option in setting on pancreatitis - f/u in 2 weeks for further adjustment  - advised her to check her BP at home and bring log.

## 2013-02-27 NOTE — Progress Notes (Signed)
Patient ID: [name], female   DOB: [birth year], 57 y.o.   MRN: [id number]   Subjective:   HPI: [name] is a 57 y.o. woman with past medical history of asthma/COPD, diabetes, hyperlipidemia, hypothyroidism, chronic pain due to chronic pancreatitis, and low back pain among other problems, presents to the clinic requesting for refills of his Oxycodone.  She was discharged on 9/28 after an episode of acute on chronic pancreatitis. UDS was positive with cocaine. She has ran out of her pain medication and she reports that she can not get into the pain clinic after missing her appt due to a miscommunication btn her and [name] office. She request for more pain medications.  She also reports that her legs have been more swollen over the last several days.   DM2, fairly controlled -  Lab Results  Component Value Date   HGBA1C 8.8* 11/07/2012   Patient checking blood sugars 3 times daily, before breakfast and before lunch and before dinner. She has no meter today. Reports fasting blood sugars of 200-400 mg/dL. Currently taking Lantus 25u qhs and Novolog 4u tid. Patient misses doses 0 x per week on average.  0 hypoglycemic episodes since last visit. Denies assisted hypoglycemia or recently hospitalizations for either hyper or hypoglycemia. denies polyuria, polydipsia, nausea, vomiting, diarrhea.  Kindly see the A&P for the status of the pt's chronic medical problems.   Past Medical History  Diagnosis Date  . Asthma     past 10 years  . Diabetes mellitus     past 5 years  . Glaucoma     dx 06/2011-[facility]  . Hypertension   . Hyperlipidemia   . Hyperthyroidism   . History of TIA (transient ischemic attack)     01/06/2011  . Pancreatitis   . Gout   . Depression   . Renal disorder     chronic kidney disease, stage 3  . Chronic pain   . Glaucoma   . Arthritis     djd -lower back, left knee  . Degenerative disc disease   . Degenerative disc disease   . Bipolar  disorder   . Gastritis    Current Outpatient Prescriptions  Medication Sig Dispense Refill  . allopurinol (ZYLOPRIM) 300 MG tablet Take 300 mg by mouth daily.      Marland Kitchen amLODipine (NORVASC) 10 MG tablet Take 10 mg by mouth. Take 10 mg by mouth once daily.      Marland Kitchen aspirin EC 81 MG tablet Take 81 mg by mouth daily.      Marland Kitchen atorvastatin (LIPITOR) 80 MG tablet Take 1 tablet (80 mg total) by mouth daily at 6 PM.  30 tablet  1  . brimonidine (ALPHAGAN) 0.2 % ophthalmic solution Place 1 drop into both eyes daily. Administer 1 drop to both eyes once daily.  5 mL  0  . colchicine 0.6 MG tablet Take 0.6 mg by mouth daily as needed (for gout flare-ups).       . divalproex (DEPAKOTE ER) 500 MG 24 hr tablet Take 1,000 mg by mouth at bedtime.      .                . gemfibrozil (LOPID) 600 MG tablet Take 1 tablet (600 mg total) by mouth 2 (two) times daily before a meal.  60 tablet  1        . latanoprost (XALATAN) 0.005 % ophthalmic solution Place 1 drop into both eyes at bedtime.  2.5 mL  0  . levothyroxine (SYNTHROID, LEVOTHROID) 125 MCG tablet Take 250 mcg by mouth daily before breakfast.      . lisinopril (PRINIVIL,ZESTRIL) 40 MG tablet TAKE 1 TABLET BY MOUTH EVERY DAY FOR BLOOD PRESSURE  30 tablet  0  . nicotine (NICODERM CQ - DOSED IN MG/24 HOURS) 14 mg/24hr patch Place 1 patch onto the skin daily.      . nitroGLYCERIN (NITROSTAT) 0.4 MG SL tablet Place 1 tablet (0.4 mg total) under the tongue every 5 (five) minutes as needed for chest pain.  30 tablet  3  . OLANZapine (ZYPREXA) 10 MG tablet Take 10 mg by mouth at bedtime.      . ondansetron (ZOFRAN-ODT) 4 MG disintegrating tablet Take 1 tablet (4 mg total) by mouth every 8 (eight) hours as needed for nausea.  20 tablet  0  . Oxycodone HCl 10 MG TABS Take 1 tablet (10 mg total) by mouth every 8 (eight) hours as needed.  90 tablet  0  . pantoprazole (PROTONIX) 40 MG tablet Take 1 tablet (40 mg total) by mouth daily.  30 tablet  5  . sertraline (ZOLOFT)  25 MG tablet Take 50 mg by mouth daily.      Marland Kitchen     0  . traZODone (DESYREL) 100 MG tablet Take 100 mg by mouth at bedtime.      Marland Kitchen albuterol (PROVENTIL HFA;VENTOLIN HFA) 108 (90 BASE) MCG/ACT inhaler Inhale 2 puffs into the lungs every 6 (six) hours as needed for wheezing.  1 Inhaler  2  . Fluticasone-Salmeterol (ADVAIR DISKUS) 250-50 MCG/DOSE AEPB Inhale 1 puff into the lungs 2 times daily at 12 noon and 4 pm.  60 each  0  . insulin glargine (LANTUS) 100 units/mL SOLN Inject 20 Units into the skin daily at 10 pm.  10 mL  2  . Insulin Syringe-Needle U-100 31G X 15/64" 0.3 ML MISC 1 Package by Does not apply route daily.  100 each  12   No current facility-administered medications for this visit.   Family History  Problem Relation Age of Onset  . Arthritis Mother   . Diabetes Mother   . Hypertension Mother   . Kidney disease Mother   . Heart disease Mother     died 59  . Heart disease      mother & father  . Hypertension      mother & father  . Colon cancer Neg Hx   . Prostate cancer Neg Hx   . Breast cancer Neg Hx   . Hypertension Sister   . Hypertension Brother   . Kidney disease Brother    History   Social History  . Marital Status: Single    Spouse Name: N/A    Number of Children: N/A  . Years of Education: N/A   Occupational History  . Disability    Social History Main Topics  . Smoking status: Current Every Day Smoker -- 0.01 packs/day for 30 years    Types: Cigarettes  . Smokeless tobacco: Never Used     Comment: 1 cigs/uses patches.  . Alcohol Use: No  . Drug Use: No  . Sexual Activity: None   Other Topics Concern  . None   Social History Narrative            Review of Systems: Constitutional: Denies fever, chills, diaphoresis, appetite change and fatigue.  Respiratory: Denies SOB, DOE, cough, chest tightness, and wheezing.  Cardiovascular: No chest pain, palpitations and  leg swelling.  Gastrointestinal: chronic abdominal pain. Denies nausea,  vomiting, bloody stools Genitourinary: No dysuria, frequency, hematuria, or flank pain.  Musculoskeletal: chronic low back pain.  Objective:  Physical Exam: Filed Vitals:   01/23/13 1334  BP: 171/90  Pulse: 84  Temp: 97.4 F (36.3 C)  TempSrc: Oral  Height: 5\' 2"  (1.575 m)  Weight: 192 lb 4.8 oz (87.227 kg)  SpO2: 99%   General: Well nourished. No acute distress. Lungs: CTA bilaterally. Heart: RRR; no extra sounds or murmurs  Abdomen: Non-distended, normal BS, soft, nontender; no hepatosplenomegaly  Extremities:Minimal pedal edema. No joint swelling or tenderness. Neurologic: Alert and oriented x3. No obvious neurologic deficits. Uses a cane with antalgic gait   Assessment & Plan:  I have discussed my assessment and plan my attending in clinic, Maria. Dalphine Handing  as detailed under problem based charting.

## 2013-02-27 NOTE — Patient Instructions (Signed)
We will try to get into a pain clinic  We will start you on a Amlodipine for your blood pressure  Please come back in 2 weeks to check you blood pressure.

## 2013-02-27 NOTE — Assessment & Plan Note (Signed)
Pt with many red flags for chronic opiates. See FYI.  Plan  - will refer her to pain clinic  - no chronic opiates from our clinic.

## 2013-02-27 NOTE — Assessment & Plan Note (Addendum)
Pt has no meter today. Recent A1c with deterioration of her glycemic control from 8.8, for months ago to 11.6%.   Plan  - I will not change her medications today without review of her glucose meter.  - will review meter when she comes back in 2 weeks - Continue with Lantus 25U daily and NovoLog insulin 4 units 3 times a day with meals - I encouraged her to bring her glucose meter on the next visit in 2 weeks.

## 2013-03-02 NOTE — Progress Notes (Signed)
About medication list: The patient takes colchicine prn, gemfibrozil has been stopped and her current medication list indicates that she is on Atorvastatin.   Case discussed with Dr. Zada Girt soon after the resident saw the patient.  We reviewed the resident's history and exam and pertinent patient test results.  I agree with the assessment, diagnosis, and plan of care documented in the resident's note.

## 2013-03-10 ENCOUNTER — Encounter (HOSPITAL_COMMUNITY): Payer: Self-pay | Admitting: Emergency Medicine

## 2013-03-10 ENCOUNTER — Emergency Department (HOSPITAL_COMMUNITY)
Admission: EM | Admit: 2013-03-10 | Discharge: 2013-03-10 | Disposition: A | Discharge status: Home / Self Care | Payer: PRIVATE HEALTH INSURANCE | Attending: Emergency Medicine | Admitting: Emergency Medicine

## 2013-03-10 ENCOUNTER — Emergency Department (HOSPITAL_COMMUNITY): Payer: PRIVATE HEALTH INSURANCE

## 2013-03-10 DIAGNOSIS — Z8673 Personal history of transient ischemic attack (TIA), and cerebral infarction without residual deficits: Secondary | ICD-10-CM | POA: Insufficient documentation

## 2013-03-10 DIAGNOSIS — M129 Arthropathy, unspecified: Secondary | ICD-10-CM | POA: Insufficient documentation

## 2013-03-10 DIAGNOSIS — E785 Hyperlipidemia, unspecified: Secondary | ICD-10-CM | POA: Insufficient documentation

## 2013-03-10 DIAGNOSIS — E0590 Thyrotoxicosis, unspecified without thyrotoxic crisis or storm: Secondary | ICD-10-CM | POA: Insufficient documentation

## 2013-03-10 DIAGNOSIS — Z860100 Personal history of colon polyps, unspecified: Secondary | ICD-10-CM | POA: Insufficient documentation

## 2013-03-10 DIAGNOSIS — F319 Bipolar disorder, unspecified: Secondary | ICD-10-CM | POA: Insufficient documentation

## 2013-03-10 DIAGNOSIS — F17200 Nicotine dependence, unspecified, uncomplicated: Secondary | ICD-10-CM | POA: Insufficient documentation

## 2013-03-10 DIAGNOSIS — R1013 Epigastric pain: Secondary | ICD-10-CM

## 2013-03-10 DIAGNOSIS — I129 Hypertensive chronic kidney disease with stage 1 through stage 4 chronic kidney disease, or unspecified chronic kidney disease: Secondary | ICD-10-CM | POA: Insufficient documentation

## 2013-03-10 DIAGNOSIS — H538 Other visual disturbances: Secondary | ICD-10-CM | POA: Insufficient documentation

## 2013-03-10 DIAGNOSIS — M109 Gout, unspecified: Secondary | ICD-10-CM | POA: Insufficient documentation

## 2013-03-10 DIAGNOSIS — E119 Type 2 diabetes mellitus without complications: Secondary | ICD-10-CM | POA: Insufficient documentation

## 2013-03-10 DIAGNOSIS — M7989 Other specified soft tissue disorders: Secondary | ICD-10-CM | POA: Insufficient documentation

## 2013-03-10 DIAGNOSIS — N183 Chronic kidney disease, stage 3 unspecified: Secondary | ICD-10-CM | POA: Insufficient documentation

## 2013-03-10 DIAGNOSIS — J441 Chronic obstructive pulmonary disease with (acute) exacerbation: Secondary | ICD-10-CM | POA: Insufficient documentation

## 2013-03-10 DIAGNOSIS — Z79899 Other long term (current) drug therapy: Secondary | ICD-10-CM | POA: Insufficient documentation

## 2013-03-10 DIAGNOSIS — F209 Schizophrenia, unspecified: Secondary | ICD-10-CM | POA: Insufficient documentation

## 2013-03-10 DIAGNOSIS — G8929 Other chronic pain: Secondary | ICD-10-CM | POA: Insufficient documentation

## 2013-03-10 DIAGNOSIS — R112 Nausea with vomiting, unspecified: Secondary | ICD-10-CM | POA: Insufficient documentation

## 2013-03-10 DIAGNOSIS — Z7982 Long term (current) use of aspirin: Secondary | ICD-10-CM | POA: Insufficient documentation

## 2013-03-10 DIAGNOSIS — R197 Diarrhea, unspecified: Secondary | ICD-10-CM | POA: Insufficient documentation

## 2013-03-10 DIAGNOSIS — Z794 Long term (current) use of insulin: Secondary | ICD-10-CM | POA: Insufficient documentation

## 2013-03-10 LAB — BASIC METABOLIC PANEL
BUN: 10 mg/dL (ref 6–23)
CO2: 24 mEq/L (ref 19–32)
Calcium: 9.4 mg/dL (ref 8.4–10.5)
Chloride: 96 mEq/L (ref 96–112)
Creatinine, Ser: 0.98 mg/dL (ref 0.50–1.10)
GFR calc Af Amer: 74 mL/min — ABNORMAL LOW (ref >90)
GFR calc non Af Amer: 64 mL/min — ABNORMAL LOW (ref >90)
Glucose, Bld: 474 mg/dL — ABNORMAL HIGH (ref 70–99)
Potassium: 3.6 mEq/L (ref 3.5–5.1)
Sodium: 134 mEq/L — ABNORMAL LOW (ref 135–145)

## 2013-03-10 LAB — URINE MICROSCOPIC-ADD ON

## 2013-03-10 LAB — GLUCOSE, CAPILLARY
Glucose-Capillary: 405 mg/dL — ABNORMAL HIGH (ref 70–99)
Glucose-Capillary: 372 mg/dL — ABNORMAL HIGH (ref 70–99)
Glucose-Capillary: 252 mg/dL — ABNORMAL HIGH (ref 70–99)

## 2013-03-10 LAB — CBC
HCT: 38.6 % (ref 36.0–46.0)
Hemoglobin: 13.1 g/dL (ref 12.0–15.0)
MCH: 28.5 pg (ref 26.0–34.0)
MCHC: 33.9 g/dL (ref 30.0–36.0)
MCV: 83.9 fL (ref 78.0–100.0)
Platelets: 320 10*3/uL (ref 150–400)
RBC: 4.6 MIL/uL (ref 3.87–5.11)
RDW: 15.6 % — ABNORMAL HIGH (ref 11.5–15.5)
WBC: 9.3 10*3/uL (ref 4.0–10.5)

## 2013-03-10 LAB — POCT I-STAT TROPONIN I: Troponin i, poc: 0 ng/mL (ref 0.00–0.08)

## 2013-03-10 LAB — URINALYSIS, ROUTINE W REFLEX MICROSCOPIC
Bilirubin Urine: NEGATIVE
Glucose, UA: >1000 mg/dL — AB
Ketones, ur: NEGATIVE mg/dL
Nitrite: NEGATIVE
Specific Gravity, Urine: 1.035 — ABNORMAL HIGH (ref 1.005–1.030)
pH: 5.5 (ref 5.0–8.0)

## 2013-03-10 LAB — PRO B NATRIURETIC PEPTIDE: Pro B Natriuretic peptide (BNP): 47.4 pg/mL (ref 0–125)

## 2013-03-10 LAB — LIPASE, BLOOD: Lipase: 38 U/L (ref 11–59)

## 2013-03-10 MED ORDER — HYDROMORPHONE HCL PF 1 MG/ML IJ SOLN
1.0000 mg | Freq: Once | INTRAMUSCULAR | Status: AC
  Administered 2013-03-10: 1 mg via INTRAVENOUS
  Filled 2013-03-10: qty 1

## 2013-03-10 MED ORDER — GI COCKTAIL ~~LOC~~
30.0000 mL | Freq: Once | ORAL | Status: AC
  Administered 2013-03-10: 30 mL via ORAL
  Filled 2013-03-10: qty 30

## 2013-03-10 MED ORDER — FLUCONAZOLE 150 MG PO TABS: 150.0000 mg | ORAL_TABLET | Freq: Once | ORAL | Status: DC

## 2013-03-10 MED ORDER — SODIUM CHLORIDE 0.9 % IV BOLUS (SEPSIS)
1000.0000 mL | Freq: Once | INTRAVENOUS | Status: AC
  Administered 2013-03-10: 1000 mL via INTRAVENOUS

## 2013-03-10 NOTE — ED Notes (Signed)
Pt w/ hx of pancreatitis c/o central chest pain since Sunday.  States that she has been having a homeless person live with her who rolls her own cigarettes.  States that she has been taking a few puffs of this lady's cigarettes and since been having chest pain.  States that this morning she caught the lady rolling tobacco with cocaine.

## 2013-03-10 NOTE — ED Provider Notes (Signed)
CSN: [id number]     Arrival date & time 03/10/13  1656 History   First MD Initiated Contact with Patient 03/10/13 1730     Chief Complaint  Patient presents with  . Chest Pain   (Consider location/radiation/quality/duration/timing/severity/associated sxs/prior Treatment) HPI  57 year old female with history of alcohol-induced pancreatitis, diabetes, chronic pain, gastritis, bipolar, COPD, schizophrenia, presents complaining of chest pain and epigastric pain radiating to the back. She has also had nausea with one episode of vomiting mucous today. She has a history of several pancreatitis flares recently, with a hospitalization around 2 months ago. She states that the epigastric pain is most concerning to her, and it has been present for 1.5 days, but did not become severe (9/10) until 3-4 hours ago. The pain is aching in nature and radiates to her back. It is similar to other pancreatitis flares. In addition, for the past 1-2 days she has noted increased blood glucose with associated blurred vision, and shortness of breath, cough with production of yellow sputum, night sweats, diarrhea and mild swelling in her right ankle. She does note that for the past 9 months, she has been smoking hand-rolled cigarettes with a homeless woman who has been staying with her, and she just caught the woman putting cocaine in these cigarettes this morning. She says the chest pains have been occuring after she smokes these cigarettes. She does state that she was recently started on amlodipine and her insulin was changed. She denies headache and fevers. She denies history of MI but she has had a history of 2 TIAs in 2012. She does have degenerative disc disease of her lower back. 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  Past Medical History  Diagnosis Date  . Asthma     past 10 years  . Diabetes mellitus     past 5 years  . Glaucoma     dx  06/2011-[facility]  . Hypertension   . Hyperlipidemia   . Hyperthyroidism   . History of TIA (transient ischemic attack)     01/06/2011, x2  . Pancreatitis   . Gout   . Depression   . Renal disorder     chronic kidney disease, stage 3  . Chronic pain   . Glaucoma   . Arthritis     djd -lower back, left knee  . Degenerative disc disease   . Bipolar disorder   . Gastritis   . COPD (chronic obstructive pulmonary disease)   . Schizophrenia   . 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 Colon polyps     colonoscopy 01/2013   . Fatty liver     mild CT 06/2012   Past Surgical History  Procedure Laterality Date  . Vaginal hysterectomy  1990  . Abdominal hysterectomy      still has ovaries    Family History  Problem Relation Age of Onset  . Arthritis Mother   . Diabetes Mother   . Hypertension Mother   . Kidney disease Mother   . Heart disease Mother     died 26  . Heart disease      mother & father  . Hypertension      mother & father  . Colon cancer Neg Hx   . Prostate cancer Neg Hx   . Breast cancer Neg Hx   . Stomach cancer Neg Hx   . Hypertension Sister   . Hypertension Brother   .  Kidney disease Brother    History  Substance Use Topics  . Smoking status: Current Every Day Smoker -- 0.01 packs/day for 30 years    Types: Cigarettes  . Smokeless tobacco: Never Used     Comment: smokes 2 cigs daily/uses patches.  . Alcohol Use: No   OB History   Grav Para Term Preterm Abortions TAB SAB Ect Mult Living                 Review of Systems  All other systems reviewed and are negative.    Allergies  Hydrocodone-acetaminophen; Metformin; Metformin and related; and Vicodin  Home Medications   Current Outpatient Rx  Name  Route  Sig  Dispense  Refill  . albuterol (PROVENTIL HFA;VENTOLIN HFA) 108 (90 BASE) MCG/ACT inhaler   Inhalation   Inhale 2 puffs into the lungs every 6 (six) hours as needed for wheezing.   1 Inhaler   2   .  allopurinol (ZYLOPRIM) 300 MG tablet   Oral   Take 300 mg by mouth daily.         Marland Kitchen amLODipine (NORVASC) 5 MG tablet   Oral   Take 1 tablet (5 mg total) by mouth daily.   30 tablet   0   . aspirin EC 81 MG tablet   Oral   Take 1 tablet (81 mg total) by mouth daily.   30 tablet   11   . atorvastatin (LIPITOR) 80 MG tablet   Oral   Take 1 tablet (80 mg total) by mouth daily at 6 PM.   30 tablet   1   . brimonidine (ALPHAGAN) 0.2 % ophthalmic solution   Both Eyes   Place 1 drop into both eyes 2 (two) times daily.         . colchicine 0.6 MG tablet   Oral   Take 0.6 mg by mouth daily as needed (for gout flare-ups).          . diazepam (VALIUM) 5 MG tablet   Oral   Take 5 mg by mouth every 12 (twelve) hours as needed for anxiety.         . divalproex (DEPAKOTE ER) 500 MG 24 hr tablet   Oral   Take 1,000 mg by mouth at bedtime.         . Fluticasone-Salmeterol (ADVAIR DISKUS) 250-50 MCG/DOSE AEPB   Inhalation   Inhale 1 puff into the lungs 2 times daily at 12 noon and 4 pm.   60 each   0   . insulin aspart (NOVOLOG) 100 UNIT/ML injection   Subcutaneous   Inject 4 Units into the skin 3 (three) times daily with meals.   1 vial   12   . insulin glargine (LANTUS) 100 UNIT/ML injection   Subcutaneous   Inject 0.25 mLs (25 Units total) into the skin at bedtime.   10 mL   12   . latanoprost (XALATAN) 0.005 % ophthalmic solution   Both Eyes   Place 1 drop into both eyes at bedtime.   2.5 mL   0   . levothyroxine (SYNTHROID, LEVOTHROID) 125 MCG tablet   Oral   Take 250 mcg by mouth daily before breakfast.         . lisinopril (PRINIVIL,ZESTRIL) 40 MG tablet   Oral   Take 40 mg by mouth daily.         . nicotine (NICODERM CQ - DOSED IN MG/24 HOURS) 14 mg/24hr patch   Transdermal  Place 1 patch onto the skin daily.          . nitroGLYCERIN (NITROSTAT) 0.4 MG SL tablet   Sublingual   Place 1 tablet (0.4 mg total) under the tongue every 5  (five) minutes as needed for chest pain.   30 tablet   3   . OLANZapine (ZYPREXA) 10 MG tablet   Oral   Take 10 mg by mouth at bedtime.         . Oxycodone HCl 10 MG TABS   Oral   Take 1 tablet (10 mg total) by mouth every 6 (six) hours as needed (for pain).   14 tablet   0   . pantoprazole (PROTONIX) 40 MG tablet   Oral   Take 1 tablet (40 mg total) by mouth daily.   30 tablet   11   . sertraline (ZOLOFT) 25 MG tablet   Oral   Take 100 mg by mouth daily.          . traZODone (DESYREL) 100 MG tablet   Oral   Take 100 mg by mouth at bedtime.          BP 133/92  Pulse 93  Temp(Src) 98.4 F (36.9 C) (Oral)  Resp 16  SpO2 100% Physical Exam  Nursing note and vitals reviewed. Constitutional: She appears well-developed and well-nourished. No distress.  Awake, alert, nontoxic appearance  HENT:  Head: Atraumatic.  Mouth/Throat: Oropharynx is clear and moist.  Eyes: Conjunctivae are normal. Right eye exhibits no discharge. Left eye exhibits no discharge.  Neck: Neck supple.  Cardiovascular: Normal rate and regular rhythm.   Pulmonary/Chest: Effort normal. No respiratory distress. She exhibits no tenderness.  Abdominal: Soft. There is tenderness (Mild epigastric tenderness without guarding or rebound tenderness. No Murphy sign, no McBurney's point.). There is no rebound.  Musculoskeletal: She exhibits no edema and no tenderness.  ROM appears intact, no obvious focal weakness  Neurological:  Mental status and motor strength appears intact  Skin: No rash noted.  Psychiatric: She has a normal mood and affect.    ED Course  Procedures (including critical care time)   Date: 03/10/2013  Rate: 91  Rhythm: normal sinus rhythm  QRS Axis: left  Intervals: normal  ST/T Wave abnormalities: nonspecific T wave changes  Conduction Disutrbances:none  Narrative Interpretation:   Old EKG Reviewed: unchanged    ,8:07 PM Pt here with multiple complaints and many  pertinent positive complaints.  Her primary concern is pancreatitis as this pain is similar to past.  She has normal lipase, afebrile, non surgical abdomen, and normal WBC at this time.  Will treat her pain with pain medication here.   She endorsed CP and SOB. This is ongoing for 1-2 days.  Her Troponin is negative and no significant ischemic changes noted on EKG.  CXR ordered, although this is likely atypical for ACS.  Pt endorse ankle swelling and SOB.  However pt has hx of COPD, no significant risk factors to suggest PE, PERC negative.  Pt currently in NAD, VSS, has multiple ER visits for abdominal pain in the past.  Her CBG is elevated today at 405.  No evidence to suggest DKA as pt has normal anion gap and no ketone in urine.  At this time, we plan to controlled her sxs and have pt have close f/u with her PCP for further care.    10:10 PM CXR shows worsening aeration along with cardiomegaly with mild vascular congestion.  No prior hx of  CHF.  Will check Pro BNP.  Continue to endorse pain, will give pain medication.    11:31 PM My attending has seen and evaluate pt and felt pt stable for discharge.  Pt request treatment for yeast infection.  Will prescribe diflucan 150mg  PO once.  Pt will have close f/u with PCP.    Labs Review Labs Reviewed  CBC - Abnormal; Notable for the following:    RDW 15.6 (*)    All other components within normal limits  BASIC METABOLIC PANEL - Abnormal; Notable for the following:    Sodium 134 (*)    Glucose, Bld 474 (*)    GFR calc non Af Amer 64 (*)    GFR calc Af Amer 74 (*)    All other components within normal limits  GLUCOSE, CAPILLARY - Abnormal; Notable for the following:    Glucose-Capillary 405 (*)    All other components within normal limits  URINALYSIS, ROUTINE W REFLEX MICROSCOPIC - Abnormal; Notable for the following:    APPearance CLOUDY (*)    Specific Gravity, Urine 1.035 (*)    Glucose, UA >1000 (*)    Hgb urine dipstick SMALL (*)     Leukocytes, UA SMALL (*)    All other components within normal limits  GLUCOSE, CAPILLARY - Abnormal; Notable for the following:    Glucose-Capillary 372 (*)    All other components within normal limits  URINE MICROSCOPIC-ADD ON - Abnormal; Notable for the following:    Squamous Epithelial / LPF MANY (*)    All other components within normal limits  GLUCOSE, CAPILLARY - Abnormal; Notable for the following:    Glucose-Capillary 252 (*)    All other components within normal limits  LIPASE, BLOOD  PRO B NATRIURETIC PEPTIDE  POCT I-STAT TROPONIN I   Imaging Review Dg Chest 2 View  03/10/2013   CLINICAL DATA:  Chest pain. As above.  EXAM: CHEST  2 VIEW  COMPARISON:  11/07/2012.  FINDINGS: Cardiomegaly. Calcified tortuous aorta. Moderate vascular congestion. No airspace consolidation or overt failure. Negative osseous structures.  IMPRESSION: Worsening aeration. Cardiomegaly with mild vascular congestion.   Electronically Signed   By: Davonna Belling M.D.   On: 03/10/2013 20:21    EKG Interpretation   None       MDM   1. Abdominal pain, epigastric    BP 153/81  Pulse 73  Temp(Src) 97.7 F (36.5 C) (Oral)  Resp 18  SpO2 96%  I have reviewed nursing notes and vital signs. I personally reviewed the imaging tests through PACS system  I reviewed available ER/hospitalization records thought the EMR     Fayrene Helper, [place] 03/10/13 2332

## 2013-03-11 NOTE — ED Provider Notes (Signed)
Medical screening examination/treatment/procedure(s) were conducted as a shared visit with non-physician practitioner(s) and myself.  I personally evaluated the patient during the encounter  I interviewed and examined the patient. Lungs are CTAB. Cardiac exam wnl. Abdomen soft w/ mild epig ttp. Pt has multiple complaints. She thinks her sx are c/w previous pancreatitis. There is also concern from previous documentation of malingering. She is not PERC neg as suggested by the PA's note d/t age >25, but I did not identify any RF's for PE in my discussion/exam. Her cp is constant and unwavering for 2 days which is atypical for a cardiac cause including [place]. Her BS has dec appropriately and she has gotten pain medicine here. Her labs/imaging are otherwise non-contrib. Will rec close f/u w/ pcp.   Junius Argyle, MD 03/11/13 1316

## 2013-03-27 NOTE — Addendum Note (Signed)
Addended by: Dorie Rank E on: 03/27/2013 01:14 PM   Modules accepted: Orders

## 2013-03-30 ENCOUNTER — Ambulatory Visit: Payer: Self-pay | Admitting: Internal Medicine

## 2013-04-02 ENCOUNTER — Telehealth: Payer: Self-pay | Admitting: Dietician

## 2013-04-05 ENCOUNTER — Encounter (HOSPITAL_COMMUNITY): Payer: Self-pay | Admitting: Emergency Medicine

## 2013-04-05 ENCOUNTER — Emergency Department (EMERGENCY_DEPARTMENT_HOSPITAL)
Admission: EM | Admit: 2013-04-05 | Discharge: 2013-04-06 | Disposition: A | Discharge status: Other Acute Inpatient Hospital | Payer: 59 | Source: Home / Self Care

## 2013-04-05 DIAGNOSIS — R45851 Suicidal ideations: Secondary | ICD-10-CM

## 2013-04-05 DIAGNOSIS — F332 Major depressive disorder, recurrent severe without psychotic features: Secondary | ICD-10-CM | POA: Diagnosis not present

## 2013-04-05 DIAGNOSIS — F329 Major depressive disorder, single episode, unspecified: Secondary | ICD-10-CM | POA: Diagnosis not present

## 2013-04-05 LAB — RAPID URINE DRUG SCREEN, HOSP PERFORMED
Amphetamines: NOT DETECTED
Barbiturates: NOT DETECTED
Benzodiazepines: POSITIVE — AB
Cocaine: POSITIVE — AB
Opiates: NOT DETECTED

## 2013-04-05 LAB — CBC WITH DIFFERENTIAL/PLATELET
Basophils Absolute: 0 10*3/uL (ref 0.0–0.1)
Basophils Relative: 0 % (ref 0–1)
Eosinophils Absolute: 0 10*3/uL (ref 0.0–0.7)
Eosinophils Relative: 0 % (ref 0–5)
HCT: 41.7 % (ref 36.0–46.0)
Lymphocytes Relative: 32 % (ref 12–46)
MCH: 28.9 pg (ref 26.0–34.0)
MCHC: 34.3 g/dL (ref 30.0–36.0)
MCV: 84.4 fL (ref 78.0–100.0)
Monocytes Absolute: 0.4 10*3/uL (ref 0.1–1.0)
Neutro Abs: 4.4 10*3/uL (ref 1.7–7.7)
Platelets: 334 10*3/uL (ref 150–400)
RDW: 15.5 % (ref 11.5–15.5)
WBC: 7.1 10*3/uL (ref 4.0–10.5)

## 2013-04-05 LAB — COMPREHENSIVE METABOLIC PANEL
AST: 18 U/L (ref 0–37)
Albumin: 4.1 g/dL (ref 3.5–5.2)
CO2: 22 mEq/L (ref 19–32)
Calcium: 10.1 mg/dL (ref 8.4–10.5)
Creatinine, Ser: 1.57 mg/dL — ABNORMAL HIGH (ref 0.50–1.10)
GFR calc Af Amer: 42 mL/min — ABNORMAL LOW (ref >90)
GFR calc non Af Amer: 36 mL/min — ABNORMAL LOW (ref >90)
Potassium: 4 mEq/L (ref 3.5–5.1)
Total Protein: 8 g/dL (ref 6.0–8.3)

## 2013-04-05 LAB — GLUCOSE, CAPILLARY
Glucose-Capillary: 415 mg/dL — ABNORMAL HIGH (ref 70–99)
Glucose-Capillary: 331 mg/dL — ABNORMAL HIGH (ref 70–99)

## 2013-04-05 MED ORDER — INSULIN ASPART 100 UNIT/ML ~~LOC~~ SOLN
4.0000 [IU] | Freq: Three times a day (TID) | SUBCUTANEOUS | Status: DC
  Administered 2013-04-05: 4 [IU] via SUBCUTANEOUS
  Filled 2013-04-05: qty 1

## 2013-04-05 MED ORDER — PANTOPRAZOLE SODIUM 40 MG PO TBEC
40.0000 mg | DELAYED_RELEASE_TABLET | Freq: Every day | ORAL | Status: DC
  Administered 2013-04-05 – 2013-04-06 (×2): 40 mg via ORAL
  Filled 2013-04-05 (×2): qty 1

## 2013-04-05 MED ORDER — LOPERAMIDE HCL 2 MG PO CAPS: 2.0000 mg | ORAL_CAPSULE | ORAL | Status: DC | PRN

## 2013-04-05 MED ORDER — ONDANSETRON 4 MG PO TBDP: 4.0000 mg | ORAL_TABLET | Freq: Four times a day (QID) | ORAL | Status: DC | PRN

## 2013-04-05 MED ORDER — LORAZEPAM 1 MG PO TABS: 1.0000 mg | ORAL_TABLET | Freq: Three times a day (TID) | ORAL | Status: DC | PRN

## 2013-04-05 MED ORDER — LATANOPROST 0.005 % OP SOLN
1.0000 [drp] | Freq: Every day | OPHTHALMIC | Status: DC
  Administered 2013-04-05: 1 [drp] via OPHTHALMIC
  Filled 2013-04-05: qty 2.5

## 2013-04-05 MED ORDER — CLONIDINE HCL 0.1 MG PO TABS: 0.1000 mg | ORAL_TABLET | Freq: Every day | ORAL | Status: DC

## 2013-04-05 MED ORDER — INSULIN ASPART 100 UNIT/ML ~~LOC~~ SOLN
0.0000 [IU] | Freq: Three times a day (TID) | SUBCUTANEOUS | Status: DC
  Administered 2013-04-05: 11 [IU] via SUBCUTANEOUS
  Administered 2013-04-06: 8 [IU] via SUBCUTANEOUS
  Administered 2013-04-06: 11 [IU] via SUBCUTANEOUS
  Filled 2013-04-05 (×2): qty 1

## 2013-04-05 MED ORDER — ONDANSETRON HCL 4 MG PO TABS: 4.0000 mg | ORAL_TABLET | Freq: Three times a day (TID) | ORAL | Status: DC | PRN

## 2013-04-05 MED ORDER — ATORVASTATIN CALCIUM 80 MG PO TABS
80.0000 mg | ORAL_TABLET | Freq: Every day | ORAL | Status: DC
Start: 2013-04-05 — End: 2013-04-06
  Administered 2013-04-05: 80 mg via ORAL
  Filled 2013-04-05 (×2): qty 1

## 2013-04-05 MED ORDER — ASPIRIN EC 81 MG PO TBEC
81.0000 mg | DELAYED_RELEASE_TABLET | Freq: Every day | ORAL | Status: DC
Start: 2013-04-05 — End: 2013-04-06
  Administered 2013-04-05 – 2013-04-06 (×2): 81 mg via ORAL
  Filled 2013-04-05 (×2): qty 1

## 2013-04-05 MED ORDER — INSULIN GLARGINE 100 UNIT/ML ~~LOC~~ SOLN
20.0000 [IU] | Freq: Every day | SUBCUTANEOUS | Status: DC
  Administered 2013-04-05: 20 [IU] via SUBCUTANEOUS
  Filled 2013-04-05 (×2): qty 0.2

## 2013-04-05 MED ORDER — MOMETASONE FURO-FORMOTEROL FUM 100-5 MCG/ACT IN AERO
2.0000 | INHALATION_SPRAY | Freq: Two times a day (BID) | RESPIRATORY_TRACT | Status: DC
  Administered 2013-04-05 – 2013-04-06 (×2): 2 via RESPIRATORY_TRACT
  Filled 2013-04-05: qty 8.8

## 2013-04-05 MED ORDER — ALBUTEROL SULFATE HFA 108 (90 BASE) MCG/ACT IN AERS
2.0000 | INHALATION_SPRAY | Freq: Four times a day (QID) | RESPIRATORY_TRACT | Status: DC | PRN
Start: 2013-04-05 — End: 2013-04-06

## 2013-04-05 MED ORDER — DICYCLOMINE HCL 20 MG PO TABS: 20.0000 mg | ORAL_TABLET | Freq: Four times a day (QID) | ORAL | Status: DC | PRN

## 2013-04-05 MED ORDER — SERTRALINE HCL 50 MG PO TABS
100.0000 mg | ORAL_TABLET | Freq: Every day | ORAL | Status: DC
Start: 2013-04-05 — End: 2013-04-06
  Administered 2013-04-05 – 2013-04-06 (×2): 100 mg via ORAL
  Filled 2013-04-05 (×2): qty 2

## 2013-04-05 MED ORDER — OLANZAPINE 10 MG PO TABS
10.0000 mg | ORAL_TABLET | Freq: Every day | ORAL | Status: DC
  Administered 2013-04-05: 10 mg via ORAL
  Filled 2013-04-05: qty 1

## 2013-04-05 MED ORDER — BRIMONIDINE TARTRATE 0.15 % OP SOLN
1.0000 [drp] | Freq: Two times a day (BID) | OPHTHALMIC | Status: DC
  Administered 2013-04-05 – 2013-04-06 (×2): 1 [drp] via OPHTHALMIC
  Filled 2013-04-05: qty 5

## 2013-04-05 MED ORDER — ALUM & MAG HYDROXIDE-SIMETH 200-200-20 MG/5ML PO SUSP: 30.0000 mL | ORAL | Status: DC | PRN

## 2013-04-05 MED ORDER — NAPROXEN 500 MG PO TABS: 500.0000 mg | ORAL_TABLET | Freq: Two times a day (BID) | ORAL | Status: DC | PRN

## 2013-04-05 MED ORDER — METHOCARBAMOL 500 MG PO TABS: 500.0000 mg | ORAL_TABLET | Freq: Three times a day (TID) | ORAL | Status: DC | PRN

## 2013-04-05 MED ORDER — LISINOPRIL 40 MG PO TABS
40.0000 mg | ORAL_TABLET | Freq: Every day | ORAL | Status: DC
  Administered 2013-04-06: 40 mg via ORAL
  Filled 2013-04-05: qty 1

## 2013-04-05 MED ORDER — INSULIN GLARGINE 100 UNIT/ML ~~LOC~~ SOLN: 25.0000 [IU] | Freq: Every day | SUBCUTANEOUS | Status: DC

## 2013-04-05 MED ORDER — BRIMONIDINE TARTRATE 0.2 % OP SOLN
1.0000 [drp] | Freq: Two times a day (BID) | OPHTHALMIC | Status: DC
  Filled 2013-04-05: qty 5

## 2013-04-05 MED ORDER — IBUPROFEN 200 MG PO TABS
600.0000 mg | ORAL_TABLET | Freq: Three times a day (TID) | ORAL | Status: DC | PRN
  Administered 2013-04-05 (×2): 600 mg via ORAL
  Filled 2013-04-05 (×2): qty 3

## 2013-04-05 MED ORDER — LEVOTHYROXINE SODIUM 125 MCG PO TABS
250.0000 ug | ORAL_TABLET | Freq: Every day | ORAL | Status: DC
  Administered 2013-04-06: 250 ug via ORAL
  Filled 2013-04-05 (×2): qty 2

## 2013-04-05 MED ORDER — ALLOPURINOL 300 MG PO TABS: 300.0000 mg | ORAL_TABLET | Freq: Every day | ORAL | Status: DC

## 2013-04-05 MED ORDER — CYCLOBENZAPRINE HCL 10 MG PO TABS
10.0000 mg | ORAL_TABLET | Freq: Once | ORAL | Status: AC
  Administered 2013-04-05: 10 mg via ORAL
  Filled 2013-04-05: qty 1

## 2013-04-05 MED ORDER — HYDROXYZINE HCL 25 MG PO TABS: 25.0000 mg | ORAL_TABLET | Freq: Four times a day (QID) | ORAL | Status: DC | PRN

## 2013-04-05 MED ORDER — TRAZODONE HCL 100 MG PO TABS
100.0000 mg | ORAL_TABLET | Freq: Every day | ORAL | Status: DC
Start: 2013-04-05 — End: 2013-04-06
  Administered 2013-04-05: 100 mg via ORAL
  Filled 2013-04-05: qty 1

## 2013-04-05 MED ORDER — CLONIDINE HCL 0.1 MG PO TABS: 0.1000 mg | ORAL_TABLET | ORAL | Status: DC

## 2013-04-05 MED ORDER — DIVALPROEX SODIUM ER 500 MG PO TB24
1000.0000 mg | ORAL_TABLET | Freq: Every day | ORAL | Status: DC
Start: 2013-04-05 — End: 2013-04-06
  Administered 2013-04-05: 1000 mg via ORAL
  Filled 2013-04-05 (×2): qty 2

## 2013-04-05 MED ORDER — ZOLPIDEM TARTRATE 5 MG PO TABS: 5.0000 mg | ORAL_TABLET | Freq: Every evening | ORAL | Status: DC | PRN

## 2013-04-05 MED ORDER — AMLODIPINE BESYLATE 5 MG PO TABS
5.0000 mg | ORAL_TABLET | Freq: Every day | ORAL | Status: DC
  Administered 2013-04-06: 5 mg via ORAL
  Filled 2013-04-05: qty 1

## 2013-04-05 MED ORDER — DIAZEPAM 5 MG PO TABS: 5.0000 mg | ORAL_TABLET | Freq: Two times a day (BID) | ORAL | Status: DC | PRN

## 2013-04-05 MED ORDER — CLONIDINE HCL 0.1 MG PO TABS
0.1000 mg | ORAL_TABLET | Freq: Four times a day (QID) | ORAL | Status: DC
  Administered 2013-04-05 (×3): 0.1 mg via ORAL
  Filled 2013-04-05 (×3): qty 1

## 2013-04-05 MED ORDER — ACETAMINOPHEN 325 MG PO TABS
650.0000 mg | ORAL_TABLET | ORAL | Status: DC | PRN
  Administered 2013-04-05: 650 mg via ORAL
  Filled 2013-04-05: qty 2

## 2013-04-05 NOTE — ED Notes (Signed)
Apologized to patient for delay in lunch tray. Advised patient we have called to check the status and nutrition is working on our lunch trays. Patient confirms understanding. Patient resting quietly with eyes closed, easily aroused.

## 2013-04-05 NOTE — Consult Note (Signed)
[facility] Face-to-Face Psychiatry Consult   Reason for Consult:  Depression and suicidal ideation  Referring Physician:  EDP  [name] is an 57 y.o. female.  Assessment: AXIS I:  Depressive Disorder NOS, Mood Disorder NOS and Substance Abuse AXIS II:  Deferred AXIS III:   Past Medical History  Diagnosis Date  . Asthma     past 10 years  . Diabetes mellitus     past 5 years  . Glaucoma     dx 06/2011-[facility]  . Hypertension   . Hyperlipidemia   . Hyperthyroidism   . History of TIA (transient ischemic attack)     01/06/2011, x2  . Pancreatitis   . Gout   . Depression   . Renal disorder     chronic kidney disease, stage 3  . Chronic pain   . Glaucoma   . Arthritis     djd -lower back, left knee  . Degenerative disc disease   . Bipolar disorder   . Gastritis   . COPD (chronic obstructive pulmonary disease)   . Schizophrenia   . 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 Colon polyps     colonoscopy 01/2013   . Fatty liver     mild CT 06/2012   AXIS IV:  other psychosocial or environmental problems and problems related to social environment AXIS V:  11-20 some danger of hurting self or others possible OR occasionally fails to maintain minimal personal hygiene OR gross impairment in communication  Plan:  Recommend psychiatric Inpatient admission when medically cleared.  Subjective:   [name] is a 57 y.o. female patient admitted with Major Depressive Disorder.  HPI:  Patient states that she is depressed.  "I'm so depressed; I just want to die."  Patient would not elaborate on the reason or the trigger for depression.  Patient states that she has a long history of depression and previous suicide attempt by overdose "a long time ago."  Patient states that she does have outpatient services but patient is going in and out of sleep during interview.  Patient states that she does not drink alcohol but does use crack cocaine and last use was 3 days ago.  Patient denies a history of rehab in past.  Patient states that hospitalization was related to depression.    HPI Elements:   Location:  [facility] ED. Quality:  Affecting patient mentally and physically. Severity:  Patient stating "I just want to die".  Past Psychiatric History: Past Medical History  Diagnosis Date  . Asthma     past 10 years  . Diabetes mellitus     past 5 years  . Glaucoma     dx 06/2011-[facility]  . Hypertension   . Hyperlipidemia   . Hyperthyroidism   . History of TIA (transient ischemic attack)     01/06/2011, x2  . Pancreatitis   . Gout   . Depression   . Renal disorder     chronic kidney disease, stage 3  . Chronic pain   . Glaucoma   . Arthritis     djd -lower back, left knee  . Degenerative disc disease   . Bipolar disorder   . Gastritis   . COPD (chronic obstructive pulmonary disease)   . Schizophrenia   . 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 Colon polyps     colonoscopy 01/2013   . Fatty liver     mild CT 06/2012  reports that she has been smoking Cigarettes.  She has a .3 pack-year smoking history. She has never used smokeless tobacco. She reports that she uses illicit drugs (Cocaine). She reports that she does not drink alcohol. Family History  Problem Relation Age of Onset  . Arthritis Mother   . Diabetes Mother   . Hypertension Mother   . Kidney disease Mother   . Heart disease Mother     died 65  . Heart disease      mother & father  . Hypertension      mother & father  . Colon cancer Neg Hx   . Prostate cancer Neg Hx   . Breast cancer Neg Hx   . Stomach cancer Neg Hx   . Hypertension Sister   . Hypertension Brother   . Kidney disease Brother            Allergies:   Allergies  Allergen Reactions  . Hydrocodone-Acetaminophen Hives  . Metformin Diarrhea    ACT Assessment Complete:  No:   Past Psychiatric History: Diagnosis:  Major Depressive Disorder, recurrent and substance abuse   Hospitalizations:  Yes  Outpatient Care:  Yes  Substance Abuse Care:  Denies any care. Use of Crack cocaine  Self-Mutilation:  Denies  Suicidal Attempts:  Yes  Homicidal Behaviors:  Denies   Violent Behaviors:  Denies   Place of Residence:  [place] Mar[place] Status:   Employed/Unemployed:  Unemployed Education:   Family Supports:  Yes Objective: Blood pressure 121/74, pulse 80, temperature 97.5 F (36.4 C), temperature source Oral, resp. rate 17, SpO2 97.00%.There is no weight on file to calculate BMI. Results for orders placed during the hospital encounter of 04/05/13 (from the past 72 hour(s))  GLUCOSE, CAPILLARY     Status: Abnormal   Collection Time    04/05/13  9:52 AM      Result Value Range   Glucose-Capillary 354 (*) 70 - 99 mg/dL  CBC WITH DIFFERENTIAL     Status: None   Collection Time    04/05/13 11:30 AM      Result Value Range   WBC 7.1  4.0 - 10.5 K/uL   RBC 4.94  3.87 - 5.11 MIL/uL   Hemoglobin 14.3  12.0 - 15.0 g/dL   HCT 09.8  11.9 - 14.7 %   MCV 84.4  78.0 - 100.0 fL   MCH 28.9  26.0 - 34.0 pg   MCHC 34.3  30.0 - 36.0 g/dL   RDW 82.9  56.2 - 13.0 %   Platelets 334  150 - 400 K/uL   Neutrophils Relative % 62  43 - 77 %   Neutro Abs 4.4  1.7 - 7.7 K/uL   Lymphocytes Relative 32  12 - 46 %   Lymphs Abs 2.3  0.7 - 4.0 K/uL   Monocytes Relative 6  3 - 12 %   Monocytes Absolute 0.4  0.1 - 1.0 K/uL   Eosinophils Relative 0  0 - 5 %   Eosinophils Absolute 0.0  0.0 - 0.7 K/uL   Basophils Relative 0  0 - 1 %   Basophils Absolute 0.0  0.0 - 0.1 K/uL  COMPREHENSIVE METABOLIC PANEL     Status: Abnormal   Collection Time    04/05/13 11:30 AM      Result Value Range   Sodium 133 (*) 135 - 145 mEq/L   Potassium 4.0  3.5 - 5.1 mEq/L   Chloride 96  96 - 112 mEq/L  CO2 22  19 - 32 mEq/L   Glucose, Bld 417 (*) 70 - 99 mg/dL   BUN 23  6 - 23 mg/dL   Creatinine, Ser 4.54 (*) 0.50 - 1.10 mg/dL   Calcium 09.8  8.4 - 11.9 mg/dL   Total Protein 8.0  6.0 - 8.3 g/dL    Albumin 4.1  3.5 - 5.2 g/dL   AST 18  0 - 37 U/L   ALT 13  0 - 35 U/L   Alkaline Phosphatase 141 (*) 39 - 117 U/L   Total Bilirubin 0.3  0.3 - 1.2 mg/dL   GFR calc non Af Amer 36 (*) >90 mL/min   GFR calc Af Amer 42 (*) >90 mL/min   Comment: (NOTE)     The eGFR has been calculated using the CKD EPI equation.     This calculation has not been validated in all clinical situations.     eGFR's persistently <90 mL/min signify possible Chronic Kidney     Disease.  ETHANOL     Status: None   Collection Time    04/05/13 11:30 AM      Result Value Range   Alcohol, Ethyl (B) <11  0 - 11 mg/dL   Comment:            LOWEST DETECTABLE LIMIT FOR     SERUM ALCOHOL IS 11 mg/dL     FOR MEDICAL PURPOSES ONLY  GLUCOSE, CAPILLARY     Status: Abnormal   Collection Time    04/05/13  2:27 PM      Result Value Range   Glucose-Capillary 415 (*) 70 - 99 mg/dL    Current Facility-Administered Medications  Medication Dose Route Frequency Provider Last Rate Last Dose  . acetaminophen (TYLENOL) tablet 650 mg  650 mg Oral Q4H PRN Jamesetta Orleans Lawyer, PA-C   650 mg at 04/05/13 1227  . albuterol (PROVENTIL HFA;VENTOLIN HFA) 108 (90 BASE) MCG/ACT inhaler 2 puff  2 puff Inhalation Q6H PRN Jamesetta Orleans Lawyer, PA-C      . alum & mag hydroxide-simeth (MAALOX/MYLANTA) 200-200-20 MG/5ML suspension 30 mL  30 mL Oral PRN Jamesetta Orleans Lawyer, PA-C      . Melene Muller ON 04/06/2013] amLODipine (NORVASC) tablet 5 mg  5 mg Oral Daily Christopher W Lawyer, PA-C      . aspirin EC tablet 81 mg  81 mg Oral Daily Jamesetta Orleans Lawyer, PA-C   81 mg at 04/05/13 1512  . atorvastatin (LIPITOR) tablet 80 mg  80 mg Oral q1800 Jamesetta Orleans Lawyer, PA-C      . brimonidine (ALPHAGAN) 0.15 % ophthalmic solution 1 drop  1 drop Both Eyes BID Lyanne Co, MD      . cloNIDine (CATAPRES) tablet 0.1 mg  0.1 mg Oral QID Lyanne Co, MD   0.1 mg at 04/05/13 1512   Followed by  . [START ON 04/07/2013] cloNIDine (CATAPRES) tablet 0.1 mg   0.1 mg Oral BH-qamhs Lyanne Co, MD       Followed by  . [START ON 04/10/2013] cloNIDine (CATAPRES) tablet 0.1 mg  0.1 mg Oral QAC breakfast Lyanne Co, MD      . dicyclomine (BENTYL) tablet 20 mg  20 mg Oral Q6H PRN Lyanne Co, MD      . divalproex (DEPAKOTE ER) 24 hr tablet 1,000 mg  1,000 mg Oral QHS Jamesetta Orleans Lawyer, PA-C      . hydrOXYzine (ATARAX/VISTARIL) tablet 25 mg  25 mg Oral Q6H PRN  Lyanne Co, MD      . ibuprofen (ADVIL,MOTRIN) tablet 600 mg  600 mg Oral Q8H PRN Jamesetta Orleans Lawyer, PA-C   600 mg at 04/05/13 1227  . insulin aspart (novoLOG) injection 0-15 Units  0-15 Units Subcutaneous TID WC Lyanne Co, MD      . insulin glargine (LANTUS) injection 20 Units  20 Units Subcutaneous QHS Christopher W Lawyer, PA-C      . latanoprost (XALATAN) 0.005 % ophthalmic solution 1 drop  1 drop Both Eyes QHS Jamesetta Orleans Lawyer, PA-C      . Melene Muller ON 04/06/2013] levothyroxine (SYNTHROID, LEVOTHROID) tablet 250 mcg  250 mcg Oral QAC breakfast Jamesetta Orleans Lawyer, PA-C      . Melene Muller ON 04/06/2013] lisinopril (PRINIVIL,ZESTRIL) tablet 40 mg  40 mg Oral Daily Christopher W Lawyer, PA-C      . loperamide (IMODIUM) capsule 2-4 mg  2-4 mg Oral PRN Lyanne Co, MD      . LORazepam (ATIVAN) tablet 1 mg  1 mg Oral Q8H PRN Jamesetta Orleans Lawyer, PA-C      . methocarbamol (ROBAXIN) tablet 500 mg  500 mg Oral Q8H PRN Lyanne Co, MD      . mometasone-formoterol [facility]) 100-5 MCG/ACT inhaler 2 puff  2 puff Inhalation BID Jamesetta Orleans Lawyer, PA-C   2 puff at 04/05/13 1513  . naproxen (NAPROSYN) tablet 500 mg  500 mg Oral BID PRN Lyanne Co, MD      . OLANZapine [facility]) tablet 10 mg  10 mg Oral QHS Lyanne Co, MD      . ondansetron [facility]) tablet 4 mg  4 mg Oral Q8H PRN Jamesetta Orleans Lawyer, PA-C      . ondansetron (ZOFRAN-ODT) disintegrating tablet 4 mg  4 mg Oral Q6H PRN Lyanne Co, MD      . pantoprazole (PROTONIX) EC tablet 40 mg  40 mg Oral Daily Jamesetta Orleans Lawyer, PA-C   40 mg at 04/05/13 1340  . sertraline (ZOLOFT) tablet 100 mg  100 mg Oral Daily Jamesetta Orleans Lawyer, PA-C   100 mg at 04/05/13 1340  . traZODone (DESYREL) tablet 100 mg  100 mg Oral QHS Jamesetta Orleans Lawyer, PA-C      . zolpidem (AMBIEN) tablet 5 mg  5 mg Oral QHS PRN Carlyle Dolly, PA-C       Current Outpatient Prescriptions  Medication Sig Dispense Refill  . albuterol (PROVENTIL HFA;VENTOLIN HFA) 108 (90 BASE) MCG/ACT inhaler Inhale 2 puffs into the lungs every 6 (six) hours as needed for wheezing.  1 Inhaler  2  . allopurinol (ZYLOPRIM) 300 MG tablet Take 300 mg by mouth daily.      Marland Kitchen amLODipine (NORVASC) 5 MG tablet Take 1 tablet (5 mg total) by mouth daily.  30 tablet  0  . aspirin EC 81 MG tablet Take 1 tablet (81 mg total) by mouth daily.  30 tablet  11  . atorvastatin (LIPITOR) 80 MG tablet Take 1 tablet (80 mg total) by mouth daily at 6 PM.  30 tablet  1  . brimonidine (ALPHAGAN) 0.2 % ophthalmic solution Place 1 drop into both eyes 2 (two) times daily.      . colchicine 0.6 MG tablet Take 0.6 mg by mouth daily as needed (for gout flare-ups).       . diazepam (VALIUM) 5 MG tablet Take 5 mg by mouth every 12 (twelve) hours as needed for anxiety.      Marland Kitchen  divalproex (DEPAKOTE ER) 500 MG 24 hr tablet Take 1,000 mg by mouth at bedtime.      . Fluticasone-Salmeterol (ADVAIR DISKUS) 250-50 MCG/DOSE AEPB Inhale 1 puff into the lungs 2 times daily at 12 noon and 4 pm.  60 each  0  . insulin aspart (NOVOLOG) 100 UNIT/ML injection Inject 4 Units into the skin 3 (three) times daily with meals.  1 vial  12  . insulin glargine (LANTUS) 100 UNIT/ML injection Inject 20 Units into the skin at bedtime.      Marland Kitchen latanoprost (XALATAN) 0.005 % ophthalmic solution Place 1 drop into both eyes at bedtime.  2.5 mL  0  . levothyroxine (SYNTHROID, LEVOTHROID) 125 MCG tablet Take 250 mcg by mouth daily before breakfast.      . lisinopril (PRINIVIL,ZESTRIL) 40 MG tablet Take 40 mg by mouth  daily.      . nicotine (NICODERM CQ - DOSED IN MG/24 HOURS) 14 mg/24hr patch Place 1 patch onto the skin daily.       . nitroGLYCERIN (NITROSTAT) 0.4 MG SL tablet Place 1 tablet (0.4 mg total) under the tongue every 5 (five) minutes as needed for chest pain.  30 tablet  3  . OLANZapine (ZYPREXA) 10 MG tablet Take 10 mg by mouth at bedtime.      . Oxycodone HCl 10 MG TABS Take 10 mg by mouth every 12 (twelve) hours.      . pantoprazole (PROTONIX) 40 MG tablet Take 1 tablet (40 mg total) by mouth daily.  30 tablet  11  . sertraline (ZOLOFT) 25 MG tablet Take 100 mg by mouth daily.       . traZODone (DESYREL) 100 MG tablet Take 100 mg by mouth at bedtime.        Psychiatric Specialty Exam:     Blood pressure 121/74, pulse 80, temperature 97.5 F (36.4 C), temperature source Oral, resp. rate 17, SpO2 97.00%.There is no weight on file to calculate BMI.  General Appearance: Casual  Eye Contact::  Poor  Speech:  Clear and Coherent and Slow  Volume:  Decreased  Mood:  Depressed  Affect:  Depressed and Flat  Thought Process:  Circumstantial  Orientation:  Full (Time, Place, and Person)  Thought Content:  "I'm just tired"  Suicidal Thoughts:  Yes.  with intent/plan  Homicidal Thoughts:  No  Memory:  Immediate;   Fair Recent;   Fair  Judgement:  Impaired  Insight:  Lacking  Psychomotor Activity:  Decreased  Concentration:  Poor  Recall:  Fair  Akathisia:  No  Handed:  Right  AIMS (if indicated):     Assets:  Desire for Improvement  Sleep:      Face to face interview and consulted Dr. Gilmore Laroche  Treatment Plan Summary: Daily contact with patient to assess and evaluate symptoms and progress in treatment Medication management  Disposition:  Inpatient treatment recommended.  Start home psychotropic medications; Monitor for safety and stabilization until inpatient bed is found.  Assunta Found, FNP-BC 04/05/2013 4:45 PM

## 2013-04-05 NOTE — ED Notes (Signed)
Patient requesting information r/t speaking to a psychiatrist and being transferred to [facility]. Patient advised the process may be different than what she has gone through prior. Explained patient will speak to a counselor first, and then possible speak to a psychiatrist, explained that this is typically done through a video phone system and would take place in her room. I explained to patient that based on the assessment by counselor and psychiatrist they would determine her behavioral health needs and make the appropriate interventions.

## 2013-04-05 NOTE — Progress Notes (Signed)
Underwriter initiated inpatient treatment placement on behalf of pt.  The following hospitals were faxed referrals based on bed availability: 1)[facility] 2)[facility] 4)[facility] 5)[facility] 6)[facility] 8)[facility]  Blain Pais, MHT/NS

## 2013-04-05 NOTE — ED Notes (Signed)
Patient states she uses 4 units Novolog at meals without regard to her glucose values, patient also states she uses 20 units of Lantus at night time.

## 2013-04-05 NOTE — ED Provider Notes (Signed)
CSN: [id number]     Arrival date & time 04/05/13  0941 History   First MD Initiated Contact with Patient 04/05/13 1035     Chief Complaint  Patient presents with  . Suicidal   (Consider location/radiation/quality/duration/timing/severity/associated sxs/prior Treatment) HPI Patient presents to the emergency department for suicidal ideation.  The patient, states she's tired of living and doesn't want to deal with her problems and issues any longer.  Patient, states she would like to cut her wrist as a plan of suicide.  Patient denies hallucinations, nausea, vomiting, headache, blurred vision, chest pain, shortness of breath, abdominal pain, weakness, dizziness, or numbness.  Patient, states she's been suicidal in the past and attempted this with an overdose of pills  o Past Medical History  Diagnosis Date  . Asthma     past 10 years  . Diabetes mellitus     past 5 years  . Glaucoma     dx 06/2011-[facility]  . Hypertension   . Hyperlipidemia   . Hyperthyroidism   . History of TIA (transient ischemic attack)     01/06/2011, x2  . Pancreatitis   . Gout   . Depression   . Renal disorder     chronic kidney disease, stage 3  . Chronic pain   . Glaucoma   . Arthritis     djd -lower back, left knee  . Degenerative disc disease   . Bipolar disorder   . Gastritis   . COPD (chronic obstructive pulmonary disease)   . Schizophrenia   . 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 Colon polyps     colonoscopy 01/2013   . Fatty liver     mild CT 06/2012   Past Surgical History  Procedure Laterality Date  . Vaginal hysterectomy  1990  . Abdominal hysterectomy      still has ovaries    Family History  Problem Relation Age of Onset  . Arthritis Mother   . Diabetes Mother   . Hypertension Mother   . Kidney disease Mother   . Heart disease Mother     died 52  . Heart disease      mother & father  . Hypertension      mother & father  . Colon cancer Neg Hx   .  Prostate cancer Neg Hx   . Breast cancer Neg Hx   . Stomach cancer Neg Hx   . Hypertension Sister   . Hypertension Brother   . Kidney disease Brother    History  Substance Use Topics  . Smoking status: Current Every Day Smoker -- 0.01 packs/day for 30 years    Types: Cigarettes  . Smokeless tobacco: Never Used     Comment: smokes 2 cigs daily/uses patches.  . Alcohol Use: No   OB History   Grav Para Term Preterm Abortions TAB SAB Ect Mult Living                 Review of Systems All other systems negative except as documented in the HPI. All pertinent positives and negatives as reviewed in the HPI. Allergies  Hydrocodone-acetaminophen and Metformin  Home Medications   Current Outpatient Rx  Name  Route  Sig  Dispense  Refill  . albuterol (PROVENTIL HFA;VENTOLIN HFA) 108 (90 BASE) MCG/ACT inhaler   Inhalation   Inhale 2 puffs into the lungs every 6 (six) hours as needed for wheezing.   1 Inhaler   2   . allopurinol (ZYLOPRIM) 300  MG tablet   Oral   Take 300 mg by mouth daily.         Marland Kitchen amLODipine (NORVASC) 5 MG tablet   Oral   Take 1 tablet (5 mg total) by mouth daily.   30 tablet   0   . aspirin EC 81 MG tablet   Oral   Take 1 tablet (81 mg total) by mouth daily.   30 tablet   11   . atorvastatin (LIPITOR) 80 MG tablet   Oral   Take 1 tablet (80 mg total) by mouth daily at 6 PM.   30 tablet   1   . brimonidine (ALPHAGAN) 0.2 % ophthalmic solution   Both Eyes   Place 1 drop into both eyes 2 (two) times daily.         . colchicine 0.6 MG tablet   Oral   Take 0.6 mg by mouth daily as needed (for gout flare-ups).          . diazepam (VALIUM) 5 MG tablet   Oral   Take 5 mg by mouth every 12 (twelve) hours as needed for anxiety.         . divalproex (DEPAKOTE ER) 500 MG 24 hr tablet   Oral   Take 1,000 mg by mouth at bedtime.         . Fluticasone-Salmeterol (ADVAIR DISKUS) 250-50 MCG/DOSE AEPB   Inhalation   Inhale 1 puff into the lungs  2 times daily at 12 noon and 4 pm.   60 each   0   . insulin aspart (NOVOLOG) 100 UNIT/ML injection   Subcutaneous   Inject 4 Units into the skin 3 (three) times daily with meals.   1 vial   12   . insulin glargine (LANTUS) 100 UNIT/ML injection   Subcutaneous   Inject 20 Units into the skin at bedtime.         Marland Kitchen latanoprost (XALATAN) 0.005 % ophthalmic solution   Both Eyes   Place 1 drop into both eyes at bedtime.   2.5 mL   0   . levothyroxine (SYNTHROID, LEVOTHROID) 125 MCG tablet   Oral   Take 250 mcg by mouth daily before breakfast.         . lisinopril (PRINIVIL,ZESTRIL) 40 MG tablet   Oral   Take 40 mg by mouth daily.         . nicotine (NICODERM CQ - DOSED IN MG/24 HOURS) 14 mg/24hr patch   Transdermal   Place 1 patch onto the skin daily.          . nitroGLYCERIN (NITROSTAT) 0.4 MG SL tablet   Sublingual   Place 1 tablet (0.4 mg total) under the tongue every 5 (five) minutes as needed for chest pain.   30 tablet   3   . OLANZapine (ZYPREXA) 10 MG tablet   Oral   Take 10 mg by mouth at bedtime.         . Oxycodone HCl 10 MG TABS   Oral   Take 10 mg by mouth every 12 (twelve) hours.         . pantoprazole (PROTONIX) 40 MG tablet   Oral   Take 1 tablet (40 mg total) by mouth daily.   30 tablet   11   . sertraline (ZOLOFT) 25 MG tablet   Oral   Take 100 mg by mouth daily.          . traZODone (DESYREL) 100 MG  tablet   Oral   Take 100 mg by mouth at bedtime.          BP 101/55  Pulse 106  Temp(Src) 98 F (36.7 C) (Oral)  Resp 20  SpO2 96% Physical Exam  Nursing note and vitals reviewed. Constitutional: She is oriented to person, place, and time. She appears well-developed and well-nourished. No distress.  HENT:  Head: Normocephalic and atraumatic.  Mouth/Throat: Oropharynx is clear and moist.  Eyes: Pupils are equal, round, and reactive to light.  Neck: Normal range of motion. Neck supple.  Cardiovascular: Normal rate,  regular rhythm and normal heart sounds.  Exam reveals no gallop and no friction rub.   No murmur heard. Pulmonary/Chest: Effort normal and breath sounds normal. No respiratory distress.  Neurological: She is alert and oriented to person, place, and time. She exhibits normal muscle tone. Coordination normal.    ED Course  Procedures (including critical care time) Labs Review Labs Reviewed  GLUCOSE, CAPILLARY - Abnormal; Notable for the following:    Glucose-Capillary 354 (*)    All other components within normal limits  COMPREHENSIVE METABOLIC PANEL - Abnormal; Notable for the following:    Sodium 133 (*)    Glucose, Bld 417 (*)    Creatinine, Ser 1.57 (*)    Alkaline Phosphatase 141 (*)    GFR calc non Af Amer 36 (*)    GFR calc Af Amer 42 (*)    All other components within normal limits  CBC WITH DIFFERENTIAL  ETHANOL  URINE RAPID DRUG SCREEN (HOSP PERFORMED)   Patient be placed psych ED for further evaluation of her suicidal ideation.   Carlyle Dolly, PA-C 04/05/13 1407

## 2013-04-05 NOTE — ED Notes (Signed)
Pt reports that she has too many things going on, and she is tired of living. Pt states," I stay sick, I'm always in pain and I can't take this anymore, I've been feeling like this for a week now. About 3 days ago, I smoked some cocaine. I don't want to feel this pain anymore." Pt states that she does have a plan to "cut my vein, lay down and I die because I'm tired." Pt is tearful in triage. Pt is A&O and in NAD

## 2013-04-05 NOTE — ED Provider Notes (Addendum)
Medical screening examination/treatment/procedure(s) were conducted as a shared visit with non-physician practitioner(s) and myself.  I personally evaluated the patient during the encounter.  EKG Interpretation   None       Well-appearing.  Medically clear.  Behavior health team to be involved.placed on insulin sliding scale with standard home medications.  Normal glycemic checks here in the ER.  The patient's chronic pain medications including her oxycodone will be held.  The patient's had multiple red flags regarding opiates.  She'll be placed on a clonidine opiate withdrawal protocol.  Lyanne Co, MD 04/05/13 1408  Lyanne Co, MD 04/05/13 1420

## 2013-04-05 NOTE — ED Notes (Signed)
Patient belongings in 3 bags, inventoried by [place], Charity fundraiser and [place], [place], secured in [employer].

## 2013-04-05 NOTE — Progress Notes (Addendum)
Per Lyn Hollingshead, IVC paperwork received, paperwork is good, and magistrate is sending off to [place] to serve.  [place] Spaniel [place], [phone]     ED CSW  5:06pm ________________________________________________  CSW faxed IVC paperwork to magistrate. Per Catha Nottingham, fax received. Per Linus Mako is the magistrate for this paperwork. CSW shift ending--CSW passing off paperwork to RN.  [place] Spaniel [place], [phone]     ED CSW

## 2013-04-06 ENCOUNTER — Encounter (HOSPITAL_COMMUNITY): Payer: Self-pay | Admitting: *Deleted

## 2013-04-06 ENCOUNTER — Inpatient Hospital Stay (HOSPITAL_COMMUNITY)
Admission: AD | Admit: 2013-04-06 | Discharge: 2013-04-08 | DRG: 885 | Disposition: A | Discharge status: Other Acute Inpatient Hospital | Payer: 59 | Source: Intra-hospital | Attending: Psychiatry | Admitting: Psychiatry

## 2013-04-06 DIAGNOSIS — M109 Gout, unspecified: Secondary | ICD-10-CM

## 2013-04-06 DIAGNOSIS — I10 Essential (primary) hypertension: Secondary | ICD-10-CM | POA: Diagnosis present

## 2013-04-06 DIAGNOSIS — H04123 Dry eye syndrome of bilateral lacrimal glands: Secondary | ICD-10-CM

## 2013-04-06 DIAGNOSIS — J45909 Unspecified asthma, uncomplicated: Secondary | ICD-10-CM | POA: Diagnosis present

## 2013-04-06 DIAGNOSIS — R079 Chest pain, unspecified: Secondary | ICD-10-CM

## 2013-04-06 DIAGNOSIS — K859 Acute pancreatitis without necrosis or infection, unspecified: Secondary | ICD-10-CM

## 2013-04-06 DIAGNOSIS — J441 Chronic obstructive pulmonary disease with (acute) exacerbation: Secondary | ICD-10-CM

## 2013-04-06 DIAGNOSIS — E119 Type 2 diabetes mellitus without complications: Secondary | ICD-10-CM | POA: Diagnosis present

## 2013-04-06 DIAGNOSIS — F1110 Opioid abuse, uncomplicated: Secondary | ICD-10-CM | POA: Diagnosis present

## 2013-04-06 DIAGNOSIS — Z79899 Other long term (current) drug therapy: Secondary | ICD-10-CM

## 2013-04-06 DIAGNOSIS — F332 Major depressive disorder, recurrent severe without psychotic features: Secondary | ICD-10-CM

## 2013-04-06 DIAGNOSIS — F1410 Cocaine abuse, uncomplicated: Secondary | ICD-10-CM | POA: Diagnosis present

## 2013-04-06 DIAGNOSIS — Z0000 Encounter for general adult medical examination without abnormal findings: Secondary | ICD-10-CM

## 2013-04-06 DIAGNOSIS — Z86010 Personal history of colonic polyps: Secondary | ICD-10-CM

## 2013-04-06 DIAGNOSIS — Z720 Tobacco use: Secondary | ICD-10-CM

## 2013-04-06 DIAGNOSIS — F411 Generalized anxiety disorder: Secondary | ICD-10-CM | POA: Diagnosis present

## 2013-04-06 DIAGNOSIS — F329 Major depressive disorder, single episode, unspecified: Secondary | ICD-10-CM | POA: Diagnosis present

## 2013-04-06 DIAGNOSIS — G8929 Other chronic pain: Secondary | ICD-10-CM

## 2013-04-06 DIAGNOSIS — IMO0002 Reserved for concepts with insufficient information to code with codable children: Secondary | ICD-10-CM

## 2013-04-06 DIAGNOSIS — G629 Polyneuropathy, unspecified: Secondary | ICD-10-CM

## 2013-04-06 DIAGNOSIS — E785 Hyperlipidemia, unspecified: Secondary | ICD-10-CM

## 2013-04-06 DIAGNOSIS — R739 Hyperglycemia, unspecified: Secondary | ICD-10-CM

## 2013-04-06 DIAGNOSIS — F418 Other specified anxiety disorders: Secondary | ICD-10-CM

## 2013-04-06 DIAGNOSIS — F209 Schizophrenia, unspecified: Secondary | ICD-10-CM

## 2013-04-06 DIAGNOSIS — E039 Hypothyroidism, unspecified: Secondary | ICD-10-CM

## 2013-04-06 LAB — GLUCOSE, CAPILLARY
Glucose-Capillary: 336 mg/dL — ABNORMAL HIGH (ref 70–99)
Glucose-Capillary: 349 mg/dL — ABNORMAL HIGH (ref 70–99)

## 2013-04-06 MED ORDER — INSULIN GLARGINE 100 UNIT/ML ~~LOC~~ SOLN
20.0000 [IU] | Freq: Every day | SUBCUTANEOUS | Status: DC
  Administered 2013-04-06: 20 [IU] via SUBCUTANEOUS

## 2013-04-06 MED ORDER — LATANOPROST 0.005 % OP SOLN
1.0000 [drp] | Freq: Every day | OPHTHALMIC | Status: DC
  Administered 2013-04-06 – 2013-04-07 (×2): 1 [drp] via OPHTHALMIC
  Filled 2013-04-06: qty 2.5

## 2013-04-06 MED ORDER — LISINOPRIL 40 MG PO TABS
40.0000 mg | ORAL_TABLET | Freq: Every day | ORAL | Status: DC
  Administered 2013-04-08: 40 mg via ORAL
  Filled 2013-04-06 (×4): qty 1

## 2013-04-06 MED ORDER — LEVOTHYROXINE SODIUM 125 MCG PO TABS
250.0000 ug | ORAL_TABLET | Freq: Every day | ORAL | Status: DC
  Administered 2013-04-07 – 2013-04-08 (×2): 250 ug via ORAL
  Filled 2013-04-06 (×5): qty 2

## 2013-04-06 MED ORDER — DIVALPROEX SODIUM ER 500 MG PO TB24
1000.0000 mg | ORAL_TABLET | Freq: Every day | ORAL | Status: DC
  Administered 2013-04-06 – 2013-04-07 (×2): 1000 mg via ORAL
  Filled 2013-04-06 (×5): qty 2

## 2013-04-06 MED ORDER — INSULIN ASPART 100 UNIT/ML ~~LOC~~ SOLN
0.0000 [IU] | Freq: Three times a day (TID) | SUBCUTANEOUS | Status: DC
  Administered 2013-04-06 – 2013-04-07 (×2): 11 [IU] via SUBCUTANEOUS
  Administered 2013-04-07 (×2): 15 [IU] via SUBCUTANEOUS

## 2013-04-06 MED ORDER — ACETAMINOPHEN 325 MG PO TABS: 650.0000 mg | ORAL_TABLET | Freq: Four times a day (QID) | ORAL | Status: DC | PRN

## 2013-04-06 MED ORDER — METHOCARBAMOL 500 MG PO TABS
500.0000 mg | ORAL_TABLET | Freq: Three times a day (TID) | ORAL | Status: DC | PRN
  Administered 2013-04-08: 500 mg via ORAL
  Filled 2013-04-06: qty 1

## 2013-04-06 MED ORDER — NAPROXEN 500 MG PO TABS
500.0000 mg | ORAL_TABLET | Freq: Two times a day (BID) | ORAL | Status: DC | PRN
  Administered 2013-04-07 – 2013-04-08 (×2): 500 mg via ORAL
  Filled 2013-04-06 (×3): qty 1

## 2013-04-06 MED ORDER — CLONIDINE HCL 0.1 MG PO TABS: 0.1000 mg | ORAL_TABLET | Freq: Every day | ORAL | Status: DC

## 2013-04-06 MED ORDER — ONDANSETRON HCL 4 MG PO TABS: 4.0000 mg | ORAL_TABLET | Freq: Three times a day (TID) | ORAL | Status: DC | PRN

## 2013-04-06 MED ORDER — OLANZAPINE 10 MG PO TABS
10.0000 mg | ORAL_TABLET | Freq: Every day | ORAL | Status: DC
  Administered 2013-04-06 – 2013-04-07 (×2): 10 mg via ORAL
  Filled 2013-04-06 (×5): qty 1

## 2013-04-06 MED ORDER — ONDANSETRON 4 MG PO TBDP: 4.0000 mg | ORAL_TABLET | Freq: Four times a day (QID) | ORAL | Status: DC | PRN

## 2013-04-06 MED ORDER — TRAZODONE HCL 100 MG PO TABS
100.0000 mg | ORAL_TABLET | Freq: Every day | ORAL | Status: DC
  Administered 2013-04-06 – 2013-04-07 (×2): 100 mg via ORAL
  Filled 2013-04-06 (×4): qty 1

## 2013-04-06 MED ORDER — ALUM & MAG HYDROXIDE-SIMETH 200-200-20 MG/5ML PO SUSP: 30.0000 mL | ORAL | Status: DC | PRN

## 2013-04-06 MED ORDER — CLONIDINE HCL 0.1 MG PO TABS
0.1000 mg | ORAL_TABLET | ORAL | Status: DC
  Filled 2013-04-06 (×3): qty 1

## 2013-04-06 MED ORDER — ASPIRIN EC 81 MG PO TBEC
81.0000 mg | DELAYED_RELEASE_TABLET | Freq: Every day | ORAL | Status: DC
  Administered 2013-04-07 – 2013-04-08 (×2): 81 mg via ORAL
  Filled 2013-04-06 (×4): qty 1

## 2013-04-06 MED ORDER — MAGNESIUM HYDROXIDE 400 MG/5ML PO SUSP: 30.0000 mL | Freq: Every day | ORAL | Status: DC | PRN

## 2013-04-06 MED ORDER — MOMETASONE FURO-FORMOTEROL FUM 100-5 MCG/ACT IN AERO
2.0000 | INHALATION_SPRAY | Freq: Two times a day (BID) | RESPIRATORY_TRACT | Status: DC
  Administered 2013-04-06 – 2013-04-08 (×4): 2 via RESPIRATORY_TRACT
  Filled 2013-04-06 (×2): qty 8.8

## 2013-04-06 MED ORDER — SERTRALINE HCL 100 MG PO TABS
100.0000 mg | ORAL_TABLET | Freq: Every day | ORAL | Status: DC
Start: 2013-04-07 — End: 2013-04-07
  Administered 2013-04-07: 100 mg via ORAL
  Filled 2013-04-06 (×3): qty 1

## 2013-04-06 MED ORDER — AMLODIPINE BESYLATE 5 MG PO TABS
5.0000 mg | ORAL_TABLET | Freq: Every day | ORAL | Status: DC
  Administered 2013-04-08: 5 mg via ORAL
  Filled 2013-04-06 (×4): qty 1

## 2013-04-06 MED ORDER — ALBUTEROL SULFATE HFA 108 (90 BASE) MCG/ACT IN AERS: 2.0000 | INHALATION_SPRAY | Freq: Four times a day (QID) | RESPIRATORY_TRACT | Status: DC | PRN

## 2013-04-06 MED ORDER — CLONIDINE HCL 0.1 MG PO TABS
0.1000 mg | ORAL_TABLET | Freq: Four times a day (QID) | ORAL | Status: DC
  Administered 2013-04-06 – 2013-04-08 (×8): 0.1 mg via ORAL
  Filled 2013-04-06 (×12): qty 1

## 2013-04-06 MED ORDER — ATORVASTATIN CALCIUM 80 MG PO TABS
80.0000 mg | ORAL_TABLET | Freq: Every day | ORAL | Status: DC
  Administered 2013-04-06 – 2013-04-07 (×2): 80 mg via ORAL
  Filled 2013-04-06 (×4): qty 1
  Filled 2013-04-06: qty 2

## 2013-04-06 MED ORDER — BRIMONIDINE TARTRATE 0.15 % OP SOLN
1.0000 [drp] | Freq: Two times a day (BID) | OPHTHALMIC | Status: DC
  Administered 2013-04-06 – 2013-04-08 (×4): 1 [drp] via OPHTHALMIC
  Filled 2013-04-06: qty 5

## 2013-04-06 MED ORDER — HYDROXYZINE HCL 25 MG PO TABS: 25.0000 mg | ORAL_TABLET | Freq: Four times a day (QID) | ORAL | Status: DC | PRN

## 2013-04-06 MED ORDER — LOPERAMIDE HCL 2 MG PO CAPS: 2.0000 mg | ORAL_CAPSULE | ORAL | Status: DC | PRN

## 2013-04-06 MED ORDER — LORAZEPAM 1 MG PO TABS
1.0000 mg | ORAL_TABLET | Freq: Three times a day (TID) | ORAL | Status: DC | PRN
  Administered 2013-04-06 – 2013-04-07 (×3): 1 mg via ORAL
  Filled 2013-04-06 (×3): qty 1

## 2013-04-06 MED ORDER — PANTOPRAZOLE SODIUM 40 MG PO TBEC
40.0000 mg | DELAYED_RELEASE_TABLET | Freq: Every day | ORAL | Status: DC
  Administered 2013-04-07 – 2013-04-08 (×2): 40 mg via ORAL
  Filled 2013-04-06 (×4): qty 1

## 2013-04-06 MED ORDER — DICYCLOMINE HCL 20 MG PO TABS: 20.0000 mg | ORAL_TABLET | Freq: Four times a day (QID) | ORAL | Status: DC | PRN

## 2013-04-06 MED ORDER — IBUPROFEN 600 MG PO TABS: 600.0000 mg | ORAL_TABLET | Freq: Three times a day (TID) | ORAL | Status: DC | PRN

## 2013-04-06 NOTE — Tx Team (Signed)
Initial Interdisciplinary Treatment Plan  PATIENT STRENGTHS: (choose at least two) Communication skills General fund of knowledge Motivation for treatment/growth  PATIENT STRESSORS: Financial difficulties Health problems Marital or family conflict Medication change or noncompliance Substance abuse   PROBLEM LIST: Problem List/Patient Goals Date to be addressed Date deferred Reason deferred Estimated date of resolution  Substance abuse 04/06/2013   D/c        Suicide ideation 04/06/2013   D/c        depression 04/06/2013   D/c                           DISCHARGE CRITERIA:  Adequate post-discharge living arrangements Improved stabilization in mood, thinking, and/or behavior Medical problems require only outpatient monitoring Motivation to continue treatment in a less acute level of care Need for constant or close observation no longer present Reduction of life-threatening or endangering symptoms to within safe limits Safe-care adequate arrangements made Verbal commitment to aftercare and medication compliance Withdrawal symptoms are absent or subacute and managed without 24-hour nursing intervention  PRELIMINARY DISCHARGE PLAN: Attend aftercare/continuing care group Attend PHP/IOP Attend 12-step recovery group Outpatient therapy Placement in alternative living arrangements  PATIENT/FAMIILY INVOLVEMENT: This treatment plan has been presented to and reviewed with the patient, [name].  The patient and family have been given the opportunity to ask questions and make suggestions.  [name] 04/06/2013, 6:52 PM

## 2013-04-06 NOTE — Progress Notes (Signed)
Per TTS, pt accepted to [address] Dr. Tawni Carnes to Dr. Dub Mikes. Pt refused to consent to release information, and will be transported involuntarily to [facility] by [employer]. RN and NP aware.   Catha Gosselin, LCSW [phone]  ED CSW .04/06/2013 1403pm
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
## 2013-04-06 NOTE — Progress Notes (Signed)
Pt was seen with NP. Agree with above assessment and plan. 
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
## 2013-04-07 ENCOUNTER — Encounter (HOSPITAL_COMMUNITY): Payer: Self-pay | Admitting: Psychiatry

## 2013-04-07 DIAGNOSIS — F322 Major depressive disorder, single episode, severe without psychotic features: Secondary | ICD-10-CM

## 2013-04-07 DIAGNOSIS — F1994 Other psychoactive substance use, unspecified with psychoactive substance-induced mood disorder: Secondary | ICD-10-CM

## 2013-04-07 DIAGNOSIS — F329 Major depressive disorder, single episode, unspecified: Secondary | ICD-10-CM | POA: Diagnosis present

## 2013-04-07 DIAGNOSIS — F1410 Cocaine abuse, uncomplicated: Secondary | ICD-10-CM | POA: Diagnosis present

## 2013-04-07 DIAGNOSIS — F411 Generalized anxiety disorder: Secondary | ICD-10-CM | POA: Diagnosis present

## 2013-04-07 LAB — TSH: TSH: 24.496 u[IU]/mL — ABNORMAL HIGH (ref 0.350–4.500)

## 2013-04-07 LAB — GLUCOSE, CAPILLARY
Glucose-Capillary: 369 mg/dL — ABNORMAL HIGH (ref 70–99)
Glucose-Capillary: 461 mg/dL — ABNORMAL HIGH (ref 70–99)

## 2013-04-07 MED ORDER — INSULIN ASPART 100 UNIT/ML ~~LOC~~ SOLN
0.0000 [IU] | Freq: Three times a day (TID) | SUBCUTANEOUS | Status: DC
  Administered 2013-04-08: 3 [IU] via SUBCUTANEOUS
  Administered 2013-04-08: 15 [IU] via SUBCUTANEOUS

## 2013-04-07 MED ORDER — INSULIN GLARGINE 100 UNIT/ML ~~LOC~~ SOLN
26.0000 [IU] | Freq: Every day | SUBCUTANEOUS | Status: DC
  Administered 2013-04-07: 26 [IU] via SUBCUTANEOUS

## 2013-04-07 MED ORDER — INSULIN ASPART 100 UNIT/ML ~~LOC~~ SOLN
15.0000 [IU] | Freq: Once | SUBCUTANEOUS | Status: AC
  Administered 2013-04-07: 15 [IU] via SUBCUTANEOUS

## 2013-04-07 MED ORDER — NICOTINE 21 MG/24HR TD PT24
21.0000 mg | MEDICATED_PATCH | Freq: Every day | TRANSDERMAL | Status: DC
  Administered 2013-04-07 – 2013-04-08 (×2): 21 mg via TRANSDERMAL
  Filled 2013-04-07 (×4): qty 1

## 2013-04-07 MED ORDER — INSULIN ASPART 100 UNIT/ML ~~LOC~~ SOLN: 0.0000 [IU] | Freq: Every day | SUBCUTANEOUS | Status: DC

## 2013-04-07 MED ORDER — SERTRALINE HCL 50 MG PO TABS
150.0000 mg | ORAL_TABLET | Freq: Every day | ORAL | Status: DC
  Administered 2013-04-08: 150 mg via ORAL
  Filled 2013-04-07 (×3): qty 3

## 2013-04-07 MED ORDER — GABAPENTIN 100 MG PO CAPS
100.0000 mg | ORAL_CAPSULE | Freq: Three times a day (TID) | ORAL | Status: DC
  Administered 2013-04-07 – 2013-04-08 (×3): 100 mg via ORAL
  Filled 2013-04-07 (×8): qty 1

## 2013-04-07 NOTE — Progress Notes (Signed)
CRITICAL VALUE ALERT  Critical value received:  CBG 461   Date of notification:  04/07/13  Time of notification:  1830pm   Critical value read back:yes  Nurse who received alert:  Janeece Riggers  MD notified (1st page):  Dr. Lolly Mustache   Time of first page:  1832pm  MD Notified (2nd Page):  Dr. Lolly Mustache  Time of 2nd page:  1840pm  Responding MD:  Dr. Lolly Mustache  Time MD responded:  1843pm  No new orders given at this time, per Dr. Lolly Mustache he wants night PA to see patient tonight when he comes on shift to assess need for medication adjustments.

## 2013-04-07 NOTE — [facility] Group Notes (Signed)
[facility] LCSW Group Therapy  04/07/2013  1:15 PM   Type of Therapy:  Group Therapy  Participation Level:  Active  Participation Quality:  Appropriate and Attentive  Affect:  Appropriate  Cognitive:  Alert and Appropriate  Insight:  Developing/Improving and Engaged  Engagement in Therapy:  Developing/Improving and Engaged  Modes of Intervention:  Activity, Clarification, Confrontation, Discussion, Education, Exploration, Limit-setting, Orientation, Problem-solving, Rapport Building, Reality Testing, Socialization and Support  Summary of Progress/Problems: [facility] speaker did not come to group today. CSW educated patients about various community resources including [facility], upcoming dental clinic, and [employer] [facility]. CSW reviewed various groups and services offered by [facility]. Pt was attentive to group discussion on how these support groups and resources are beneficial wrap around services, in addition to their follow up.  Pt invited to ask questions or discuss any experiences with these services and share this information with other group members.   Braydan Marriott Horton, LCSW 04/07/2013 2:25 PM   
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
## 2013-04-07 NOTE — Progress Notes (Signed)
Pt has spent most of the evening in her room in bed.  She got up briefly to go to the dayroom for a snack.  Pt came to the med window for her hs medications.  Pt's CBG was still high at 314.  Pt was given her Lantus at this time.  Pt states she is still having some passive suicidal thoughts, but contracts for safety.  She denies HI/AV at this time.  Pt makes her needs known to staff.  Support and encouragement offered.  Safety maintained with q15 minute checks.

## 2013-04-07 NOTE — Progress Notes (Signed)
Adult Psychoeducational Group Note  Date:  04/07/2013 Time:  1:25 PM  Group Topic/Focus:  Recovery Goals:   The focus of this group is to identify appropriate goals for recovery and establish a plan to achieve them.  Participation Level:  None  Participation Quality:  Drowsy and Inattentive  Affect:  Flat  Cognitive:  Lacking  Insight: None  Engagement in Group:  None  Modes of Intervention:  Activity, Discussion, Exploration, Socialization and Support  Additional Comments:  Pt came to group and slept for the majority of the time and when asked to share pt stated that she "did not want to share today, but maybe tomorrow".   Cathlean Cower 04/07/2013, 1:25 PM

## 2013-04-07 NOTE — Progress Notes (Signed)
The focus of this group is to educate the patient on the purpose and policies of crisis stabilization and provide a format to answer questions about their admission.  The group details unit policies and expectations of patients while admitted.  DID NOT ATTEND. 

## 2013-04-07 NOTE — Progress Notes (Signed)
CRITICAL VALUE ALERT  Critical value received:  CBG 465    Date of notification:  04/07/13  Time of notification:  1700 pm  Critical value read back:yes  Nurse who received alert:  Janeece Riggers  MD notified (1st page):  Dr. Lolly Mustache   Time of first page:  1705pm  Responding MD:  Dr. Lolly Mustache  Time MD responded:  1710 pm  New orders given to give 15 units Novolog per SSI orders, Hgb A1C in am, and to recheck CBG in 2 hours.

## 2013-04-07 NOTE — H&P (Signed)
Psychiatric Admission Assessment Adult  Patient Identification:  [name] Date of Evaluation:  04/07/2013 Chief Complaint:  DEPRESSIVE DISORDER History of Present Illness:: 57 Y/O female who states she has been more depressed last week. A nephew died in car wreck 2 weeks ago, tired of being on pain knee, back. Was out of her  Oxycodone, ran out of her Valium. She does admit to using cocaine. She is trying to get out of where she lives now, has charges pending for trafficking opioids what she denies as states they are going to drop  Elements:  Location:  in patient. Quality:  unable to function. Severity:  severe. Timing:  every day. Duration:  last couple of weeks. Context:  depression death of nephew with chronic pain some cocaine abuse, other stressors. Associated Signs/Synptoms: Depression Symptoms:  depressed mood, anhedonia, hypersomnia, fatigue, anxiety, panic attacks, hypersomnia, loss of energy/fatigue, disturbed sleep, decreased appetite, Thoughts of suicide, thinking about OD (Hypo) Manic Symptoms:  Irritable Mood, Labiality of Mood, Anxiety Symptoms:  Excessive Worry, Panic Symptoms, Psychotic Symptoms:  Hallucinations: Auditory Paranoia, PTSD Symptoms: Negative  Psychiatric Specialty Exam: Physical Exam  Review of Systems  Constitutional: Positive for weight loss and malaise/fatigue.  Eyes: Positive for pain.  Respiratory: Positive for cough and shortness of breath.        Occasional 5 cigarettes  Cardiovascular: Positive for chest pain and palpitations.  Gastrointestinal: Positive for heartburn, nausea and diarrhea.  Genitourinary: Negative.   Musculoskeletal: Positive for back pain, joint pain and neck pain.  Skin: Negative.   Neurological: Positive for dizziness, tremors and weakness.  Endo/Heme/Allergies: Negative.   Psychiatric/Behavioral: Positive for depression. The patient is nervous/anxious and has insomnia.     Blood pressure 112/68, pulse  82, temperature 97.2 F (36.2 C), temperature source Oral, resp. rate 18, height 5\' 2"  (1.575 m), weight 85.276 kg (188 lb).Body mass index is 34.38 kg/(m^2).  General Appearance: Fairly Groomed  Patent attorney::  Fair  Speech:  Clear and Coherent and Slow  Volume:  Decreased  Mood:  Depressed  Affect:  Restricted  Thought Process:  Coherent and Goal Directed  Orientation:  Full (Time, Place, and Person)  Thought Content:  symptoms, somatic complains, worries and concerns  Suicidal Thoughts:  Yes, no plans or intent  Homicidal Thoughts:  No  Memory:  Immediate;   Fair Recent;   Fair Remote;   Fair  Judgement:  Fair  Insight:  Present and superficial  Psychomotor Activity:  Restlessness  Concentration:  Fair  Recall:  Fair  Akathisia:  No  Handed:    AIMS (if indicated):     Assets:  Desire for Improvement  Sleep:  Number of Hours: 6.5    Past Psychiatric History: Diagnosis:  Hospitalizations: [facility]  Outpatient Care: Dr. Midge Aver no counseling  Substance Abuse Care: [facility]  Self-Mutilation: Denies  Suicidal Attempts: Yes  Violent Behaviors: Denies   Past Medical History:   Past Medical History  Diagnosis Date  . Asthma     past 10 years  . Diabetes mellitus     past 5 years  . Glaucoma     dx 06/2011-[facility]  . Hypertension   . Hyperlipidemia   . Hyperthyroidism   . History of TIA (transient ischemic attack)     01/06/2011, x2  . Pancreatitis   . Gout   . Depression   . Renal disorder     chronic kidney disease, stage 3  . Chronic pain   . Glaucoma   .  Arthritis     djd -lower back, left knee  . Degenerative disc disease   . Bipolar disorder   . Gastritis   . COPD (chronic obstructive pulmonary disease)   . Schizophrenia   . 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 Colon polyps     colonoscopy 01/2013   . Fatty liver     mild CT 06/2012   Loss of Consciousness:  hit head Traumatic Brain Injury:  Blunt Trauma Allergies:    Allergies  Allergen Reactions  . Hydrocodone-Acetaminophen Hives  . Metformin Diarrhea   PTA Medications: Prescriptions prior to admission  Medication Sig Dispense Refill  . albuterol (PROVENTIL HFA;VENTOLIN HFA) 108 (90 BASE) MCG/ACT inhaler Inhale 2 puffs into the lungs every 6 (six) hours as needed for wheezing.  1 Inhaler  2  . allopurinol (ZYLOPRIM) 300 MG tablet Take 300 mg by mouth daily.      Marland Kitchen amLODipine (NORVASC) 5 MG tablet Take 1 tablet (5 mg total) by mouth daily.  30 tablet  0  . aspirin EC 81 MG tablet Take 1 tablet (81 mg total) by mouth daily.  30 tablet  11  . atorvastatin (LIPITOR) 80 MG tablet Take 1 tablet (80 mg total) by mouth daily at 6 PM.  30 tablet  1  . brimonidine (ALPHAGAN) 0.2 % ophthalmic solution Place 1 drop into both eyes 2 (two) times daily.      . colchicine 0.6 MG tablet Take 0.6 mg by mouth daily as needed (for gout flare-ups).       . diazepam (VALIUM) 5 MG tablet Take 5 mg by mouth every 12 (twelve) hours as needed for anxiety.      . divalproex (DEPAKOTE ER) 500 MG 24 hr tablet Take 1,000 mg by mouth at bedtime.      . Fluticasone-Salmeterol (ADVAIR DISKUS) 250-50 MCG/DOSE AEPB Inhale 1 puff into the lungs 2 times daily at 12 noon and 4 pm.  60 each  0  . insulin aspart (NOVOLOG) 100 UNIT/ML injection Inject 4 Units into the skin 3 (three) times daily with meals.  1 vial  12  . insulin glargine (LANTUS) 100 UNIT/ML injection Inject 20 Units into the skin at bedtime.      Marland Kitchen latanoprost (XALATAN) 0.005 % ophthalmic solution Place 1 drop into both eyes at bedtime.  2.5 mL  0  . levothyroxine (SYNTHROID, LEVOTHROID) 125 MCG tablet Take 250 mcg by mouth daily before breakfast.      . lisinopril (PRINIVIL,ZESTRIL) 40 MG tablet Take 40 mg by mouth daily.      . nicotine (NICODERM CQ - DOSED IN MG/24 HOURS) 14 mg/24hr patch Place 1 patch onto the skin daily.       . nitroGLYCERIN (NITROSTAT) 0.4 MG SL tablet Place 1 tablet (0.4 mg total) under the tongue  every 5 (five) minutes as needed for chest pain.  30 tablet  3  . OLANZapine (ZYPREXA) 10 MG tablet Take 10 mg by mouth at bedtime.      . Oxycodone HCl 10 MG TABS Take 10 mg by mouth every 12 (twelve) hours.      . pantoprazole (PROTONIX) 40 MG tablet Take 1 tablet (40 mg total) by mouth daily.  30 tablet  11  . sertraline (ZOLOFT) 25 MG tablet Take 100 mg by mouth daily.       . traZODone (DESYREL) 100 MG tablet Take 100 mg by mouth at bedtime.        Previous Psychotropic  Medications:  Medication/Dose  Zoloft, Valium               Substance Abuse History in the last 12 months:  yes  Consequences of Substance Abuse: Negative  Social History:  reports that she has been smoking Cigarettes.  She has a .3 pack-year smoking history. She has never used smokeless tobacco. She reports that she uses illicit drugs (Cocaine). She reports that she does not drink alcohol. Additional Social History: Pain Medications: oxycodone Prescriptions: albuterol, allopurinol, narvas, aspiring EC 81 mg., lipitor, alphagen, colchicine, valium, depakote, advair, novolog, lantus, xalatin, synthroid, lisinopril, nitroglycerin, zyprexa, oxycodone, protonix, zoloft, trazadone Over the Counter: none History of alcohol / drug use?: Yes Longest period of sobriety (when/how long): several years Negative Consequences of Use: Financial;Personal relationships Withdrawal Symptoms: Irritability                    Current Place of Residence:  Lives by herself Place of Birth:   Family Members: Marital Status:  Divorced Children:  Sons: 29, 30, 80  Daughters: Relationships: Education:  some Corporate treasurer Problems/Performance: Religious Beliefs/Practices: non denominational History of Abuse (Emotional/Phsycial/Sexual) Denies Armed forces technical officer;  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 Legal History: writing checks, fraud, has a trafficking  charge pending (percocets) Hobbies/Interests:  Family History:   Family History  Problem Relation Age of Onset  . Arthritis Mother   . Diabetes Mother   . Hypertension Mother   . Kidney disease Mother   . Heart disease Mother     died 38  . Heart disease      mother & father  . Hypertension      mother & father  . Colon cancer Neg Hx   . Prostate cancer Neg Hx   . Breast cancer Neg Hx   . Stomach cancer Neg Hx   . Hypertension Sister   . Hypertension Brother   . Kidney disease Brother     Results for orders placed during the hospital encounter of 04/06/13 (from the past 72 hour(s))  GLUCOSE, CAPILLARY     Status: Abnormal   Collection Time    04/06/13  5:41 PM      Result Value Range   Glucose-Capillary 349 (*) 70 - 99 mg/dL  TSH     Status: Abnormal   Collection Time    04/06/13  8:09 PM      Result Value Range   TSH 24.496 (*) 0.350 - 4.500 uIU/mL   Comment: Performed at [facility]  GLUCOSE, CAPILLARY     Status: Abnormal   Collection Time    04/06/13  9:53 PM      Result Value Range   Glucose-Capillary 314 (*) 70 - 99 mg/dL   Comment 1 Notify RN    GLUCOSE, CAPILLARY     Status: Abnormal   Collection Time    04/07/13  6:21 AM      Result Value Range   Glucose-Capillary 348 (*) 70 - 99 mg/dL  GLUCOSE, CAPILLARY     Status: Abnormal   Collection Time    04/07/13 11:39 AM      Result Value Range   Glucose-Capillary 369 (*) 70 - 99 mg/dL   Psychological Evaluations:  Assessment:   DSM5:  Schizophrenia Disorders:  none Obsessive-Compulsive Disorders:  none Trauma-Stressor Disorders:  none Substance/Addictive Disorders:  Cocaine use disorders Depressive Disorders:  Major Depressive Disorder - Severe (296.23)  AXIS I:  Substance Induced  Mood Disorder AXIS II:  Deferred AXIS III:   Past Medical History  Diagnosis Date  . Asthma     past 10 years  . Diabetes mellitus     past 5 years  . Glaucoma     dx 06/2011-[facility]  .  Hypertension   . Hyperlipidemia   . Hyperthyroidism   . History of TIA (transient ischemic attack)     01/06/2011, x2  . Pancreatitis   . Gout   . Depression   . Renal disorder     chronic kidney disease, stage 3  . Chronic pain   . Glaucoma   . Arthritis     djd -lower back, left knee  . Degenerative disc disease   . Bipolar disorder   . Gastritis   . COPD (chronic obstructive pulmonary disease)   . Schizophrenia   . 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 Colon polyps     colonoscopy 01/2013   . Fatty liver     mild CT 06/2012   AXIS IV:  housing problems, problems related to legal system/crime and problems with primary support group AXIS V:  41-50 serious symptoms  Treatment Plan/Recommendations:  Supportive approach/coping skills/relapse prevention                                                                 CBT;mindfulness                                                                 Optimize treatment with psychotropics                                                                   Treatment Plan Summary: Daily contact with patient to assess and evaluate symptoms and progress in treatment Medication management Current Medications:  Current Facility-Administered Medications  Medication Dose Route Frequency Provider Last Rate Last Dose  . acetaminophen (TYLENOL) tablet 650 mg  650 mg Oral Q6H PRN Earney Navy, NP      . albuterol (PROVENTIL HFA;VENTOLIN HFA) 108 (90 BASE) MCG/ACT inhaler 2 puff  2 puff Inhalation Q6H PRN Earney Navy, NP      . alum & mag hydroxide-simeth (MAALOX/MYLANTA) 200-200-20 MG/5ML suspension 30 mL  30 mL Oral Q4H PRN Earney Navy, NP      . amLODipine (NORVASC) tablet 5 mg  5 mg Oral Daily Earney Navy, NP      . aspirin EC tablet 81 mg  81 mg Oral Daily Earney Navy, NP   81 mg at 04/07/13 0823  . atorvastatin (LIPITOR) tablet 80 mg  80 mg Oral q1800 Earney Navy, NP   80 mg at 04/06/13 2017   . brimonidine (ALPHAGAN) 0.15 % ophthalmic solution 1 drop  1 drop Both Eyes BID  Earney Navy, NP   1 drop at 04/07/13 [phone]  . cloNIDine (CATAPRES) tablet 0.1 mg  0.1 mg Oral QID Earney Navy, NP   0.1 mg at 04/07/13 1149   Followed by  . [START ON 04/09/2013] cloNIDine (CATAPRES) tablet 0.1 mg  0.1 mg Oral BH-qamhs Earney Navy, NP       Followed by  . [START ON 04/11/2013] cloNIDine (CATAPRES) tablet 0.1 mg  0.1 mg Oral QAC breakfast Earney Navy, NP      . dicyclomine (BENTYL) tablet 20 mg  20 mg Oral Q6H PRN Earney Navy, NP      . divalproex (DEPAKOTE ER) 24 hr tablet 1,000 mg  1,000 mg Oral QHS Earney Navy, NP   1,000 mg at 04/06/13 2157  . hydrOXYzine (ATARAX/VISTARIL) tablet 25 mg  25 mg Oral Q6H PRN Earney Navy, NP      . insulin aspart (novoLOG) injection 0-15 Units  0-15 Units Subcutaneous TID WC Earney Navy, NP   15 Units at 04/07/13 1149  . insulin glargine (LANTUS) injection 20 Units  20 Units Subcutaneous QHS Earney Navy, NP   20 Units at 04/06/13 2207  . latanoprost (XALATAN) 0.005 % ophthalmic solution 1 drop  1 drop Both Eyes QHS Earney Navy, NP   1 drop at 04/06/13 2206  . levothyroxine (SYNTHROID, LEVOTHROID) tablet 250 mcg  250 mcg Oral QAC breakfast Earney Navy, NP   250 mcg at 04/07/13 1191  . lisinopril (PRINIVIL,ZESTRIL) tablet 40 mg  40 mg Oral Daily Earney Navy, NP      . loperamide (IMODIUM) capsule 2-4 mg  2-4 mg Oral PRN Earney Navy, NP      . LORazepam (ATIVAN) tablet 1 mg  1 mg Oral Q8H PRN Earney Navy, NP   1 mg at 04/07/13 0828  . magnesium hydroxide (MILK OF MAGNESIA) suspension 30 mL  30 mL Oral Daily PRN Earney Navy, NP      . methocarbamol (ROBAXIN) tablet 500 mg  500 mg Oral Q8H PRN Earney Navy, NP      . mometasone-formoterol (DULERA) 100-5 MCG/ACT inhaler 2 puff  2 puff Inhalation BID Earney Navy, NP   2 puff at 04/07/13 0824  . naproxen  (NAPROSYN) tablet 500 mg  500 mg Oral BID PRN Earney Navy, NP   500 mg at 04/07/13 0828  . nicotine (NICODERM CQ - dosed in mg/24 hours) patch 21 mg  21 mg Transdermal Q0600 Kerry Hough, PA-C   21 mg at 04/07/13 4782  . OLANZapine (ZYPREXA) tablet 10 mg  10 mg Oral QHS Earney Navy, NP   10 mg at 04/06/13 2157  . ondansetron (ZOFRAN) tablet 4 mg  4 mg Oral Q8H PRN Earney Navy, NP      . ondansetron (ZOFRAN-ODT) disintegrating tablet 4 mg  4 mg Oral Q6H PRN Earney Navy, NP      . pantoprazole (PROTONIX) EC tablet 40 mg  40 mg Oral Daily Earney Navy, NP   40 mg at 04/07/13 0823  . sertraline (ZOLOFT) tablet 100 mg  100 mg Oral Daily Earney Navy, NP   100 mg at 04/07/13 0823  . traZODone (DESYREL) tablet 100 mg  100 mg Oral QHS Earney Navy, NP   100 mg at 04/06/13 2157    Observation Level/Precautions:  15 minute checks  Laboratory:  As per the ED  Psychotherapy:  Individual/group  Medications:  Assess for detox needs, optimize treatment with psychotropics  Consultations:    Discharge Concerns:    Estimated LOS: 3-5 days  Other:     I certify that inpatient services furnished can reasonably be expected to improve the patient's condition.   Zavia Pullen A 11/11/20142:49 PM

## 2013-04-07 NOTE — [facility] Suicide Risk Assessment (Signed)
[facility] INPATIENT:  Family/Significant Other Suicide Prevention Education  Suicide Prevention Education:  Education Completed; [name] - sister [phone]),  (name of family member/significant other) has been identified by the patient as the family member/significant other with whom the patient will be residing, and identified as the person(s) who will aid the patient in the event of a mental health crisis (suicidal ideations/suicide attempt).  With written consent from the patient, the family member/significant other has been provided the following suicide prevention education, prior to the and/or following the discharge of the patient.  The suicide prevention education provided includes the following:  Suicide risk factors  Suicide prevention and interventions  National Suicide Hotline telephone number  [facility] assessment telephone number  [facility] Emergency Assistance 911  [facility] and/or Residential Mobile Crisis Unit telephone number  Request made of family/significant other to:  Remove weapons (e.g., guns, rifles, knives), all items previously/currently identified as safety concern.    Remove drugs/medications (over-the-counter, prescriptions, illicit drugs), all items previously/currently identified as a safety concern.  The family member/significant other verbalizes understanding of the suicide prevention education information provided.  The family member/significant other agrees to remove the items of safety concern listed above.  Carmina Miller 04/07/2013, 2:47 PM

## 2013-04-07 NOTE — Progress Notes (Signed)
D:  Per pt self inventory pt reports sleeping fair, appetite improving, energy level low, ability to pay attention improving, rates depression at an 8 out of 10 and hopelessness at a 9 out of 10, denies HI/AVH, endorses on and off passive SI, contracts for safety.     A:  Emotional support provided, Encouraged pt to continue with treatment plan and attend all group activities, q15 min checks maintained for safety.  R:  Pt did not attend am group, cooperative with staff and other pts.

## 2013-04-07 NOTE — Progress Notes (Signed)
Recreation Therapy Notes  Date: 11.11.2014 Time: 2:30pm Location: [address] Dayroom  Group Topic: Animal Assisted Activities (AAA)  Behavioral Response: Did not attend.    Breshay Ilg L Shizuo Biskup, LRT/CTRS  Zanaya Baize L 04/07/2013 5:08 PM 

## 2013-04-07 NOTE — [facility] Counselor (Signed)
Adult Comprehensive Assessment  Patient ID: [name], female   DOB: [birth year], 57 y.o.   MRN: [id number]  Information Source: Information source: Patient  Current Stressors:  Educational / Learning stressors: N/A Employment / Job issues: on disability Family Relationships: N/A Surveyor, quantity / Lack of resources (include bankruptcy): on fixed income Housing / Lack of housing: N/A Physical health (include injuries & life threatening diseases): health issues  Social relationships: issues with her ex boyfriend but reports that she left him/kicked him out of her house Substance abuse: N/A Bereavement / Loss: N/A  Living/Environment/Situation:  Living Arrangements: Alone Living conditions (as described by patient or guardian): Pt lives alone in [place].  Pt states that this is a good environment.   How long has patient lived in current situation?: 1 year What is atmosphere in current home: Supportive;Loving;Comfortable  Family History:  Marital status: Divorced Divorced, when?: in 2010 What types of issues is patient dealing with in the relationship?: pt states that they didn't get along Additional relationship information: N/A Does patient have children?: No  Childhood History:  By whom was/is the patient raised?: Mother Additional childhood history information: Pt states that she was raised by her mother due to her father passing away when she was 34 years old.  Pt states that she had a good childhood.  Description of patient's relationship with caregiver when they were a child: Pt reports having a good relationship with mother growing up.  Patient's description of current relationship with people who raised him/her: Mother is deceased.   Does patient have siblings?: Yes Number of Siblings: 3 Description of patient's current relationship with siblings: Pt reports being close to her siblings.   Did patient suffer any verbal/emotional/physical/sexual abuse as a child?: No Did  patient suffer from severe childhood neglect?: No Has patient ever been sexually abused/assaulted/raped as an adolescent or adult?: No Was the patient ever a victim of a crime or a disaster?: No Witnessed domestic violence?: No Has patient been effected by domestic violence as an adult?: No  Education:  Highest grade of school patient has completed: 2 years in college Currently a student?: No Learning disability?: No  Employment/Work Situation:   Employment situation: On disability Why is patient on disability: Mental Illness and back issues How long has patient been on disability: 2 years Patient's job has been impacted by current illness: No What is the longest time patient has a held a job?: 3 years Where was the patient employed at that time?: Restaurant Has patient ever been in the [employer]?: No Has patient ever served in Buyer, retail?: No  Financial Resources:   Surveyor, quantity resources: Pharmacologist;Food stamps Does patient have a representative payee or guardian?: No  Alcohol/Substance Abuse:   What has been your use of drugs/alcohol within the last 12 months?: Pt denies alcohol and drug abuse If attempted suicide, did drugs/alcohol play a role in this?: No Alcohol/Substance Abuse Treatment Hx: Past Tx, Inpatient;Attends AA/NA If yes, describe treatment: [facility] - inpatient treatment Has alcohol/substance abuse ever caused legal problems?: No  Social Support System:   Patient's Community Support System: Fair Describe Community Support System: Pt reports her family is supportive but don't know what's going on with her.  Type of faith/religion: Ephriam Knuckles How does patient's faith help to cope with current illness?: prayer and church attendance  Leisure/Recreation:   Leisure and Hobbies: watching TV  Strengths/Needs:   What things does the patient do well?: pt states that she is good at listening.   In what  areas does patient struggle / problems for patient:  Depression, anxiety and SI  Discharge Plan:   Does patient have access to transportation?: Yes Will patient be returning to same living situation after discharge?: Yes Currently receiving community mental health services: No If no, would patient like referral for services when discharged?: Yes (What county?) [facility]) Does patient have financial barriers related to discharge medications?: No  Summary/Recommendations:     Patient is a 57 year old African American female with a diagnosis of Depressive Disorder NOS, Mood Disorder NOS and Substance Abuse.  Patient lives in [place] alone.  Pt reports being sick and tired and wanting to die.  Pt reports conflict with her ex boyfriend.  Pt reports occasional cocaine use.  Patient will benefit from crisis stabilization, medication evaluation, group therapy and psycho education in addition to case management for discharge planning.    Horton, Salome Arnt. 04/07/2013

## 2013-04-08 ENCOUNTER — Observation Stay (HOSPITAL_COMMUNITY)
Admission: AD | Admit: 2013-04-08 | Discharge: 2013-04-09 | Disposition: A | Discharge status: Other Acute Inpatient Hospital | Payer: PRIVATE HEALTH INSURANCE | Source: Ambulatory Visit | Attending: Internal Medicine | Admitting: Internal Medicine

## 2013-04-08 ENCOUNTER — Encounter (HOSPITAL_COMMUNITY): Payer: Self-pay | Admitting: *Deleted

## 2013-04-08 DIAGNOSIS — F341 Dysthymic disorder: Secondary | ICD-10-CM

## 2013-04-08 DIAGNOSIS — E039 Hypothyroidism, unspecified: Secondary | ICD-10-CM | POA: Diagnosis present

## 2013-04-08 DIAGNOSIS — F17200 Nicotine dependence, unspecified, uncomplicated: Secondary | ICD-10-CM | POA: Insufficient documentation

## 2013-04-08 DIAGNOSIS — Z8673 Personal history of transient ischemic attack (TIA), and cerebral infarction without residual deficits: Secondary | ICD-10-CM | POA: Insufficient documentation

## 2013-04-08 DIAGNOSIS — R7309 Other abnormal glucose: Secondary | ICD-10-CM

## 2013-04-08 DIAGNOSIS — R9431 Abnormal electrocardiogram [ECG] [EKG]: Secondary | ICD-10-CM | POA: Insufficient documentation

## 2013-04-08 DIAGNOSIS — M549 Dorsalgia, unspecified: Secondary | ICD-10-CM | POA: Insufficient documentation

## 2013-04-08 DIAGNOSIS — M109 Gout, unspecified: Secondary | ICD-10-CM | POA: Diagnosis present

## 2013-04-08 DIAGNOSIS — Z79899 Other long term (current) drug therapy: Secondary | ICD-10-CM | POA: Insufficient documentation

## 2013-04-08 DIAGNOSIS — G609 Hereditary and idiopathic neuropathy, unspecified: Secondary | ICD-10-CM | POA: Insufficient documentation

## 2013-04-08 DIAGNOSIS — IMO0002 Reserved for concepts with insufficient information to code with codable children: Secondary | ICD-10-CM | POA: Diagnosis present

## 2013-04-08 DIAGNOSIS — H409 Unspecified glaucoma: Secondary | ICD-10-CM | POA: Insufficient documentation

## 2013-04-08 DIAGNOSIS — E1142 Type 2 diabetes mellitus with diabetic polyneuropathy: Secondary | ICD-10-CM | POA: Insufficient documentation

## 2013-04-08 DIAGNOSIS — E785 Hyperlipidemia, unspecified: Secondary | ICD-10-CM | POA: Diagnosis present

## 2013-04-08 DIAGNOSIS — J449 Chronic obstructive pulmonary disease, unspecified: Secondary | ICD-10-CM | POA: Diagnosis present

## 2013-04-08 DIAGNOSIS — E119 Type 2 diabetes mellitus without complications: Secondary | ICD-10-CM

## 2013-04-08 DIAGNOSIS — F329 Major depressive disorder, single episode, unspecified: Secondary | ICD-10-CM | POA: Insufficient documentation

## 2013-04-08 DIAGNOSIS — G629 Polyneuropathy, unspecified: Secondary | ICD-10-CM

## 2013-04-08 DIAGNOSIS — Z794 Long term (current) use of insulin: Secondary | ICD-10-CM | POA: Insufficient documentation

## 2013-04-08 DIAGNOSIS — E1149 Type 2 diabetes mellitus with other diabetic neurological complication: Principal | ICD-10-CM | POA: Insufficient documentation

## 2013-04-08 DIAGNOSIS — J441 Chronic obstructive pulmonary disease with (acute) exacerbation: Secondary | ICD-10-CM

## 2013-04-08 DIAGNOSIS — N189 Chronic kidney disease, unspecified: Secondary | ICD-10-CM | POA: Insufficient documentation

## 2013-04-08 DIAGNOSIS — R739 Hyperglycemia, unspecified: Secondary | ICD-10-CM | POA: Diagnosis present

## 2013-04-08 DIAGNOSIS — F209 Schizophrenia, unspecified: Secondary | ICD-10-CM | POA: Diagnosis present

## 2013-04-08 DIAGNOSIS — F418 Other specified anxiety disorders: Secondary | ICD-10-CM

## 2013-04-08 DIAGNOSIS — I10 Essential (primary) hypertension: Secondary | ICD-10-CM | POA: Diagnosis present

## 2013-04-08 DIAGNOSIS — I129 Hypertensive chronic kidney disease with stage 1 through stage 4 chronic kidney disease, or unspecified chronic kidney disease: Secondary | ICD-10-CM | POA: Insufficient documentation

## 2013-04-08 DIAGNOSIS — G8929 Other chronic pain: Secondary | ICD-10-CM | POA: Diagnosis present

## 2013-04-08 DIAGNOSIS — F1410 Cocaine abuse, uncomplicated: Secondary | ICD-10-CM

## 2013-04-08 LAB — CBC WITH DIFFERENTIAL/PLATELET
Basophils Absolute: 0 10*3/uL (ref 0.0–0.1)
Basophils Relative: 0 % (ref 0–1)
Eosinophils Absolute: 0.1 10*3/uL (ref 0.0–0.7)
HCT: 36.4 % (ref 36.0–46.0)
MCH: 28.2 pg (ref 26.0–34.0)
MCHC: 33 g/dL (ref 30.0–36.0)
Monocytes Relative: 7 % (ref 3–12)
Neutrophils Relative %: 59 % (ref 43–77)
Platelets: 244 10*3/uL (ref 150–400)
RDW: 15.9 % — ABNORMAL HIGH (ref 11.5–15.5)

## 2013-04-08 LAB — COMPREHENSIVE METABOLIC PANEL
Alkaline Phosphatase: 120 U/L — ABNORMAL HIGH (ref 39–117)
BUN: 18 mg/dL (ref 6–23)
CO2: 21 mEq/L (ref 19–32)
Chloride: 95 mEq/L — ABNORMAL LOW (ref 96–112)
Creatinine, Ser: 1.38 mg/dL — ABNORMAL HIGH (ref 0.50–1.10)
GFR calc Af Amer: 49 mL/min — ABNORMAL LOW (ref >90)
GFR calc non Af Amer: 42 mL/min — ABNORMAL LOW (ref >90)
Glucose, Bld: 429 mg/dL — ABNORMAL HIGH (ref 70–99)
Potassium: 4.2 mEq/L (ref 3.5–5.1)
Sodium: 131 mEq/L — ABNORMAL LOW (ref 135–145)
Total Bilirubin: 0.2 mg/dL — ABNORMAL LOW (ref 0.3–1.2)
Total Protein: 7.1 g/dL (ref 6.0–8.3)

## 2013-04-08 LAB — URINALYSIS, ROUTINE W REFLEX MICROSCOPIC
Bilirubin Urine: NEGATIVE
Ketones, ur: NEGATIVE mg/dL
Nitrite: NEGATIVE
Specific Gravity, Urine: 1.022 (ref 1.005–1.030)
pH: 5.5 (ref 5.0–8.0)

## 2013-04-08 LAB — MAGNESIUM: Magnesium: 1.7 mg/dL (ref 1.5–2.5)

## 2013-04-08 LAB — RAPID URINE DRUG SCREEN, HOSP PERFORMED
Amphetamines: NOT DETECTED
Barbiturates: NOT DETECTED
Benzodiazepines: NOT DETECTED
Cocaine: NOT DETECTED

## 2013-04-08 LAB — APTT: aPTT: 28 seconds (ref 24–37)

## 2013-04-08 LAB — GLUCOSE, CAPILLARY
Glucose-Capillary: 194 mg/dL — ABNORMAL HIGH (ref 70–99)
Glucose-Capillary: 513 mg/dL — ABNORMAL HIGH (ref 70–99)
Glucose-Capillary: 234 mg/dL — ABNORMAL HIGH (ref 70–99)

## 2013-04-08 LAB — URINE MICROSCOPIC-ADD ON

## 2013-04-08 LAB — HEMOGLOBIN A1C
Mean Plasma Glucose: 309 mg/dL — ABNORMAL HIGH (ref <117)
Mean Plasma Glucose: 344 mg/dL — ABNORMAL HIGH (ref <117)

## 2013-04-08 LAB — TSH: TSH: 12.047 u[IU]/mL — ABNORMAL HIGH (ref 0.350–4.500)

## 2013-04-08 LAB — PROTIME-INR: Prothrombin Time: 12.4 seconds (ref 11.6–15.2)

## 2013-04-08 MED ORDER — AMLODIPINE BESYLATE 5 MG PO TABS: 5.0000 mg | ORAL_TABLET | Freq: Every day | ORAL | Status: DC

## 2013-04-08 MED ORDER — LISINOPRIL 40 MG PO TABS: 40.0000 mg | ORAL_TABLET | Freq: Every day | ORAL | Status: DC

## 2013-04-08 MED ORDER — COLCHICINE 0.6 MG PO TABS: 0.6000 mg | ORAL_TABLET | Freq: Every day | ORAL | Status: DC | PRN

## 2013-04-08 MED ORDER — LORAZEPAM 1 MG PO TABS: 1.0000 mg | ORAL_TABLET | Freq: Three times a day (TID) | ORAL | Status: DC | PRN

## 2013-04-08 MED ORDER — GABAPENTIN 100 MG PO CAPS: 100.0000 mg | ORAL_CAPSULE | Freq: Three times a day (TID) | ORAL | Status: DC

## 2013-04-08 MED ORDER — MOMETASONE FURO-FORMOTEROL FUM 100-5 MCG/ACT IN AERO
2.0000 | INHALATION_SPRAY | Freq: Two times a day (BID) | RESPIRATORY_TRACT | Status: DC
  Administered 2013-04-08 – 2013-04-09 (×2): 2 via RESPIRATORY_TRACT
  Filled 2013-04-08: qty 8.8

## 2013-04-08 MED ORDER — ACETAMINOPHEN 650 MG RE SUPP: 650.0000 mg | Freq: Four times a day (QID) | RECTAL | Status: DC | PRN

## 2013-04-08 MED ORDER — HYDROCODONE-ACETAMINOPHEN 5-325 MG PO TABS: 1.0000 | ORAL_TABLET | ORAL | Status: DC | PRN

## 2013-04-08 MED ORDER — ASPIRIN EC 81 MG PO TBEC
81.0000 mg | DELAYED_RELEASE_TABLET | Freq: Every day | ORAL | Status: DC
  Administered 2013-04-09: 81 mg via ORAL
  Filled 2013-04-08: qty 1

## 2013-04-08 MED ORDER — TRAZODONE HCL 100 MG PO TABS
100.0000 mg | ORAL_TABLET | Freq: Every day | ORAL | Status: DC
  Administered 2013-04-08: 100 mg via ORAL
  Filled 2013-04-08 (×2): qty 1

## 2013-04-08 MED ORDER — INSULIN GLARGINE 100 UNIT/ML ~~LOC~~ SOLN
26.0000 [IU] | Freq: Every day | SUBCUTANEOUS | Status: DC
  Administered 2013-04-08: 26 [IU] via SUBCUTANEOUS
  Filled 2013-04-08 (×2): qty 0.26

## 2013-04-08 MED ORDER — ONDANSETRON HCL 4 MG/2ML IJ SOLN: 4.0000 mg | Freq: Four times a day (QID) | INTRAMUSCULAR | Status: DC | PRN

## 2013-04-08 MED ORDER — INSULIN ASPART 100 UNIT/ML ~~LOC~~ SOLN: 0.0000 [IU] | Freq: Three times a day (TID) | SUBCUTANEOUS | Status: DC

## 2013-04-08 MED ORDER — LORAZEPAM 1 MG PO TABS
1.0000 mg | ORAL_TABLET | Freq: Three times a day (TID) | ORAL | Status: DC | PRN
  Administered 2013-04-08: 1 mg via ORAL
  Filled 2013-04-08: qty 1

## 2013-04-08 MED ORDER — SERTRALINE HCL 50 MG PO TABS
150.0000 mg | ORAL_TABLET | Freq: Every day | ORAL | Status: DC
  Administered 2013-04-09: 150 mg via ORAL
  Filled 2013-04-08: qty 1

## 2013-04-08 MED ORDER — ALBUTEROL SULFATE HFA 108 (90 BASE) MCG/ACT IN AERS
2.0000 | INHALATION_SPRAY | Freq: Four times a day (QID) | RESPIRATORY_TRACT | Status: DC | PRN
Start: 2013-04-08 — End: 2013-04-09
  Filled 2013-04-08: qty 6.7

## 2013-04-08 MED ORDER — INSULIN ASPART 100 UNIT/ML ~~LOC~~ SOLN: 0.0000 [IU] | Freq: Every day | SUBCUTANEOUS | Status: DC

## 2013-04-08 MED ORDER — OLANZAPINE 10 MG PO TABS
10.0000 mg | ORAL_TABLET | Freq: Every day | ORAL | Status: DC
  Administered 2013-04-08: 10 mg via ORAL
  Filled 2013-04-08 (×2): qty 1

## 2013-04-08 MED ORDER — BRIMONIDINE TARTRATE 0.2 % OP SOLN
1.0000 [drp] | Freq: Two times a day (BID) | OPHTHALMIC | Status: DC
  Administered 2013-04-08 – 2013-04-09 (×2): 1 [drp] via OPHTHALMIC
  Filled 2013-04-08: qty 5

## 2013-04-08 MED ORDER — AMLODIPINE BESYLATE 5 MG PO TABS
5.0000 mg | ORAL_TABLET | Freq: Every day | ORAL | Status: DC
  Filled 2013-04-08: qty 1

## 2013-04-08 MED ORDER — ALLOPURINOL 300 MG PO TABS
300.0000 mg | ORAL_TABLET | Freq: Every day | ORAL | Status: DC
  Administered 2013-04-09: 300 mg via ORAL
  Filled 2013-04-08: qty 1

## 2013-04-08 MED ORDER — ATORVASTATIN CALCIUM 80 MG PO TABS: 80.0000 mg | ORAL_TABLET | Freq: Every day | ORAL | Status: DC

## 2013-04-08 MED ORDER — HYDROXYZINE HCL 25 MG PO TABS: 25.0000 mg | ORAL_TABLET | Freq: Four times a day (QID) | ORAL | Status: DC | PRN

## 2013-04-08 MED ORDER — PANTOPRAZOLE SODIUM 40 MG PO TBEC
40.0000 mg | DELAYED_RELEASE_TABLET | Freq: Every day | ORAL | Status: DC
Start: 2013-04-09 — End: 2013-04-09
  Administered 2013-04-09: 40 mg via ORAL
  Filled 2013-04-08: qty 1

## 2013-04-08 MED ORDER — ACETAMINOPHEN 325 MG PO TABS: 650.0000 mg | ORAL_TABLET | Freq: Four times a day (QID) | ORAL | Status: DC | PRN

## 2013-04-08 MED ORDER — TRAZODONE HCL 100 MG PO TABS: 100.0000 mg | ORAL_TABLET | Freq: Every day | ORAL | Status: DC

## 2013-04-08 MED ORDER — ATORVASTATIN CALCIUM 80 MG PO TABS
80.0000 mg | ORAL_TABLET | Freq: Every day | ORAL | Status: DC
  Filled 2013-04-08: qty 1

## 2013-04-08 MED ORDER — LATANOPROST 0.005 % OP SOLN
1.0000 [drp] | Freq: Every day | OPHTHALMIC | Status: DC
  Administered 2013-04-08: 1 [drp] via OPHTHALMIC
  Filled 2013-04-08: qty 2.5

## 2013-04-08 MED ORDER — ONDANSETRON HCL 4 MG PO TABS: 4.0000 mg | ORAL_TABLET | Freq: Four times a day (QID) | ORAL | Status: DC | PRN

## 2013-04-08 MED ORDER — OLANZAPINE 10 MG PO TABS: 10.0000 mg | ORAL_TABLET | Freq: Every day | ORAL | Status: DC

## 2013-04-08 MED ORDER — FENTANYL CITRATE 0.05 MG/ML IJ SOLN: 12.5000 ug | INTRAMUSCULAR | Status: DC | PRN

## 2013-04-08 MED ORDER — INSULIN ASPART 100 UNIT/ML ~~LOC~~ SOLN
0.0000 [IU] | Freq: Every day | SUBCUTANEOUS | Status: DC
  Administered 2013-04-08: 2 [IU] via SUBCUTANEOUS

## 2013-04-08 MED ORDER — DIVALPROEX SODIUM ER 500 MG PO TB24: 1000.0000 mg | ORAL_TABLET | Freq: Every day | ORAL | Status: DC

## 2013-04-08 MED ORDER — LEVOTHYROXINE SODIUM 125 MCG PO TABS: 250.0000 ug | ORAL_TABLET | Freq: Every day | ORAL | Status: DC

## 2013-04-08 MED ORDER — LEVOTHYROXINE SODIUM 125 MCG PO TABS
250.0000 ug | ORAL_TABLET | Freq: Every day | ORAL | Status: DC
  Administered 2013-04-09: 250 ug via ORAL
  Filled 2013-04-08 (×2): qty 2

## 2013-04-08 MED ORDER — HYDROXYZINE HCL 25 MG PO TABS
25.0000 mg | ORAL_TABLET | Freq: Four times a day (QID) | ORAL | Status: DC | PRN
Start: 2013-04-08 — End: 2013-04-09
  Administered 2013-04-08: 25 mg via ORAL
  Filled 2013-04-08 (×2): qty 1

## 2013-04-08 MED ORDER — INSULIN ASPART 100 UNIT/ML ~~LOC~~ SOLN
0.0000 [IU] | Freq: Three times a day (TID) | SUBCUTANEOUS | Status: DC
  Administered 2013-04-08: 11 [IU] via SUBCUTANEOUS
  Administered 2013-04-09: 15 [IU] via SUBCUTANEOUS
  Administered 2013-04-09 (×2): 4 [IU] via SUBCUTANEOUS

## 2013-04-08 MED ORDER — SERTRALINE HCL 50 MG PO TABS: 150.0000 mg | ORAL_TABLET | Freq: Every day | ORAL | Status: DC

## 2013-04-08 MED ORDER — COLCHICINE 0.6 MG PO TABS
0.6000 mg | ORAL_TABLET | Freq: Every day | ORAL | Status: DC | PRN
  Filled 2013-04-08: qty 1

## 2013-04-08 MED ORDER — GABAPENTIN 100 MG PO CAPS
100.0000 mg | ORAL_CAPSULE | Freq: Three times a day (TID) | ORAL | Status: DC
  Administered 2013-04-08 – 2013-04-09 (×4): 100 mg via ORAL
  Filled 2013-04-08 (×5): qty 1

## 2013-04-08 MED ORDER — OXYCODONE HCL 5 MG PO TABS
5.0000 mg | ORAL_TABLET | ORAL | Status: DC | PRN
  Administered 2013-04-08 – 2013-04-09 (×4): 5 mg via ORAL
  Filled 2013-04-08 (×4): qty 1

## 2013-04-08 MED ORDER — SODIUM CHLORIDE 0.9 % IV SOLN
INTRAVENOUS | Status: DC
  Administered 2013-04-08 – 2013-04-09 (×2): via INTRAVENOUS

## 2013-04-08 MED ORDER — FLUTICASONE-SALMETEROL 250-50 MCG/DOSE IN AEPB: 1.0000 | INHALATION_SPRAY | Freq: Two times a day (BID) | RESPIRATORY_TRACT | Status: DC

## 2013-04-08 MED ORDER — HYDROMORPHONE HCL PF 1 MG/ML IJ SOLN: 0.5000 mg | INTRAMUSCULAR | Status: DC | PRN

## 2013-04-08 MED ORDER — DIVALPROEX SODIUM ER 500 MG PO TB24
1000.0000 mg | ORAL_TABLET | Freq: Every day | ORAL | Status: DC
  Administered 2013-04-08: 1000 mg via ORAL
  Filled 2013-04-08 (×2): qty 2

## 2013-04-08 NOTE — Progress Notes (Signed)
Recreation Therapy Notes  Date: 11.11.2014 Time: 3:00pm Location: [address] Dayroom  Group Topic: Communication, Team Building, Problem Solving  Goal Area(s) Addresses:  Patient will effectively work with peer towards shared goal.  Patient will identify skill used to make activity successful.  Patient will identify how skills used during activity can be used to reach post d/c goals.   Behavioral Response: Observation, Appropriate   Intervention: Problem Solving Activity  Activity: Landing Pad. In teams patients were given 12 plastic drinking straws and a length of masking tape. Using the materials provided patients were asked to build a landing pad to catch a golf ball dropped from approximately 4 feet in the air.   Education: Team Work, Special educational needs teacher, Journalist, newspaper, Building control surveyor.    Education Outcome: Acknowledges understanding   Clinical Observations/Feedback: Patient arrived late to group session, upon arrival patient assumed role of observer. Patient made no contributions to group discussion, but appeared to actively listen as she maintained appropriate eye contact with speaker.   Marykay Lex Anayah Arvanitis, LRT/CTRS  Annjanette Wertenberger L 04/08/2013 8:17 AM

## 2013-04-08 NOTE — Discharge Summary (Signed)
Physician Discharge Summary Note  Patient:  [name] is an 57 y.o., female MRN:  [id number] DOB:  25-Apr-1956 Patient phone:  [phone] (home)  Patient address:   [address] [place] [address],   Date of Admission:  04/06/2013 Date of Discharge: 04/08/2013   Reason for Admission:  Worsening depression and cocaine abuse, opiate abuse  Discharge Diagnoses: Active Problems:   MDD (major depressive disorder)   Anxiety state, unspecified   Cocaine abuse  ROS  DSM5: DSM5:  Schizophrenia Disorders: none  Obsessive-Compulsive Disorders: none  Trauma-Stressor Disorders: none  Substance/Addictive Disorders: Cocaine use disorders  Depressive Disorders: Major Depressive Disorder - Severe (296.23)  AXIS I: Substance Induced Mood Disorder  AXIS II: Deferred  AXIS III:  Past Medical History   Diagnosis  Date   .  Asthma      past 10 years   .  Diabetes mellitus      past 5 years   .  Glaucoma      dx 06/2011-[facility]   .  Hypertension    .  Hyperlipidemia    .  Hyperthyroidism    .  History of TIA (transient ischemic attack)      01/06/2011, x2   .  Pancreatitis    .  Gout    .  Depression    .  Renal disorder      chronic kidney disease, stage 3   .  Chronic pain    .  Glaucoma    .  Arthritis      djd -lower back, left knee   .  Degenerative disc disease    .  Bipolar disorder    .  Gastritis    .  COPD (chronic obstructive pulmonary disease)    .  Schizophrenia    .  Personal history of colonic qdenomas  02/18/2013   .  Colon polyps      colonoscopy 01/2013   .  Fatty liver      mild CT 06/2012    AXIS IV: housing problems, problems related to legal system/crime and problems with primary support group  AXIS V: 41-50 serious symptoms   Level of Care:  In Patient medical admission  Hospital Course:  [name] is a 57 year old AAF who presented to the [facility] reporting suicidal ideation due to all the problems she has been having and increased  stress. She did not want to live any longer. She has multiple health problems as well as opiate addiction and cocaine abuse.  She has not been managing her health problems and is facing pending drug trafficking charges as well.     She was given medical clearance and transferred to [facility] for further care and stabilization. She was evaluated by the psychiatrist and medication management was initiated including a clonidine detox protocol.      With in her first 24 hours on the adult unit it was noted that her blood sugars continued to run in excess of 400. Providers were notified and Internal medicine was consulted. As conservative measures were not working it was agreed that the patient would be discharged from [facility] and directly admitted to [facility] [place] for further care and stabilization by Dr. Elisabeth Mathis.   Consults:  Internal Medicine                    Diabetic Nurse Educator  Significant Diagnostic Studies:  labs: CBGs  Discharge Vitals:   Blood pressure  128/82, pulse 84, temperature 97.7 F (36.5 C), temperature source Oral, resp. rate 16, height 5\' 2"  (1.575 m), weight 85.276 kg (188 lb). Body mass index is 34.38 kg/(m^2). Lab Results:   Results for orders placed during the hospital encounter of 04/06/13 (from the past 72 hour(s))  GLUCOSE, CAPILLARY     Status: Abnormal   Collection Time    04/06/13  5:41 PM      Result Value Range   Glucose-Capillary 349 (*) 70 - 99 mg/dL  TSH     Status: Abnormal   Collection Time    04/06/13  8:09 PM      Result Value Range   TSH 24.496 (*) 0.350 - 4.500 uIU/mL   Comment: Performed at [facility]  GLUCOSE, CAPILLARY     Status: Abnormal   Collection Time    04/06/13  9:53 PM      Result Value Range   Glucose-Capillary 314 (*) 70 - 99 mg/dL   Comment 1 Notify RN    GLUCOSE, CAPILLARY     Status: Abnormal   Collection Time    04/07/13  6:21 AM      Result Value Range   Glucose-Capillary 348 (*) 70 - 99 mg/dL  GLUCOSE, CAPILLARY     Status:  Abnormal   Collection Time    04/07/13 11:39 AM      Result Value Range   Glucose-Capillary 369 (*) 70 - 99 mg/dL  GLUCOSE, CAPILLARY     Status: Abnormal   Collection Time    04/07/13  4:55 PM      Result Value Range   Glucose-Capillary 465 (*) 70 - 99 mg/dL   Comment 1 Notify RN    GLUCOSE, CAPILLARY     Status: Abnormal   Collection Time    04/07/13  6:31 PM      Result Value Range   Glucose-Capillary 461 (*) 70 - 99 mg/dL  GLUCOSE, CAPILLARY     Status: Abnormal   Collection Time    04/07/13  9:11 PM      Result Value Range   Glucose-Capillary 437 (*) 70 - 99 mg/dL  GLUCOSE, CAPILLARY     Status: Abnormal   Collection Time    04/08/13  6:23 AM      Result Value Range   Glucose-Capillary 194 (*) 70 - 99 mg/dL  HEMOGLOBIN Z6X     Status: Abnormal   Collection Time    04/08/13  6:26 AM      Result Value Range   Hemoglobin A1C 12.4 (*) <5.7 %   Comment: (NOTE)                                                                               According to the ADA Clinical Practice Recommendations for 2011, when     HbA1c is used as a screening test:      >=6.5%   Diagnostic of Diabetes Mellitus               (if abnormal result is confirmed)     5.7-6.4%   Increased risk of developing Diabetes Mellitus     References:Diagnosis and Classification of Diabetes Mellitus,Diabetes  ZOXW,9604,54(UJWJX 1):S62-S69 and Standards of Medical Care in             Diabetes - 2011,Diabetes Care,2011,34 (Suppl 1):S11-S61.   Mean Plasma Glucose 309 (*) <117 mg/dL   Comment: Performed at [facility]  GLUCOSE, CAPILLARY     Status: Abnormal   Collection Time    04/08/13 11:47 AM      Result Value Range   Glucose-Capillary 513 (*) 70 - 99 mg/dL   Comment 1 Notify RN    GLUCOSE, CAPILLARY     Status: Abnormal   Collection Time    04/08/13 12:27 PM      Result Value Range   Glucose-Capillary 566 (*) 70 - 99 mg/dL   Comment 1 Notify RN    GLUCOSE, CAPILLARY     Status: Abnormal    Collection Time    04/08/13 12:59 PM      Result Value Range   Glucose-Capillary >600 (*) 70 - 99 mg/dL    Physical Findings: AIMS: Facial and Oral Movements Muscles of Facial Expression: None, normal Lips and Perioral Area: None, normal Jaw: None, normal Tongue: None, normal,Extremity Movements Upper (arms, wrists, hands, fingers): None, normal Lower (legs, knees, ankles, toes): None, normal, Trunk Movements Neck, shoulders, hips: None, normal, Overall Severity Severity of abnormal movements (highest score from questions above): None, normal Incapacitation due to abnormal movements: None, normal Patient's awareness of abnormal movements (rate only patient's report): No Awareness, Dental Status Current problems with teeth and/or dentures?: No Does patient usually wear dentures?: No  CIWA:  CIWA-Ar Total: 6 COWS:  COWS Total Score: 2  Psychiatric Specialty Exam: See Psychiatric Specialty Exam and Suicide Risk Assessment completed by Attending Physician prior to discharge.  Discharge destination:  Other:  In patient medical unit  Is patient on multiple antipsychotic therapies at discharge:  No   Has Patient had three or more failed trials of antipsychotic monotherapy by history:  No  Recommended Plan for Multiple Antipsychotic Therapies: NA  Discharge Orders   Future Orders Complete By Expires   Diet - low sodium heart healthy  As directed    Discharge instructions  As directed    Comments:     Patient to be [facility] by [facility] to [facility] to bed 1301.   Increase activity slowly  As directed        Medication List    STOP taking these medications       diazepam 5 MG tablet  Commonly known as:  VALIUM     nicotine 14 mg/24hr patch  Commonly known as:  NICODERM CQ - dosed in mg/24 hours     Oxycodone HCl 10 MG Tabs      TAKE these medications     Indication   albuterol 108 (90 BASE) MCG/ACT inhaler  Commonly known as:  PROVENTIL  HFA;VENTOLIN HFA  Inhale 2 puffs into the lungs every 6 (six) hours as needed for wheezing.      allopurinol 300 MG tablet  Commonly known as:  ZYLOPRIM  Take 300 mg by mouth daily.      amLODipine 5 MG tablet  Commonly known as:  NORVASC  Take 1 tablet (5 mg total) by mouth daily.   Indication:  High Blood Pressure     aspirin EC 81 MG tablet  Take 1 tablet (81 mg total) by mouth daily.      atorvastatin 80 MG tablet  Commonly known as:  LIPITOR  Take 1 tablet (  80 mg total) by mouth daily at 6 PM.   Indication:  Disease of the Heart and Blood Vessels     brimonidine 0.2 % ophthalmic solution  Commonly known as:  ALPHAGAN  Place 1 drop into both eyes 2 (two) times daily.      colchicine 0.6 MG tablet  Take 1 tablet (0.6 mg total) by mouth daily as needed (for gout flare-ups).      divalproex 500 MG 24 hr tablet  Commonly known as:  DEPAKOTE ER  Take 2 tablets (1,000 mg total) by mouth at bedtime. Bipolar disorder mood stabilization.   Indication:  Manic Phase of Manic-Depression     Fluticasone-Salmeterol 250-50 MCG/DOSE Aepb  Commonly known as:  ADVAIR DISKUS  Inhale 1 puff into the lungs 2 times daily at 12 noon and 4 pm.      gabapentin 100 MG capsule  Commonly known as:  NEURONTIN  Take 1 capsule (100 mg total) by mouth 3 (three) times daily. Neuropathic pain.   Indication:  Agitation, Neuropathic Pain     hydrOXYzine 25 MG tablet  Commonly known as:  ATARAX/VISTARIL  Take 1 tablet (25 mg total) by mouth every 6 (six) hours as needed for anxiety.      insulin aspart 100 UNIT/ML injection  Commonly known as:  novoLOG  Inject 4 Units into the skin 3 (three) times daily with meals.      insulin aspart 100 UNIT/ML injection  Commonly known as:  novoLOG  Inject 0-15 Units into the skin 3 (three) times daily with meals.      insulin aspart 100 UNIT/ML injection  Commonly known as:  novoLOG  Inject 0-5 Units into the skin at bedtime.      insulin glargine 100  UNIT/ML injection  Commonly known as:  LANTUS  Inject 20 Units into the skin at bedtime.      latanoprost 0.005 % ophthalmic solution  Commonly known as:  XALATAN  Place 1 drop into both eyes at bedtime.      levothyroxine 125 MCG tablet  Commonly known as:  SYNTHROID, LEVOTHROID  Take 2 tablets (250 mcg total) by mouth daily before breakfast.   Indication:  Underactive Thyroid     lisinopril 40 MG tablet  Commonly known as:  PRINIVIL,ZESTRIL  Take 40 mg by mouth daily.      LORazepam 1 MG tablet  Commonly known as:  ATIVAN  Take 1 tablet (1 mg total) by mouth every 8 (eight) hours as needed for anxiety (agitation).      nitroGLYCERIN 0.4 MG SL tablet  Commonly known as:  NITROSTAT  Place 1 tablet (0.4 mg total) under the tongue every 5 (five) minutes as needed for chest pain.      OLANZapine 10 MG tablet  Commonly known as:  ZYPREXA  Take 1 tablet (10 mg total) by mouth at bedtime. For bipolar disorder.   Indication:  Manic-Depression     pantoprazole 40 MG tablet  Commonly known as:  PROTONIX  Take 1 tablet (40 mg total) by mouth daily.      sertraline 50 MG tablet  Commonly known as:  ZOLOFT  Take 3 tablets (150 mg total) by mouth daily.      traZODone 100 MG tablet  Commonly known as:  DESYREL  Take 100 mg by mouth at bedtime.            Follow-up Information   Follow up with [facility] of Care On 04/15/2013. (Appointment scheduled at  12:30 pm with Ledon Snare for community support team intake assessment)    Contact information:   [address], [place], [place] [address] Phone: [phone] Fax: [phone]      Follow up with [facility] of Care On 04/15/2013. (Appointment scheduled at 1:30 pm with Dr. Midge Aver on this date for medication management.)    Contact information:   [address] [place], [place] [address] Phone: [phone] Fax: [phone]      Follow-up recommendations:   The clonidine protocol was  discontinued due to patient's blood sugars and transfer. We will be happy to restart this once she is medically more stable. Comments:  Thank you so much for your support and guidance in our care of the very nice patient. Will reassess after medically stable for possible readmission to [facility] Total Discharge Time:  Less than 30 minutes.  Signed: Rona Ravens. Mashburn RPAC 1:45 PM 04/08/2013  MASHBURN,NEIL 04/08/2013, 1:33 PM Agree with assessment and plan Madie Reno A. Dub Mikes, M.D.

## 2013-04-08 NOTE — Progress Notes (Signed)
Pt reports she feels the same as she did yesterday.  She is still wanting to get her valium and oxycodone given to her here.  Informed pt that decision would need to be made by her MD in the morning.  Pt has not been compliant with a diabetic diet and, as a result, her CBGs have been very high.  At HS, CBG was 437.  PA was informed who made changes to pt's insulin orders.  Lantus was increased to 26 units.  Bedtime coverage was also added.  Tonight PA ordered pt to receive 15 units of Novolog for the CBG of 437.  Reminded pt that she needed to consider her health when making choices of what to eat.  Pt voices understanding.  Pt denies HI/AV.  She says she has passive thoughts of self harm.  Pt makes her needs known to staff.  Support and encouragement offered.  Pt says she wants to go to [place] where her children live at discharge.  Safety maintained with q15 minute checks.

## 2013-04-08 NOTE — Tx Team (Signed)
Interdisciplinary Treatment Plan Update (Adult)  Date: 04/08/2013  Time Reviewed:  9:45 AM  Progress in Treatment: Attending groups: Yes Participating in groups:  Yes Taking medication as prescribed:  Yes Tolerating medication:  Yes Family/Significant othe contact made: Yes Patient understands diagnosis:  Yes Discussing patient identified problems/goals with staff:  Yes Medical problems stabilized or resolved:  Yes Denies suicidal/homicidal ideation: Yes Issues/concerns per patient self-inventory:  Yes Other:  New problem(s) identified: N/A  Discharge Plan or Barriers: CSW assessing for appropriate referrals.    Reason for Continuation of Hospitalization: Anxiety Depression Medication Stabilization  Comments: N/A  Estimated length of stay: 3-5 days  For review of initial/current patient goals, please see plan of care.  Attendees: Patient:     Family:     Physician:  Dr. Dub Mikes 04/08/2013 10:40 AM   Nursing:   Robbie Louis, RN 04/08/2013 10:40 AM   Clinical Social Worker:  Reyes Ivan, LCSW 04/08/2013 10:40 AM   Other: Onnie Boer, RN case manager 04/08/2013 10:40 AM   Other:  Trula Slade, LCSWA 04/08/2013 10:40 AM   Other:  Roswell Miners, RN 04/08/2013 10:40 AM   Other:  Tomasita Morrow, care coordination 04/08/2013 10:40 AM   Other:    Other:    Other:    Other:    Other:    Other:     Scribe for Treatment Team:   Carmina Miller, 04/08/2013 , 10:40 AM

## 2013-04-08 NOTE — Progress Notes (Signed)
Pt's CBG at noon was 513 pt was given 15 units of novolog and was checked 30 minutes later and CBG was 566 and again checked at 1300 which registered high > 600.  Received orders to send pt via 911 to [facility] room 1301.  Called report to Selena Batten the nurse receiving her.

## 2013-04-08 NOTE — Progress Notes (Signed)
Inpatient Diabetes Program Recommendations  AACE/ADA: New Consensus Statement on Inpatient Glycemic Control (2013)  Target Ranges:  Prepandial:   less than 140 mg/dL      Peak postprandial:   less than 180 mg/dL (1-2 hours)      Critically ill patients:  140 - 180 mg/dL   Reason for Visit: Results for VICIE, CECH (MRN [id number]) as of 04/08/2013 15:11  Ref. Range 04/08/2013 06:23 04/08/2013 06:26 04/08/2013 11:47 04/08/2013 12:27 04/08/2013 12:59  Glucose-Capillary Latest Range: 70-99 mg/dL 782 (H)  956 (H) 213 (HH) >600 (HH)   Note patient transferred from [facility] today due to CBG being greater than 600 mg/dL. Note that it appears patient had previously been on Lantus 20 units and Novolog meal coverage 4 units tid with meals prior to [facility] admission. It appears that patient was not on basal insulin at [facility]. Labs pending.  May require IV insulin?  Will follow.  Beryl Meager, RN, BC-ADM Inpatient Diabetes Coordinator Pager [phone]

## 2013-04-08 NOTE — H&P (Signed)
[facility] History and Physical  [name] ZOX:[id number] DOB: 1955-11-19 DOA: 04/08/2013  Referring physician: ER physician PCP: Windell Hummingbird, MD   Chief Complaint: Hypoglycemia  HPI:  57 year old female with multiple medical comorbidities including but not limited to diabetes, hypertension, major depressive disorder, schizophrenia, gout, COPD who was initially admitted to behavioral for management of severe depression. Patient had high CBGs in [facility] unable to control it with sliding scale insulin for which reason patient required transfer to [facility]. At this time patient has no complaints of chest pain, shortness of breath. No blurry visions or headaches. No abdominal pain, nausea or vomiting. She does have chronic back pain. Patient walked vitals are stable, blood pressure 113/69, heart rate 78 and T 97.3 F. we are awaiting the admission labs at this time.  Assessment and Plan:  Principal Problem:   Hyperglycemia - CBGs in the 500s with sliding scale insulin. Concern is that patient may be in DKA. We will obtain stat CMET, CBC, UA - Patient may require transfer to telemetry depending on requirement for insulin drip. Active Problems:   Hypothyroidism - Check TSH level - Continue levothyroxine   DM (diabetes mellitus), uncontrolled with complications of neuropathy - A1c in September 2014 was 11.6 indicating poor glycemic control - Obtain admission labs. Until then may use a sliding scale insulin - Continue IV fluids for now   Chronic back pain - Pain management with fentanyl low-dose IV as needed. Not on Dilaudid or oxycodone to 2 allergic reaction, hives   HTN (hypertension) - Continue Norvasc   Schizophrenia - Continue Zyprexa   Gout - Continue colchicine and allopurinol   Peripheral neuropathy - Continue gabapentin   Depression with anxiety - Continue Zoloft and Ativan - Sitter at bedside   Hyperlipidemia - Continue statin therapy   COPD  exacerbation - Stable, continue albuterol inhaler, Dulera  Code Status: Full Family Communication: No family at the bedside Disposition Plan: Admit for further evaluation  Manson Passey, MD  [facility] Pager [phone]  Review of Systems:  Constitutional: Negative for fever, chills and malaise/fatigue. Negative for diaphoresis.  HENT: Negative for hearing loss, ear pain, nosebleeds, congestion, sore throat, neck pain, tinnitus and ear discharge.   Eyes: Negative for blurred vision, double vision, photophobia, pain, discharge and redness.  Respiratory: Negative for cough, hemoptysis, sputum production, shortness of breath, wheezing and stridor.   Cardiovascular: Negative for chest pain, palpitations, orthopnea, claudication and leg swelling.  Gastrointestinal: Negative for nausea, vomiting and abdominal pain. Negative for heartburn, constipation, blood in stool and melena.  Genitourinary: Negative for dysuria, urgency, frequency, hematuria and flank pain.  Musculoskeletal: Negative for myalgias, positive for back pain, no joint pain and falls.  Skin: Negative for itching and rash.  Neurological: Negative for dizziness and weakness. Negative for tingling, tremors, sensory change, speech change, focal weakness, loss of consciousness and headaches.  Endo/Heme/Allergies: Negative for environmental allergies and polydipsia. Does not bruise/bleed easily.  Psychiatric/Behavioral: depressed.     Past Medical History  Diagnosis Date  . Asthma     past 10 years  . Diabetes mellitus     past 5 years  . Glaucoma     dx 06/2011-[facility]  . Hypertension   . Hyperlipidemia   . Hyperthyroidism   . History of TIA (transient ischemic attack)     01/06/2011, x2  . Pancreatitis   . Gout   . Depression   . Renal disorder     chronic kidney disease, stage 3  .  Chronic pain   . Glaucoma   . Arthritis     djd -lower back, left knee  . Degenerative disc disease   . Bipolar  disorder   . Gastritis   . COPD (chronic obstructive pulmonary disease)   . Schizophrenia   . 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 Colon polyps     colonoscopy 01/2013   . Fatty liver     mild CT 06/2012   Past Surgical History  Procedure Laterality Date  . Vaginal hysterectomy  1990  . Abdominal hysterectomy      still has ovaries    Social History:  reports that she has been smoking Cigarettes.  She has a .3 pack-year smoking history. She has never used smokeless tobacco. She reports that she uses illicit drugs (Cocaine). She reports that she does not drink alcohol.  Allergies  Allergen Reactions  . Hydrocodone-Acetaminophen Hives  . Metformin Diarrhea    Family History  Problem Relation Age of Onset  . Arthritis Mother   . Diabetes Mother   . Hypertension Mother   . Kidney disease Mother   . Heart disease Mother     died 52  . Heart disease      mother & father  . Hypertension      mother & father  . Colon cancer Neg Hx   . Prostate cancer Neg Hx   . Breast cancer Neg Hx   . Stomach cancer Neg Hx   . Hypertension Sister   . Hypertension Brother   . Kidney disease Brother      Prior to Admission medications   Medication Sig Start Date End Date Taking? Authorizing Provider  albuterol (PROVENTIL HFA;VENTOLIN HFA) 108 (90 BASE) MCG/ACT inhaler Inhale 2 puffs into the lungs every 6 (six) hours as needed for wheezing. 01/23/13   Dow Adolph, MD  allopurinol (ZYLOPRIM) 300 MG tablet Take 300 mg by mouth daily.    Historical Provider, MD  amLODipine (NORVASC) 5 MG tablet Take 1 tablet (5 mg total) by mouth daily. 04/08/13   Verne Spurr, PA-C  aspirin EC 81 MG tablet Take 1 tablet (81 mg total) by mouth daily. 02/25/13   Vivi Barrack, MD  atorvastatin (LIPITOR) 80 MG tablet Take 1 tablet (80 mg total) by mouth daily at 6 PM. 04/08/13   Verne Spurr, PA-C  brimonidine (ALPHAGAN) 0.2 % ophthalmic solution Place 1 drop into both eyes 2 (two) times daily.     Historical Provider, MD  colchicine 0.6 MG tablet Take 1 tablet (0.6 mg total) by mouth daily as needed (for gout flare-ups). 04/08/13   Verne Spurr, PA-C  divalproex (DEPAKOTE ER) 500 MG 24 hr tablet Take 2 tablets (1,000 mg total) by mouth at bedtime. Bipolar disorder mood stabilization. 04/08/13   Verne Spurr, PA-C  Fluticasone-Salmeterol (ADVAIR DISKUS) 250-50 MCG/DOSE AEPB Inhale 1 puff into the lungs 2 times daily at 12 noon and 4 pm. 04/08/13   Verne Spurr, PA-C  gabapentin (NEURONTIN) 100 MG capsule Take 1 capsule (100 mg total) by mouth 3 (three) times daily. Neuropathic pain. 04/08/13   Verne Spurr, PA-C  hydrOXYzine (ATARAX/VISTARIL) 25 MG tablet Take 1 tablet (25 mg total) by mouth every 6 (six) hours as needed for anxiety. 04/08/13   Verne Spurr, PA-C  insulin aspart (NOVOLOG) 100 UNIT/ML injection Inject 4 Units into the skin 3 (three) times daily with meals. 02/25/13   Vivi Barrack, MD  insulin aspart (NOVOLOG) 100 UNIT/ML injection Inject 0-15 Units into the  skin 3 (three) times daily with meals. 04/08/13   Verne Spurr, PA-C  insulin aspart (NOVOLOG) 100 UNIT/ML injection Inject 0-5 Units into the skin at bedtime. 04/08/13   Verne Spurr, PA-C  insulin glargine (LANTUS) 100 UNIT/ML injection Inject 20 Units into the skin at bedtime. 02/25/13   Vivi Barrack, MD  latanoprost (XALATAN) 0.005 % ophthalmic solution Place 1 drop into both eyes at bedtime. 01/23/13   Dow Adolph, MD  levothyroxine (SYNTHROID, LEVOTHROID) 125 MCG tablet Take 2 tablets (250 mcg total) by mouth daily before breakfast. 04/08/13   Verne Spurr, PA-C  lisinopril (PRINIVIL,ZESTRIL) 40 MG tablet Take 1 tablet (40 mg total) by mouth daily. 04/08/13   Verne Spurr, PA-C  LORazepam (ATIVAN) 1 MG tablet Take 1 tablet (1 mg total) by mouth every 8 (eight) hours as needed for anxiety (agitation). 04/08/13   Verne Spurr, PA-C  nitroGLYCERIN (NITROSTAT) 0.4 MG SL tablet Place 1 tablet (0.4 mg total) under the  tongue every 5 (five) minutes as needed for chest pain. 11/09/12   Lorretta Harp, MD  OLANZapine (ZYPREXA) 10 MG tablet Take 1 tablet (10 mg total) by mouth at bedtime. For bipolar disorder. 04/08/13   Verne Spurr, PA-C  pantoprazole (PROTONIX) 40 MG tablet Take 1 tablet (40 mg total) by mouth daily. 02/25/13   Vivi Barrack, MD  sertraline (ZOLOFT) 50 MG tablet Take 3 tablets (150 mg total) by mouth daily. For anxiety and depression. 04/08/13   Verne Spurr, PA-C  traZODone (DESYREL) 100 MG tablet Take 1 tablet (100 mg total) by mouth at bedtime. 04/08/13   Verne Spurr, PA-C   Physical Exam: Filed Vitals:   04/08/13 1403  BP: 113/69  Pulse: 78  Temp: 97.3 F (36.3 C)  TempSrc: Oral  Resp: 16  Height: 5\' 2"  (1.575 m)  Weight: 86.183 kg (190 lb)  SpO2: 99%    Physical Exam  Constitutional: Appears well-developed and well-nourished. No distress.  HENT: Normocephalic. External right and left ear normal. Oropharynx is clear and moist.  Eyes: Conjunctivae and EOM are normal. PERRLA, no scleral icterus.  Neck: Normal ROM. Neck supple. No JVD. No tracheal deviation. No thyromegaly.  CVS: RRR, S1/S2 appreciated Pulmonary: Effort and breath sounds normal, no stridor, rhonchi, wheezes, rales.  Abdominal: Soft. BS +,  no distension, tenderness, rebound or guarding.  Musculoskeletal: Normal range of motion. No tenderness.  Lymphadenopathy: No lymphadenopathy noted, cervical, inguinal. Neuro: Alert. Normal reflexes, muscle tone coordination. No cranial nerve deficit. Skin: Skin is warm and dry. No rash noted. Not diaphoretic. No erythema. No pallor.  Psychiatric: Normal mood and affect. Behavior, judgment, thought content normal.   Labs on Admission:  Basic Metabolic Panel:  Recent Labs Lab 04/05/13 1130  NA 133*  K 4.0  CL 96  CO2 22  GLUCOSE 417*  BUN 23  CREATININE 1.57*  CALCIUM 10.1   Liver Function Tests:  Recent Labs Lab 04/05/13 1130  AST 18  ALT 13  ALKPHOS 141*   BILITOT 0.3  PROT 8.0  ALBUMIN 4.1   No results found for this basename: LIPASE, AMYLASE,  in the last 168 hours No results found for this basename: AMMONIA,  in the last 168 hours CBC:  Recent Labs Lab 04/05/13 1130  WBC 7.1  NEUTROABS 4.4  HGB 14.3  HCT 41.7  MCV 84.4  PLT 334   Cardiac Enzymes: No results found for this basename: CKTOTAL, CKMB, CKMBINDEX, TROPONINI,  in the last 168 hours BNP: No components found with this basename: POCBNP,  CBG:  Recent Labs Lab 04/07/13 2111 04/08/13 0623 04/08/13 1147 04/08/13 1227 04/08/13 1259  GLUCAP 437* 194* 513* 566* >600*    If 7PM-7AM, please contact night-coverage [email or web address] Password [email or web address] 04/08/2013, 2:18 PM

## 2013-04-08 NOTE — BHH Group Notes (Signed)
[facility] LCSW Aftercare Discharge Planning Group Note   04/08/2013  8:45 AM  Participation Quality:  Did Not Attend - pt came when group was ending.  Pt requested CSW to fax a letter to her attorneys yesterday, letting them know she is in the hospital and CSW confirmed that this was sent yesterday.  Pt was interested in additional support, such as a community support team.  Pt has follow up scheduled with [employer] [facility] for community team support assessment and medication management.  No further needs voiced by pt at this time.    Maria Ivan, LCSW 04/08/2013 9:28 AM

## 2013-04-08 NOTE — Progress Notes (Signed)
[facility] MD Progress Note  04/08/2013 7:36 PM [name]  MRN:  [id number] Subjective:  [name] had continued to endorse not feeling well, felt down, depressed, anxious Diagnosis:   DSM5: Schizophrenia Disorders:   Obsessive-Compulsive Disorders:   Trauma-Stressor Disorders:   Substance/Addictive Disorders:  Cocaine use disorders Depressive Disorders:  Major Depressive Disorder - Moderate (296.22)  Axis I: Anxiety Disorder NOS  ADL's:  Intact  Sleep: Poor  Appetite:  Poor  Suicidal Ideation:  Plan:  denies Intent:  denies Means:  denies Homicidal Ideation:  Plan:  denies Intent:  denies Means:  denies AEB (as evidenced by):  Psychiatric Specialty Exam: Review of Systems  Constitutional: Positive for malaise/fatigue.  HENT: Negative.   Eyes: Negative.   Respiratory: Negative.   Cardiovascular: Negative.   Gastrointestinal: Negative.   Genitourinary: Negative.   Musculoskeletal: Positive for back pain.  Skin: Negative.   Neurological: Positive for weakness.  Endo/Heme/Allergies: Negative.   Psychiatric/Behavioral: Positive for depression and substance abuse. The patient is nervous/anxious and has insomnia.     Blood pressure 128/82, pulse 84, temperature 97.7 F (36.5 C), temperature source Oral, resp. rate 16, height 5\' 2"  (1.575 m), weight 85.276 kg (188 lb).Body mass index is 34.38 kg/(m^2).  General Appearance: Fairly Groomed  Patent attorney::  Fair  Speech:  Clear and Coherent and Slow  Volume:  decreased  Mood:  Anxious and Depressed  Affect:  Restricted  Thought Process:  Coherent and Goal Directed  Orientation:  Full (Time, Place, and Person)  Thought Content:  somatically focused, worries, concerns  Suicidal Thoughts:  Yes.  without intent/plan  Homicidal Thoughts:  No  Memory:  Immediate;   Fair Recent;   Fair Remote;   Fair  Judgement:  Fair  Insight:  Shallow  Psychomotor Activity:  Restlessness  Concentration:  Fair  Recall:  Fair  Akathisia:  No   Handed:    AIMS (if indicated):     Assets:  Desire for Improvement  Sleep:  Number of Hours: 6.75   Current Medications: No current facility-administered medications for this encounter.   No current outpatient prescriptions on file.   Facility-Administered Medications Ordered in Other Encounters  Medication Dose Route Frequency Provider Last Rate Last Dose  . 0.9 %  sodium chloride infusion   Intravenous Continuous Alison Murray, MD 100 mL/hr at 04/08/13 1713    . acetaminophen (TYLENOL) tablet 650 mg  650 mg Oral Q6H PRN Alison Murray, MD       Or  . acetaminophen (TYLENOL) suppository 650 mg  650 mg Rectal Q6H PRN Alison Murray, MD      . albuterol (PROVENTIL HFA;VENTOLIN HFA) 108 (90 BASE) MCG/ACT inhaler 2 puff  2 puff Inhalation Q6H PRN Alison Murray, MD      . Melene Muller ON 04/09/2013] allopurinol (ZYLOPRIM) tablet 300 mg  300 mg Oral Daily Alison Murray, MD      . Melene Muller ON 04/09/2013] amLODipine (NORVASC) tablet 5 mg  5 mg Oral Daily Alison Murray, MD      . Melene Muller ON 04/09/2013] aspirin EC tablet 81 mg  81 mg Oral Daily Alison Murray, MD      . Melene Muller ON 04/09/2013] atorvastatin (LIPITOR) tablet 80 mg  80 mg Oral q1800 Alison Murray, MD      . brimonidine (ALPHAGAN) 0.2 % ophthalmic solution 1 drop  1 drop Both Eyes BID Alison Murray, MD      . colchicine tablet 0.6 mg  0.6 mg Oral Daily PRN Alison Murray, MD      . divalproex (DEPAKOTE ER) 24 hr tablet 1,000 mg  1,000 mg Oral QHS Alison Murray, MD      . fentaNYL (SUBLIMAZE) injection 12.5 mcg  12.5 mcg Intravenous Q4H PRN Alison Murray, MD      . gabapentin (NEURONTIN) capsule 100 mg  100 mg Oral TID Alison Murray, MD   100 mg at 04/08/13 1650  . hydrOXYzine (ATARAX/VISTARIL) tablet 25 mg  25 mg Oral Q6H PRN Alison Murray, MD      . insulin aspart (novoLOG) injection 0-20 Units  0-20 Units Subcutaneous TID WC Alison Murray, MD   11 Units at 04/08/13 1710  . insulin aspart (novoLOG) injection 0-5 Units  0-5 Units Subcutaneous QHS  Alison Murray, MD      . insulin glargine (LANTUS) injection 26 Units  26 Units Subcutaneous QHS Alison Murray, MD      . latanoprost (XALATAN) 0.005 % ophthalmic solution 1 drop  1 drop Both Eyes QHS Alison Murray, MD      . Melene Muller ON 04/09/2013] levothyroxine (SYNTHROID, LEVOTHROID) tablet 250 mcg  250 mcg Oral QAC breakfast Alison Murray, MD      . LORazepam (ATIVAN) tablet 1 mg  1 mg Oral Q8H PRN Alison Murray, MD      . mometasone-formoterol [facility]) 100-5 MCG/ACT inhaler 2 puff  2 puff Inhalation BID Alison Murray, MD   2 puff at 04/08/13 1934  . OLANZapine (ZYPREXA) tablet 10 mg  10 mg Oral QHS Alison Murray, MD      . ondansetron [facility]) tablet 4 mg  4 mg Oral Q6H PRN Alison Murray, MD       Or  . ondansetron [facility]) injection 4 mg  4 mg Intravenous Q6H PRN Alison Murray, MD      . oxyCODONE (Oxy IR/ROXICODONE) immediate release tablet 5 mg  5 mg Oral Q3H PRN Alison Murray, MD   5 mg at 04/08/13 1650  . [START ON 04/09/2013] pantoprazole (PROTONIX) EC tablet 40 mg  40 mg Oral Daily Alison Murray, MD      . Melene Muller ON 04/09/2013] sertraline (ZOLOFT) tablet 150 mg  150 mg Oral Daily Alison Murray, MD      . traZODone (DESYREL) tablet 100 mg  100 mg Oral QHS Alison Murray, MD        Lab Results:  Results for orders placed during the hospital encounter of 04/06/13 (from the past 48 hour(s))  TSH     Status: Abnormal   Collection Time    04/06/13  8:09 PM      Result Value Range   TSH 24.496 (*) 0.350 - 4.500 uIU/mL   Comment: Performed at [facility]  GLUCOSE, CAPILLARY     Status: Abnormal   Collection Time    04/06/13  9:53 PM      Result Value Range   Glucose-Capillary 314 (*) 70 - 99 mg/dL   Comment 1 Notify RN    GLUCOSE, CAPILLARY     Status: Abnormal   Collection Time    04/07/13  6:21 AM      Result Value Range   Glucose-Capillary 348 (*) 70 - 99 mg/dL  GLUCOSE, CAPILLARY     Status: Abnormal   Collection Time    04/07/13 11:39 AM      Result Value Range  Glucose-Capillary 369 (*) 70 - 99 mg/dL  GLUCOSE, CAPILLARY     Status: Abnormal   Collection Time    04/07/13  4:55 PM      Result Value Range   Glucose-Capillary 465 (*) 70 - 99 mg/dL   Comment 1 Notify RN    GLUCOSE, CAPILLARY     Status: Abnormal   Collection Time    04/07/13  6:31 PM      Result Value Range   Glucose-Capillary 461 (*) 70 - 99 mg/dL  GLUCOSE, CAPILLARY     Status: Abnormal   Collection Time    04/07/13  9:11 PM      Result Value Range   Glucose-Capillary 437 (*) 70 - 99 mg/dL  GLUCOSE, CAPILLARY     Status: Abnormal   Collection Time    04/08/13  6:23 AM      Result Value Range   Glucose-Capillary 194 (*) 70 - 99 mg/dL  HEMOGLOBIN W4X     Status: Abnormal   Collection Time    04/08/13  6:26 AM      Result Value Range   Hemoglobin A1C 12.4 (*) <5.7 %   Comment: (NOTE)                                                                               According to the ADA Clinical Practice Recommendations for 2011, when     HbA1c is used as a screening test:      >=6.5%   Diagnostic of Diabetes Mellitus               (if abnormal result is confirmed)     5.7-6.4%   Increased risk of developing Diabetes Mellitus     References:Diagnosis and Classification of Diabetes Mellitus,Diabetes     Care,2011,34(Suppl 1):S62-S69 and Standards of Medical Care in             Diabetes - 2011,Diabetes Care,2011,34 (Suppl 1):S11-S61.   Mean Plasma Glucose 309 (*) <117 mg/dL   Comment: Performed at [facility]  GLUCOSE, CAPILLARY     Status: Abnormal   Collection Time    04/08/13 11:47 AM      Result Value Range   Glucose-Capillary 513 (*) 70 - 99 mg/dL   Comment 1 Notify RN    GLUCOSE, CAPILLARY     Status: Abnormal   Collection Time    04/08/13 12:27 PM      Result Value Range   Glucose-Capillary 566 (*) 70 - 99 mg/dL   Comment 1 Notify RN    GLUCOSE, CAPILLARY     Status: Abnormal   Collection Time    04/08/13 12:59 PM      Result Value Range    Glucose-Capillary >600 (*) 70 - 99 mg/dL    Physical Findings: AIMS: Facial and Oral Movements Muscles of Facial Expression: None, normal Lips and Perioral Area: None, normal Jaw: None, normal Tongue: None, normal,Extremity Movements Upper (arms, wrists, hands, fingers): None, normal Lower (legs, knees, ankles, toes): None, normal, Trunk Movements Neck, shoulders, hips: None, normal, Overall Severity Severity of abnormal movements (highest score from questions above): None, normal Incapacitation due to abnormal movements: None, normal Patient's awareness  of abnormal movements (rate only patient's report): No Awareness, Dental Status Current problems with teeth and/or dentures?: No Does patient usually wear dentures?: No  CIWA:  CIWA-Ar Total: 6 COWS:  COWS Total Score: 2  Treatment Plan Summary: Daily contact with patient to assess and evaluate symptoms and progress in treatment Medication management  Plan: Supportive approach/coping skills/relapse prevention           CBT/Mindfulness           Note: blood sugar was not registered by the machine, she was referred to the ED for evaluation and treatment Medical Decision Making Problem Points:  Review of psycho-social stressors (1) Data Points:  Review of medication regiment & side effects (2)  I certify that inpatient services furnished can reasonably be expected to improve the patient's condition.   [name] A 04/08/2013, 7:36 PM

## 2013-04-08 NOTE — Telephone Encounter (Signed)
Note that patient currently admitted. Will follow up as appropriate

## 2013-04-09 ENCOUNTER — Encounter (HOSPITAL_COMMUNITY): Payer: Self-pay | Admitting: Behavioral Health

## 2013-04-09 ENCOUNTER — Inpatient Hospital Stay (HOSPITAL_COMMUNITY)
Admission: AD | Admit: 2013-04-09 | Discharge: 2013-04-13 | Disposition: A | Discharge status: Other Acute Inpatient Hospital | Payer: 59 | Source: Intra-hospital | Attending: Psychiatry | Admitting: Psychiatry

## 2013-04-09 DIAGNOSIS — G8929 Other chronic pain: Secondary | ICD-10-CM

## 2013-04-09 DIAGNOSIS — M109 Gout, unspecified: Secondary | ICD-10-CM

## 2013-04-09 DIAGNOSIS — F332 Major depressive disorder, recurrent severe without psychotic features: Secondary | ICD-10-CM | POA: Diagnosis present

## 2013-04-09 DIAGNOSIS — Z79899 Other long term (current) drug therapy: Secondary | ICD-10-CM

## 2013-04-09 DIAGNOSIS — K219 Gastro-esophageal reflux disease without esophagitis: Secondary | ICD-10-CM

## 2013-04-09 DIAGNOSIS — F259 Schizoaffective disorder, unspecified: Secondary | ICD-10-CM | POA: Diagnosis present

## 2013-04-09 DIAGNOSIS — F339 Major depressive disorder, recurrent, unspecified: Secondary | ICD-10-CM

## 2013-04-09 DIAGNOSIS — J441 Chronic obstructive pulmonary disease with (acute) exacerbation: Secondary | ICD-10-CM

## 2013-04-09 DIAGNOSIS — I129 Hypertensive chronic kidney disease with stage 1 through stage 4 chronic kidney disease, or unspecified chronic kidney disease: Secondary | ICD-10-CM | POA: Diagnosis present

## 2013-04-09 DIAGNOSIS — F329 Major depressive disorder, single episode, unspecified: Secondary | ICD-10-CM

## 2013-04-09 DIAGNOSIS — F1410 Cocaine abuse, uncomplicated: Secondary | ICD-10-CM | POA: Diagnosis present

## 2013-04-09 DIAGNOSIS — N183 Chronic kidney disease, stage 3 unspecified: Secondary | ICD-10-CM | POA: Diagnosis present

## 2013-04-09 DIAGNOSIS — F341 Dysthymic disorder: Secondary | ICD-10-CM

## 2013-04-09 DIAGNOSIS — R739 Hyperglycemia, unspecified: Secondary | ICD-10-CM

## 2013-04-09 DIAGNOSIS — H04123 Dry eye syndrome of bilateral lacrimal glands: Secondary | ICD-10-CM

## 2013-04-09 DIAGNOSIS — E785 Hyperlipidemia, unspecified: Secondary | ICD-10-CM

## 2013-04-09 DIAGNOSIS — I10 Essential (primary) hypertension: Secondary | ICD-10-CM

## 2013-04-09 DIAGNOSIS — R45851 Suicidal ideations: Secondary | ICD-10-CM

## 2013-04-09 DIAGNOSIS — G629 Polyneuropathy, unspecified: Secondary | ICD-10-CM

## 2013-04-09 DIAGNOSIS — F418 Other specified anxiety disorders: Secondary | ICD-10-CM

## 2013-04-09 DIAGNOSIS — E039 Hypothyroidism, unspecified: Secondary | ICD-10-CM

## 2013-04-09 DIAGNOSIS — E119 Type 2 diabetes mellitus without complications: Secondary | ICD-10-CM

## 2013-04-09 DIAGNOSIS — J45909 Unspecified asthma, uncomplicated: Secondary | ICD-10-CM | POA: Diagnosis present

## 2013-04-09 DIAGNOSIS — F209 Schizophrenia, unspecified: Secondary | ICD-10-CM

## 2013-04-09 LAB — GLUCOSE, CAPILLARY
Glucose-Capillary: 178 mg/dL — ABNORMAL HIGH (ref 70–99)
Glucose-Capillary: 157 mg/dL — ABNORMAL HIGH (ref 70–99)
Glucose-Capillary: 310 mg/dL — ABNORMAL HIGH (ref 70–99)
Glucose-Capillary: 229 mg/dL — ABNORMAL HIGH (ref 70–99)

## 2013-04-09 LAB — COMPREHENSIVE METABOLIC PANEL
Albumin: 3.3 g/dL — ABNORMAL LOW (ref 3.5–5.2)
Alkaline Phosphatase: 110 U/L (ref 39–117)
BUN: 19 mg/dL (ref 6–23)
CO2: 23 mEq/L (ref 19–32)
Chloride: 97 mEq/L (ref 96–112)
Creatinine, Ser: 1.18 mg/dL — ABNORMAL HIGH (ref 0.50–1.10)
GFR calc Af Amer: 59 mL/min — ABNORMAL LOW (ref >90)
GFR calc non Af Amer: 51 mL/min — ABNORMAL LOW (ref >90)
Glucose, Bld: 269 mg/dL — ABNORMAL HIGH (ref 70–99)
Potassium: 4.3 mEq/L (ref 3.5–5.1)
Total Bilirubin: 0.2 mg/dL — ABNORMAL LOW (ref 0.3–1.2)

## 2013-04-09 LAB — CBC
MCV: 86.1 fL (ref 78.0–100.0)
Platelets: 244 10*3/uL (ref 150–400)
RBC: 4.04 MIL/uL (ref 3.87–5.11)
RDW: 15.9 % — ABNORMAL HIGH (ref 11.5–15.5)
WBC: 6.6 10*3/uL (ref 4.0–10.5)

## 2013-04-09 MED ORDER — COLCHICINE 0.6 MG PO TABS
0.6000 mg | ORAL_TABLET | Freq: Every day | ORAL | Status: DC | PRN
  Filled 2013-04-09: qty 1

## 2013-04-09 MED ORDER — INSULIN GLARGINE 100 UNIT/ML ~~LOC~~ SOLN
26.0000 [IU] | Freq: Every day | SUBCUTANEOUS | Status: DC
  Administered 2013-04-09: 26 [IU] via SUBCUTANEOUS

## 2013-04-09 MED ORDER — TRAZODONE HCL 100 MG PO TABS
100.0000 mg | ORAL_TABLET | Freq: Every day | ORAL | Status: DC
  Administered 2013-04-09 – 2013-04-12 (×4): 100 mg via ORAL
  Filled 2013-04-09 (×7): qty 1

## 2013-04-09 MED ORDER — HYDROXYZINE HCL 25 MG PO TABS
25.0000 mg | ORAL_TABLET | Freq: Four times a day (QID) | ORAL | Status: DC | PRN
  Administered 2013-04-10 – 2013-04-12 (×6): 25 mg via ORAL
  Filled 2013-04-09 (×6): qty 1

## 2013-04-09 MED ORDER — INSULIN ASPART 100 UNIT/ML ~~LOC~~ SOLN
0.0000 [IU] | Freq: Three times a day (TID) | SUBCUTANEOUS | Status: DC
  Administered 2013-04-10: 7 [IU] via SUBCUTANEOUS
  Administered 2013-04-10: 4 [IU] via SUBCUTANEOUS
  Administered 2013-04-11: 7 [IU] via SUBCUTANEOUS
  Administered 2013-04-11: 20 [IU] via SUBCUTANEOUS
  Administered 2013-04-12: 4 [IU] via SUBCUTANEOUS
  Administered 2013-04-12 (×2): 20 [IU] via SUBCUTANEOUS
  Administered 2013-04-13: 4 [IU] via SUBCUTANEOUS
  Administered 2013-04-13: 20 [IU] via SUBCUTANEOUS

## 2013-04-09 MED ORDER — INSULIN ASPART 100 UNIT/ML ~~LOC~~ SOLN: 0.0000 [IU] | Freq: Every day | SUBCUTANEOUS | Status: DC

## 2013-04-09 MED ORDER — MOMETASONE FURO-FORMOTEROL FUM 100-5 MCG/ACT IN AERO
2.0000 | INHALATION_SPRAY | Freq: Two times a day (BID) | RESPIRATORY_TRACT | Status: DC
  Administered 2013-04-10 – 2013-04-13 (×7): 2 via RESPIRATORY_TRACT
  Filled 2013-04-09: qty 8.8

## 2013-04-09 MED ORDER — OXYCODONE HCL 5 MG PO TABS: 5.0000 mg | ORAL_TABLET | ORAL | Status: DC | PRN

## 2013-04-09 MED ORDER — ATORVASTATIN CALCIUM 80 MG PO TABS
80.0000 mg | ORAL_TABLET | Freq: Every day | ORAL | Status: DC
  Administered 2013-04-10 – 2013-04-12 (×3): 80 mg via ORAL
  Filled 2013-04-09 (×5): qty 1

## 2013-04-09 MED ORDER — ASPIRIN EC 81 MG PO TBEC
81.0000 mg | DELAYED_RELEASE_TABLET | Freq: Every day | ORAL | Status: DC
  Administered 2013-04-10 – 2013-04-13 (×4): 81 mg via ORAL
  Filled 2013-04-09 (×6): qty 1

## 2013-04-09 MED ORDER — PANTOPRAZOLE SODIUM 40 MG PO TBEC
40.0000 mg | DELAYED_RELEASE_TABLET | Freq: Every day | ORAL | Status: DC
  Administered 2013-04-10 – 2013-04-13 (×4): 40 mg via ORAL
  Filled 2013-04-09 (×7): qty 1

## 2013-04-09 MED ORDER — ALLOPURINOL 300 MG PO TABS
300.0000 mg | ORAL_TABLET | Freq: Every day | ORAL | Status: DC
  Administered 2013-04-10 – 2013-04-13 (×4): 300 mg via ORAL
  Filled 2013-04-09 (×6): qty 1

## 2013-04-09 MED ORDER — ONDANSETRON 4 MG PO TBDP: 4.0000 mg | ORAL_TABLET | Freq: Three times a day (TID) | ORAL | Status: DC | PRN

## 2013-04-09 MED ORDER — LEVOTHYROXINE SODIUM 125 MCG PO TABS
250.0000 ug | ORAL_TABLET | Freq: Every day | ORAL | Status: DC
  Administered 2013-04-10: 250 ug via ORAL
  Filled 2013-04-09 (×3): qty 2

## 2013-04-09 MED ORDER — SERTRALINE HCL 50 MG PO TABS
150.0000 mg | ORAL_TABLET | Freq: Every day | ORAL | Status: DC
  Administered 2013-04-10 – 2013-04-13 (×4): 150 mg via ORAL
  Filled 2013-04-09 (×6): qty 1

## 2013-04-09 MED ORDER — INSULIN ASPART 100 UNIT/ML ~~LOC~~ SOLN: 0.0000 [IU] | Freq: Three times a day (TID) | SUBCUTANEOUS | Status: DC

## 2013-04-09 MED ORDER — MAGNESIUM HYDROXIDE 400 MG/5ML PO SUSP: 30.0000 mL | Freq: Every day | ORAL | Status: DC | PRN

## 2013-04-09 MED ORDER — ALUM & MAG HYDROXIDE-SIMETH 200-200-20 MG/5ML PO SUSP: 30.0000 mL | ORAL | Status: DC | PRN

## 2013-04-09 MED ORDER — AMLODIPINE BESYLATE 5 MG PO TABS
5.0000 mg | ORAL_TABLET | Freq: Every day | ORAL | Status: DC
  Administered 2013-04-10 – 2013-04-13 (×4): 5 mg via ORAL
  Filled 2013-04-09 (×6): qty 1

## 2013-04-09 MED ORDER — HALOPERIDOL 5 MG PO TABS
5.0000 mg | ORAL_TABLET | Freq: Every day | ORAL | Status: DC
  Administered 2013-04-09 – 2013-04-12 (×4): 5 mg via ORAL
  Filled 2013-04-09 (×8): qty 1

## 2013-04-09 MED ORDER — LEVOTHYROXINE SODIUM 300 MCG PO TABS: 300.0000 ug | ORAL_TABLET | Freq: Every day | ORAL | Status: DC

## 2013-04-09 MED ORDER — ACETAMINOPHEN 325 MG PO TABS
650.0000 mg | ORAL_TABLET | Freq: Four times a day (QID) | ORAL | Status: DC | PRN
  Administered 2013-04-11 – 2013-04-12 (×2): 650 mg via ORAL
  Filled 2013-04-09 (×2): qty 2

## 2013-04-09 MED ORDER — GABAPENTIN 100 MG PO CAPS
100.0000 mg | ORAL_CAPSULE | Freq: Three times a day (TID) | ORAL | Status: DC
  Administered 2013-04-10: 100 mg via ORAL
  Filled 2013-04-09 (×4): qty 1

## 2013-04-09 MED ORDER — ALBUTEROL SULFATE HFA 108 (90 BASE) MCG/ACT IN AERS: 2.0000 | INHALATION_SPRAY | Freq: Four times a day (QID) | RESPIRATORY_TRACT | Status: DC | PRN

## 2013-04-09 MED ORDER — BRIMONIDINE TARTRATE 0.2 % OP SOLN
1.0000 [drp] | Freq: Two times a day (BID) | OPHTHALMIC | Status: DC
  Administered 2013-04-10 – 2013-04-13 (×7): 1 [drp] via OPHTHALMIC
  Filled 2013-04-09: qty 5

## 2013-04-09 MED ORDER — INSULIN GLARGINE 100 UNIT/ML ~~LOC~~ SOLN: 26.0000 [IU] | Freq: Every day | SUBCUTANEOUS | Status: DC

## 2013-04-09 MED ORDER — INSULIN ASPART 100 UNIT/ML ~~LOC~~ SOLN
0.0000 [IU] | Freq: Every day | SUBCUTANEOUS | Status: DC
  Administered 2013-04-09: 3 [IU] via SUBCUTANEOUS
  Administered 2013-04-11: 4 [IU] via SUBCUTANEOUS
  Administered 2013-04-12: 2 [IU] via SUBCUTANEOUS

## 2013-04-09 MED ORDER — INSULIN ASPART 100 UNIT/ML ~~LOC~~ SOLN: 4.0000 [IU] | Freq: Three times a day (TID) | SUBCUTANEOUS | Status: DC

## 2013-04-09 NOTE — Progress Notes (Signed)
D:  Pt endorses passive SI, but contracts for safety Pt denies HI/AV. Pt is pleasant and cooperative.   A: Pt was offered support and encouragement. Pt was given scheduled medications. Pt was encourage to attend groups. Q 15 minute checks were done for safety.   R:Pt attends groups and interacts well with peers and staff. Pt is taking medication.Pt receptive to treatment and safety maintained on unit.

## 2013-04-09 NOTE — Progress Notes (Signed)
Report called to [place], RN @ [facility]

## 2013-04-09 NOTE — Tx Team (Signed)
Initial Interdisciplinary Treatment Plan  PATIENT STRENGTHS: (choose at least two) Ability for insight Active sense of humor Communication skills General fund of knowledge Supportive family/friends  PATIENT STRESSORS: Financial difficulties Health problems Marital or family conflict Medication change or noncompliance Substance abuse   PROBLEM LIST: Problem List/Patient Goals Date to be addressed Date deferred Reason deferred Estimated date of resolution  "I want to work on my self esteem." 04/09/13     Depression 04/09/13     Substance Abuse 04/09/13     Anxiety 04/09/13                                    DISCHARGE CRITERIA:  Ability to meet basic life and health needs Improved stabilization in mood, thinking, and/or behavior Medical problems require only outpatient monitoring Need for constant or close observation no longer present Verbal commitment to aftercare and medication compliance  PRELIMINARY DISCHARGE PLAN: Attend aftercare/continuing care group Attend 12-step recovery group Placement in alternative living arrangements  PATIENT/FAMIILY INVOLVEMENT: This treatment plan has been presented to and reviewed with the patient, [name].  The patient and family have been given the opportunity to ask questions and make suggestions.  [name] 04/09/2013, 9:03 PM

## 2013-04-09 NOTE — Discharge Summary (Signed)
Physician Discharge Summary  [name] NWG:[id number] DOB: 03/27/1956 DOA: 04/08/2013  PCP: Windell Hummingbird, MD  Admit date: 04/08/2013 Discharge date: 04/09/2013  Recommendations for Outpatient Follow-up:  1. A1c on this admission is 13.6 compared with 01/2013 A1c of 11.6. Recommendation is to increase Lantus to 26 units at bedtime and added novolog 4 units TID with meals. Also use sliding scale insulin. Please adjust insulin regimen if CBG's are in lower range. During this hospital stay CBG were in high 170's or mid 200's range. 2. Stop lisinopril due to renal insufficiency. We only started Norvasc during this hospital stay and BP was on lower side 110/54. Norvasc is 5 mg so it can be increased to 10 mg a day if needed for better BP control. 3. Please note we have increased levothyroxine to 300 mcg daily instead of 250 mcg daily as TSH was high at 12.  Discharge Diagnoses:  Principal Problem:   Hyperglycemia Active Problems:   Hypothyroidism   DM (diabetes mellitus)   Chronic back pain   HTN (hypertension)   Schizophrenia   Gout   Peripheral neuropathy   Depression with anxiety   Hyperlipidemia   COPD exacerbation    Discharge Condition: medically stable to discharge to [facility] today  Diet recommendation: carb modified, low sodium diet  History of present illness:  57 year old female with multiple medical comorbidities including but not limited to diabetes, hypertension, major depressive disorder, schizophrenia, gout, COPD who was initially admitted to behavioral for management of severe depression. Patient had high CBGs in Behavioral Health unable to control it with sliding scale insulin for which reason patient required transfer to [facility].   Assessment and Plan:   Principal Problem:  Hyperglycemia  - initial blood work revealed hyperglycemia without DKA or hyperosmolar state  - CBG's in past 24 hours are 280, 234 but the last one is at 9 pm yesterday; need more values to  determine the right lantus and meal coverage dose  - now on lantus 26 units at bedtime; will add novolog 4 units TIDAC  - A1c on this admission is 13.6 compared to 01/2013 when it was 11.6  Active Problems:  Hypothyroidism  - Check TSH level - 12.047  - Continue levothyroxine but increase the dose to 300 mcg a day DM (diabetes mellitus), uncontrolled with complications of neuropathy  - A1c in September 2014 was 11.6 indicating poor glycemic control; now 13.6  - appreciate diabetic coordinator input  Chronic back pain  - Pain management with oxycodone per patient request; it does appear she has allergic reaction to this type of medications but she says she was able to take this medication before  HTN (hypertension)  - Continue Norvasc  - BP 110//54 this am  - hold lisinopril due to renal insufficiency Schizophrenia  - Continue Zyprexa  Gout  - Continue colchicine and allopurinol  Peripheral neuropathy  - Continue gabapentin  Depression with anxiety  - Continue Zoloft and Ativan  - Sitter at bedside  Hyperlipidemia  - Continue statin therapy  COPD exacerbation  - Stable, continue albuterol inhaler, Dulera   Code Status: Full  Family Communication: No family at the bedside  Disposition Plan: transfer to [facility] today    Consultants:  Psych for transfer to [facility] Procedures:  None  Antibiotics:  None    Signed:  Manson Passey, MD  [facility] 04/09/2013, 9:43 AM  Pager #: [phone]   Discharge Exam: Filed Vitals:   04/09/13 0600  BP: 110/54  Pulse: 80  Temp: 98.6 F (37 C)  Resp: 20   Filed Vitals:   04/08/13 1403 04/08/13 2122 04/09/13 0600 04/09/13 0813  BP: 113/69 125/70 110/54   Pulse: 78 80 80   Temp: 97.3 F (36.3 C) 98.3 F (36.8 C) 98.6 F (37 C)   TempSrc: Oral Oral Oral   Resp: 16 20 20    Height: 5\' 2"  (1.575 m)     Weight: 86.183 kg (190 lb)     SpO2: 99% 97% 96% 92%    General: Pt is alert, follows commands appropriately, not in  acute distress Cardiovascular: Regular rate and rhythm, S1/S2 +, no murmurs, no rubs, no gallops Respiratory: Clear to auscultation bilaterally, no wheezing, no crackles, no rhonchi Abdominal: Soft, non tender, non distended, bowel sounds +, no guarding Extremities: no edema, no cyanosis, pulses palpable bilaterally DP and PT Neuro: Grossly nonfocal  Discharge Instructions  Discharge Orders   Future Orders Complete By Expires   Call MD for:  difficulty breathing, headache or visual disturbances  As directed    Call MD for:  persistant dizziness or light-headedness  As directed    Call MD for:  persistant nausea and vomiting  As directed    Call MD for:  severe uncontrolled pain  As directed    Diet - low sodium heart healthy  As directed    Diet Carb Modified  As directed    Discharge instructions  As directed    Comments:     1. A1c on this admission is 13.6 compared with 01/2013 A1c of 11.6. Recommendation is to increase Lantus to 26 units at bedtime and added novolog 4 units TID with meals. Also use sliding scale insulin. Please adjust insulin regimen if CBG's are in lower range. During this hospital stay CBG were in high 170's or mid 200's range. 2. Stop lisinopril due to renal insufficiency. We only started Norvasc during this hospital stay and BP was on lower side 110/54. Norvasc is 5 mg so it can be increased to 10 mg a day if needed for better BP control.   Increase activity slowly  As directed        Medication List    STOP taking these medications       lisinopril 40 MG tablet  Commonly known as:  PRINIVIL,ZESTRIL      TAKE these medications       albuterol 108 (90 BASE) MCG/ACT inhaler  Commonly known as:  PROVENTIL HFA;VENTOLIN HFA  Inhale 2 puffs into the lungs every 6 (six) hours as needed for wheezing.     allopurinol 300 MG tablet  Commonly known as:  ZYLOPRIM  Take 300 mg by mouth daily.     amLODipine 5 MG tablet  Commonly known as:  NORVASC  Take 1  tablet (5 mg total) by mouth daily.     aspirin EC 81 MG tablet  Take 1 tablet (81 mg total) by mouth daily.     atorvastatin 80 MG tablet  Commonly known as:  LIPITOR  Take 80 mg by mouth every morning.     brimonidine 0.2 % ophthalmic solution  Commonly known as:  ALPHAGAN  Place 1 drop into both eyes 2 (two) times daily.     colchicine 0.6 MG tablet  Take 1 tablet (0.6 mg total) by mouth daily as needed (for gout flare-ups).     divalproex 500 MG 24 hr tablet  Commonly known as:  DEPAKOTE ER  Take 2 tablets (1,000 mg total) by  mouth at bedtime. Bipolar disorder mood stabilization.     Fluticasone-Salmeterol 250-50 MCG/DOSE Aepb  Commonly known as:  ADVAIR DISKUS  Inhale 1 puff into the lungs 2 times daily at 12 noon and 4 pm.     gabapentin 100 MG capsule  Commonly known as:  NEURONTIN  Take 1 capsule (100 mg total) by mouth 3 (three) times daily. Neuropathic pain.     hydrOXYzine 25 MG tablet  Commonly known as:  ATARAX/VISTARIL  Take 1 tablet (25 mg total) by mouth every 6 (six) hours as needed for anxiety.     insulin aspart 100 UNIT/ML injection  Commonly known as:  novoLOG  Inject 0-20 Units into the skin 3 (three) times daily with meals.     insulin aspart 100 UNIT/ML injection  Commonly known as:  novoLOG  Inject 0-5 Units into the skin at bedtime.     insulin aspart 100 UNIT/ML injection  Commonly known as:  NOVOLOG  Inject 4 Units into the skin 3 (three) times daily before meals.     insulin glargine 100 UNIT/ML injection  Commonly known as:  LANTUS  Inject 0.26 mLs (26 Units total) into the skin at bedtime.     latanoprost 0.005 % ophthalmic solution  Commonly known as:  XALATAN  Place 1 drop into both eyes at bedtime.     levothyroxine 125 MCG tablet  Commonly known as:  SYNTHROID, LEVOTHROID  Take 2 tablets (250 mcg total) by mouth daily before breakfast.     LORazepam 1 MG tablet  Commonly known as:  ATIVAN  Take 1 tablet (1 mg total) by mouth  every 8 (eight) hours as needed for anxiety (agitation).     nitroGLYCERIN 0.4 MG SL tablet  Commonly known as:  NITROSTAT  Place 1 tablet (0.4 mg total) under the tongue every 5 (five) minutes as needed for chest pain.     OLANZapine 10 MG tablet  Commonly known as:  ZYPREXA  Take 1 tablet (10 mg total) by mouth at bedtime. For bipolar disorder.     oxyCODONE 5 MG immediate release tablet  Commonly known as:  Oxy IR/ROXICODONE  Take 1 tablet (5 mg total) by mouth every 3 (three) hours as needed for severe pain.     pantoprazole 40 MG tablet  Commonly known as:  PROTONIX  Take 1 tablet (40 mg total) by mouth daily.     sertraline 50 MG tablet  Commonly known as:  ZOLOFT  Take 3 tablets (150 mg total) by mouth daily. For anxiety and depression.     traZODone 100 MG tablet  Commonly known as:  DESYREL  Take 1 tablet (100 mg total) by mouth at bedtime.           Follow-up Information   Schedule an appointment as soon as possible for a visit with Windell Hummingbird, MD.   Specialty:  Internal Medicine   Contact information:   [address] [place] [address] [phone]        The results of significant diagnostics from this hospitalization (including imaging, microbiology, ancillary and laboratory) are listed below for reference.    Significant Diagnostic Studies: Dg Chest 2 View  03/10/2013   CLINICAL DATA:  Chest pain. As above.  EXAM: CHEST  2 VIEW  COMPARISON:  11/07/2012.  FINDINGS: Cardiomegaly. Calcified tortuous aorta. Moderate vascular congestion. No airspace consolidation or overt failure. Negative osseous structures.  IMPRESSION: Worsening aeration. Cardiomegaly with mild vascular congestion.   Electronically Signed  By: Davonna Belling M.D.   On: 03/10/2013 20:21    Microbiology: No results found for this or any previous visit (from the past 240 hour(s)).   Labs: Basic Metabolic Panel:  Recent Labs Lab 04/05/13 1130 04/08/13 1443  04/09/13 0355  NA 133* 131* 132*  K 4.0 4.2 4.3  CL 96 95* 97  CO2 22 21 23   GLUCOSE 417* 429* 269*  BUN 23 18 19   CREATININE 1.57* 1.38* 1.18*  CALCIUM 10.1 9.9 9.1  MG  --  1.7  --   PHOS  --  2.9  --    Liver Function Tests:  Recent Labs Lab 04/05/13 1130 04/08/13 1443 04/09/13 0355  AST 18 17 13   ALT 13 12 10   ALKPHOS 141* 120* 110  BILITOT 0.3 0.2* 0.2*  PROT 8.0 7.1 6.5  ALBUMIN 4.1 3.7 3.3*   No results found for this basename: LIPASE, AMYLASE,  in the last 168 hours No results found for this basename: AMMONIA,  in the last 168 hours CBC:  Recent Labs Lab 04/05/13 1130 04/08/13 1443 04/09/13 0355  WBC 7.1 5.7 6.6  NEUTROABS 4.4 3.3  --   HGB 14.3 12.0 11.6*  HCT 41.7 36.4 34.8*  MCV 84.4 85.4 86.1  PLT 334 244 244   Cardiac Enzymes: No results found for this basename: CKTOTAL, CKMB, CKMBINDEX, TROPONINI,  in the last 168 hours BNP: BNP (last 3 results)  Recent Labs  03/10/13 1735  PROBNP 47.4   CBG:  Recent Labs Lab 04/08/13 1227 04/08/13 1259 04/08/13 1656 04/08/13 2127 04/09/13 0730  GLUCAP 566* >600* 280* 234* 178*    Time coordinating discharge: Over 30 minutes

## 2013-04-09 NOTE — Progress Notes (Signed)
Clinical Social Work Department CLINICAL SOCIAL WORK PSYCHIATRY SERVICE LINE ASSESSMENT 04/09/2013  Patient:  [name]  Account:  [id number]  Admit Date:  04/08/2013  Clinical Social Worker:  Unk Lightning, LCSW  Date/Time:  04/09/2013 11:00 AM Referred by:  Physician  Date referred:  04/09/2013 Reason for Referral  Psychosocial assessment   Presenting Symptoms/Problems (In the person's/family's own words):   Psych consulted due to patient being admitted from [facility]   Abuse/Neglect/Trauma History (check all that apply)  Denies history   Abuse/Neglect/Trauma Comments:   Psychiatric History (check all that apply)  Outpatient treatment  Inpatient/hospitilization   Psychiatric medications:  Depakote 1000 mg  Zyprexa 10 mg  Zoloft 150 mg  Trazodone 100mg    Current Mental Health Hospitalizations/Previous Mental Health History:   Patient reports she receives medication management.   Current provider:   Place and Date:   [place], [place]   Current Medications:   Scheduled Meds:      . allopurinol  300 mg Oral Daily  . amLODipine  5 mg Oral Daily  . aspirin EC  81 mg Oral Daily  . atorvastatin  80 mg Oral q1800  . brimonidine  1 drop Both Eyes BID  . divalproex  1,000 mg Oral QHS  . gabapentin  100 mg Oral TID  . insulin aspart  0-20 Units Subcutaneous TID WC  . insulin aspart  0-5 Units Subcutaneous QHS  . insulin glargine  26 Units Subcutaneous QHS  . latanoprost  1 drop Both Eyes QHS  . levothyroxine  250 mcg Oral QAC breakfast  . mometasone-formoterol  2 puff Inhalation BID  . OLANZapine  10 mg Oral QHS  . pantoprazole  40 mg Oral Daily  . sertraline  150 mg Oral Daily  . traZODone  100 mg Oral QHS        Continuous Infusions:      . sodium chloride 100 mL/hr at 04/09/13 0149          PRN Meds:.acetaminophen, acetaminophen, albuterol, colchicine, fentaNYL, hydrOXYzine, LORazepam, ondansetron (ZOFRAN) IV, ondansetron, oxyCODONE       Previous Impatient  Admission/Date/Reason:   Patient was admitted from [facility].   Emotional Health / Current Symptoms    Suicide/Self Harm  Suicide attempt in past (date/description)   Suicide attempt in the past:   Patient was admitted to [facility] after attempting to overdose. Patient continues to endorse SI.   Other harmful behavior:   None reported   Psychotic/Dissociative Symptoms  None reported   Other Psychotic/Dissociative Symptoms:   N/A    Attention/Behavioral Symptoms  Other - See comment   Other Attention / Behavioral Symptoms:   Drowsy and withdrawn    Cognitive Impairment  Within Normal Limits   Other Cognitive Impairment:   Patient oriented x3    Mood and Adjustment  Lethargic    Stress, Anxiety, Trauma, Any Recent Loss/Stressor  Relationship   Anxiety (frequency):   N/A   Phobia (specify):   N/A   Compulsive behavior (specify):   N/A   Obsessive behavior (specify):   N/A   Other:   Patient reports stressful relationships with roommates.   Substance Abuse/Use  None   SBIRT completed (please refer for detailed history):  N  Self-reported substance use:   Patient denies any substance use. Patient refuses to complete SBIRT.   Urinary Drug Screen Completed:  Y Alcohol level:   <11    Environmental/Housing/Living Arrangement  Stable housing   Who is in the home:   Friend  and his niece   Emergency contact:  Donald-boyfriend   Personnel officer   Patient's Strengths and Goals (patient's own words):   Patient reports that boyfriend is supportive.   Clinical Social Worker's Interpretive Summary:   CSW received referral to complete assessment. Per chart review, patient was admitted from [facility]. CSW spoke with [facility] who initially reported that patient would be accepted back at [facility] but then stated that due to patient completing detox that she would need to be evaluated again.    CSW met with patient at bedside and introduced myself. Patient reports  that she was upset and dealing with stress and tried to overdose. Patient lives with boyfriend and his niece and is concerned about niece's behaviors and reports a strained relationship.    Patient has been diagnosed with bipolar and schizophrenia and receives medication management. Patient is not always compliant with medications and reports limited coping skills. Patient reports she was feeling suicidal on day of admission and continues to endorse those feelings. Patient reports she has access to pills and could overdose again.    CSW asked [facility] to get psych MD to evaluate patient and make recommendations on disposition. Patient drowsy throughout assessment with flat affect.    Patient agreeable for CSW to continue to follow and open to further sessions.   Disposition:  Recommend Psych CSW continuing to support while in hospital   [place], [place] [phone]

## 2013-04-09 NOTE — Progress Notes (Signed)
Adult Psychoeducational Group Note  Date:  04/09/2013 Time:  8:00PM Group Topic/Focus:  Wrap-Up Group:   The focus of this group is to help patients review their daily goal of treatment and discuss progress on daily workbooks.  Participation Level:  Active  Participation Quality:  Appropriate and Attentive  Affect:  Appropriate  Cognitive:  Alert and Appropriate  Insight: Appropriate  Engagement in Group:  Engaged  Modes of Intervention:  Socialization  Additional Comments:  Pt. Was appropriate and attentive during tonight's group. Pt attended Karaoke.    Bing Plume D 04/09/2013, 8:24 PM

## 2013-04-09 NOTE — Progress Notes (Signed)
Clinical Social Work  CSW received a call from [name]a Areola) asking to delay DC until later so that facility could be prepared for admission. [facility] will call when ready to accept.  [place], [place] [phone]

## 2013-04-09 NOTE — Consult Note (Signed)
[facility] Face-to-Face Psychiatry Consult   Reason for Consult:  Depression and suicidal thinking Referring Physician:  Dr Leonidas Romberg is an 57 y.o. female.  Assessment: AXIS I:  Major Depression, Recurrent severe AXIS II:  Deferred AXIS III:   Past Medical History  Diagnosis Date  . Asthma     past 10 years  . Diabetes mellitus     past 5 years  . Glaucoma     dx 06/2011-[facility]  . Hypertension   . Hyperlipidemia   . Hyperthyroidism   . History of TIA (transient ischemic attack)     01/06/2011, x2  . Pancreatitis   . Gout   . Depression   . Renal disorder     chronic kidney disease, stage 3  . Chronic pain   . Glaucoma   . Arthritis     djd -lower back, left knee  . Degenerative disc disease   . Bipolar disorder   . Gastritis   . COPD (chronic obstructive pulmonary disease)   . Schizophrenia   . 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 Colon polyps     colonoscopy 01/2013   . Fatty liver     mild CT 06/2012   AXIS IV:  other psychosocial or environmental problems, problems related to social environment and problems with primary support group AXIS V:  11-20 some danger of hurting self or others possible OR occasionally fails to maintain minimal personal hygiene OR gross impairment in communication  Plan:  Recommend psychiatric Inpatient admission when medically cleared.  Subjective:   [name] is a 57 y.o. female patient admitted with hyperglycemia.Marland Kitchen  HPI:  Patient seen chart reviewed.  Patient is a 57 year old African American female who was admitted on the medical floor from the behavioral [facility] because of high blood sugar.  Initially she was admitted to behavioral Health Center because of worsening of depression and noncompliance with her psychotropic medication.  She was also positive for cocaine.  She was complaining of suicidal thoughts and feeling worthless and hopeless.  Patient continued to endorse the symptoms.  She feels  strong suicidal thoughts however denies any plan.  She endorsed anhedonia, poor attention, poor concentration and does not feel safe going home.  She is taking Zyprexa.  She has no tremors or shakes.  She admitted paranoia and sometimes hallucination but denies any homicidal thoughts.  Her insulin is adjusted.  She still feels dizzy however feeling better from the past. HPI Elements:   Location:  Medical floor. Quality:  Poor.  Past Psychiatric History: Past Medical History  Diagnosis Date  . Asthma     past 10 years  . Diabetes mellitus     past 5 years  . Glaucoma     dx 06/2011-[facility]  . Hypertension   . Hyperlipidemia   . Hyperthyroidism   . History of TIA (transient ischemic attack)     01/06/2011, x2  . Pancreatitis   . Gout   . Depression   . Renal disorder     chronic kidney disease, stage 3  . Chronic pain   . Glaucoma   . Arthritis     djd -lower back, left knee  . Degenerative disc disease   . Bipolar disorder   . Gastritis   . COPD (chronic obstructive pulmonary disease)   . Schizophrenia   . 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 Colon polyps     colonoscopy 01/2013   .  Fatty liver     mild CT 06/2012    reports that she has been smoking Cigarettes.  She has a .3 pack-year smoking history. She has never used smokeless tobacco. She reports that she uses illicit drugs (Cocaine). She reports that she does not drink alcohol. Family History  Problem Relation Age of Onset  . Arthritis Mother   . Diabetes Mother   . Hypertension Mother   . Kidney disease Mother   . Heart disease Mother     died 16  . Heart disease      mother & father  . Hypertension      mother & father  . Colon cancer Neg Hx   . Prostate cancer Neg Hx   . Breast cancer Neg Hx   . Stomach cancer Neg Hx   . Hypertension Sister   . Hypertension Brother   . Kidney disease Brother      Living Arrangements: Alone   Abuse/Neglect [facility]) Physical Abuse: Denies Verbal  Abuse: Denies Sexual Abuse: Denies Allergies:   Allergies  Allergen Reactions  . Hydrocodone-Acetaminophen Hives  . Metformin Diarrhea    ACT Assessment Complete:  Yes:    Educational Status    Risk to Self: Risk to self Is patient at risk for suicide?: Yes Substance abuse history and/or treatment for substance abuse?: Yes  Risk to Others:    Abuse: Abuse/Neglect Assessment (Assessment to be complete while patient is alone) Physical Abuse: Denies Verbal Abuse: Denies Sexual Abuse: Denies Exploitation of patient/patient's resources: Denies Self-Neglect: Denies  Prior Inpatient Therapy:    Prior Outpatient Therapy:    Additional Information:                    Objective: Blood pressure 110/56, pulse 70, temperature 98 F (36.7 C), temperature source Oral, resp. rate 18, height 5\' 2"  (1.575 m), weight 190 lb (86.183 kg), SpO2 91.00%.Body mass index is 34.74 kg/(m^2). Results for orders placed during the hospital encounter of 04/08/13 (from the past 72 hour(s))  COMPREHENSIVE METABOLIC PANEL     Status: Abnormal   Collection Time    04/08/13  2:43 PM      Result Value Range   Sodium 131 (*) 135 - 145 mEq/L   Potassium 4.2  3.5 - 5.1 mEq/L   Chloride 95 (*) 96 - 112 mEq/L   CO2 21  19 - 32 mEq/L   Glucose, Bld 429 (*) 70 - 99 mg/dL   BUN 18  6 - 23 mg/dL   Creatinine, Ser 4.54 (*) 0.50 - 1.10 mg/dL   Calcium 9.9  8.4 - 09.8 mg/dL   Total Protein 7.1  6.0 - 8.3 g/dL   Albumin 3.7  3.5 - 5.2 g/dL   AST 17  0 - 37 U/L   ALT 12  0 - 35 U/L   Alkaline Phosphatase 120 (*) 39 - 117 U/L   Total Bilirubin 0.2 (*) 0.3 - 1.2 mg/dL   GFR calc non Af Amer 42 (*) >90 mL/min   GFR calc Af Amer 49 (*) >90 mL/min   Comment: (NOTE)     The eGFR has been calculated using the CKD EPI equation.     This calculation has not been validated in all clinical situations.     eGFR's persistently <90 mL/min signify possible Chronic Kidney     Disease.  MAGNESIUM     Status: None    Collection Time    04/08/13  2:43 PM  Result Value Range   Magnesium 1.7  1.5 - 2.5 mg/dL  PHOSPHORUS     Status: None   Collection Time    04/08/13  2:43 PM      Result Value Range   Phosphorus 2.9  2.3 - 4.6 mg/dL  CBC WITH DIFFERENTIAL     Status: Abnormal   Collection Time    04/08/13  2:43 PM      Result Value Range   WBC 5.7  4.0 - 10.5 K/uL   RBC 4.26  3.87 - 5.11 MIL/uL   Hemoglobin 12.0  12.0 - 15.0 g/dL   HCT 45.4  09.8 - 11.9 %   MCV 85.4  78.0 - 100.0 fL   MCH 28.2  26.0 - 34.0 pg   MCHC 33.0  30.0 - 36.0 g/dL   RDW 14.7 (*) 82.9 - 56.2 %   Platelets 244  150 - 400 K/uL   Neutrophils Relative % 59  43 - 77 %   Neutro Abs 3.3  1.7 - 7.7 K/uL   Lymphocytes Relative 33  12 - 46 %   Lymphs Abs 1.9  0.7 - 4.0 K/uL   Monocytes Relative 7  3 - 12 %   Monocytes Absolute 0.4  0.1 - 1.0 K/uL   Eosinophils Relative 1  0 - 5 %   Eosinophils Absolute 0.1  0.0 - 0.7 K/uL   Basophils Relative 0  0 - 1 %   Basophils Absolute 0.0  0.0 - 0.1 K/uL  APTT     Status: None   Collection Time    04/08/13  2:43 PM      Result Value Range   aPTT 28  24 - 37 seconds  PROTIME-INR     Status: None   Collection Time    04/08/13  2:43 PM      Result Value Range   Prothrombin Time 12.4  11.6 - 15.2 seconds   INR 0.94  0.00 - 1.49  TSH     Status: Abnormal   Collection Time    04/08/13  2:43 PM      Result Value Range   TSH 12.047 (*) 0.350 - 4.500 uIU/mL   Comment: Performed at [facility]  HEMOGLOBIN A1C     Status: Abnormal   Collection Time    04/08/13  2:43 PM      Result Value Range   Hemoglobin A1C 13.6 (*) <5.7 %   Comment: (NOTE)                                                                               According to the ADA Clinical Practice Recommendations for 2011, when     HbA1c is used as a screening test:      >=6.5%   Diagnostic of Diabetes Mellitus               (if abnormal result is confirmed)     5.7-6.4%   Increased risk of developing  Diabetes Mellitus     References:Diagnosis and Classification of Diabetes Mellitus,Diabetes     Care,2011,34(Suppl 1):S62-S69 and Standards of Medical Care in  Diabetes - 2011,Diabetes Care,2011,34 (Suppl 1):S11-S61.   Mean Plasma Glucose 344 (*) <117 mg/dL   Comment: Performed at [facility]  GLUCOSE, CAPILLARY     Status: Abnormal   Collection Time    04/08/13  4:56 PM      Result Value Range   Glucose-Capillary 280 (*) 70 - 99 mg/dL   Comment 1 Documented in Chart     Comment 2 Notify RN    URINALYSIS, ROUTINE W REFLEX MICROSCOPIC     Status: Abnormal   Collection Time    04/08/13  7:31 PM      Result Value Range   Color, Urine YELLOW  YELLOW   APPearance CLEAR  CLEAR   Specific Gravity, Urine 1.022  1.005 - 1.030   pH 5.5  5.0 - 8.0   Glucose, UA >1000 (*) NEGATIVE mg/dL   Hgb urine dipstick NEGATIVE  NEGATIVE   Bilirubin Urine NEGATIVE  NEGATIVE   Ketones, ur NEGATIVE  NEGATIVE mg/dL   Protein, ur NEGATIVE  NEGATIVE mg/dL   Urobilinogen, UA 0.2  0.0 - 1.0 mg/dL   Nitrite NEGATIVE  NEGATIVE   Leukocytes, UA NEGATIVE  NEGATIVE  URINE RAPID DRUG SCREEN (HOSP PERFORMED)     Status: None   Collection Time    04/08/13  7:31 PM      Result Value Range   Opiates NONE DETECTED  NONE DETECTED   Cocaine NONE DETECTED  NONE DETECTED   Benzodiazepines NONE DETECTED  NONE DETECTED   Amphetamines NONE DETECTED  NONE DETECTED   Tetrahydrocannabinol NONE DETECTED  NONE DETECTED   Barbiturates NONE DETECTED  NONE DETECTED   Comment:            DRUG SCREEN FOR MEDICAL PURPOSES     ONLY.  IF CONFIRMATION IS NEEDED     FOR ANY PURPOSE, NOTIFY LAB     WITHIN 5 DAYS.                LOWEST DETECTABLE LIMITS     FOR URINE DRUG SCREEN     Drug Class       Cutoff (ng/mL)     Amphetamine      1000     Barbiturate      200     Benzodiazepine   200     Tricyclics       300     Opiates          300     Cocaine          300     THC              50  URINE  MICROSCOPIC-ADD ON     Status: None   Collection Time    04/08/13  7:31 PM      Result Value Range   Squamous Epithelial / LPF RARE  RARE   WBC, UA 0-2  <3 WBC/hpf   Bacteria, UA RARE  RARE  GLUCOSE, CAPILLARY     Status: Abnormal   Collection Time    04/08/13  9:27 PM      Result Value Range   Glucose-Capillary 234 (*) 70 - 99 mg/dL   Comment 1 Documented in Chart     Comment 2 Notify RN    COMPREHENSIVE METABOLIC PANEL     Status: Abnormal   Collection Time    04/09/13  3:55 AM      Result Value Range   Sodium 132 (*) 135 -  145 mEq/L   Potassium 4.3  3.5 - 5.1 mEq/L   Chloride 97  96 - 112 mEq/L   CO2 23  19 - 32 mEq/L   Glucose, Bld 269 (*) 70 - 99 mg/dL   BUN 19  6 - 23 mg/dL   Creatinine, Ser 1.61 (*) 0.50 - 1.10 mg/dL   Calcium 9.1  8.4 - 09.6 mg/dL   Total Protein 6.5  6.0 - 8.3 g/dL   Albumin 3.3 (*) 3.5 - 5.2 g/dL   AST 13  0 - 37 U/L   ALT 10  0 - 35 U/L   Alkaline Phosphatase 110  39 - 117 U/L   Total Bilirubin 0.2 (*) 0.3 - 1.2 mg/dL   GFR calc non Af Amer 51 (*) >90 mL/min   GFR calc Af Amer 59 (*) >90 mL/min   Comment: (NOTE)     The eGFR has been calculated using the CKD EPI equation.     This calculation has not been validated in all clinical situations.     eGFR's persistently <90 mL/min signify possible Chronic Kidney     Disease.  CBC     Status: Abnormal   Collection Time    04/09/13  3:55 AM      Result Value Range   WBC 6.6  4.0 - 10.5 K/uL   RBC 4.04  3.87 - 5.11 MIL/uL   Hemoglobin 11.6 (*) 12.0 - 15.0 g/dL   HCT 04.5 (*) 40.9 - 81.1 %   MCV 86.1  78.0 - 100.0 fL   MCH 28.7  26.0 - 34.0 pg   MCHC 33.3  30.0 - 36.0 g/dL   RDW 91.4 (*) 78.2 - 95.6 %   Platelets 244  150 - 400 K/uL  GLUCOSE, CAPILLARY     Status: Abnormal   Collection Time    04/09/13  7:30 AM      Result Value Range   Glucose-Capillary 178 (*) 70 - 99 mg/dL   Comment 1 Documented in Chart     Comment 2 Notify RN    GLUCOSE, CAPILLARY     Status: Abnormal   Collection  Time    04/09/13 11:00 AM      Result Value Range   Glucose-Capillary 157 (*) 70 - 99 mg/dL   Comment 1 Documented in Chart     Comment 2 Notify RN     Labs are reviewed and are pertinent for hyper glycemia.  Current Facility-Administered Medications  Medication Dose Route Frequency Provider Last Rate Last Dose  . 0.9 %  sodium chloride infusion   Intravenous Continuous Alison Murray, MD 100 mL/hr at 04/09/13 0149    . acetaminophen (TYLENOL) tablet 650 mg  650 mg Oral Q6H PRN Alison Murray, MD       Or  . acetaminophen (TYLENOL) suppository 650 mg  650 mg Rectal Q6H PRN Alison Murray, MD      . albuterol (PROVENTIL HFA;VENTOLIN HFA) 108 (90 BASE) MCG/ACT inhaler 2 puff  2 puff Inhalation Q6H PRN Alison Murray, MD      . allopurinol (ZYLOPRIM) tablet 300 mg  300 mg Oral Daily Alison Murray, MD   300 mg at 04/09/13 1003  . amLODipine (NORVASC) tablet 5 mg  5 mg Oral Daily Alison Murray, MD      . aspirin EC tablet 81 mg  81 mg Oral Daily Alison Murray, MD   81 mg at 04/09/13 1005  . atorvastatin (LIPITOR)  tablet 80 mg  80 mg Oral q1800 Alison Murray, MD      . brimonidine (ALPHAGAN) 0.2 % ophthalmic solution 1 drop  1 drop Both Eyes BID Alison Murray, MD   1 drop at 04/09/13 1003  . colchicine tablet 0.6 mg  0.6 mg Oral Daily PRN Alison Murray, MD      . divalproex (DEPAKOTE ER) 24 hr tablet 1,000 mg  1,000 mg Oral QHS Alison Murray, MD   1,000 mg at 04/08/13 2142  . fentaNYL (SUBLIMAZE) injection 12.5 mcg  12.5 mcg Intravenous Q4H PRN Alison Murray, MD      . gabapentin (NEURONTIN) capsule 100 mg  100 mg Oral TID Alison Murray, MD   100 mg at 04/09/13 1003  . hydrOXYzine (ATARAX/VISTARIL) tablet 25 mg  25 mg Oral Q6H PRN Alison Murray, MD   25 mg at 04/08/13 2154  . insulin aspart (novoLOG) injection 0-20 Units  0-20 Units Subcutaneous TID WC Alison Murray, MD   4 Units at 04/09/13 1230  . insulin aspart (novoLOG) injection 0-5 Units  0-5 Units Subcutaneous QHS Alison Murray, MD   2 Units  at 04/08/13 2142  . insulin glargine (LANTUS) injection 26 Units  26 Units Subcutaneous QHS Alison Murray, MD   26 Units at 04/08/13 2143  . latanoprost (XALATAN) 0.005 % ophthalmic solution 1 drop  1 drop Both Eyes QHS Alison Murray, MD   1 drop at 04/08/13 2144  . levothyroxine (SYNTHROID, LEVOTHROID) tablet 250 mcg  250 mcg Oral QAC breakfast Alison Murray, MD   250 mcg at 04/09/13 0740  . LORazepam (ATIVAN) tablet 1 mg  1 mg Oral Q8H PRN Alison Murray, MD   1 mg at 04/08/13 2154  . mometasone-formoterol (DULERA) 100-5 MCG/ACT inhaler 2 puff  2 puff Inhalation BID Alison Murray, MD   2 puff at 04/09/13 [phone]  . OLANZapine (ZYPREXA) tablet 10 mg  10 mg Oral QHS Alison Murray, MD   10 mg at 04/08/13 2142  . ondansetron (ZOFRAN) tablet 4 mg  4 mg Oral Q6H PRN Alison Murray, MD       Or  . ondansetron [facility]) injection 4 mg  4 mg Intravenous Q6H PRN Alison Murray, MD      . oxyCODONE (Oxy IR/ROXICODONE) immediate release tablet 5 mg  5 mg Oral Q3H PRN Alison Murray, MD   5 mg at 04/09/13 [phone]  . pantoprazole (PROTONIX) EC tablet 40 mg  40 mg Oral Daily Alison Murray, MD   40 mg at 04/09/13 1003  . sertraline (ZOLOFT) tablet 150 mg  150 mg Oral Daily Alison Murray, MD   150 mg at 04/09/13 1003  . traZODone (DESYREL) tablet 100 mg  100 mg Oral QHS Alison Murray, MD   100 mg at 04/08/13 2142    Psychiatric Specialty Exam:     Blood pressure 110/56, pulse 70, temperature 98 F (36.7 C), temperature source Oral, resp. rate 18, height 5\' 2"  (1.575 m), weight 190 lb (86.183 kg), SpO2 91.00%.Body mass index is 34.74 kg/(m^2).  General Appearance: Disheveled and Guarded  Patent attorney::  Fair  Speech:  Slow  Volume:  Decreased  Mood:  Depressed and Dysphoric  Affect:  Constricted and Depressed  Thought Process:  Linear  Orientation:  Full (Time, Place, and Person)  Thought Content:  Hallucinations: Auditory, Paranoid Ideation and Rumination  Suicidal Thoughts:  Yes.  without intent/plan  Homicidal  Thoughts:  No  Memory:  NA  Judgement:  Impaired  Insight:  Lacking  Psychomotor Activity:  Decreased  Concentration:  Fair  Recall:  Fair  Akathisia:  No  Handed:  Right  AIMS (if indicated):     Assets:  Communication Skills Desire for Improvement  Sleep:      Treatment Plan Summary: Medication management Patient requires inpatient psychiatric treatment.  Continue sitter for safety.  Recommend to try Haldol and discontinue Zyprexa since patient has high blood sugar which could be contributing by Zyprexa.  Once medically cleared transfer to behavioral Health Center for inpatient treatment and stabilization.  Please call [phone] if you have further questions. Miasia Crabtree T. 04/09/2013 3:01 PM

## 2013-04-09 NOTE — Progress Notes (Signed)
Clinical Social Work  [facility] agreeable to accept patient at [employer]. CSW set up transportation via [employer]. Patient and RN aware of plans.  [place], [place] [phone]

## 2013-04-09 NOTE — BH Assessment (Signed)
Received a call from Dr. Elisabeth Pigeon. Sts that patient was sent to the medical floor from [facility] inpatient unit yesterday. Says that patient is now medically cleared. She requested a psych consult.   Writer called the unit to see if patient's bed was still available. Per [facility] this patient's bed was no longer available. However Verne Spurr, NP agreed to accept patient as a new admission. Patient assigned to bed 304-1. The attending physician is Dr. Geoffery Lyons. Patient will need support paperwork such as voluntary form signed and faxed prior to transferring back to [facility].  Received a call from [place], SW and Clinical research associate updated her with the above information. She will call TTS when patient is ready for transfer to [facility].

## 2013-04-09 NOTE — Plan of Care (Signed)
Problem: Food- and Nutrition-Related Knowledge Deficit (NB-1.1) Goal: Nutrition education Formal process to instruct or train a patient/client in a skill or to impart knowledge to help patients/clients voluntarily manage or modify food choices and eating behavior to maintain or improve health. Outcome: Completed/Met Date Met:  04/09/13  RD consulted for nutrition education regarding diabetes.     Lab Results  Component Value Date    HGBA1C 13.6* 04/08/2013    RD provided "Carbohydrate Counting for People with Diabetes" handout from the Academy of Nutrition and Dietetics. Discussed different food groups and their effects on blood sugar, emphasizing carbohydrate-containing foods. Provided list of carbohydrates and recommended serving sizes of common foods.  Discussed importance of controlled and consistent carbohydrate intake throughout the day. Provided examples of ways to balance meals/snacks and encouraged intake of high-fiber, whole grain complex carbohydrates. Teach back method used.  Expect good compliance.  Body mass index is 34.74 kg/(m^2). Pt meets criteria for obesity based on current BMI.  Current diet order is heart healthy, carb modified, patient is consuming approximately 100% of meals at this time. Labs and medications reviewed. No further nutrition interventions warranted at this time. RD contact information provided. If additional nutrition issues arise, please re-consult RD.  Ebbie Latus RD, LDN

## 2013-04-09 NOTE — Progress Notes (Signed)
[facility] PROGRESS NOTE  [name] ION:[id number] DOB: 01/14/1956 DOA: 04/08/2013 PCP: Windell Hummingbird, MD  Brief narrative: 57 year old female with multiple medical comorbidities including but not limited to diabetes, hypertension, major depressive disorder, schizophrenia, gout, COPD who was initially admitted to behavioral for management of severe depression. Patient had high CBGs in Behavioral Health unable to control it with sliding scale insulin for which reason patient required transfer to [facility].  Assessment and Plan:   Principal Problem:  Hyperglycemia  - initial blood work revealed hyperglycemia without DKA or hyperosmolar state - CBG's in past 24 hours are 280, 234 but the last one is at 9 pm yesterday; need more values to determine the right lantus and meal coverage dose - now on lantus 26 units at bedtime; will add novolog 6 units TIDAC - A1c on this admission is 13.6 compared to 01/2013 when it was 11.6 Active Problems:  Hypothyroidism  - Check TSH level - 12.047 - Continue levothyroxine but increase the dose DM (diabetes mellitus), uncontrolled with complications of neuropathy  - A1c in September 2014 was 11.6 indicating poor glycemic control; now 13.6 - appreciate diabetic coordinator input Chronic back pain  - Pain management with fentanyl low-dose IV and oxycodone per patient request; it does appear she has allergic reaction to this type of medications but she says she was able to take this medication before HTN (hypertension)  - Continue Norvasc  - BP 110//54 this am Schizophrenia  - Continue Zyprexa  Gout  - Continue colchicine and allopurinol  Peripheral neuropathy  - Continue gabapentin  Depression with anxiety  - Continue Zoloft and Ativan  - Sitter at bedside  Hyperlipidemia  - Continue statin therapy  COPD exacerbation  - Stable, continue albuterol inhaler, Dulera   Code Status: Full  Family Communication: No family at the bedside   Disposition Plan: transfer to [facility] once stable in terms of blood sugar control   Manson Passey, MD  [facility] Pager [phone]  If 7PM-7AM, please contact night-coverage [email or web address] Password [email or web address] 04/09/2013, 6:54 AM   LOS: 1 day   Consultants:  Psych for transfer to [facility]  Procedures:  None   Antibiotics:  None   HPI/Subjective: No acute overnight events.  Objective: Filed Vitals:   04/08/13 1403 04/08/13 2122 04/09/13 0600  BP: 113/69 125/70 110/54  Pulse: 78 80 80  Temp: 97.3 F (36.3 C) 98.3 F (36.8 C) 98.6 F (37 C)  TempSrc: Oral Oral Oral  Resp: 16 20 20   Height: 5\' 2"  (1.575 m)    Weight: 86.183 kg (190 lb)    SpO2: 99% 97% 96%    Intake/Output Summary (Last 24 hours) at 04/09/13 0654 Last data filed at 04/08/13 2005  Gross per 24 hour  Intake    480 ml  Output      0 ml  Net    480 ml    Exam:   General:  Pt is alert, follows commands appropriately, not in acute distress  Cardiovascular: Regular rate and rhythm, S1/S2, no murmurs, no rubs, no gallops  Respiratory: Clear to auscultation bilaterally, no wheezing, no crackles, no rhonchi  Abdomen: Soft, non tender, non distended, bowel sounds present, no guarding  Extremities: No edema, pulses DP and PT palpable bilaterally  Neuro: Grossly nonfocal  Data Reviewed: Basic Metabolic Panel:  Recent Labs Lab 04/05/13 1130 04/08/13 1443 04/09/13 0355  NA 133* 131* 132*  K 4.0 4.2 4.3  CL 96 95* 97  CO2 22 21 23   GLUCOSE  417* 429* 269*  BUN 23 18 19   CREATININE 1.57* 1.38* 1.18*  CALCIUM 10.1 9.9 9.1  MG  --  1.7  --   PHOS  --  2.9  --    Liver Function Tests:  Recent Labs Lab 04/05/13 1130 04/08/13 1443 04/09/13 0355  AST 18 17 13   ALT 13 12 10   ALKPHOS 141* 120* 110  BILITOT 0.3 0.2* 0.2*  PROT 8.0 7.1 6.5  ALBUMIN 4.1 3.7 3.3*   No results found for this basename: LIPASE, AMYLASE,  in the last 168 hours No results found for this basename: AMMONIA,  in the  last 168 hours CBC:  Recent Labs Lab 04/05/13 1130 04/08/13 1443 04/09/13 0355  WBC 7.1 5.7 6.6  NEUTROABS 4.4 3.3  --   HGB 14.3 12.0 11.6*  HCT 41.7 36.4 34.8*  MCV 84.4 85.4 86.1  PLT 334 244 244   Cardiac Enzymes: No results found for this basename: CKTOTAL, CKMB, CKMBINDEX, TROPONINI,  in the last 168 hours BNP: No components found with this basename: POCBNP,  CBG:  Recent Labs Lab 04/08/13 1147 04/08/13 1227 04/08/13 1259 04/08/13 1656 04/08/13 2127  GLUCAP 513* 566* >600* 280* 234*    No results found for this or any previous visit (from the past 240 hour(s)).   Studies: No results found.  Scheduled Meds: . allopurinol  300 mg Oral Daily  . amLODipine  5 mg Oral Daily  . aspirin EC  81 mg Oral Daily  . atorvastatin  80 mg Oral q1800  . brimonidine  1 drop Both Eyes BID  . divalproex  1,000 mg Oral QHS  . gabapentin  100 mg Oral TID  . insulin aspart  0-20 Units Subcutaneous TID WC  . insulin aspart  0-5 Units Subcutaneous QHS  . insulin glargine  26 Units Subcutaneous QHS  . latanoprost  1 drop Both Eyes QHS  . levothyroxine  250 mcg Oral QAC breakfast  . mometasone-formoterol  2 puff Inhalation BID  . OLANZapine  10 mg Oral QHS  . pantoprazole  40 mg Oral Daily  . sertraline  150 mg Oral Daily  . traZODone  100 mg Oral QHS   Continuous Infusions: . sodium chloride 100 mL/hr at 04/09/13 0149

## 2013-04-09 NOTE — Care Management Note (Signed)
   CARE MANAGEMENT NOTE 04/09/2013  Patient:  JESSAH, DANSER   Account Number:  [id number]  Date Initiated:  04/09/2013  Documentation initiated by:  Deshaun Weisinger  Subjective/Objective Assessment:   57 yo female admitted with hyperglycemia. Pt transferred to [facility] from [facility]     Action/Plan:   Behavioral Health   Anticipated DC Date:  04/09/2013   Anticipated DC Plan:  [facility]  In-house referral  Clinical Social Worker      DC Planning Services  CM consult      Choice offered to / List presented to:  NA   DME arranged  NA      DME agency  NA     HH arranged  NA      HH agency  NA   Status of service:  Completed, signed off Medicare Important Message given?   (If response is "NO", the following Medicare IM given date fields will be blank) Date Medicare IM given:   Date Additional Medicare IM given:    Discharge Disposition:    Per UR Regulation:  Reviewed for med. necessity/level of care/duration of stay  If discussed at Long Length of Stay Meetings, dates discussed:    Comments:  06/09/12 1041 Lillan Mccreadie,RN,MSN [phone] Chart reviewed for utilization of services. No needs identified.
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
## 2013-04-09 NOTE — Progress Notes (Signed)
Clinical Social Work  Patient accepted to [facility] room 505-1. RN to call report to [phone]. [facility] wants to accept patient around 1530. CSW coordinated transportation via [employer]. Patient signed voluntary form which was faxed to [facility]. CSW is signing off but available if needed.  [place], [place] [phone]

## 2013-04-10 DIAGNOSIS — F332 Major depressive disorder, recurrent severe without psychotic features: Principal | ICD-10-CM

## 2013-04-10 DIAGNOSIS — F1994 Other psychoactive substance use, unspecified with psychoactive substance-induced mood disorder: Secondary | ICD-10-CM

## 2013-04-10 DIAGNOSIS — F418 Other specified anxiety disorders: Secondary | ICD-10-CM | POA: Diagnosis present

## 2013-04-10 DIAGNOSIS — R45851 Suicidal ideations: Secondary | ICD-10-CM

## 2013-04-10 DIAGNOSIS — F1410 Cocaine abuse, uncomplicated: Secondary | ICD-10-CM

## 2013-04-10 DIAGNOSIS — F259 Schizoaffective disorder, unspecified: Secondary | ICD-10-CM

## 2013-04-10 LAB — GLUCOSE, CAPILLARY
Glucose-Capillary: 472 mg/dL — ABNORMAL HIGH (ref 70–99)
Glucose-Capillary: 218 mg/dL — ABNORMAL HIGH (ref 70–99)

## 2013-04-10 MED ORDER — INSULIN ASPART 100 UNIT/ML ~~LOC~~ SOLN
20.0000 [IU] | Freq: Once | SUBCUTANEOUS | Status: AC
  Administered 2013-04-10: 20 [IU] via SUBCUTANEOUS

## 2013-04-10 MED ORDER — INSULIN GLARGINE 100 UNIT/ML ~~LOC~~ SOLN
28.0000 [IU] | Freq: Every day | SUBCUTANEOUS | Status: DC
  Administered 2013-04-10 – 2013-04-12 (×3): 28 [IU] via SUBCUTANEOUS

## 2013-04-10 MED ORDER — GABAPENTIN 100 MG PO CAPS
200.0000 mg | ORAL_CAPSULE | Freq: Three times a day (TID) | ORAL | Status: DC
  Administered 2013-04-10 – 2013-04-12 (×8): 200 mg via ORAL
  Filled 2013-04-10 (×12): qty 2

## 2013-04-10 MED ORDER — NICOTINE 14 MG/24HR TD PT24
14.0000 mg | MEDICATED_PATCH | Freq: Every day | TRANSDERMAL | Status: DC
  Administered 2013-04-10 – 2013-04-12 (×3): 14 mg via TRANSDERMAL
  Filled 2013-04-10 (×9): qty 1

## 2013-04-10 MED ORDER — INSULIN ASPART 100 UNIT/ML ~~LOC~~ SOLN
15.0000 [IU] | Freq: Once | SUBCUTANEOUS | Status: AC
  Administered 2013-04-10: 15 [IU] via SUBCUTANEOUS

## 2013-04-10 MED ORDER — LEVOTHYROXINE SODIUM 150 MCG PO TABS
300.0000 ug | ORAL_TABLET | Freq: Every day | ORAL | Status: DC
  Administered 2013-04-11 – 2013-04-13 (×3): 300 ug via ORAL
  Filled 2013-04-10 (×4): qty 2
  Filled 2013-04-10: qty 4
  Filled 2013-04-10: qty 2

## 2013-04-10 NOTE — Progress Notes (Signed)
Patient ID: [name], female   DOB: 1956/04/24, 57 y.o.   MRN: [id number] Patient's CBG 470 @ 1340.  PA notified.  Patient remains on close observation for safety.

## 2013-04-10 NOTE — [facility] Group Notes (Signed)
[facility] LCSW Group Therapy  04/10/2013 2:49 PM  Type of Therapy:  Group Therapy  Participation Level:  Did Not Attend  Smart, Herbert Seta 04/10/2013, 2:49 PM

## 2013-04-10 NOTE — Progress Notes (Signed)
CRITICAL VALUE ALERT  Critical value received:  1255 hrs cbg 503  Date of notification:  04/10/13  Time of notification:  1255  Critical value read back:yes  Nurse who received alert:  Brayton El  MD/PA notified (verbal order): Verne Spurr, PA  Time of notification:  1257  Responding MD/PA:  Verne Spurr, PA  Time MD responded:  1300

## 2013-04-10 NOTE — [facility] Group Notes (Addendum)
[facility] LCSW Aftercare Discharge Planning Group Note   04/10/2013 8:45 AM  Participation Quality:  Alert and Appropriate   Mood/Affect:  Appropriate and Calm  Depression Rating:  9  Anxiety Rating:  10  Thoughts of Suicide:  Pt denies HI, endorses SI  Will you contract for safety?   Yes  Current AVH:  Pt denies  Plan for Discharge/Comments:  Pt attended discharge planning group and actively participated in group.  CSW provided pt with today's workbook.  Pt reports not doing well today.  Pt states that she went to the hospital for high blood pressure and sugar.  Pt states that she was able to do well for 9 months but is now so depressed and down.  Pt states that she doesn't see a purpose to live and wants to die.  Pt discussed her main stressor being her fiance, but reports he will be moving out.  Pt can return home in [place] and will follow up at [facility] for mediation management and CST intake assessment. No further needs voiced by pt at this time.     Transportation Means: Pt reports access to transportation  Supports: No supports mentioned at this time  Reyes Ivan, LCSW 04/10/2013 9:48 AM
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
## 2013-04-10 NOTE — Progress Notes (Signed)
Patient ID: [name], female   DOB: 04-29-1956, 57 y.o.   MRN: [id number] CBG 371 @ 1410.  PA notified.  Patient remains on close observation for safety.

## 2013-04-10 NOTE — [facility] Counselor (Signed)
Pt was admitted to the medical unit and readmitted here yesterday.  PSA completed on 04/07/13 and suicide prevention education was completed on 04/07/13 with pt's sister.  See previous notes.    Reyes Ivan, LCSW 04/10/2013  9:45 AM

## 2013-04-10 NOTE — Progress Notes (Signed)
Psychoeducational Group Note  Date:  04/10/2013 Time:  2000  Group Topic/Focus:  Wrap-Up Group:   The focus of this group is to help patients review their daily goal of treatment and discuss progress on daily workbooks.  Participation Level: Did Not Attend  Participation Quality:  Not Applicable  Affect:  Not Applicable  Cognitive:  Not Applicable  Insight:  Not Applicable  Engagement in Group: Not Applicable  Additional Comments:  The patient did not attend group since she was asleep.   [name] 04/10/2013, 10:04 PM

## 2013-04-10 NOTE — Progress Notes (Signed)
Patient ID: [name], female   DOB: 10-21-1955, 57 y.o.   MRN: [id number]  D:  Pt +ve for AVH- [name] is talking to pt, but not encouraging pt to hurt self, in fact [name] tries to deter pt from committing suicide.Pt denies SI/HI. Pt is pleasant and cooperative. Pt states [name] is there all the time, it's just she can blocker her out sometimes.  A: Pt was offered support and encouragement. Pt was given scheduled medications. Pt was encourage to attend groups. Q 15 minute checks were done for safety.   R:. Pt is taking medication. Pt has no complaints.Pt receptive to treatment and safety maintained on unit.

## 2013-04-10 NOTE — Tx Team (Signed)
Interdisciplinary Treatment Plan Update (Adult)  Date: 04/10/2013  Time Reviewed:  9:45 AM  Progress in Treatment: Attending groups: Yes Participating in groups:  Yes Taking medication as prescribed:  Yes Tolerating medication:  Yes Family/Significant othe contact made: Yes Patient understands diagnosis:  Yes Discussing patient identified problems/goals with staff:  Yes Medical problems stabilized or resolved:  Yes Denies suicidal/homicidal ideation: endorses SI today Issues/concerns per patient self-inventory:  Yes Other:  New problem(s) identified: N/A  Discharge Plan or Barriers: Pt has follow up scheduled at [facility] for medication management and CST intake assessment for added support.    Reason for Continuation of Hospitalization: Anxiety Suicidal Ideation Depression Medication Stabilization  Comments: N/A  Estimated length of stay: 3-5 days  For review of initial/current patient goals, please see plan of care.  Attendees: Patient:     Family:     Physician:  Dr. Javier Glazier 04/10/2013 10:19 AM   Nursing:   Harold Barban, RN 04/10/2013 10:19 AM   Clinical Social Worker:  Reyes Ivan, LCSW 04/10/2013 10:19 AM   Other: Verne Spurr, PA 04/10/2013 10:19 AM   Other:  Frankey Shown, MA care coordination 04/10/2013 10:19 AM   Other:  Juline Patch, LCSW 04/10/2013 10:19 AM   Other:  Leighton Parody, RN 04/10/2013 10:20 AM   Other:    Other:    Other:    Other:    Other:    Other:     Scribe for Treatment Team:   Carmina Miller, 04/10/2013 10:19 AM

## 2013-04-10 NOTE — Progress Notes (Signed)
CRITICAL VALUE ALERT  Critical value received:  1200  Date of notification:  04/10/2013  Time of notification:  1200  Critical value read back:yes  Nurse who received alert:  Charnise Lovan  MD notified (in person):  Dr. Doreene Nest  Time of verbal notification:  1203 hrs   Responding MD:  Doreene Nest  Time MD responded:  1205 hrs  Verbal order to give 20 units of Novolog and recheck cbg and notify physician

## 2013-04-10 NOTE — Progress Notes (Signed)
Patient ID: [name], female   DOB: 02-16-1956, 57 y.o.   MRN: [id number] Patient CBG @1510  340.  PA notified.  Patient remains close observation per PA.

## 2013-04-10 NOTE — Progress Notes (Signed)
Patient ID: [name], female   DOB: Apr 14, 1956, 57 y.o.   MRN: [id number] Patient's CBG 313.  PA notified.  Patient remains on close observation for safety.

## 2013-04-10 NOTE — Progress Notes (Signed)
D: Patient denies HI and A/V hallucinations and reports thoughts SI; patient reports sleep is poor; reports energy level is low ; reports ability to pay attention is poor; rates depression as 10/10; rates hopelessness 10/10; rates anxiety as 8/10; patient asked " How can I stop this feeling of suicide?"  A: Monitored q 15 minutes; patient encouraged to attend groups; patient educated about medications; patient given medications per physician orders; patient encouraged to express feelings and/or concerns  R: Patient is animated and cooperative; physician and physician assistant is aware of the patient's complaints of pain; patient is pleasant and appropriate to circumstances; patient's interaction with staff and peers is appropriate; patient was able to set goal to talk with staff 1:1 when having feelings of SI; patient is taking medications as prescribed and tolerating medications; patient is attending some groups and engaging

## 2013-04-10 NOTE — Progress Notes (Signed)
Patient had an elevated blood sugar and the two previous notes show the values at 1200hrs, 1253hrs, and 1255 hrs; cbg was rechecked at 1253hrs that showed 519 and rechecked at 1255hrs that showed a cbg level of 503; All lab values reported to physician and physician assistant and orders received; patient is now on close observation and reporting a headache and physician Mashburn was made aware; patient is now on close observation and cbg levels are to be checked every 30 minutes for the next 4 hours and the initial cbg check was started at 1340 hrs and the blood sugar is 470 and PA Mashburn made aware by the C. Beaudry RN; patient is restricted on the unit during meals at this time; please also see care instruction orders for further instruction

## 2013-04-10 NOTE — [facility] Suicide Risk Assessment (Signed)
Suicide Risk Assessment  Admission Assessment     Nursing information obtained from:    Demographic factors:    Current Mental Status:    Loss Factors:    Historical Factors:    Risk Reduction Factors:     CLINICAL FACTORS:   Severe Anxiety and/or Agitation Depression:   Anhedonia Hopelessness Impulsivity Insomnia Recent sense of peace/wellbeing Severe Alcohol/Substance Abuse/Dependencies Schizophrenia:   Paranoid or undifferentiated type Chronic Pain Unstable or Poor Therapeutic Relationship Previous Psychiatric Diagnoses and Treatments Medical Diagnoses and Treatments/Surgeries  COGNITIVE FEATURES THAT CONTRIBUTE TO RISK:  Closed-mindedness Loss of executive function Polarized thinking Thought constriction (tunnel vision)    SUICIDE RISK:   Moderate:  Frequent suicidal ideation with limited intensity, and duration, some specificity in terms of plans, no associated intent, good self-control, limited dysphoria/symptomatology, some risk factors present, and identifiable protective factors, including available and accessible social support.  PLAN OF CARE: Admitted for crisis stabilization, safety monitoring the medication management.  I certify that inpatient services furnished can reasonably be expected to improve the patient's condition.   [name],JANARDHAHA R. 04/10/2013, 12:28 PM

## 2013-04-11 LAB — GLUCOSE, CAPILLARY
Glucose-Capillary: 375 mg/dL — ABNORMAL HIGH (ref 70–99)
Glucose-Capillary: 355 mg/dL — ABNORMAL HIGH (ref 70–99)

## 2013-04-11 NOTE — Progress Notes (Signed)
D [name] is seen OOB [facility]AL on the 500 hall. She interacts appropriately with the staff and with her peers. She is flat and blunted, but able to carry on logical conversation. She attends her groups and pays attention to the discussions.    SHe is surprised whr

## 2013-04-11 NOTE — Progress Notes (Signed)
[facility] Group Notes:  (Nursing/MHT/Case Management/Adjunct)  Date:  04/11/2013  Time:  8:00 p.m.   Type of Therapy:  Psychoeducational Skills  Participation Level:  Active  Participation Quality:  Monopolizing  Affect:  Irritable  Cognitive:  Appropriate  Insight:  Improving  Engagement in Group:  Improving  Modes of Intervention:  Education  Summary of Progress/Problems: The patient shared with the group that she had a good day overall. She explained that she was upset over not being prescribed the correct medication. The patient maintained that her doctor did not prescribe the correct eye drops for her. In addition, she verbalized that she felt "nervous" throughout much of the day. As a theme for the day, her coping skill is to "be reasonable".   [name] 04/11/2013, 9:39 PM

## 2013-04-11 NOTE — [facility] Group Notes (Signed)
[facility] Group Notes:  (Clinical Social Work)  04/11/2013   3:00-4:00PM  Summary of Progress/Problems:   The main focus of today's process group was for the patient to identify ways in which they have sabotaged their own mental health wellness/recovery.  Motivational interviewing was used to explore the reasons they engage in this behavior, and reasons they may have for wanting to change.  The Stages of Change were explained to the group using a handout, and patients identified where they are with regard to changing self-defeating behaviors.  The patient expressed that she isolates/withdraws to her room and to her bed, watching TV (even two shows at a time) while shutting everything else out, eating snacks, not leaving the house at all.  She said this empties her mind of her problems and her anxiety; however, she knows that it is not healthy for her and she needs to go out more.  She talked about a shopping trip she made with a friend who did not know about her anxiety.  She was very uncomfortable but acknowledged that she could become more comfortable with repetition.  Type of Therapy:  Process Group  Participation Level:  Active  Participation Quality:  Attentive, Sharing and Supportive  Affect:  Blunted and Depressed  Cognitive:  Appropriate and Oriented  Insight:  Engaged  Engagement in Therapy:  Engaged  Modes of Intervention:  Education, Motivational Interviewing   Ambrose Mantle, LCSW 04/11/2013, 4:00pm

## 2013-04-11 NOTE — Progress Notes (Signed)
[facility] MD Progress Note  04/11/2013 1:04 PM [name]  MRN:  [id number]  Subjective:  Patient is seen and chart reviewed. Patient has been participating in unit activities and has been compliant with her medication. Patient reported she has no symptoms of depression or suicidal ideation today and wanted to be discharged. Patient has making poor choices which she increases her blood sugar and blood pressure uncomfortable at this time. Patient is willing to get medication adjustment. Patient appeared anxious and somewhat depressed. Patient was not able to go with peer to [facility] as she's not making good choices.    E. and and a range in this Diagnosis:   DSM5: Schizophrenia Disorders:  Schizophrenia (295.7) Obsessive-Compulsive Disorders:   Trauma-Stressor Disorders:   Substance/Addictive Disorders:   Depressive Disorders:  Major Depressive Disorder - Severe (296.23)  Axis I: Major Depression, Recurrent severe, Schizoaffective Disorder, Substance Induced Mood Disorder and Cocaine abuse  ADL's:  Intact  Sleep: Fair  Appetite:  Fair  Suicidal Ideation:  Patient endorses suicidal ideation and contracts for safety in the hospital Homicidal Ideation:  Denied AEB (as evidenced by):  Psychiatric Specialty Exam: ROS  Blood pressure 155/92, pulse 75, temperature 97.5 F (36.4 C), temperature source Oral, resp. rate 18, height 5' 2.5" (1.588 m), weight 89.812 kg (198 lb), SpO2 94.00%.Body mass index is 35.62 kg/(m^2).  General Appearance: Fairly Groomed  Patent attorney::  Fair  Speech:  Clear and Coherent and Normal Rate  Volume:  Normal  Mood:  Anxious, Depressed, Hopeless and Worthless  Affect:  Depressed and Flat  Thought Process:  Goal Directed and Intact  Orientation:  Full (Time, Place, and Person)  Thought Content:  Rumination  Suicidal Thoughts:  Yes.  without intent/plan  Homicidal Thoughts:  No  Memory:  Immediate;   Fair  Judgement:  Impaired  Insight:  Lacking   Psychomotor Activity:  Psychomotor Retardation  Concentration:  Fair  Recall:  Fair  Akathisia:  NA  Handed:  Right  AIMS (if indicated):     Assets:  Communication Skills Desire for Improvement Financial Resources/Insurance Physical Health Resilience  Sleep:  Number of Hours: 6.75   Current Medications: Current Facility-Administered Medications  Medication Dose Route Frequency Provider Last Rate Last Dose  . acetaminophen (TYLENOL) tablet 650 mg  650 mg Oral Q6H PRN Cleotis Nipper, MD      . albuterol (PROVENTIL HFA;VENTOLIN HFA) 108 (90 BASE) MCG/ACT inhaler 2 puff  2 puff Inhalation Q6H PRN Cleotis Nipper, MD      . allopurinol (ZYLOPRIM) tablet 300 mg  300 mg Oral Daily Cleotis Nipper, MD   300 mg at 04/11/13 0827  . alum & mag hydroxide-simeth (MAALOX/MYLANTA) 200-200-20 MG/5ML suspension 30 mL  30 mL Oral Q4H PRN Cleotis Nipper, MD      . amLODipine (NORVASC) tablet 5 mg  5 mg Oral Daily Cleotis Nipper, MD   5 mg at 04/11/13 0827  . aspirin EC tablet 81 mg  81 mg Oral Daily Cleotis Nipper, MD   81 mg at 04/11/13 0826  . atorvastatin (LIPITOR) tablet 80 mg  80 mg Oral q1800 Cleotis Nipper, MD   80 mg at 04/10/13 1649  . brimonidine (ALPHAGAN) 0.2 % ophthalmic solution 1 drop  1 drop Both Eyes BID Cleotis Nipper, MD   1 drop at 04/11/13 0800  . colchicine tablet 0.6 mg  0.6 mg Oral Daily PRN Cleotis Nipper, MD      . gabapentin (  NEURONTIN) capsule 200 mg  200 mg Oral TID Nehemiah Settle, MD   200 mg at 04/11/13 1250  . haloperidol (HALDOL) tablet 5 mg  5 mg Oral QHS Cleotis Nipper, MD   5 mg at 04/10/13 2154  . hydrOXYzine (ATARAX/VISTARIL) tablet 25 mg  25 mg Oral Q6H PRN Cleotis Nipper, MD   25 mg at 04/11/13 1610  . insulin aspart (novoLOG) injection 0-20 Units  0-20 Units Subcutaneous TID WC Cleotis Nipper, MD   7 Units at 04/11/13 0631  . insulin aspart (novoLOG) injection 0-5 Units  0-5 Units Subcutaneous QHS Cleotis Nipper, MD   3 Units at 04/09/13 2132  . insulin glargine  (LANTUS) injection 28 Units  28 Units Subcutaneous QHS Verne Spurr, PA-C   28 Units at 04/10/13 2153  . levothyroxine (SYNTHROID, LEVOTHROID) tablet 300 mcg  300 mcg Oral QAC breakfast Verne Spurr, PA-C   300 mcg at 04/11/13 0631  . magnesium hydroxide (MILK OF MAGNESIA) suspension 30 mL  30 mL Oral Daily PRN Cleotis Nipper, MD      . mometasone-formoterol (DULERA) 100-5 MCG/ACT inhaler 2 puff  2 puff Inhalation BID Cleotis Nipper, MD   2 puff at 04/11/13 0826  . nicotine (NICODERM CQ - dosed in mg/24 hours) patch 14 mg  14 mg Transdermal Daily Nehemiah Settle, MD   14 mg at 04/11/13 0827  . ondansetron (ZOFRAN-ODT) disintegrating tablet 4 mg  4 mg Oral Q8H PRN Cleotis Nipper, MD      . pantoprazole (PROTONIX) EC tablet 40 mg  40 mg Oral Daily Cleotis Nipper, MD   40 mg at 04/11/13 0827  . sertraline (ZOLOFT) tablet 150 mg  150 mg Oral Daily Cleotis Nipper, MD   150 mg at 04/11/13 0827  . traZODone (DESYREL) tablet 100 mg  100 mg Oral QHS Cleotis Nipper, MD   100 mg at 04/10/13 2149    Lab Results:  Results for orders placed during the hospital encounter of 04/09/13 (from the past 48 hour(s))  GLUCOSE, CAPILLARY     Status: Abnormal   Collection Time    04/09/13  7:22 PM      Result Value Range   Glucose-Capillary 229 (*) 70 - 99 mg/dL  GLUCOSE, CAPILLARY     Status: Abnormal   Collection Time    04/09/13  9:11 PM      Result Value Range   Glucose-Capillary 262 (*) 70 - 99 mg/dL  GLUCOSE, CAPILLARY     Status: Abnormal   Collection Time    04/10/13  6:20 AM      Result Value Range   Glucose-Capillary 185 (*) 70 - 99 mg/dL  GLUCOSE, CAPILLARY     Status: Abnormal   Collection Time    04/10/13 11:52 AM      Result Value Range   Glucose-Capillary 472 (*) 70 - 99 mg/dL   Comment 1 Notify RN    GLUCOSE, CAPILLARY     Status: Abnormal   Collection Time    04/10/13 12:53 PM      Result Value Range   Glucose-Capillary 519 (*) 70 - 99 mg/dL   Comment 1 Repeat Test    GLUCOSE,  CAPILLARY     Status: Abnormal   Collection Time    04/10/13 12:55 PM      Result Value Range   Glucose-Capillary 503 (*) 70 - 99 mg/dL   Comment 1 Call MD NNP PA CNM  GLUCOSE, CAPILLARY     Status: Abnormal   Collection Time    04/10/13  4:46 PM      Result Value Range   Glucose-Capillary 218 (*) 70 - 99 mg/dL  GLUCOSE, CAPILLARY     Status: Abnormal   Collection Time    04/11/13 12:02 PM      Result Value Range   Glucose-Capillary 375 (*) 70 - 99 mg/dL   Comment 1 Notify RN      Physical Findings: AIMS: Facial and Oral Movements Muscles of Facial Expression: None, normal Lips and Perioral Area: None, normal Jaw: None, normal Tongue: None, normal,Extremity Movements Upper (arms, wrists, hands, fingers): None, normal Lower (legs, knees, ankles, toes): None, normal,  , Overall Severity Severity of abnormal movements (highest score from questions above): None, normal Incapacitation due to abnormal movements: None, normal, Dental Status Current problems with teeth and/or dentures?: No Does patient usually wear dentures?: No  CIWA:    COWS:     Treatment Plan Summary: Daily contact with patient to assess and evaluate symptoms and progress in treatment Medication management  Plan: Treatment Plan/Recommendations:   1. Admit for crisis management and stabilization. 2. Medication management to reduce current symptoms to base line and improve the patient's overall level of functioning. Continue Neurontin, Zoloft, trazodone and Haldol with benztropine 3. Treat health problems as indicated. 4. Develop treatment plan to decrease risk of relapse upon discharge and to reduce the need for readmission. 5. Psycho-social education regarding relapse prevention and self care. 6. Health care follow up as needed for medical problems. 7. Restart home medications where appropriate.   Medical Decision Making Problem Points:  Established problem, worsening (2), New problem, with additional  work-up planned (4), Review of last therapy session (1) and Review of psycho-social stressors (1) Data Points:  Review or order clinical lab tests (1) Review or order medicine tests (1) Review of medication regiment & side effects (2) Review of new medications or change in dosage (2)  I certify that inpatient services furnished can reasonably be expected to improve the patient's condition.   Kennard Fildes,Maria R. 04/11/2013, 1:04 PM

## 2013-04-12 LAB — GLUCOSE, CAPILLARY: Glucose-Capillary: 196 mg/dL — ABNORMAL HIGH (ref 70–99)

## 2013-04-12 MED ORDER — HYDROXYZINE HCL 50 MG PO TABS
50.0000 mg | ORAL_TABLET | Freq: Four times a day (QID) | ORAL | Status: DC | PRN
  Administered 2013-04-12: 50 mg via ORAL
  Filled 2013-04-12: qty 1

## 2013-04-12 MED ORDER — GABAPENTIN 300 MG PO CAPS
300.0000 mg | ORAL_CAPSULE | Freq: Three times a day (TID) | ORAL | Status: DC
  Administered 2013-04-13 (×2): 300 mg via ORAL
  Filled 2013-04-12 (×8): qty 1

## 2013-04-12 MED ORDER — LATANOPROST 0.005 % OP SOLN
1.0000 [drp] | Freq: Every day | OPHTHALMIC | Status: DC
  Administered 2013-04-12: 1 [drp] via OPHTHALMIC
  Filled 2013-04-12: qty 2.5

## 2013-04-12 NOTE — Progress Notes (Signed)
Wrier has observed patient up in the dayroom with minimal interaction with peers. Writer spoke with patient 1:1 and she was upset because her eye drops were not ordered correctly. Writer informed patient that a not e was sent to pharmacy requesting change of times. Patient was also concerned because her depakote was not ordered and writer encouraged patient to speak with doctor concerning this matter to see if it was an oversite or a change in her medications. Patient requeste her hs medications early so she could lay down. Patient reports that she plans to stay on her medications once discharged and visit with family in [place]. Patient reports also that her fiance has moved out because he was causing her a great deal of stress. Writer offered patient support and encouragement. Safety maintained on unit with 15 min checks. Patient compliant with POC.

## 2013-04-12 NOTE — Progress Notes (Signed)
Psychoeducational Group Note  Date:  04/12/2013 Time:  2000  Group Topic/Focus:  Wrap-Up Group:   The focus of this group is to help patients review their daily goal of treatment and discuss progress on daily workbooks.  Participation Level: Did Not Attend  Participation Quality:  Not Applicable  Affect:  Not Applicable  Cognitive:  Not Applicable  Insight:  Not Applicable  Engagement in Group: Not Applicable  Additional Comments:  The patient didn't attend group since she was sleeping in her room.   [name] 04/12/2013, 11:55 PM

## 2013-04-12 NOTE — Progress Notes (Addendum)
D:  Patient's self inventory sheet, patient sleeps well, good appetite, normal energy level, good attention span.  Rated depression and hopeless #1.  Denied withdrawals.  Denied SI.  Has experienced back pain in past 24 hours.  Worst pain #8, zero pain goal.  After discharge, plans to take medications and go out more.  Does have discharge plans.  No problems taking meds after discharge. A:  Medications administered per MD orders.  Emotional support and encouragement given patient. R:  Denied SI and HI.   Denied A/V hallucinations.  Denied pain.  Will continue to monitor patient for safety with 15 minute checks.  Safety maintained.  Patient stated she wants trazadone and vistaril increased because of decreased sleep.  Stated she wants neurotin increase because of back pain.  Will discuss medications with MD.

## 2013-04-12 NOTE — Progress Notes (Signed)
Psychoeducational Group Note  Psychoeducational Group Note  Date: 04/12/2013 Time:  0930 Group Topic/Focus:  Gratefulness:  The focus of this group is to help patients identify what two things they are most grateful for in their lives. What helps ground them and to center them on their work to their recovery.  Participation Level:  Active  Participation Quality:  Appropriate  Affect:  Appropriate  Cognitive:  Oriented  Insight:  improving  Engagement in Group:  Engaged  Additional Comments:    Dione Housekeeper

## 2013-04-12 NOTE — Progress Notes (Signed)
CBG at 1700 was 370

## 2013-04-12 NOTE — Progress Notes (Signed)
Psychoeducational Group Note  Date:  04/12/2013 Time:  1015  Group Topic/Focus:  Making Healthy Choices:   The focus of this group is to help patients identify negative/unhealthy choices they were using prior to admission and identify positive/healthier coping strategies to replace them upon discharge.  Participation Level:  Active  Participation Quality:  Appropriate  Affect:  Appropriate  Cognitive:  Oriented  Insight:  Partisipating  Engagement in Group:  Engaged  Additional Comments:  Virginia Curl A 04/12/2013  

## 2013-04-12 NOTE — Progress Notes (Signed)
Patient ID: [name], female   DOB: 1955/11/15, 57 y.o.   MRN: [id number] [facility] MD Progress Note  04/12/2013 5:38 PM [name]  MRN:  [id number]  Subjective:  Patient is complaining about anxiety depression and back pain. Patient is concerned about her blood glucose levels are not under control. Patient is not making good choices about her diet.  Patient has been participating in unit activities and has been compliant with her medication. Patient has making poor choices which she increases her blood sugar and blood pressure uncomfortable at this time. Patient requested to change her medication for better control of her symptoms.     E. and and a range in this Diagnosis:   DSM5: Schizophrenia Disorders:  Schizophrenia (295.7) Obsessive-Compulsive Disorders:   Trauma-Stressor Disorders:   Substance/Addictive Disorders:   Depressive Disorders:  Major Depressive Disorder - Severe (296.23)  Axis I: Major Depression, Recurrent severe, Schizoaffective Disorder, Substance Induced Mood Disorder and Cocaine abuse  ADL's:  Intact  Sleep: Fair  Appetite:  Fair  Suicidal Ideation:  Patient endorses suicidal ideation and contracts for safety in the hospital Homicidal Ideation:  Denied AEB (as evidenced by):  Psychiatric Specialty Exam: ROS  Blood pressure 139/84, pulse 94, temperature 97.4 F (36.3 C), temperature source Oral, resp. rate 18, height 5' 2.5" (1.588 m), weight 89.812 kg (198 lb), SpO2 94.00%.Body mass index is 35.62 kg/(m^2).  General Appearance: Fairly Groomed  Patent attorney::  Fair  Speech:  Clear and Coherent and Normal Rate  Volume:  Normal  Mood:  Anxious, Depressed, Hopeless and Worthless  Affect:  Depressed and Flat  Thought Process:  Goal Directed and Intact  Orientation:  Full (Time, Place, and Person)  Thought Content:  Rumination  Suicidal Thoughts:  Yes.  without intent/plan  Homicidal Thoughts:  No  Memory:  Immediate;   Fair  Judgement:  Impaired   Insight:  Lacking  Psychomotor Activity:  Psychomotor Retardation  Concentration:  Fair  Recall:  Fair  Akathisia:  NA  Handed:  Right  AIMS (if indicated):     Assets:  Communication Skills Desire for Improvement Financial Resources/Insurance Physical Health Resilience  Sleep:  Number of Hours: 6   Current Medications: Current Facility-Administered Medications  Medication Dose Route Frequency Provider Last Rate Last Dose  . acetaminophen (TYLENOL) tablet 650 mg  650 mg Oral Q6H PRN Cleotis Nipper, MD   650 mg at 04/11/13 2046  . albuterol (PROVENTIL HFA;VENTOLIN HFA) 108 (90 BASE) MCG/ACT inhaler 2 puff  2 puff Inhalation Q6H PRN Cleotis Nipper, MD      . allopurinol (ZYLOPRIM) tablet 300 mg  300 mg Oral Daily Cleotis Nipper, MD   300 mg at 04/12/13 0814  . alum & mag hydroxide-simeth (MAALOX/MYLANTA) 200-200-20 MG/5ML suspension 30 mL  30 mL Oral Q4H PRN Cleotis Nipper, MD      . amLODipine (NORVASC) tablet 5 mg  5 mg Oral Daily Cleotis Nipper, MD   5 mg at 04/12/13 7829  . aspirin EC tablet 81 mg  81 mg Oral Daily Cleotis Nipper, MD   81 mg at 04/12/13 5621  . atorvastatin (LIPITOR) tablet 80 mg  80 mg Oral q1800 Cleotis Nipper, MD   80 mg at 04/12/13 1731  . brimonidine (ALPHAGAN) 0.2 % ophthalmic solution 1 drop  1 drop Both Eyes BID Cleotis Nipper, MD   1 drop at 04/12/13 0820  . colchicine tablet 0.6 mg  0.6 mg Oral Daily PRN  Cleotis Nipper, MD      . Melene Muller ON 04/13/2013] gabapentin (NEURONTIN) capsule 300 mg  300 mg Oral TID Nehemiah Settle, MD      . haloperidol (HALDOL) tablet 5 mg  5 mg Oral QHS Cleotis Nipper, MD   5 mg at 04/11/13 2123  . hydrOXYzine (ATARAX/VISTARIL) tablet 50 mg  50 mg Oral Q6H PRN Nehemiah Settle, MD      . insulin aspart (novoLOG) injection 0-20 Units  0-20 Units Subcutaneous TID WC Cleotis Nipper, MD   20 Units at 04/12/13 1733  . insulin aspart (novoLOG) injection 0-5 Units  0-5 Units Subcutaneous QHS Cleotis Nipper, MD   4 Units at  04/11/13 2120  . insulin glargine (LANTUS) injection 28 Units  28 Units Subcutaneous QHS Verne Spurr, PA-C   28 Units at 04/11/13 2121  . latanoprost (XALATAN) 0.005 % ophthalmic solution 1 drop  1 drop Both Eyes QHS Kristeen Mans, NP      . levothyroxine (SYNTHROID, LEVOTHROID) tablet 300 mcg  300 mcg Oral QAC breakfast Verne Spurr, PA-C   300 mcg at 04/12/13 0636  . magnesium hydroxide (MILK OF MAGNESIA) suspension 30 mL  30 mL Oral Daily PRN Cleotis Nipper, MD      . mometasone-formoterol (DULERA) 100-5 MCG/ACT inhaler 2 puff  2 puff Inhalation BID Cleotis Nipper, MD   2 puff at 04/12/13 0812  . nicotine (NICODERM CQ - dosed in mg/24 hours) patch 14 mg  14 mg Transdermal Daily Nehemiah Settle, MD   14 mg at 04/12/13 1610  . ondansetron (ZOFRAN-ODT) disintegrating tablet 4 mg  4 mg Oral Q8H PRN Cleotis Nipper, MD      . pantoprazole (PROTONIX) EC tablet 40 mg  40 mg Oral Daily Cleotis Nipper, MD   40 mg at 04/12/13 0815  . sertraline (ZOLOFT) tablet 150 mg  150 mg Oral Daily Cleotis Nipper, MD   150 mg at 04/12/13 9604  . traZODone (DESYREL) tablet 100 mg  100 mg Oral QHS Cleotis Nipper, MD   100 mg at 04/11/13 2120    Lab Results:  Results for orders placed during the hospital encounter of 04/09/13 (from the past 48 hour(s))  GLUCOSE, CAPILLARY     Status: Abnormal   Collection Time    04/11/13 12:02 PM      Result Value Range   Glucose-Capillary 375 (*) 70 - 99 mg/dL   Comment 1 Notify RN    GLUCOSE, CAPILLARY     Status: Abnormal   Collection Time    04/11/13  4:20 PM      Result Value Range   Glucose-Capillary 355 (*) 70 - 99 mg/dL   Comment 1 Notify RN    GLUCOSE, CAPILLARY     Status: Abnormal   Collection Time    04/12/13  6:21 AM      Result Value Range   Glucose-Capillary 196 (*) 70 - 99 mg/dL  GLUCOSE, CAPILLARY     Status: Abnormal   Collection Time    04/12/13 12:17 PM      Result Value Range   Glucose-Capillary 352 (*) 70 - 99 mg/dL   Comment 1 Notify RN       Physical Findings: AIMS: Facial and Oral Movements Muscles of Facial Expression: None, normal Lips and Perioral Area: None, normal Jaw: None, normal Tongue: None, normal,Extremity Movements Upper (arms, wrists, hands, fingers): None, normal Lower (legs, knees, ankles, toes): None,  normal, Trunk Movements Neck, shoulders, hips: None, normal, Overall Severity Severity of abnormal movements (highest score from questions above): None, normal Incapacitation due to abnormal movements: None, normal Patient's awareness of abnormal movements (rate only patient's report): No Awareness, Dental Status Current problems with teeth and/or dentures?: No Does patient usually wear dentures?: No  CIWA:  CIWA-Ar Total: 1 COWS:  COWS Total Score: 2  Treatment Plan Summary: Daily contact with patient to assess and evaluate symptoms and progress in treatment Medication management  Plan: Treatment Plan/Recommendations:   1. Admit for crisis management and stabilization. 2. Medication management to reduce current symptoms to base line and improve the patient's overall level of functioning. Increase Neurontin 300 mg 3 times a day and Vistaril 50 mg every 6 hours as needed and continue, Zoloft, trazodone and Haldol with benztropine 3. Treat health problems as indicated. 4. Develop treatment plan to decrease risk of relapse upon discharge and to reduce the need for readmission. 5. Psycho-social education regarding relapse prevention and self care. 6. Health care follow up as needed for medical problems. 7. Restart home medications where appropriate.   Medical Decision Making Problem Points:  Established problem, worsening (2), New problem, with additional work-up planned (4), Review of last therapy session (1) and Review of psycho-social stressors (1) Data Points:  Review or order clinical lab tests (1) Review or order medicine tests (1) Review of medication regiment & side effects (2) Review of new  medications or change in dosage (2)  I certify that inpatient services furnished can reasonably be expected to improve the patient's condition.   Alona Danford,JANARDHAHA R. 04/12/2013, 5:38 PM

## 2013-04-12 NOTE — [facility] Group Notes (Signed)
[facility] Group Notes:  (Clinical Social Work)  04/12/2013   3-4PM  Summary of Progress/Problems:   The main focus of today's process group was to identify the patient's current support system and decide on other supports that can be put in place.  The picture on workbook was used to discuss why additional supports are needed, and there was an interactive discussion about unhealthy supports and healthy supports.  The patient expressed full comprehension of the concepts presented, and agreed that there is a need to add more supports.  The patient stated the current supports in place are her fiance, family in [place], stepson, therapist and psychiatrist.  She participated heavily in the discussion and was able to contribute to the understanding of other patients.  Type of Therapy:  Process Group  Participation Level:  Active  Participation Quality:  Attentive, Sharing and Supportive  Affect:  Appropriate  Cognitive:  Appropriate and Oriented  Insight:  Engaged  Engagement in Therapy:  Engaged  Modes of Intervention:  Education,  Support and [employer], LCSW 04/12/2013, 4:00pm

## 2013-04-13 LAB — GLUCOSE, CAPILLARY
Glucose-Capillary: 371 mg/dL — ABNORMAL HIGH (ref 70–99)
Glucose-Capillary: 470 mg/dL — ABNORMAL HIGH (ref 70–99)
Glucose-Capillary: 216 mg/dL — ABNORMAL HIGH (ref 70–99)
Glucose-Capillary: 403 mg/dL — ABNORMAL HIGH (ref 70–99)
Glucose-Capillary: 345 mg/dL — ABNORMAL HIGH (ref 70–99)
Glucose-Capillary: 370 mg/dL — ABNORMAL HIGH (ref 70–99)
Glucose-Capillary: 194 mg/dL — ABNORMAL HIGH (ref 70–99)

## 2013-04-13 MED ORDER — FLUTICASONE-SALMETEROL 250-50 MCG/DOSE IN AEPB: 1.0000 | INHALATION_SPRAY | Freq: Two times a day (BID) | RESPIRATORY_TRACT | Status: DC

## 2013-04-13 MED ORDER — GABAPENTIN 100 MG PO CAPS: 100.0000 mg | ORAL_CAPSULE | Freq: Three times a day (TID) | ORAL | Status: DC

## 2013-04-13 MED ORDER — PANTOPRAZOLE SODIUM 40 MG PO TBEC: 40.0000 mg | DELAYED_RELEASE_TABLET | Freq: Every day | ORAL | Status: DC

## 2013-04-13 MED ORDER — LATANOPROST 0.005 % OP SOLN: 1.0000 [drp] | Freq: Every day | OPHTHALMIC | Status: DC

## 2013-04-13 MED ORDER — GABAPENTIN 100 MG PO CAPS: ORAL_CAPSULE | ORAL | Status: DC

## 2013-04-13 MED ORDER — BRIMONIDINE TARTRATE 0.2 % OP SOLN: 1.0000 [drp] | Freq: Two times a day (BID) | OPHTHALMIC | Status: DC

## 2013-04-13 MED ORDER — SERTRALINE HCL 50 MG PO TABS
150.0000 mg | ORAL_TABLET | Freq: Every day | ORAL | Status: DC
  Filled 2013-04-13 (×2): qty 9

## 2013-04-13 MED ORDER — INSULIN GLARGINE 100 UNIT/ML ~~LOC~~ SOLN: 28.0000 [IU] | Freq: Every day | SUBCUTANEOUS | Status: DC

## 2013-04-13 MED ORDER — ASPIRIN EC 81 MG PO TBEC: 81.0000 mg | DELAYED_RELEASE_TABLET | Freq: Every day | ORAL | Status: DC

## 2013-04-13 MED ORDER — ALLOPURINOL 300 MG PO TABS: 300.0000 mg | ORAL_TABLET | Freq: Every day | ORAL | Status: DC

## 2013-04-13 MED ORDER — TRAZODONE HCL 100 MG PO TABS: 100.0000 mg | ORAL_TABLET | Freq: Every day | ORAL | Status: DC

## 2013-04-13 MED ORDER — HALOPERIDOL 5 MG PO TABS: 5.0000 mg | ORAL_TABLET | Freq: Every day | ORAL | Status: DC

## 2013-04-13 MED ORDER — AMLODIPINE BESYLATE 5 MG PO TABS: 5.0000 mg | ORAL_TABLET | Freq: Every day | ORAL | Status: DC

## 2013-04-13 MED ORDER — LEVOTHYROXINE SODIUM 300 MCG PO TABS: 300.0000 ug | ORAL_TABLET | Freq: Every day | ORAL | Status: DC

## 2013-04-13 MED ORDER — MOMETASONE FURO-FORMOTEROL FUM 100-5 MCG/ACT IN AERO: 2.0000 | INHALATION_SPRAY | Freq: Two times a day (BID) | RESPIRATORY_TRACT | Status: AC

## 2013-04-13 MED ORDER — SERTRALINE HCL 50 MG PO TABS: 150.0000 mg | ORAL_TABLET | Freq: Every day | ORAL | Status: DC

## 2013-04-13 MED ORDER — ATORVASTATIN CALCIUM 80 MG PO TABS: 80.0000 mg | ORAL_TABLET | ORAL | Status: DC

## 2013-04-13 NOTE — [facility] Suicide Risk Assessment (Signed)
Suicide Risk Assessment  Discharge Assessment     Demographic Factors:  Adolescent or young adult, Low socioeconomic status and Unemployed  Mental Status Per Nursing Assessment::   On Admission:     Current Mental Status by Physician: Patient was calm quite and cooperative. Patient has no abnormal psychomotor activity. Patient has a good mood with appropriate a bright and full affect. Patient has normal rate rhythm and volume of speech. Patient has denied suicidal, homicidal ideation, intentions and plans. Patient has no evidence of psychotic symptoms. Patient has a fair insight, judgment and impulse control.  Loss Factors: Financial problems/change in socioeconomic status  Historical Factors: Prior suicide attempts, Family history of mental illness or substance abuse and Impulsivity  Risk Reduction Factors:   Sense of responsibility to family, Religious beliefs about death, Living with another person, especially a relative, Positive therapeutic relationship and Positive coping skills or problem solving skills  Continued Clinical Symptoms:  Depression:   Recent sense of peace/wellbeing Alcohol/Substance Abuse/Dependencies Unstable or Poor Therapeutic Relationship Previous Psychiatric Diagnoses and Treatments Medical Diagnoses and Treatments/Surgeries  Cognitive Features That Contribute To Risk:  Polarized thinking    Suicide Risk:  Minimal: No identifiable suicidal ideation.  Patients presenting with no risk factors but with morbid ruminations; may be classified as minimal risk based on the severity of the depressive symptoms  Discharge Diagnoses:   AXIS I:  Chronic Paranoid Schizophrenia, Major Depression, Recurrent severe and Cocaine abuse AXIS II:  Deferred AXIS III:   Past Medical History  Diagnosis Date  . Asthma     past 10 years  . Diabetes mellitus     past 5 years  . Glaucoma     dx 06/2011-[facility]  . Hypertension   . Hyperlipidemia   .  Hyperthyroidism   . History of TIA (transient ischemic attack)     01/06/2011, x2  . Pancreatitis   . Gout   . Depression   . Renal disorder     chronic kidney disease, stage 3  . Chronic pain   . Glaucoma   . Arthritis     djd -lower back, left knee  . Degenerative disc disease   . Bipolar disorder   . Gastritis   . COPD (chronic obstructive pulmonary disease)   . Schizophrenia   . 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 Colon polyps     colonoscopy 01/2013   . Fatty liver     mild CT 06/2012   AXIS IV:  other psychosocial or environmental problems, problems related to social environment and problems with primary support group AXIS V:  61-70 mild symptoms  Plan Of Care/Follow-up recommendations:  Activity:  As tolerated Diet:  Diabetes diet  Is patient on multiple antipsychotic therapies at discharge:  No   Has Patient had three or more failed trials of antipsychotic monotherapy by history:  No  Recommended Plan for Multiple Antipsychotic Therapies: NA  Nehemiah Settle., M.D. 04/13/2013, 11:00 AM

## 2013-04-13 NOTE — Progress Notes (Signed)
[facility] Adult Case Management Discharge Plan :  Will you be returning to the same living situation after discharge: Yes,  returning home At discharge, do you have transportation home?:Yes,  friend will pick pt up Do you have the ability to pay for your medications:Yes,  access to meds  Release of information consent forms completed and in the chart;  Patient's signature needed at discharge.  Patient to Follow up at: Follow-up Information   Follow up with [facility] of Care On 04/15/2013. (Appointment scheduled at 12:30 pm with Ledon Snare for community support team intake assessment)    Contact information:   [address], [place], [place] [address] Phone: [phone] Fax: [phone]      Follow up with [facility] of Care On 04/15/2013. (Appointment scheduled at 1:30 pm with Dr. Midge Aver on this date for medication management.)    Contact information:   [address], [place], [place] [address] Phone: [phone] Fax: [phone]      Patient denies SI/HI:   Yes,  denies SI/HI    Safety Planning and Suicide Prevention discussed:  Yes,  discussed with pt and pt's sister.  See suicide prevention education note.   Carmina Miller 04/13/2013, 9:59 AM

## 2013-04-13 NOTE — Progress Notes (Signed)
Adult Psychoeducational Group Note  Date:  04/13/2013 Time:  11:00am  Group Topic/Focus:  Wellness Toolbox:   The focus of this group is to discuss various aspects of wellness, balancing those aspects and exploring ways to increase the ability to experience wellness.  Patients will create a wellness toolbox for use upon discharge.  Participation Level:  Active  Participation Quality:  Appropriate and Attentive  Affect:  Appropriate  Cognitive:  Alert and Appropriate  Insight: Appropriate  Engagement in Group:  Engaged  Modes of Intervention:  Discussion and Education  Additional Comments:  Pt attended and participated in group. Discussion was on wellness. The question was asked; What does wellness mean to you? Pt stated wellness means physical and mental well being and medication management.  Shelly Bombard D 04/13/2013, 1:10 PM

## 2013-04-13 NOTE — Progress Notes (Signed)
Patient ID: [name], female   DOB: 05/09/1956, 57 y.o.   MRN: [id number]  Patient denies SI/HI and A/V hallucinations. Patient rates her depression and hopelessness at a 1/10 (1-10/10 scale). Writer offered encouragement and support to patient throughout the day until discharge. Patient reports that after discharge she will "take medications right and eat right." Writer explained discharge and medication instructions to patient.Patient verbalized understanding and signed discharge AVS. Patient's belongings were returned to patient and patient signed belongings sheet. Patient does not express any questions or concerns. No signs of distress upon discharge. Q15 minute safety checks were maintained until patient's discharge.

## 2013-04-13 NOTE — Progress Notes (Addendum)
Patient Discharge Instructions:  After Visit Summary (AVS):   Faxed to:  04/13/13 Discharge Summary Note:   Faxed to:  04/13/13 Psychiatric Admission Assessment Note:   Faxed to:  04/13/13 Faxed/Sent to the Next Level Care provider:  04/13/13 Faxed to [facility] of Care @ [phone]  [name], 04/13/2013, 3:53 PM

## 2013-04-13 NOTE — Progress Notes (Signed)
Report received from [facility]. Patient is currently lying in bed asleep with eyes closed and respirations even and unlabored. Patient is safe with 15 min checks.

## 2013-04-13 NOTE — Tx Team (Signed)
Interdisciplinary Treatment Plan Update (Adult)  Date: 04/13/2013  Time Reviewed:  9:45 AM  Progress in Treatment: Attending groups: Yes Participating in groups:  Yes Taking medication as prescribed:  Yes Tolerating medication:  Yes Family/Significant othe contact made: Yes Patient understands diagnosis:  Yes Discussing patient identified problems/goals with staff:  Yes Medical problems stabilized or resolved:  Yes Denies suicidal/homicidal ideation: Yes Issues/concerns per patient self-inventory:  Yes Other:  New problem(s) identified: N/A  Discharge Plan or Barriers: Pt will follow up at [facility] of Care for medication management and CST intake assessment.    Reason for Continuation of Hospitalization: Stable to d/c today  Comments: N/A  Estimated length of stay: D/C today  For review of initial/current patient goals, please see plan of care.  Attendees: Patient:  [name] 04/13/2013 9:57 AM   Family:     Physician:  Dr. Javier Glazier 04/13/2013 9:57 AM   Nursing:   Neill Loft, RN 04/13/2013 9:57 AM   Clinical Social Worker:  Reyes Ivan, LCSW 04/13/2013 9:57 AM   Other: Verne Spurr, PA 04/13/2013 9:57 AM   Other:  Frankey Shown, MA care coordination 04/13/2013 9:57 AM   Other:  Juline Patch, LCSW 04/13/2013 9:57 AM   Other:  Quintella Reichert, RN 04/13/2013 9:58 AM   Other: Marzetta Board, RN 04/13/2013 9:58 AM   Other: Onnie Boer, RN case management  04/13/2013 9:58 AM   Other:    Other:    Other:      Scribe for Treatment Team:   Carmina Miller, 04/13/2013 , 9:57 AM

## 2013-04-13 NOTE — [facility] Group Notes (Signed)
[facility] LCSW Aftercare Discharge Planning Group Note   04/13/2013 8:45 AM  Participation Quality:  Alert and Appropriate   Mood/Affect:  Appropriate and Calm  Depression Rating:  1  Anxiety Rating:  1  Thoughts of Suicide:  Pt denies SI/HI  Will you contract for safety?   Yes  Current AVH:  Pt denies  Plan for Discharge/Comments:  Pt attended discharge planning group and actively participated in group.  CSW provided pt with today's workbook. Pt reports feeling great today and ready to d/c.  Pt states that she plans to stay on her medication and eat better, to help her diabetes.  Pt can return home in [place] and will follow up at [facility] of Care for mediation management and CST intake assessment. No further needs voiced by pt at this time.     Transportation Means: Pt reports access to transportation - reports a friend will pick pt up  Supports: No supports mentioned at this time  Reyes Ivan, LCSW 04/13/2013 9:54 AM

## 2013-04-13 NOTE — Discharge Summary (Signed)
Physician Discharge Summary Note  Patient:  [name] is an 57 y.o., female MRN:  [id number] DOB:  August 14, 1955 Patient phone:  [phone] (home)  Patient address:   [address] [place] [address],   Date of Admission:  04/09/2013 Date of Discharge: 04/13/2013  Reason for Admission:  detox  Discharge Diagnoses: Principal Problem:   Suicidal ideation Active Problems:   Schizophrenia   Cocaine abuse   Depression, major, recurrent  ROS  DSM5: Schizophrenia Disorders: Schizophrenia (295.7)  Obsessive-Compulsive Disorders:  Trauma-Stressor Disorders:  Substance/Addictive Disorders:  Depressive Disorders: Major Depressive Disorder - Severe (296.23)  AXIS I: Major Depression, Recurrent severe, Schizoaffective Disorder, Substance Induced Mood Disorder and cocaine abuse  AXIS II: Deferred  AXIS III:  Past Medical History   Diagnosis  Date   .  Asthma      past 10 years   .  Diabetes mellitus      past 5 years   .  Glaucoma      dx 06/2011-[facility]   .  Hypertension    .  Hyperlipidemia    .  Hyperthyroidism    .  History of TIA (transient ischemic attack)      01/06/2011, x2   .  Pancreatitis    .  Gout    .  Depression    .  Renal disorder      chronic kidney disease, stage 3   .  Chronic pain    .  Glaucoma    .  Arthritis      djd -lower back, left knee   .  Degenerative disc disease    .  Bipolar disorder    .  Gastritis    .  COPD (chronic obstructive pulmonary disease)    .  Schizophrenia    .  Personal history of colonic qdenomas  02/18/2013   .  Colon polyps      colonoscopy 01/2013   .  Fatty liver      mild CT 06/2012    AXIS IV: economic problems, educational problems, housing problems, occupational problems, other psychosocial or environmental problems, problems related to social environment and problems with primary support group  AXIS V: 41-50 serious symptoms   Level of Care:  OP  Hospital Course:  [name] was re admitted to  [facility] from the medical floor where she was admitted for extremely elevated blood sugars while on the [address]. She was originally admitted for opiate and benzodiazepine detox.     Her detox was continued while on the medical floor. When she returned she continued to report somatic symptoms related to her blood sugars and her depression. Each one was addressed and changes in medication and diet were recommended. She was also followed by the diabetic nurse educator.     [name] requested discharge to home and stated that she would be returning to [place] to stay with her family there. She denied SI/HI or AVH. She noted a good reduction to her symptoms of depression which were felt to be related to her upcoming court date for drug trafficking.     [name] was discharged home with the plan to follow up with both the primary care clinic as well as the psychiatric follow up as noted below.  Consults:  None  Significant Diagnostic Studies:  labs: CBGs  Discharge Vitals:   Blood pressure 160/102, pulse 84, temperature 97.4 F (36.3 C), temperature source Oral, resp. rate 16, height 5' 2.5" (1.588  m), weight 89.812 kg (198 lb), SpO2 94.00%. Body mass index is 35.62 kg/(m^2). Lab Results:   Results for orders placed during the hospital encounter of 04/09/13 (from the past 72 hour(s))  GLUCOSE, CAPILLARY     Status: Abnormal   Collection Time    04/10/13 11:52 AM      Result Value Range   Glucose-Capillary 472 (*) 70 - 99 mg/dL   Comment 1 Notify RN    GLUCOSE, CAPILLARY     Status: Abnormal   Collection Time    04/10/13 12:53 PM      Result Value Range   Glucose-Capillary 519 (*) 70 - 99 mg/dL   Comment 1 Repeat Test    GLUCOSE, CAPILLARY     Status: Abnormal   Collection Time    04/10/13 12:55 PM      Result Value Range   Glucose-Capillary 503 (*) 70 - 99 mg/dL   Comment 1 Call MD NNP PA CNM    GLUCOSE, CAPILLARY     Status: Abnormal   Collection Time    04/10/13  4:46 PM      Result Value  Range   Glucose-Capillary 218 (*) 70 - 99 mg/dL  GLUCOSE, CAPILLARY     Status: Abnormal   Collection Time    04/11/13 12:02 PM      Result Value Range   Glucose-Capillary 375 (*) 70 - 99 mg/dL   Comment 1 Notify RN    GLUCOSE, CAPILLARY     Status: Abnormal   Collection Time    04/11/13  4:20 PM      Result Value Range   Glucose-Capillary 355 (*) 70 - 99 mg/dL   Comment 1 Notify RN    GLUCOSE, CAPILLARY     Status: Abnormal   Collection Time    04/12/13  6:21 AM      Result Value Range   Glucose-Capillary 196 (*) 70 - 99 mg/dL  GLUCOSE, CAPILLARY     Status: Abnormal   Collection Time    04/12/13 12:17 PM      Result Value Range   Glucose-Capillary 352 (*) 70 - 99 mg/dL   Comment 1 Notify RN    GLUCOSE, CAPILLARY     Status: Abnormal   Collection Time    04/13/13  6:14 AM      Result Value Range   Glucose-Capillary 194 (*) 70 - 99 mg/dL    Physical Findings: AIMS: Facial and Oral Movements Muscles of Facial Expression: None, normal Lips and Perioral Area: None, normal Jaw: None, normal Tongue: None, normal,Extremity Movements Upper (arms, wrists, hands, fingers): None, normal Lower (legs, knees, ankles, toes): None, normal, Trunk Movements Neck, shoulders, hips: None, normal, Overall Severity Severity of abnormal movements (highest score from questions above): None, normal Incapacitation due to abnormal movements: None, normal Patient's awareness of abnormal movements (rate only patient's report): No Awareness, Dental Status Current problems with teeth and/or dentures?: No Does patient usually wear dentures?: No  CIWA:  CIWA-Ar Total: 1 COWS:  COWS Total Score: 2  Psychiatric Specialty Exam: See Psychiatric Specialty Exam and Suicide Risk Assessment completed by Attending Physician prior to discharge.  Discharge destination:  Home  Is patient on multiple antipsychotic therapies at discharge:  No   Has Patient had three or more failed trials of antipsychotic  monotherapy by history:  No  Recommended Plan for Multiple Antipsychotic Therapies: NA  Discharge Orders   Future Orders Complete By Expires   Diet - low sodium heart healthy  As directed    Discharge instructions  As directed    Comments:     Take all your medications as prescribed by your mental healthcare provider. Report any adverse effects and or reactions from your medicines to your outpatient provider promptly. Patient is instructed and cautioned to not engage in alcohol and or illegal drug use while on prescription medicines. In the event of worsening symptoms, patient is instructed to call the crisis hotline, 911 and or go to the nearest ED for appropriate evaluation and treatment of symptoms. Follow-up with your primary care provider for your other medical issues, concerns and or health care needs.   Increase activity slowly  As directed        Medication List    STOP taking these medications       divalproex 500 MG 24 hr tablet  Commonly known as:  DEPAKOTE ER     hydrOXYzine 25 MG tablet  Commonly known as:  ATARAX/VISTARIL     insulin aspart 100 UNIT/ML injection  Commonly known as:  NOVOLOG     LORazepam 1 MG tablet  Commonly known as:  ATIVAN     OLANZapine 10 MG tablet  Commonly known as:  ZYPREXA     oxyCODONE 5 MG immediate release tablet  Commonly known as:  Oxy IR/ROXICODONE      TAKE these medications     Indication   albuterol 108 (90 BASE) MCG/ACT inhaler  Commonly known as:  PROVENTIL HFA;VENTOLIN HFA  Inhale 2 puffs into the lungs every 6 (six) hours as needed for wheezing.      allopurinol 300 MG tablet  Commonly known as:  ZYLOPRIM  Take 1 tablet (300 mg total) by mouth daily. For gout.   Indication:  Primary Gout     amLODipine 5 MG tablet  Commonly known as:  NORVASC  Take 1 tablet (5 mg total) by mouth daily. For hypertension.   Indication:  High Blood Pressure     aspirin EC 81 MG tablet  Take 1 tablet (81 mg total) by mouth  daily. For platelet aggregation.   Indication:  Blood Clot     atorvastatin 80 MG tablet  Commonly known as:  LIPITOR  Take 1 tablet (80 mg total) by mouth every morning.   Indication:  Type II B Hyperlipidemia     brimonidine 0.2 % ophthalmic solution  Commonly known as:  ALPHAGAN  Place 1 drop into both eyes 2 (two) times daily.   Indication:  Wide-Angle Glaucoma     colchicine 0.6 MG tablet  Take 1 tablet (0.6 mg total) by mouth daily as needed (for gout flare-ups).      Fluticasone-Salmeterol 250-50 MCG/DOSE Aepb  Commonly known as:  ADVAIR DISKUS  Inhale 1 puff into the lungs 2 times daily at 12 noon and 4 pm.   Indication:  Asthma, Chronic Obstructive Lung Disease     gabapentin 100 MG capsule  Commonly known as:  NEURONTIN  Take 1 capsule (100 mg total) by mouth 3 (three) times daily. Neuropathic pain.   Indication:  Agitation, Neuropathic Pain     haloperidol 5 MG tablet  Commonly known as:  HALDOL  Take 1 tablet (5 mg total) by mouth at bedtime. For psychosis and irritability.   Indication:  Psychosis     insulin glargine 100 UNIT/ML injection  Commonly known as:  LANTUS  Inject 0.28 mLs (28 Units total) into the skin at bedtime.   Indication:  Type 2 Diabetes  latanoprost 0.005 % ophthalmic solution  Commonly known as:  XALATAN  Place 1 drop into both eyes at bedtime.   Indication:  Wide-Angle Glaucoma     levothyroxine 300 MCG tablet  Commonly known as:  SYNTHROID, LEVOTHROID  Take 1 tablet (300 mcg total) by mouth daily before breakfast.   Indication:  Underactive Thyroid     mometasone-formoterol 100-5 MCG/ACT Aero  Commonly known as:  DULERA  Inhale 2 puffs into the lungs 2 (two) times daily.   Indication:  Asthma     nitroGLYCERIN 0.4 MG SL tablet  Commonly known as:  NITROSTAT  Place 1 tablet (0.4 mg total) under the tongue every 5 (five) minutes as needed for chest pain.      pantoprazole 40 MG tablet  Commonly known as:  PROTONIX  Take 1  tablet (40 mg total) by mouth daily. For reflux.   Indication:  Gastroesophageal Reflux Disease     sertraline 50 MG tablet  Commonly known as:  ZOLOFT  Take 3 tablets (150 mg total) by mouth daily. For anxiety and depression.   Indication:  Anxiety Disorder, Major Depressive Disorder     traZODone 100 MG tablet  Commonly known as:  DESYREL  Take 1 tablet (100 mg total) by mouth at bedtime.   Indication:  Trouble Sleeping           Follow-up Information   Follow up with [facility] On 04/15/2013. (Appointment scheduled at 12:30 pm with Ledon Snare for community support team intake assessment)    Contact information:   [address] [place], [place] [address] Phone: [phone] Fax: [phone]      Follow up with [facility] On 04/15/2013. (Appointment scheduled at 1:30 pm with Dr. Midge Aver on this date for medication management.)    Contact information:   [address] [place], [place] [address] Phone: [phone] Fax: [phone]      Follow-up recommendations:   Activities: Resume activity as tolerated. Diet: Heart healthy low sodium diet Tests: Follow up testing will be determined by your out patient provider. Comments:    Total Discharge Time:  Less than 30 minutes.  Signed: MASHBURN,NEIL 04/13/2013, 10:27 AM  Patient seen personally for the psych evaluation, suicidal risk assessment and case discussed with treatment team and formulated treatment plan and than disposition plan. Reviewed the information documented and agree with the treatment plan.  Tanda Morrissey,JANARDHAHA R. 04/27/2013 12:42 PM

## 2013-04-16 NOTE — Progress Notes (Signed)
Patient Discharge Instructions:  After Visit Summary (AVS):   Faxed to:  04/16/13 Psychiatric Admission Assessment Note:   Faxed to:  04/16/13 Suicide Risk Assessment - Discharge Assessment:   Faxed to:  04/16/13 Faxed/Sent to the Next Level Care provider:  04/16/13 Faxed to [facility] @ [phone]  [name], 04/16/2013, 2:25 PM

## 2013-04-21 ENCOUNTER — Ambulatory Visit: Payer: Self-pay | Admitting: Internal Medicine

## 2013-05-12 ENCOUNTER — Encounter: Payer: Self-pay | Admitting: Internal Medicine

## 2013-05-12 ENCOUNTER — Ambulatory Visit (INDEPENDENT_AMBULATORY_CARE_PROVIDER_SITE_OTHER): Payer: PRIVATE HEALTH INSURANCE | Admitting: Internal Medicine

## 2013-05-12 VITALS — BP 140/85 | HR 97 | Temp 98.3°F | Ht 63.5 in | Wt 188.1 lb

## 2013-05-12 DIAGNOSIS — R51 Headache: Secondary | ICD-10-CM

## 2013-05-12 DIAGNOSIS — I10 Essential (primary) hypertension: Secondary | ICD-10-CM

## 2013-05-12 DIAGNOSIS — R519 Headache, unspecified: Secondary | ICD-10-CM | POA: Insufficient documentation

## 2013-05-12 DIAGNOSIS — E119 Type 2 diabetes mellitus without complications: Secondary | ICD-10-CM

## 2013-05-12 DIAGNOSIS — J441 Chronic obstructive pulmonary disease with (acute) exacerbation: Secondary | ICD-10-CM

## 2013-05-12 MED ORDER — GABAPENTIN 100 MG PO CAPS: ORAL_CAPSULE | ORAL | Status: DC

## 2013-05-12 NOTE — Assessment & Plan Note (Signed)
Assessment:  Headache pain after to trauma.  No LOC during the injury.  No focal neurologic deficits on exam and speech, coordination and consciousness intact making intracranial bleed unlikely.  Her pain is likely due to local irritation from the trauma.  Plan:   1) She agrees to try OTC medications for pain relief.  2) Advised to go to the ED with new or worsening symptoms

## 2013-05-12 NOTE — Patient Instructions (Addendum)
1. You can take an over the counter pain reliever (like ibuprofen) for your headache pain.   2. Please take all medications as prescribed.     3. If you have worsening of your symptoms or new symptoms arise, please call the clinic ([phone]), or go to the ER immediately if symptoms are severe.

## 2013-05-12 NOTE — Progress Notes (Signed)
   Subjective:    Patient ID: [name], female    DOB: 1955-07-18, 57 y.o.   MRN: [id number]  HPI Comments: [name] is a 57 year old woman with a PMH of HTN, DM type 2, hypothyroidism, schizophrenia, depression and anxiety who presents with a 10 day history of headache pain after hitting the back of her head.  She says she slipped in her home and hit the back of her head against the wall in her home 10 days ago.  She denies LOC, CP/palpitations or seizure activity surrounding the event.  She has felt dizzy since that time and vomited once yesterday.  She initially felt pain in the back of her head but now feels it more at the front.  She denies loss of vision but says she feels her vision is blurry.  She has a history of glaucoma.  She has had not change in her eating, bowel or bladder habits.      Review of Systems  Constitutional: Negative for fever, chills, activity change and appetite change.  Eyes: Positive for visual disturbance.  Respiratory: Negative for shortness of breath.   Cardiovascular: Negative for chest pain and palpitations.  Gastrointestinal: Positive for nausea and vomiting. Negative for diarrhea and constipation.  Genitourinary: Negative for dysuria.  Neurological: Positive for dizziness. Negative for seizures and syncope.       Objective:   Physical Exam  Vitals reviewed. Constitutional: She is oriented to person, place, and time. She appears well-developed and well-nourished. No distress.  HENT:  Head: Normocephalic and atraumatic.  Mouth/Throat: Oropharynx is clear and moist. No oropharyngeal exudate.  Eyes: EOM are normal. Pupils are equal, round, and reactive to light. No scleral icterus.  Neck: Normal range of motion. Neck supple.  Cardiovascular: Normal rate, regular rhythm and normal heart sounds.   Pulmonary/Chest: Effort normal and breath sounds normal. No respiratory distress. She has no wheezes. She has no rales.  Abdominal: Soft. Bowel sounds are  normal. She exhibits no distension. There is no tenderness.  Musculoskeletal: Normal range of motion. She exhibits no edema and no tenderness.  Neurological: She is alert and oriented to person, place, and time. She displays normal reflexes. No cranial nerve deficit. She exhibits normal muscle tone. Coordination normal.  No gait abnormality; 5/5 MMS; no pronator drift  Skin: Skin is warm. She is not diaphoretic.  Psychiatric: She has a normal mood and affect. Her behavior is normal.          Assessment & Plan:  Please see problem based assessment and plan.

## 2013-05-13 LAB — MICROALBUMIN / CREATININE URINE RATIO
Creatinine, Urine: 48.6 mg/dL
Microalb Creat Ratio: 12.1 mg/g (ref 0.0–30.0)
Microalb, Ur: 0.59 mg/dL (ref 0.00–1.89)

## 2013-05-13 NOTE — Progress Notes (Signed)
I saw and evaluated the patient.  I personally confirmed the key portions of the history and exam documented by Dr. Wilson and I reviewed pertinent patient test results.  The assessment, diagnosis, and plan were formulated together and I agree with the documentation in the resident's note. 

## 2013-05-28 HISTORY — PX: ESOPHAGOGASTRODUODENOSCOPY (EGD) WITH ESOPHAGEAL DILATION: SHX5812

## 2013-06-19 ENCOUNTER — Emergency Department (HOSPITAL_COMMUNITY): Payer: PRIVATE HEALTH INSURANCE

## 2013-06-19 ENCOUNTER — Emergency Department (HOSPITAL_COMMUNITY)
Admission: EM | Admit: 2013-06-19 | Discharge: 2013-06-19 | Disposition: A | Discharge status: Home / Self Care | Payer: PRIVATE HEALTH INSURANCE | Attending: Emergency Medicine | Admitting: Emergency Medicine

## 2013-06-19 ENCOUNTER — Encounter (HOSPITAL_COMMUNITY): Payer: Self-pay | Admitting: Emergency Medicine

## 2013-06-19 DIAGNOSIS — J45901 Unspecified asthma with (acute) exacerbation: Secondary | ICD-10-CM

## 2013-06-19 DIAGNOSIS — E119 Type 2 diabetes mellitus without complications: Secondary | ICD-10-CM | POA: Insufficient documentation

## 2013-06-19 DIAGNOSIS — M1990 Unspecified osteoarthritis, unspecified site: Secondary | ICD-10-CM | POA: Insufficient documentation

## 2013-06-19 DIAGNOSIS — N183 Chronic kidney disease, stage 3 unspecified: Secondary | ICD-10-CM | POA: Insufficient documentation

## 2013-06-19 DIAGNOSIS — E0590 Thyrotoxicosis, unspecified without thyrotoxic crisis or storm: Secondary | ICD-10-CM | POA: Insufficient documentation

## 2013-06-19 DIAGNOSIS — H409 Unspecified glaucoma: Secondary | ICD-10-CM | POA: Insufficient documentation

## 2013-06-19 DIAGNOSIS — R0789 Other chest pain: Secondary | ICD-10-CM

## 2013-06-19 DIAGNOSIS — J40 Bronchitis, not specified as acute or chronic: Secondary | ICD-10-CM

## 2013-06-19 DIAGNOSIS — G8929 Other chronic pain: Secondary | ICD-10-CM | POA: Insufficient documentation

## 2013-06-19 DIAGNOSIS — I129 Hypertensive chronic kidney disease with stage 1 through stage 4 chronic kidney disease, or unspecified chronic kidney disease: Secondary | ICD-10-CM | POA: Insufficient documentation

## 2013-06-19 DIAGNOSIS — M109 Gout, unspecified: Secondary | ICD-10-CM | POA: Insufficient documentation

## 2013-06-19 DIAGNOSIS — E785 Hyperlipidemia, unspecified: Secondary | ICD-10-CM | POA: Insufficient documentation

## 2013-06-19 DIAGNOSIS — J441 Chronic obstructive pulmonary disease with (acute) exacerbation: Secondary | ICD-10-CM | POA: Insufficient documentation

## 2013-06-19 DIAGNOSIS — Z7982 Long term (current) use of aspirin: Secondary | ICD-10-CM | POA: Insufficient documentation

## 2013-06-19 DIAGNOSIS — R071 Chest pain on breathing: Secondary | ICD-10-CM | POA: Insufficient documentation

## 2013-06-19 DIAGNOSIS — Z794 Long term (current) use of insulin: Secondary | ICD-10-CM | POA: Insufficient documentation

## 2013-06-19 DIAGNOSIS — F209 Schizophrenia, unspecified: Secondary | ICD-10-CM | POA: Insufficient documentation

## 2013-06-19 DIAGNOSIS — IMO0002 Reserved for concepts with insufficient information to code with codable children: Secondary | ICD-10-CM | POA: Insufficient documentation

## 2013-06-19 DIAGNOSIS — N390 Urinary tract infection, site not specified: Secondary | ICD-10-CM | POA: Insufficient documentation

## 2013-06-19 DIAGNOSIS — Z79899 Other long term (current) drug therapy: Secondary | ICD-10-CM | POA: Insufficient documentation

## 2013-06-19 DIAGNOSIS — R739 Hyperglycemia, unspecified: Secondary | ICD-10-CM

## 2013-06-19 DIAGNOSIS — Z860100 Personal history of colon polyps, unspecified: Secondary | ICD-10-CM | POA: Insufficient documentation

## 2013-06-19 DIAGNOSIS — M1710 Unilateral primary osteoarthritis, unspecified knee: Secondary | ICD-10-CM | POA: Insufficient documentation

## 2013-06-19 DIAGNOSIS — Z8673 Personal history of transient ischemic attack (TIA), and cerebral infarction without residual deficits: Secondary | ICD-10-CM | POA: Insufficient documentation

## 2013-06-19 DIAGNOSIS — F17200 Nicotine dependence, unspecified, uncomplicated: Secondary | ICD-10-CM | POA: Insufficient documentation

## 2013-06-19 DIAGNOSIS — F319 Bipolar disorder, unspecified: Secondary | ICD-10-CM | POA: Insufficient documentation

## 2013-06-19 LAB — BASIC METABOLIC PANEL
BUN: 12 mg/dL (ref 6–23)
CALCIUM: 9 mg/dL (ref 8.4–10.5)
CHLORIDE: 92 meq/L — AB (ref 96–112)
CO2: 20 meq/L (ref 19–32)
Creatinine, Ser: 1.09 mg/dL (ref 0.50–1.10)
GFR calc Af Amer: 65 mL/min — ABNORMAL LOW (ref >90)
GFR calc non Af Amer: 56 mL/min — ABNORMAL LOW (ref >90)
GLUCOSE: 547 mg/dL — AB (ref 70–99)
Potassium: 4.1 mEq/L (ref 3.7–5.3)
Sodium: 131 mEq/L — ABNORMAL LOW (ref 137–147)

## 2013-06-19 LAB — CBC WITH DIFFERENTIAL/PLATELET
Basophils Absolute: 0 K/uL (ref 0.0–0.1)
Basophils Relative: 0 % (ref 0–1)
Eosinophils Absolute: 0.2 K/uL (ref 0.0–0.7)
Eosinophils Relative: 3 % (ref 0–5)
HCT: 39.3 % (ref 36.0–46.0)
Hemoglobin: 13.1 g/dL (ref 12.0–15.0)
Lymphocytes Relative: 34 % (ref 12–46)
Lymphs Abs: 2 K/uL (ref 0.7–4.0)
MCH: 28.9 pg (ref 26.0–34.0)
MCHC: 33.3 g/dL (ref 30.0–36.0)
MCV: 86.6 fL (ref 78.0–100.0)
Monocytes Absolute: 0.5 K/uL (ref 0.1–1.0)
Monocytes Relative: 8 % (ref 3–12)
Neutro Abs: 3.2 K/uL (ref 1.7–7.7)
Neutrophils Relative %: 55 % (ref 43–77)
Platelets: 242 K/uL (ref 150–400)
RBC: 4.54 MIL/uL (ref 3.87–5.11)
RDW: 15.9 % — ABNORMAL HIGH (ref 11.5–15.5)
WBC: 5.8 K/uL (ref 4.0–10.5)

## 2013-06-19 LAB — URINALYSIS, ROUTINE W REFLEX MICROSCOPIC
Bilirubin Urine: NEGATIVE
HGB URINE DIPSTICK: NEGATIVE
Ketones, ur: NEGATIVE mg/dL
Nitrite: NEGATIVE
PH: 5.5 (ref 5.0–8.0)
Protein, ur: NEGATIVE mg/dL
SPECIFIC GRAVITY, URINE: 1.031 — AB (ref 1.005–1.030)
UROBILINOGEN UA: 0.2 mg/dL (ref 0.0–1.0)

## 2013-06-19 LAB — URINE MICROSCOPIC-ADD ON

## 2013-06-19 LAB — TROPONIN I

## 2013-06-19 LAB — GLUCOSE, CAPILLARY
GLUCOSE-CAPILLARY: 359 mg/dL — AB (ref 70–99)
Glucose-Capillary: 493 mg/dL — ABNORMAL HIGH (ref 70–99)

## 2013-06-19 MED ORDER — FENTANYL CITRATE 0.05 MG/ML IJ SOLN
50.0000 ug | Freq: Once | INTRAMUSCULAR | Status: AC
  Administered 2013-06-19: 50 ug via INTRAVENOUS
  Filled 2013-06-19: qty 2

## 2013-06-19 MED ORDER — PREDNISONE 20 MG PO TABS
60.0000 mg | ORAL_TABLET | Freq: Once | ORAL | Status: AC
  Administered 2013-06-19: 60 mg via ORAL
  Filled 2013-06-19: qty 3

## 2013-06-19 MED ORDER — CEPHALEXIN 500 MG PO CAPS: 500.0000 mg | ORAL_CAPSULE | Freq: Three times a day (TID) | ORAL | Status: DC

## 2013-06-19 MED ORDER — TRAMADOL HCL 50 MG PO TABS
50.0000 mg | ORAL_TABLET | Freq: Once | ORAL | Status: AC
  Administered 2013-06-19: 50 mg via ORAL
  Filled 2013-06-19: qty 1

## 2013-06-19 MED ORDER — ALBUTEROL SULFATE (2.5 MG/3ML) 0.083% IN NEBU
5.0000 mg | INHALATION_SOLUTION | Freq: Once | RESPIRATORY_TRACT | Status: AC
  Administered 2013-06-19: 5 mg via RESPIRATORY_TRACT
  Filled 2013-06-19: qty 6

## 2013-06-19 MED ORDER — INSULIN ASPART 100 UNIT/ML ~~LOC~~ SOLN
5.0000 [IU] | Freq: Once | SUBCUTANEOUS | Status: DC
  Filled 2013-06-19: qty 1

## 2013-06-19 MED ORDER — CIPROFLOXACIN HCL 500 MG PO TABS: 500.0000 mg | ORAL_TABLET | Freq: Two times a day (BID) | ORAL | Status: DC

## 2013-06-19 MED ORDER — IPRATROPIUM BROMIDE 0.02 % IN SOLN
0.5000 mg | Freq: Once | RESPIRATORY_TRACT | Status: AC
  Administered 2013-06-19: 0.5 mg via RESPIRATORY_TRACT
  Filled 2013-06-19: qty 2.5

## 2013-06-19 MED ORDER — SODIUM CHLORIDE 0.9 % IV BOLUS (SEPSIS)
1000.0000 mL | Freq: Once | INTRAVENOUS | Status: AC
  Administered 2013-06-19: 1000 mL via INTRAVENOUS

## 2013-06-19 MED ORDER — INSULIN ASPART 100 UNIT/ML ~~LOC~~ SOLN
5.0000 [IU] | Freq: Once | SUBCUTANEOUS | Status: AC
  Administered 2013-06-19: 5 [IU] via SUBCUTANEOUS

## 2013-06-19 MED ORDER — PREDNISONE 20 MG PO TABS: ORAL_TABLET | ORAL | Status: DC

## 2013-06-19 NOTE — ED Notes (Signed)
Pt transported to xray 

## 2013-06-19 NOTE — ED Provider Notes (Signed)
CSN: [id number]     Arrival date & time 06/19/13  8657 History   First MD Initiated Contact with Patient 06/19/13 [phone]     Chief Complaint  Patient presents with  . Cough  . Chest Pain   (Consider location/radiation/quality/duration/timing/severity/associated sxs/prior Treatment) HPI Comments: 58 yo female with COPD, HTN, DM, Chol, Cocaine use, Hyperglycemia presents with productive cough and anterior/ right chest ache with coughing.  No cardiac hx known.  Non radiating.  Gradually worsening. No cp with exertion.  Husband has pneumonia.  Patient denies blood clot history, active cancer, recent major trauma or surgery, unilateral leg swelling/ pain, recent long travel, hemoptysis or oral contraceptives.   Patient is a 58 y.o. female presenting with cough and chest pain. The history is provided by the patient.  Cough Associated symptoms: chest pain   Associated symptoms: no chills, no fever, no headaches, no rash and no shortness of breath   Chest Pain Associated symptoms: cough   Associated symptoms: no abdominal pain, no back pain, no fever, no headache, no shortness of breath and not vomiting     Past Medical History  Diagnosis Date  . Asthma     past 10 years  . Diabetes mellitus     past 5 years  . Glaucoma     dx 06/2011-[facility]  . Hypertension   . Hyperlipidemia   . Hyperthyroidism   . History of TIA (transient ischemic attack)     01/06/2011, x2  . Pancreatitis   . Gout   . Depression   . Renal disorder     chronic kidney disease, stage 3  . Chronic pain   . Glaucoma   . Arthritis     djd -lower back, left knee  . Degenerative disc disease   . Bipolar disorder   . Gastritis   . COPD (chronic obstructive pulmonary disease)   . Schizophrenia   . 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 Colon polyps     colonoscopy 01/2013   . Fatty liver     mild CT 06/2012   Past Surgical History  Procedure Laterality Date  . Vaginal hysterectomy  1990   . Abdominal hysterectomy      still has ovaries    Family History  Problem Relation Age of Onset  . Arthritis Mother   . Diabetes Mother   . Hypertension Mother   . Kidney disease Mother   . Heart disease Mother     died 57  . Heart disease      mother & father  . Hypertension      mother & father  . Colon cancer Neg Hx   . Prostate cancer Neg Hx   . Breast cancer Neg Hx   . Stomach cancer Neg Hx   . Hypertension Sister   . Hypertension Brother   . Kidney disease Brother    History  Substance Use Topics  . Smoking status: Current Some Day Smoker -- 0.01 packs/day for 30 years    Types: Cigarettes  . Smokeless tobacco: Never Used     Comment: smokes 2 cigs daily/uses patches.  . Alcohol Use: No     Comment: denied alcohol at this time   OB History   Grav Para Term Preterm Abortions TAB SAB Ect Mult Living                 Review of Systems  Constitutional: Negative for fever and chills.  HENT: Negative for congestion.  Eyes: Negative for visual disturbance.  Respiratory: Positive for cough. Negative for shortness of breath.   Cardiovascular: Positive for chest pain.  Gastrointestinal: Negative for vomiting and abdominal pain.  Genitourinary: Negative for dysuria and flank pain.  Musculoskeletal: Negative for back pain, neck pain and neck stiffness.  Skin: Negative for rash.  Neurological: Negative for light-headedness and headaches.    Allergies  Hydrocodone-acetaminophen and Metformin  Home Medications   Current Outpatient Rx  Name  Route  Sig  Dispense  Refill  . albuterol (PROVENTIL HFA;VENTOLIN HFA) 108 (90 BASE) MCG/ACT inhaler   Inhalation   Inhale 2 puffs into the lungs every 6 (six) hours as needed for wheezing.   1 Inhaler   2   . allopurinol (ZYLOPRIM) 300 MG tablet   Oral   Take 1 tablet (300 mg total) by mouth daily. For gout.         Marland Kitchen amLODipine (NORVASC) 5 MG tablet   Oral   Take 1 tablet (5 mg total) by mouth daily. For  hypertension.   30 tablet   0   . aspirin EC 81 MG tablet   Oral   Take 1 tablet (81 mg total) by mouth daily. For platelet aggregation.   30 tablet   0   . atorvastatin (LIPITOR) 80 MG tablet   Oral   Take 1 tablet (80 mg total) by mouth every morning.         . brimonidine (ALPHAGAN) 0.2 % ophthalmic solution   Both Eyes   Place 1 drop into both eyes 2 (two) times daily.   5 mL   12   . colchicine 0.6 MG tablet   Oral   Take 1 tablet (0.6 mg total) by mouth daily as needed (for gout flare-ups).         . Fluticasone-Salmeterol (ADVAIR DISKUS) 250-50 MCG/DOSE AEPB   Inhalation   Inhale 1 puff into the lungs 2 times daily at 12 noon and 4 pm.   60 each   0   . gabapentin (NEURONTIN) 100 MG capsule      Take 3 (100mg ) capsules for a dose of (300mg ) three times each day for neuropathic pain and anxiety.   270 capsule   0     Dispense as written.   . haloperidol (HALDOL) 5 MG tablet   Oral   Take 1 tablet (5 mg total) by mouth at bedtime. For psychosis and irritability.   30 tablet   0   . insulin glargine (LANTUS) 100 UNIT/ML injection   Subcutaneous   Inject 20 Units into the skin at bedtime.         Marland Kitchen latanoprost (XALATAN) 0.005 % ophthalmic solution   Both Eyes   Place 1 drop into both eyes at bedtime.   2.5 mL   0   . levothyroxine (SYNTHROID, LEVOTHROID) 300 MCG tablet   Oral   Take 1 tablet (300 mcg total) by mouth daily before breakfast.   30 tablet   0   . mometasone-formoterol (DULERA) 100-5 MCG/ACT AERO   Inhalation   Inhale 2 puffs into the lungs 2 (two) times daily.   1 Inhaler      . nitroGLYCERIN (NITROSTAT) 0.4 MG SL tablet   Sublingual   Place 1 tablet (0.4 mg total) under the tongue every 5 (five) minutes as needed for chest pain.   30 tablet   3   . pantoprazole (PROTONIX) 40 MG tablet   Oral   Take 1  tablet (40 mg total) by mouth daily. For reflux.   30 tablet   11   . sertraline (ZOLOFT) 100 MG tablet   Oral    Take 100 mg by mouth daily.         . traZODone (DESYREL) 100 MG tablet   Oral   Take 1 tablet (100 mg total) by mouth at bedtime.   30 tablet   0    BP 140/73  Pulse 86  Temp(Src) 97.7 F (36.5 C) (Oral)  Resp 20  SpO2 94% Physical Exam  Nursing note and vitals reviewed. Constitutional: She is oriented to person, place, and time. She appears well-developed and well-nourished.  HENT:  Head: Normocephalic and atraumatic.  Mild dry mm  Eyes: Conjunctivae are normal. Right eye exhibits no discharge. Left eye exhibits no discharge.  Neck: Normal range of motion. Neck supple. No tracheal deviation present.  Cardiovascular: Normal rate and regular rhythm.   Pulmonary/Chest: Effort normal. She has wheezes (mild end exp). She has rales (bases bilateral mild).  Abdominal: Soft. She exhibits no distension. There is no tenderness. There is no guarding.  Musculoskeletal: She exhibits no edema.  Neurological: She is alert and oriented to person, place, and time.  Skin: Skin is warm. No rash noted.  Psychiatric: She has a normal mood and affect.    ED Course  Procedures (including critical care time) Labs Review Labs Reviewed  CBC WITH DIFFERENTIAL - Abnormal; Notable for the following:    RDW 15.9 (*)    All other components within normal limits  BASIC METABOLIC PANEL - Abnormal; Notable for the following:    Sodium 131 (*)    Chloride 92 (*)    Glucose, Bld 547 (*)    GFR calc non Af Amer 56 (*)    GFR calc Af Amer 65 (*)    All other components within normal limits  GLUCOSE, CAPILLARY - Abnormal; Notable for the following:    Glucose-Capillary 493 (*)    All other components within normal limits  URINALYSIS, ROUTINE W REFLEX MICROSCOPIC - Abnormal; Notable for the following:    APPearance CLOUDY (*)    Specific Gravity, Urine 1.031 (*)    Glucose, UA >1000 (*)    Leukocytes, UA MODERATE (*)    All other components within normal limits  URINE MICROSCOPIC-ADD ON -  Abnormal; Notable for the following:    Squamous Epithelial / LPF FEW (*)    All other components within normal limits  TROPONIN I   Imaging Review Dg Chest 2 View  06/19/2013   CLINICAL DATA:  Cough, COPD, history of asthma and smoking  EXAM: CHEST  2 VIEW  COMPARISON:  03/10/2013; 11/07/2012; 07/03/2012  FINDINGS: Grossly unchanged cardiac silhouette and mediastinal contours. Improved aeration of the lungs with persistent mild diffuse slightly nodular thickening of the pulmonary interstitium. Grossly unchanged minimal left basilar linear heterogeneous opacities favored to represent atelectasis. No new focal airspace opacities. No pleural effusion or pneumothorax. There is unchanged minimal pleural parenchymal thickening about the right minor fissure. Grossly unchanged bones.  IMPRESSION: Chronic bronchitic change without acute cardiopulmonary disease.   Electronically Signed   By: Sandi Mariscal M.D.   On: 06/19/2013 10:02    EKG Interpretation    Date/Time:  Friday June 19 2013 09:09:01 EST Ventricular Rate:  76 PR Interval:  145 QRS Duration: 69 QT Interval:  503 QTC Calculation: 566 R Axis:   54 Text Interpretation:  Sinus rhythm Borderline T abnormalities, diffuse leads Prolonged  QT interval Confirmed by Raziyah Vanvleck  MD, Jourdin Connors ([id number]) on 06/19/2013 9:57:55 AM            MDM   1. Chest wall pain   2. UTI (lower urinary tract infection)   3. Bronchitis   4. COPD exacerbation   5. Hyperglycemia    Clinically bronchitis/ copd vs pneumonia.  With atypical chest ache with coughing and risk factors Plan for screening ekg and troponin. No acute ekg findings, mild prolonged QT.  Troponin neg.  Pt to fup with pcp if cp persists after infection resolves. Nebs, steroids, pain meds. Fluid bolus and insulin in ED.    Pt improved on recheck.  CXR reviewed, no infiltrate. Pt feels comfortable with outpt fup, discussed uncontrolled glu and that it will be elevated with steroids/  infection. UTI- keflex, no fever or flank pain.    Results and differential diagnosis were discussed with the patient. Close follow up outpatient was discussed, patient comfortable with the plan.         Mariea Clonts, MD 06/19/13 [phone]

## 2013-06-19 NOTE — ED Notes (Signed)
EDP Zavitz notified of pt's CBG of 493.

## 2013-06-19 NOTE — ED Notes (Signed)
Pt reports progressive productive cough over past week with white sputum. Pt reports shortness of breath and central chest pain with cough starting last night. Pt reports able to ambulate.

## 2013-06-19 NOTE — ED Notes (Signed)
Pt reports mild itch and irration to vaginal area. Pt believes may have yeast infection due to high blood sugar levels.

## 2013-06-19 NOTE — Discharge Instructions (Signed)
If you were given medicines take as directed.  If you are on coumadin or contraceptives realize their levels and effectiveness is altered by many different medicines.  If you have any reaction (rash, tongues swelling, other) to the medicines stop taking and see a physician.   Please follow up as directed and return to the ER or see a physician for new or worsening symptoms.  Thank you. Your glucose will be elevated from prednisone and infection so speak to your doctor tomorrow for glucose and insulin management.

## 2013-06-22 ENCOUNTER — Encounter: Payer: PRIVATE HEALTH INSURANCE | Admitting: Dietician

## 2013-06-22 ENCOUNTER — Encounter: Payer: Self-pay | Admitting: Internal Medicine

## 2013-06-22 ENCOUNTER — Ambulatory Visit (INDEPENDENT_AMBULATORY_CARE_PROVIDER_SITE_OTHER): Payer: PRIVATE HEALTH INSURANCE | Admitting: Internal Medicine

## 2013-06-22 VITALS — BP 155/96 | HR 75 | Temp 97.0°F | Wt 190.1 lb

## 2013-06-22 DIAGNOSIS — R1310 Dysphagia, unspecified: Secondary | ICD-10-CM | POA: Insufficient documentation

## 2013-06-22 DIAGNOSIS — J449 Chronic obstructive pulmonary disease, unspecified: Secondary | ICD-10-CM

## 2013-06-22 DIAGNOSIS — F339 Major depressive disorder, recurrent, unspecified: Secondary | ICD-10-CM

## 2013-06-22 DIAGNOSIS — I10 Essential (primary) hypertension: Secondary | ICD-10-CM

## 2013-06-22 DIAGNOSIS — R05 Cough: Secondary | ICD-10-CM | POA: Insufficient documentation

## 2013-06-22 DIAGNOSIS — R4789 Other speech disturbances: Secondary | ICD-10-CM

## 2013-06-22 DIAGNOSIS — E119 Type 2 diabetes mellitus without complications: Secondary | ICD-10-CM

## 2013-06-22 DIAGNOSIS — R4702 Dysphasia: Secondary | ICD-10-CM

## 2013-06-22 DIAGNOSIS — R1319 Other dysphagia: Secondary | ICD-10-CM | POA: Insufficient documentation

## 2013-06-22 DIAGNOSIS — G629 Polyneuropathy, unspecified: Secondary | ICD-10-CM

## 2013-06-22 DIAGNOSIS — R059 Cough, unspecified: Secondary | ICD-10-CM

## 2013-06-22 LAB — HM DIABETES EYE EXAM

## 2013-06-22 LAB — GLUCOSE, CAPILLARY: Glucose-Capillary: 351 mg/dL — ABNORMAL HIGH (ref 70–99)

## 2013-06-22 MED ORDER — GUAIFENESIN-DM 100-10 MG/5ML PO SYRP: 5.0000 mL | ORAL_SOLUTION | ORAL | Status: DC | PRN

## 2013-06-22 NOTE — Progress Notes (Signed)
Patient ID: [name], female   DOB: 06/17/1955, 58 y.o.   MRN: [id number] HPI [name] is a 58 year old woman with a PMH of HTN,HLD, cocaine abuse, COPD, uncontrolled DM type 2, hypothyroidism, schizophrenia, depression and anxiety who presents for an acute visit.  Chest pain- patient reports having intermittent sharp chest pain that began on January 23. She was seen in the emergency department on 1/23 at which time she had a negative chest x-ray, 2 negative troponins. Also found to have a UTI for which she was given a seven-day course of Keflex. She reports over the last day or so her chest pain has become constant and is still stabbing in nature. The chest pain is worse with coughing, is not worse with deep breathing, lying flat, movements. It is tender. Patient reports having a cough productive of gray sputum over the last 2 days. She also has rhinorrhea, but denies sore throat, body aches, fever, worsening shortness of breath (patient has shortness of breath at baseline). Patient still smokes 2 cigarettes per day but is in the process of cutting back.  Dysphagia- patient reports having intermittent dysphagia on and off over the last year. She also reports intermittent nausea and states that sometimes she regurgitates undigested food. She's had no weight loss over the last year. Patient has intermittent abdominal pain, which she attributes to her "chronic pancreatitis". I'm unsure if patient actually has chronic pancreatitis though she has had episodes of acute pancreatitis in the past. No concern for acute pancreatitis at this time as patient does not have any abdominal pain, nausea, vomiting today. Patient is concerned about the discomfort she has been having when eating solid food and would like a GI referral today.  DM type 2, uncontrolled- last A1c 13.6, November 2014. Patient reports checking her blood sugar at home at least once per day, but is unable to report what her blood sugar runs at home.  She did not bring her meter today. I did remind her to bring her meter next time. Patient is due for an ophthalmology exam, will do retinal scan today in office. Patient is also due for foot exam which was also performed today. CBG today 351. Patient does have a callus over her left distal fifth metatarsal, no skin breakdown. She is interested in diabetic shoes.  ROS: General: no fevers, chills, changes in weight, changes in appetite Skin: no rash HEENT: +rhinorrhea; no blurry vision, hearing changes, sore throat Pulm: see HPI CV: see HPI Abd: see HPI GU: no dysuria, hematuria, polyuria Ext: no arthralgias, myalgias Neuro: no weakness, numbness, or tingling  Filed Vitals:   06/22/13 1324  BP: 159/93  Pulse: 82  Temp: 97 F (36.1 C)   Physical Exam General: alert, cooperative, and in no apparent distress HEENT: pupils equal round and reactive to light, vision grossly intact, oropharynx clear and non-erythematous  Neck: supple Lungs: clear to ascultation bilaterally, no wheezes, rhonchi; normal WOB Heart: regular rate and rhythm, no murmurs, gallops, or rubs Abdomen: soft, non-tender, non-distended, normal bowel sounds Extremities: weak DP pulses bilaterally; thick callus over left fifth distal metatarsal  Neurologic: alert & oriented X3, cranial nerves II-XII grossly intact, strength grossly intact, sensation decreased to light touch to b/l lower extremities to ankles  Current Outpatient Prescriptions on File Prior to Visit  Medication Sig Dispense Refill  . albuterol (PROVENTIL HFA;VENTOLIN HFA) 108 (90 BASE) MCG/ACT inhaler Inhale 2 puffs into the lungs every 6 (six) hours as needed for wheezing.  1 Inhaler  2  . allopurinol (ZYLOPRIM) 300 MG tablet Take 1 tablet (300 mg total) by mouth daily. For gout.      Marland Kitchen amLODipine (NORVASC) 5 MG tablet Take 1 tablet (5 mg total) by mouth daily. For hypertension.  30 tablet  0  . aspirin EC 81 MG tablet Take 1 tablet (81 mg total) by mouth  daily. For platelet aggregation.  30 tablet  0  . atorvastatin (LIPITOR) 80 MG tablet Take 1 tablet (80 mg total) by mouth every morning.      . brimonidine (ALPHAGAN) 0.2 % ophthalmic solution Place 1 drop into both eyes 2 (two) times daily.  5 mL  12  . cephALEXin (KEFLEX) 500 MG capsule Take 1 capsule (500 mg total) by mouth 3 (three) times daily.  21 capsule  0  . colchicine 0.6 MG tablet Take 1 tablet (0.6 mg total) by mouth daily as needed (for gout flare-ups).      . Fluticasone-Salmeterol (ADVAIR DISKUS) 250-50 MCG/DOSE AEPB Inhale 1 puff into the lungs 2 times daily at 12 noon and 4 pm.  60 each  0  . gabapentin (NEURONTIN) 100 MG capsule Take 3 (100mg ) capsules for a dose of (300mg ) three times each day for neuropathic pain and anxiety.  270 capsule  0  . haloperidol (HALDOL) 5 MG tablet Take 1 tablet (5 mg total) by mouth at bedtime. For psychosis and irritability.  30 tablet  0  . insulin glargine (LANTUS) 100 UNIT/ML injection Inject 20 Units into the skin at bedtime.      Marland Kitchen latanoprost (XALATAN) 0.005 % ophthalmic solution Place 1 drop into both eyes at bedtime.  2.5 mL  0  . levothyroxine (SYNTHROID, LEVOTHROID) 300 MCG tablet Take 1 tablet (300 mcg total) by mouth daily before breakfast.  30 tablet  0  . mometasone-formoterol (DULERA) 100-5 MCG/ACT AERO Inhale 2 puffs into the lungs 2 (two) times daily.  1 Inhaler    . nitroGLYCERIN (NITROSTAT) 0.4 MG SL tablet Place 1 tablet (0.4 mg total) under the tongue every 5 (five) minutes as needed for chest pain.  30 tablet  3  . pantoprazole (PROTONIX) 40 MG tablet Take 1 tablet (40 mg total) by mouth daily. For reflux.  30 tablet  11  . sertraline (ZOLOFT) 100 MG tablet Take 100 mg by mouth daily.      . traZODone (DESYREL) 100 MG tablet Take 1 tablet (100 mg total) by mouth at bedtime.  30 tablet  0   No current facility-administered medications on file prior to visit.    Assessment/Plan

## 2013-06-22 NOTE — Assessment & Plan Note (Signed)
Shortness history of poorly controlled diabetes mellitus type 2. Foot exam was performed today and I found a callus over her left proximal fifth metatarsal. The sensation to her bilateral feet is decreased to light touch in her DP pulses are weak bilaterally. I will refer her to podiatry for this as well as for diabetic shoes. Patient received an ophthalmology exam today with our retinal scanner. Her last A1c was 13.6 and November 2014 so she's not due for another A1c today. I emphasized importance of compliance with insulin as well as importance of patient bringing her glucometer to her next appointment. I also emphasized the importance of patient checking her blood glucose levels 3 times a day. Patient will try to do this.

## 2013-06-22 NOTE — Assessment & Plan Note (Addendum)
Blood pressure slightly elevated today 153/93. I rechecked blood pressure, 155/96. Instructed patient to continue her current regimen and we will recheck it at her next visit as she has not had repeated elevated blood pressures recently. May need to make medication adjustments if her BP is elevated at her next visit.

## 2013-06-22 NOTE — Assessment & Plan Note (Signed)
Patient reports dysphasia, no odynophagia or weight loss, over the last year. Food gets stuck in her chest about 2 times a week. She also has intermittent nausea and abdominal pain. Patient attributes the abdominal pain to chronic pancreatitis though I do not see history of chronic pancreatitis in her records. Patient did have an EGD at [facility] in 2012. I do not have access to these records through care everywhere, though I do see documentation that we attempted to obtain his records last year. I will refer her to GI today given her persistent dysphasia.

## 2013-06-22 NOTE — Assessment & Plan Note (Signed)
Patient with cough productive of gray sputum as well as rhinorrhea for the last couple of days. Patient developed sharp chest pain associated with cough for which she was seen in the ED on January 23. Chest x-ray at this time was within normal limits and ACS was ruled out. I suspect that she has a viral URI. I think the cough caused a musculoskeletal strain, which is causing her chest pain as her chest is tender to palpation and worsens with cough. Costochondritis is also on the differential. I prescribed Robitussin-DM for patient to help with the cough, which will likely also in turn help with the chest pain. No antibiotics necessary at this time as I do not believe this is bacterial in origin. Her lungs are clear to auscultation today and her recent chest x-ray done a few days ago did not show signs of pneumonia.

## 2013-06-22 NOTE — Assessment & Plan Note (Signed)
Patient reports improvement of her mood since her hospitalization November 13-17 2014. She tells me her mood is good today on her current regimen. I will continue this regimen for now.

## 2013-06-22 NOTE — Patient Instructions (Signed)
Thank you for your visit today, it was nice to meet you!  Please continue taking your medications as you have been. Please check you blood sugar three times per day and bring your glucometer to your next visit. Please also bring all of your medications to your next visit.  I prescribed robitussin DM for your cough today. I believe you have a viral infection that is causing your cough. I think your coughing is causing your chest pain. If your symptoms worsen, please return to clinic or go to the emergency room.  We did a retinal scan today and a foot exam. I referred you to podiatry for your left foot callus and for diabetic shoes. I also referred you to gastroenterology for your difficulty with swallowing and nausea.   Please follow up as needed, otherwise please schedule an appointment with me in 3 months.

## 2013-06-24 ENCOUNTER — Ambulatory Visit (INDEPENDENT_AMBULATORY_CARE_PROVIDER_SITE_OTHER): Payer: Medicare Other | Admitting: Podiatry

## 2013-06-24 ENCOUNTER — Encounter: Payer: Self-pay | Admitting: Podiatry

## 2013-06-24 VITALS — BP 146/90 | HR 66 | Resp 18

## 2013-06-24 DIAGNOSIS — Q828 Other specified congenital malformations of skin: Secondary | ICD-10-CM

## 2013-06-24 DIAGNOSIS — B351 Tinea unguium: Secondary | ICD-10-CM

## 2013-06-24 DIAGNOSIS — M79609 Pain in unspecified limb: Secondary | ICD-10-CM

## 2013-06-24 NOTE — Progress Notes (Signed)
Case discussed with Dr. Mechele Claude at the time of the visit.  We reviewed the resident's history and exam and pertinent patient test results.  I agree with the assessment, diagnosis, and plan of care documented in the resident's note.  Please note correction: Problem is dysphagia, not dysphasia.

## 2013-06-24 NOTE — Progress Notes (Signed)
   Subjective:    Patient ID: [name], female    DOB: [birth year], 58 y.o.   MRN: [id number]  HPI I have a callus on the ball of my left foot and burns and throbbing and feels like a pin sticking when I am walking and I am a diabetic and been one since 2006 and numbness and tingling and some swelling and my nails are long and thick    Review of Systems  Constitutional: Positive for activity change.  HENT:       Sneezing   Eyes: Positive for visual disturbance.  Respiratory: Positive for wheezing.   Musculoskeletal: Positive for back pain and gait problem.       Joint pain  Skin:       Change in nails  Hematological: Bruises/bleeds easily.       Slow to heal  All other systems reviewed and are negative.       Objective:   Physical Exam Orientated x33 58 year old black female  Vascular: The DP and PT pulses are 2/4 bilaterally  Neurological: Sensation detained in monofilament wire intact 56/10 right and 7/10 left. Vibratory sensation intact bilaterally. Knee and ankle reflexes trace reactive bilaterally.  Dermatological: Hypertrophic, elongated, discolored toenails with palpable tenderness in all 10 nail plates.  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 At discharge patient was interested in obtaining diabetic shoes. We'll discuss this at a future visit. Reappoint x3 months

## 2013-06-24 NOTE — Patient Instructions (Signed)
Diabetes and Foot Care Diabetes may cause you to have problems because of poor blood supply (circulation) to your feet and legs. This may cause the skin on your feet to become thinner, break easier, and heal more slowly. Your skin may become dry, and the skin may peel and crack. You may also have nerve damage in your legs and feet causing decreased feeling in them. You may not notice minor injuries to your feet that could lead to infections or more serious problems. Taking care of your feet is one of the most important things you can do for yourself.  HOME CARE INSTRUCTIONS  Wear shoes at all times, even in the house. Do not go barefoot. Bare feet are easily injured.  Check your feet daily for blisters, cuts, and redness. If you cannot see the bottom of your feet, use a mirror or ask someone for help.  Wash your feet with warm water (do not use hot water) and mild soap. Then pat your feet and the areas between your toes until they are completely dry. Do not soak your feet as this can dry your skin.  Apply a moisturizing lotion or petroleum jelly (that does not contain alcohol and is unscented) to the skin on your feet and to dry, brittle toenails. Do not apply lotion between your toes.  Trim your toenails straight across. Do not dig under them or around the cuticle. File the edges of your nails with an emery board or nail file.  Do not cut corns or calluses or try to remove them with medicine.  Wear clean socks or stockings every day. Make sure they are not too tight. Do not wear knee-high stockings since they may decrease blood flow to your legs.  Wear shoes that fit properly and have enough cushioning. To break in new shoes, wear them for just a few hours a day. This prevents you from injuring your feet. Always look in your shoes before you put them on to be sure there are no objects inside.  Do not cross your legs. This may decrease the blood flow to your feet.  If you find a minor scrape,  cut, or break in the skin on your feet, keep it and the skin around it clean and dry. These areas may be cleansed with mild soap and water. Do not cleanse the area with peroxide, alcohol, or iodine.  When you remove an adhesive bandage, be sure not to damage the skin around it.  If you have a wound, look at it several times a day to make sure it is healing.  Do not use heating pads or hot water bottles. They may burn your skin. If you have lost feeling in your feet or legs, you may not know it is happening until it is too late.  Make sure your health care provider performs a complete foot exam at least annually or more often if you have foot problems. Report any cuts, sores, or bruises to your health care provider immediately. SEEK MEDICAL CARE IF:   You have an injury that is not healing.  You have cuts or breaks in the skin.  You have an ingrown nail.  You notice redness on your legs or feet.  You feel burning or tingling in your legs or feet.  You have pain or cramps in your legs and feet.  Your legs or feet are numb.  Your feet always feel cold. SEEK IMMEDIATE MEDICAL CARE IF:   There is increasing redness,   swelling, or pain in or around a wound.  There is a red line that goes up your leg.  Pus is coming from a wound.  You develop a fever or as directed by your health care provider.  You notice a bad smell coming from an ulcer or wound. Document Released: 05/11/2000 Document Revised: 01/14/2013 Document Reviewed: 10/21/2012 [employer] Patient Information 2014 [employer].  

## 2013-06-29 NOTE — Addendum Note (Signed)
Addended by: Rebecca Eaton E on: 06/29/2013 01:21 PM   Modules accepted: Orders

## 2013-06-30 NOTE — Addendum Note (Signed)
Addended by: Orson Gear on: 06/30/2013 03:08 PM   Modules accepted: Orders

## 2013-07-20 ENCOUNTER — Inpatient Hospital Stay (HOSPITAL_COMMUNITY)
Admission: EM | Admit: 2013-07-20 | Discharge: 2013-07-22 | DRG: 638 | Disposition: A | Discharge status: Home / Self Care | Payer: PRIVATE HEALTH INSURANCE | Attending: Internal Medicine | Admitting: Internal Medicine

## 2013-07-20 ENCOUNTER — Encounter (HOSPITAL_COMMUNITY): Payer: Self-pay | Admitting: Emergency Medicine

## 2013-07-20 ENCOUNTER — Emergency Department (HOSPITAL_COMMUNITY): Payer: PRIVATE HEALTH INSURANCE

## 2013-07-20 DIAGNOSIS — B3731 Acute candidiasis of vulva and vagina: Secondary | ICD-10-CM | POA: Diagnosis present

## 2013-07-20 DIAGNOSIS — F3289 Other specified depressive episodes: Secondary | ICD-10-CM | POA: Diagnosis present

## 2013-07-20 DIAGNOSIS — E119 Type 2 diabetes mellitus without complications: Secondary | ICD-10-CM

## 2013-07-20 DIAGNOSIS — E1149 Type 2 diabetes mellitus with other diabetic neurological complication: Secondary | ICD-10-CM | POA: Diagnosis present

## 2013-07-20 DIAGNOSIS — IMO0002 Reserved for concepts with insufficient information to code with codable children: Secondary | ICD-10-CM | POA: Diagnosis present

## 2013-07-20 DIAGNOSIS — Z7982 Long term (current) use of aspirin: Secondary | ICD-10-CM

## 2013-07-20 DIAGNOSIS — Z79899 Other long term (current) drug therapy: Secondary | ICD-10-CM

## 2013-07-20 DIAGNOSIS — G629 Polyneuropathy, unspecified: Secondary | ICD-10-CM | POA: Diagnosis present

## 2013-07-20 DIAGNOSIS — H409 Unspecified glaucoma: Secondary | ICD-10-CM | POA: Diagnosis present

## 2013-07-20 DIAGNOSIS — G8929 Other chronic pain: Secondary | ICD-10-CM | POA: Diagnosis present

## 2013-07-20 DIAGNOSIS — M109 Gout, unspecified: Secondary | ICD-10-CM | POA: Diagnosis present

## 2013-07-20 DIAGNOSIS — F1410 Cocaine abuse, uncomplicated: Secondary | ICD-10-CM | POA: Diagnosis present

## 2013-07-20 DIAGNOSIS — Z598 Other problems related to housing and economic circumstances: Secondary | ICD-10-CM

## 2013-07-20 DIAGNOSIS — N183 Chronic kidney disease, stage 3 unspecified: Secondary | ICD-10-CM | POA: Diagnosis present

## 2013-07-20 DIAGNOSIS — F200 Paranoid schizophrenia: Secondary | ICD-10-CM | POA: Diagnosis present

## 2013-07-20 DIAGNOSIS — Z8673 Personal history of transient ischemic attack (TIA), and cerebral infarction without residual deficits: Secondary | ICD-10-CM

## 2013-07-20 DIAGNOSIS — M549 Dorsalgia, unspecified: Secondary | ICD-10-CM | POA: Diagnosis present

## 2013-07-20 DIAGNOSIS — E1142 Type 2 diabetes mellitus with diabetic polyneuropathy: Secondary | ICD-10-CM | POA: Diagnosis present

## 2013-07-20 DIAGNOSIS — F329 Major depressive disorder, single episode, unspecified: Secondary | ICD-10-CM | POA: Diagnosis present

## 2013-07-20 DIAGNOSIS — E1110 Type 2 diabetes mellitus with ketoacidosis without coma: Secondary | ICD-10-CM | POA: Diagnosis present

## 2013-07-20 DIAGNOSIS — E039 Hypothyroidism, unspecified: Secondary | ICD-10-CM | POA: Diagnosis present

## 2013-07-20 DIAGNOSIS — F17200 Nicotine dependence, unspecified, uncomplicated: Secondary | ICD-10-CM | POA: Diagnosis present

## 2013-07-20 DIAGNOSIS — I10 Essential (primary) hypertension: Secondary | ICD-10-CM

## 2013-07-20 DIAGNOSIS — E1165 Type 2 diabetes mellitus with hyperglycemia: Secondary | ICD-10-CM

## 2013-07-20 DIAGNOSIS — J4489 Other specified chronic obstructive pulmonary disease: Secondary | ICD-10-CM | POA: Diagnosis present

## 2013-07-20 DIAGNOSIS — B373 Candidiasis of vulva and vagina: Secondary | ICD-10-CM | POA: Diagnosis present

## 2013-07-20 DIAGNOSIS — Z9119 Patient's noncompliance with other medical treatment and regimen: Secondary | ICD-10-CM

## 2013-07-20 DIAGNOSIS — I129 Hypertensive chronic kidney disease with stage 1 through stage 4 chronic kidney disease, or unspecified chronic kidney disease: Secondary | ICD-10-CM | POA: Diagnosis present

## 2013-07-20 DIAGNOSIS — M6281 Muscle weakness (generalized): Secondary | ICD-10-CM | POA: Diagnosis present

## 2013-07-20 DIAGNOSIS — E1310 Other specified diabetes mellitus with ketoacidosis without coma: Principal | ICD-10-CM | POA: Diagnosis present

## 2013-07-20 DIAGNOSIS — N390 Urinary tract infection, site not specified: Secondary | ICD-10-CM | POA: Diagnosis present

## 2013-07-20 DIAGNOSIS — K5900 Constipation, unspecified: Secondary | ICD-10-CM | POA: Diagnosis present

## 2013-07-20 DIAGNOSIS — Z9181 History of falling: Secondary | ICD-10-CM

## 2013-07-20 DIAGNOSIS — J449 Chronic obstructive pulmonary disease, unspecified: Secondary | ICD-10-CM | POA: Diagnosis present

## 2013-07-20 DIAGNOSIS — R739 Hyperglycemia, unspecified: Secondary | ICD-10-CM | POA: Diagnosis present

## 2013-07-20 DIAGNOSIS — F411 Generalized anxiety disorder: Secondary | ICD-10-CM | POA: Diagnosis present

## 2013-07-20 DIAGNOSIS — Z794 Long term (current) use of insulin: Secondary | ICD-10-CM

## 2013-07-20 DIAGNOSIS — Z91199 Patient's noncompliance with other medical treatment and regimen due to unspecified reason: Secondary | ICD-10-CM

## 2013-07-20 DIAGNOSIS — Z5987 Material hardship due to limited financial resources, not elsewhere classified: Secondary | ICD-10-CM

## 2013-07-20 DIAGNOSIS — E785 Hyperlipidemia, unspecified: Secondary | ICD-10-CM | POA: Diagnosis present

## 2013-07-20 LAB — COMPREHENSIVE METABOLIC PANEL
ALBUMIN: 3.4 g/dL — AB (ref 3.5–5.2)
ALK PHOS: 159 U/L — AB (ref 39–117)
ALT: 15 U/L (ref 0–35)
AST: 19 U/L (ref 0–37)
BUN: 12 mg/dL (ref 6–23)
CO2: 21 meq/L (ref 19–32)
Calcium: 9.2 mg/dL (ref 8.4–10.5)
Chloride: 90 mEq/L — ABNORMAL LOW (ref 96–112)
Creatinine, Ser: 1.14 mg/dL — ABNORMAL HIGH (ref 0.50–1.10)
GFR calc Af Amer: 62 mL/min — ABNORMAL LOW (ref >90)
GFR calc non Af Amer: 53 mL/min — ABNORMAL LOW (ref >90)
Glucose, Bld: 678 mg/dL (ref 70–99)
Potassium: 4 mEq/L (ref 3.7–5.3)
Sodium: 129 mEq/L — ABNORMAL LOW (ref 137–147)
Total Bilirubin: 0.3 mg/dL (ref 0.3–1.2)
Total Protein: 7.1 g/dL (ref 6.0–8.3)

## 2013-07-20 LAB — GLUCOSE, CAPILLARY
Glucose-Capillary: 152 mg/dL — ABNORMAL HIGH (ref 70–99)
Glucose-Capillary: 234 mg/dL — ABNORMAL HIGH (ref 70–99)

## 2013-07-20 LAB — CBC WITH DIFFERENTIAL/PLATELET
BASOS ABS: 0 10*3/uL (ref 0.0–0.1)
Basophils Relative: 0 % (ref 0–1)
Eosinophils Absolute: 0.1 10*3/uL (ref 0.0–0.7)
Eosinophils Relative: 1 % (ref 0–5)
HCT: 41.2 % (ref 36.0–46.0)
Hemoglobin: 13.9 g/dL (ref 12.0–15.0)
LYMPHS PCT: 30 % (ref 12–46)
Lymphs Abs: 1.5 10*3/uL (ref 0.7–4.0)
MCH: 29.1 pg (ref 26.0–34.0)
MCHC: 33.7 g/dL (ref 30.0–36.0)
MCV: 86.4 fL (ref 78.0–100.0)
MONO ABS: 0.3 10*3/uL (ref 0.1–1.0)
Monocytes Relative: 6 % (ref 3–12)
Neutro Abs: 3.1 10*3/uL (ref 1.7–7.7)
Neutrophils Relative %: 63 % (ref 43–77)
Platelets: 304 10*3/uL (ref 150–400)
RBC: 4.77 MIL/uL (ref 3.87–5.11)
RDW: 16 % — AB (ref 11.5–15.5)
WBC: 4.9 10*3/uL (ref 4.0–10.5)

## 2013-07-20 LAB — URINALYSIS, ROUTINE W REFLEX MICROSCOPIC
Bilirubin Urine: NEGATIVE
KETONES UR: NEGATIVE mg/dL
Nitrite: NEGATIVE
Protein, ur: NEGATIVE mg/dL
Specific Gravity, Urine: 1.035 — ABNORMAL HIGH (ref 1.005–1.030)
Urobilinogen, UA: 0.2 mg/dL (ref 0.0–1.0)
pH: 5.5 (ref 5.0–8.0)

## 2013-07-20 LAB — URINE MICROSCOPIC-ADD ON

## 2013-07-20 LAB — I-STAT TROPONIN, ED: TROPONIN I, POC: 0 ng/mL (ref 0.00–0.08)

## 2013-07-20 LAB — CBG MONITORING, ED
Glucose-Capillary: 599 mg/dL (ref 70–99)
Glucose-Capillary: 470 mg/dL — ABNORMAL HIGH (ref 70–99)
Glucose-Capillary: 292 mg/dL — ABNORMAL HIGH (ref 70–99)
Glucose-Capillary: 211 mg/dL — ABNORMAL HIGH (ref 70–99)
Glucose-Capillary: 154 mg/dL — ABNORMAL HIGH (ref 70–99)
Glucose-Capillary: 125 mg/dL — ABNORMAL HIGH (ref 70–99)

## 2013-07-20 LAB — BASIC METABOLIC PANEL
BUN: 12 mg/dL (ref 6–23)
BUN: 12 mg/dL (ref 6–23)
CALCIUM: 8.5 mg/dL (ref 8.4–10.5)
CO2: 26 mEq/L (ref 19–32)
CO2: 22 mEq/L (ref 19–32)
Calcium: 8.2 mg/dL — ABNORMAL LOW (ref 8.4–10.5)
Chloride: 103 mEq/L (ref 96–112)
Chloride: 103 mEq/L (ref 96–112)
Creatinine, Ser: 1.04 mg/dL (ref 0.50–1.10)
Creatinine, Ser: 0.97 mg/dL (ref 0.50–1.10)
GFR calc Af Amer: 69 mL/min — ABNORMAL LOW (ref >90)
GFR, EST AFRICAN AMERICAN: 75 mL/min — AB (ref >90)
GFR, EST NON AFRICAN AMERICAN: 59 mL/min — AB (ref >90)
GFR, EST NON AFRICAN AMERICAN: 65 mL/min — AB (ref >90)
GLUCOSE: 147 mg/dL — AB (ref 70–99)
Glucose, Bld: 174 mg/dL — ABNORMAL HIGH (ref 70–99)
Potassium: 4 mEq/L (ref 3.7–5.3)
Potassium: 4.8 mEq/L (ref 3.7–5.3)
SODIUM: 141 meq/L (ref 137–147)
Sodium: 139 mEq/L (ref 137–147)

## 2013-07-20 LAB — TROPONIN I: Troponin I: <0.3 ng/mL (ref <0.30)

## 2013-07-20 LAB — SALICYLATE LEVEL

## 2013-07-20 LAB — LIPASE, BLOOD: LIPASE: 36 U/L (ref 11–59)

## 2013-07-20 LAB — LACTIC ACID, PLASMA: LACTIC ACID, VENOUS: 1.1 mmol/L (ref 0.5–2.2)

## 2013-07-20 LAB — MRSA PCR SCREENING: MRSA by PCR: NEGATIVE

## 2013-07-20 LAB — ETHANOL: Alcohol, Ethyl (B): <11 mg/dL (ref 0–11)

## 2013-07-20 MED ORDER — INSULIN REGULAR BOLUS VIA INFUSION
0.0000 [IU] | Freq: Three times a day (TID) | INTRAVENOUS | Status: DC
  Filled 2013-07-20: qty 10

## 2013-07-20 MED ORDER — MOMETASONE FURO-FORMOTEROL FUM 100-5 MCG/ACT IN AERO
2.0000 | INHALATION_SPRAY | Freq: Two times a day (BID) | RESPIRATORY_TRACT | Status: DC
  Administered 2013-07-20 – 2013-07-22 (×3): 2 via RESPIRATORY_TRACT
  Filled 2013-07-20 (×2): qty 8.8

## 2013-07-20 MED ORDER — LATANOPROST 0.005 % OP SOLN
1.0000 [drp] | Freq: Every day | OPHTHALMIC | Status: DC
  Administered 2013-07-21: 1 [drp] via OPHTHALMIC
  Filled 2013-07-20 (×2): qty 2.5

## 2013-07-20 MED ORDER — BRIMONIDINE TARTRATE 0.2 % OP SOLN
1.0000 [drp] | Freq: Two times a day (BID) | OPHTHALMIC | Status: DC
  Administered 2013-07-20 – 2013-07-22 (×4): 1 [drp] via OPHTHALMIC
  Filled 2013-07-20 (×2): qty 5

## 2013-07-20 MED ORDER — SODIUM CHLORIDE 0.9 % IV SOLN: INTRAVENOUS | Status: DC

## 2013-07-20 MED ORDER — AMLODIPINE BESYLATE 5 MG PO TABS
5.0000 mg | ORAL_TABLET | Freq: Every day | ORAL | Status: DC
  Administered 2013-07-20 – 2013-07-22 (×3): 5 mg via ORAL
  Filled 2013-07-20 (×3): qty 1

## 2013-07-20 MED ORDER — DEXTROSE-NACL 5-0.45 % IV SOLN: INTRAVENOUS | Status: DC

## 2013-07-20 MED ORDER — OXYCODONE HCL 5 MG PO CAPS: 10.0000 mg | ORAL_CAPSULE | ORAL | Status: DC | PRN

## 2013-07-20 MED ORDER — DEXTROSE-NACL 5-0.45 % IV SOLN
INTRAVENOUS | Status: DC
  Administered 2013-07-20: 15:00:00 via INTRAVENOUS

## 2013-07-20 MED ORDER — INSULIN GLARGINE 100 UNIT/ML ~~LOC~~ SOLN
10.0000 [IU] | Freq: Every day | SUBCUTANEOUS | Status: DC
  Administered 2013-07-20: 10 [IU] via SUBCUTANEOUS
  Filled 2013-07-20 (×2): qty 0.1

## 2013-07-20 MED ORDER — ALBUTEROL SULFATE (2.5 MG/3ML) 0.083% IN NEBU
2.5000 mg | INHALATION_SOLUTION | Freq: Four times a day (QID) | RESPIRATORY_TRACT | Status: DC | PRN
  Administered 2013-07-21: 2.5 mg via RESPIRATORY_TRACT
  Filled 2013-07-20: qty 3

## 2013-07-20 MED ORDER — SODIUM CHLORIDE 0.9 % IV SOLN
INTRAVENOUS | Status: DC
  Administered 2013-07-20: 4.1 [IU]/h via INTRAVENOUS
  Filled 2013-07-20: qty 1

## 2013-07-20 MED ORDER — LEVOTHYROXINE SODIUM 150 MCG PO TABS
300.0000 ug | ORAL_TABLET | Freq: Every day | ORAL | Status: DC
  Administered 2013-07-21: 300 ug via ORAL
  Filled 2013-07-20 (×2): qty 2

## 2013-07-20 MED ORDER — OXYCODONE HCL 5 MG PO TABS
10.0000 mg | ORAL_TABLET | ORAL | Status: DC | PRN
  Administered 2013-07-20 – 2013-07-22 (×9): 10 mg via ORAL
  Filled 2013-07-20 (×10): qty 2

## 2013-07-20 MED ORDER — NICOTINE 14 MG/24HR TD PT24
14.0000 mg | MEDICATED_PATCH | Freq: Every day | TRANSDERMAL | Status: DC
  Administered 2013-07-20 – 2013-07-22 (×3): 14 mg via TRANSDERMAL
  Filled 2013-07-20 (×4): qty 1

## 2013-07-20 MED ORDER — INSULIN REGULAR HUMAN 100 UNIT/ML IJ SOLN
INTRAMUSCULAR | Status: DC
  Administered 2013-07-20: 0.9 [IU]/h via INTRAVENOUS
  Filled 2013-07-20: qty 1

## 2013-07-20 MED ORDER — DEXTROSE 50 % IV SOLN: 25.0000 mL | INTRAVENOUS | Status: DC | PRN

## 2013-07-20 MED ORDER — SODIUM CHLORIDE 0.9 % IV SOLN
INTRAVENOUS | Status: DC
  Administered 2013-07-20: 12:00:00 via INTRAVENOUS

## 2013-07-20 MED ORDER — IPRATROPIUM-ALBUTEROL 0.5-2.5 (3) MG/3ML IN SOLN
3.0000 mL | RESPIRATORY_TRACT | Status: DC | PRN
  Administered 2013-07-20 – 2013-07-21 (×2): 3 mL via RESPIRATORY_TRACT
  Filled 2013-07-20 (×2): qty 3

## 2013-07-20 MED ORDER — SODIUM CHLORIDE 0.9 % IV SOLN
INTRAVENOUS | Status: DC
  Administered 2013-07-20: 19:00:00 via INTRAVENOUS

## 2013-07-20 MED ORDER — ONDANSETRON HCL 4 MG/2ML IJ SOLN
4.0000 mg | Freq: Once | INTRAMUSCULAR | Status: AC
  Administered 2013-07-20: 4 mg via INTRAVENOUS
  Filled 2013-07-20: qty 2

## 2013-07-20 MED ORDER — NITROGLYCERIN 0.4 MG SL SUBL: 0.4000 mg | SUBLINGUAL_TABLET | SUBLINGUAL | Status: DC | PRN

## 2013-07-20 MED ORDER — MORPHINE SULFATE 4 MG/ML IJ SOLN
4.0000 mg | Freq: Once | INTRAMUSCULAR | Status: AC
  Administered 2013-07-20: 4 mg via INTRAVENOUS
  Filled 2013-07-20: qty 1

## 2013-07-20 MED ORDER — POTASSIUM CHLORIDE 10 MEQ/100ML IV SOLN
10.0000 meq | INTRAVENOUS | Status: AC
  Administered 2013-07-20 (×2): 10 meq via INTRAVENOUS
  Filled 2013-07-20: qty 100

## 2013-07-20 MED ORDER — ASPIRIN EC 81 MG PO TBEC
81.0000 mg | DELAYED_RELEASE_TABLET | Freq: Every day | ORAL | Status: DC
  Administered 2013-07-20 – 2013-07-22 (×3): 81 mg via ORAL
  Filled 2013-07-20 (×3): qty 1

## 2013-07-20 MED ORDER — INSULIN ASPART 100 UNIT/ML ~~LOC~~ SOLN
0.0000 [IU] | SUBCUTANEOUS | Status: DC
  Administered 2013-07-21: 3 [IU] via SUBCUTANEOUS
  Administered 2013-07-21: 2 [IU] via SUBCUTANEOUS

## 2013-07-20 MED ORDER — DEXTROSE 5 % IV SOLN
1.0000 g | Freq: Once | INTRAVENOUS | Status: DC
  Filled 2013-07-20: qty 10

## 2013-07-20 MED ORDER — DEXTROSE 5 % IV SOLN
1.0000 g | Freq: Once | INTRAVENOUS | Status: AC
  Administered 2013-07-20: 1 g via INTRAVENOUS
  Filled 2013-07-20: qty 10

## 2013-07-20 MED ORDER — HEPARIN SODIUM (PORCINE) 5000 UNIT/ML IJ SOLN
5000.0000 [IU] | Freq: Three times a day (TID) | INTRAMUSCULAR | Status: DC
  Administered 2013-07-20 – 2013-07-22 (×5): 5000 [IU] via SUBCUTANEOUS
  Filled 2013-07-20 (×8): qty 1

## 2013-07-20 MED ORDER — SODIUM CHLORIDE 0.9 % IV BOLUS (SEPSIS)
1000.0000 mL | Freq: Once | INTRAVENOUS | Status: AC
Start: 2013-07-20 — End: 2013-07-20
  Administered 2013-07-20: 1000 mL via INTRAVENOUS

## 2013-07-20 MED ORDER — HALOPERIDOL 5 MG PO TABS
5.0000 mg | ORAL_TABLET | Freq: Every day | ORAL | Status: DC
  Filled 2013-07-20: qty 1

## 2013-07-20 NOTE — ED Provider Notes (Addendum)
CSN: [id number]     Arrival date & time 07/20/13  [id number] History   First MD Initiated Contact with Patient 07/20/13 [phone]     Chief Complaint  Patient presents with  . Hyperglycemia  . Fall     (Consider location/radiation/quality/duration/timing/severity/associated sxs/prior Treatment) The history is provided by the patient.  [name] is a 58 y.o. female hx of DM, pancreatitis, HTN, HL, UTI here with diffuse weakness, hyperglycemia. Diffuse weakness over the last 3 days. She noticed that her low sugar has been running in the 300s and today was reading high. She was weak and her leg gave out and she slid down 3 steps and fell and hit her head. She is complaining of back pain afterwards. Denies any syncope. She denies any nausea or vomiting or abdominal pain but does have some urinary frequency and history of urinary tract infections.    Past Medical History  Diagnosis Date  . Asthma     past 10 years  . Diabetes mellitus     past 5 years  . Glaucoma     dx 06/2011-[facility]  . Hypertension   . Hyperlipidemia   . Hyperthyroidism   . History of TIA (transient ischemic attack)     01/06/2011, x2  . Pancreatitis   . Gout   . Depression   . Renal disorder     chronic kidney disease, stage 3  . Chronic pain   . Glaucoma   . Arthritis     djd -lower back, left knee  . Degenerative disc disease   . Bipolar disorder   . Gastritis   . COPD (chronic obstructive pulmonary disease)   . Schizophrenia   . 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 Colon polyps     colonoscopy 01/2013   . Fatty liver     mild CT 06/2012   Past Surgical History  Procedure Laterality Date  . Vaginal hysterectomy  1990  . Abdominal hysterectomy      still has ovaries    Family History  Problem Relation Age of Onset  . Arthritis Mother   . Diabetes Mother   . Hypertension Mother   . Kidney disease Mother   . Heart disease Mother     died 10  . Heart disease      mother &  father  . Hypertension      mother & father  . Colon cancer Neg Hx   . Prostate cancer Neg Hx   . Breast cancer Neg Hx   . Stomach cancer Neg Hx   . Hypertension Sister   . Hypertension Brother   . Kidney disease Brother    History  Substance Use Topics  . Smoking status: Current Some Day Smoker -- 0.01 packs/day for 30 years    Types: Cigarettes  . Smokeless tobacco: Never Used     Comment: smokes 2 cigs daily/uses patches.  . Alcohol Use: No     Comment: denied alcohol at this time   OB History   Grav Para Term Preterm Abortions TAB SAB Ect Mult Living                 Review of Systems  Genitourinary: Positive for frequency.  Musculoskeletal: Positive for back pain.  Neurological: Positive for weakness.  All other systems reviewed and are negative.      Allergies  Hydrocodone-acetaminophen and Metformin  Home Medications   Current Outpatient Rx  Name  Route  Sig  Dispense  Refill  . albuterol (PROVENTIL HFA;VENTOLIN HFA) 108 (90 BASE) MCG/ACT inhaler   Inhalation   Inhale 2 puffs into the lungs every 6 (six) hours as needed for wheezing.   1 Inhaler   2   . allopurinol (ZYLOPRIM) 300 MG tablet   Oral   Take 1 tablet (300 mg total) by mouth daily. For gout.         Marland Kitchen amLODipine (NORVASC) 5 MG tablet   Oral   Take 1 tablet (5 mg total) by mouth daily. For hypertension.   30 tablet   0   . aspirin EC 81 MG tablet   Oral   Take 1 tablet (81 mg total) by mouth daily. For platelet aggregation.   30 tablet   0   . atorvastatin (LIPITOR) 80 MG tablet   Oral   Take 1 tablet (80 mg total) by mouth every morning.         . brimonidine (ALPHAGAN) 0.2 % ophthalmic solution   Both Eyes   Place 1 drop into both eyes 2 (two) times daily.   5 mL   12   . cephALEXin (KEFLEX) 500 MG capsule   Oral   Take 1 capsule (500 mg total) by mouth 3 (three) times daily.   21 capsule   0   . colchicine 0.6 MG tablet   Oral   Take 1 tablet (0.6 mg total) by  mouth daily as needed (for gout flare-ups).         . Fluticasone-Salmeterol (ADVAIR DISKUS) 250-50 MCG/DOSE AEPB   Inhalation   Inhale 1 puff into the lungs 2 times daily at 12 noon and 4 pm.   60 each   0   . gabapentin (NEURONTIN) 100 MG capsule      Take 3 (100mg ) capsules for a dose of (300mg ) three times each day for neuropathic pain and anxiety.   270 capsule   0     Dispense as written.   Marland Kitchen guaiFENesin-dextromethorphan (ROBITUSSIN DM) 100-10 MG/5ML syrup   Oral   Take 5 mLs by mouth every 4 (four) hours as needed for cough.   118 mL   0   . haloperidol (HALDOL) 5 MG tablet   Oral   Take 1 tablet (5 mg total) by mouth at bedtime. For psychosis and irritability.   30 tablet   0   . insulin glargine (LANTUS) 100 UNIT/ML injection   Subcutaneous   Inject 20 Units into the skin at bedtime.         Marland Kitchen latanoprost (XALATAN) 0.005 % ophthalmic solution   Both Eyes   Place 1 drop into both eyes at bedtime.   2.5 mL   0   . levothyroxine (SYNTHROID, LEVOTHROID) 300 MCG tablet   Oral   Take 1 tablet (300 mcg total) by mouth daily before breakfast.   30 tablet   0   . mometasone-formoterol (DULERA) 100-5 MCG/ACT AERO   Inhalation   Inhale 2 puffs into the lungs 2 (two) times daily.   1 Inhaler      . nitroGLYCERIN (NITROSTAT) 0.4 MG SL tablet   Sublingual   Place 1 tablet (0.4 mg total) under the tongue every 5 (five) minutes as needed for chest pain.   30 tablet   3   . oxycodone (OXY-IR) 5 MG capsule   Oral   Take 10 mg by mouth every 4 (four) hours as needed.         Marland Kitchen  pantoprazole (PROTONIX) 40 MG tablet   Oral   Take 1 tablet (40 mg total) by mouth daily. For reflux.   30 tablet   11   . sertraline (ZOLOFT) 100 MG tablet   Oral   Take 100 mg by mouth daily.         . traZODone (DESYREL) 100 MG tablet   Oral   Take 1 tablet (100 mg total) by mouth at bedtime.   30 tablet   0    BP 133/79  Pulse 71  Temp(Src) 97.7 F (36.5 C)  (Oral)  Resp 14  SpO2 96% Physical Exam  Nursing note and vitals reviewed. Constitutional: She is oriented to person, place, and time.  Tired, dehydrated   HENT:  Head: Normocephalic.  MM dry. Small hematoma posterior scalp.   Eyes: Conjunctivae are normal. Pupils are equal, round, and reactive to light.  Neck: Normal range of motion. Neck supple.  Cardiovascular: Normal rate, regular rhythm and normal heart sounds.   Pulmonary/Chest: Effort normal and breath sounds normal. No respiratory distress. She has no wheezes. She has no rales.  Abdominal: Soft. Bowel sounds are normal. She exhibits no distension. There is no tenderness. There is no rebound and no guarding.  Musculoskeletal: Normal range of motion.  Mild midline lumbar tenderness. ? R paracervical tenderness   Neurological: She is alert and oriented to person, place, and time. No cranial nerve deficit. Coordination normal.  Skin: Skin is warm and dry.  Psychiatric: She has a normal mood and affect. Her behavior is normal. Judgment and thought content normal.    ED Course  Procedures (including critical care time)  CRITICAL CARE Performed by: Darl Householder, Phil Corti   Total critical care time: 30 min   Critical care time was exclusive of separately billable procedures and treating other patients.  Critical care was necessary to treat or prevent imminent or life-threatening deterioration.  Critical care was time spent personally by me on the following activities: development of treatment plan with patient and/or surrogate as well as nursing, discussions with consultants, evaluation of patient's response to treatment, examination of patient, obtaining history from patient or surrogate, ordering and performing treatments and interventions, ordering and review of laboratory studies, ordering and review of radiographic studies, pulse oximetry and re-evaluation of patient's condition.   Labs Review Labs Reviewed  CBC WITH DIFFERENTIAL -  Abnormal; Notable for the following:    RDW 16.0 (*)    All other components within normal limits  COMPREHENSIVE METABOLIC PANEL - Abnormal; Notable for the following:    Sodium 129 (*)    Chloride 90 (*)    Glucose, Bld 678 (*)    Creatinine, Ser 1.14 (*)    Albumin 3.4 (*)    Alkaline Phosphatase 159 (*)    GFR calc non Af Amer 53 (*)    GFR calc Af Amer 62 (*)    All other components within normal limits  URINALYSIS, ROUTINE W REFLEX MICROSCOPIC - Abnormal; Notable for the following:    Specific Gravity, Urine 1.035 (*)    Glucose, UA >1000 (*)    Hgb urine dipstick TRACE (*)    Leukocytes, UA MODERATE (*)    All other components within normal limits  CBG MONITORING, ED - Abnormal; Notable for the following:    Glucose-Capillary 599 (*)    All other components within normal limits  CBG MONITORING, ED - Abnormal; Notable for the following:    Glucose-Capillary 470 (*)    All other components  within normal limits  URINE MICROSCOPIC-ADD ON  Randolm Idol, ED   Imaging Review Dg Chest 2 View  07/20/2013   CLINICAL DATA:  Cough and congestion.  EXAM: CHEST  2 VIEW  COMPARISON:  PA and lateral chest 06/19/2013 and 07/03/2012.  FINDINGS: Mild linear atelectasis or scarring is seen in the left lung base. The lungs are otherwise clear. Heart size is normal. No pneumothorax or pleural effusion.  IMPRESSION: No acute disease.   Electronically Signed   By: Inge Rise M.D.   On: 07/20/2013 11:16   Dg Cervical Spine Complete  07/20/2013   CLINICAL DATA:  Fall.  Right lateral neck pain.  EXAM: CERVICAL SPINE  4+ VIEWS  COMPARISON:  Plain film cervical spine 10/29/2012.  FINDINGS: Vertebral body height and alignment are normal. Intervertebral disc space height is maintained. Prevertebral soft tissues are unremarkable. Lung apices are clear.  IMPRESSION: Negative exam.   Electronically Signed   By: Inge Rise M.D.   On: 07/20/2013 11:17   Dg Lumbar Spine Complete  07/20/2013    CLINICAL DATA:  HYPERGLYCEMIA ,FALL  EXAM: LUMBAR SPINE - COMPLETE 4+ VIEW  COMPARISON:  DG LUMBAR SPINE COMPLETE dated 11/07/2012  FINDINGS: There is no evidence of lumbar spine fracture. There is minimal dextroscoliosis of the upper lumbar spine with a mild rotatory component. Intervertebral disc spaces are maintained.  IMPRESSION: No evidence of acute osseous abnormalities.   Electronically Signed   By: Margaree Mackintosh M.D.   On: 07/20/2013 11:17   Ct Head Wo Contrast  07/20/2013   CLINICAL DATA:  Status post fall  EXAM: CT HEAD WITHOUT CONTRAST  TECHNIQUE: Contiguous axial images were obtained from the base of the skull through the vertex without intravenous contrast.  COMPARISON:  Previous CT scan of the brain dated February 12, 2012.  FINDINGS: The ventricles are normal in size and position. There is no intracranial hemorrhage nor intracranial mass effect. The cerebellum and brainstem are normal in density. There is no evidence of an evolving ischemic infarction.  At bone window settings the observed portions of the paranasal sinuses and mastoid air cells are clear. There is no evidence of an acute skull fracture.  IMPRESSION: 1. There is no evidence of acute ischemic or hemorrhagic infarction. 2. There is no intracranial mass effect or hydrocephalus. 3. 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   Electronically Signed   By: Latica Hohmann  [place]   On: 07/20/2013 10:35    EKG Interpretation    Date/Time:  Monday July 20 2013 10:09:41 EST Ventricular Rate:  73 PR Interval:  148 QRS Duration: 68 QT Interval:  562 QTC Calculation: 619 R Axis:   -13 Text Interpretation:  Sinus rhythm Nonspecific T abnrm, anterolateral leads Prolonged QT interval No significant change since last tracing Confirmed by Shiela Bruns  MD, Greely Atiyeh [phone]) on 07/20/2013 10:11:54 AM            MDM   Final diagnoses:  None  NAJLA AUGHENBAUGH is a 58 y.o. female here with weakness,  hyperglycemia, fall. CBG here was 599. I am concerned for possible DKA vs hyperglycemia from sepsis. Will do sepsis workup. Also will get CT head, xrays since she fell.   11:54 AM CT head, xrays unremarkable. She is in DKA with AG of 18. Insulin drip started. Also has UTI, given ceftriaxone. Will transfer to [facility] on stepdown under internal medicine service.    Wandra Arthurs, MD 07/20/13 [phone]  Wandra Arthurs, MD 07/20/13 [phone]

## 2013-07-20 NOTE — Progress Notes (Signed)
Patient has meds from home that need to be locked up 

## 2013-07-20 NOTE — ED Notes (Signed)
Bed: YT11 Expected date:  Expected time:  Means of arrival:  Comments: ems- cbg high

## 2013-07-20 NOTE — H&P (Signed)
Date: 07/20/2013               Patient Name:  [name] MRN: [id number]  DOB: Apr 18, 1956 Age / Sex: 58 y.o., female   PCP: Rebecca Eaton, MD              Medical Service: Internal Medicine Teaching Service     I have reviewed the note by Morley Kos, MS-4 and was present during the interview and physical exam.  Please see below for findings, assessment, and plan.  Chief Complaint: Weakness w/ fall  History of Present Illness:  58yo F w/ PMH HTN,HLD, cocaine abuse, COPD, uncontrolled DM type 2, hypothyroidism, schizophrenia, depression and anxiety presents s/p fall 2/2 weakness and was found to be hyperglycemia with an anion gap concerning for DKA.  She states that she has been having dizziness and weakness over the past few days to 1 month with frequent falls. She does have a h/o chronic back pain but imaging from the ED is wnl. Pt's DM2 is poorly controlled and she states that her CBGs usually run in the 300s but have been more elevated over the past few days.  She states that she fell today after her legs gave out, hitting her head, and was seen in the [facility] ED. CT of the head was negative as were spinal films. She was found to have a blood glucose of over 600 with an AG of 18 in the ED. She was started on DKA protocol and transferred [facility] for admission.   She denies any chest pain, nausea, or vomiting. She does endorse mid-epigastric pain and suprapubic tenderness, but denies any dysuria or foul smelling urine. She endorses blurry vision and peripheral neuropathy 2/2 to her DM2.  Meds: Current Facility-Administered Medications  Medication Dose Route Frequency Provider Last Rate Last Dose  . 0.9 %  sodium chloride infusion   Intravenous Continuous Otho Bellows, MD 125 mL/hr at 07/20/13 1831    . albuterol (PROVENTIL) (2.5 MG/3ML) 0.083% nebulizer solution 2.5 mg  2.5 mg Inhalation Q6H PRN Otho Bellows, MD      . amLODipine (NORVASC) tablet 5 mg  5 mg Oral Daily Otho Bellows,  MD   5 mg at 07/20/13 2029  . aspirin EC tablet 81 mg  81 mg Oral Daily Otho Bellows, MD   81 mg at 07/20/13 2030  . brimonidine (ALPHAGAN) 0.2 % ophthalmic solution 1 drop  1 drop Both Eyes BID Otho Bellows, MD   1 drop at 07/20/13 2104  . dextrose 5 %-0.45 % sodium chloride infusion   Intravenous Continuous Otho Bellows, MD      . dextrose 50 % solution 25 mL  25 mL Intravenous PRN Otho Bellows, MD      . heparin injection 5,000 Units  5,000 Units Subcutaneous 3 times per day Otho Bellows, MD   5,000 Units at 07/20/13 2105  . insulin regular (NOVOLIN R,HUMULIN R) 1 Units/mL in sodium chloride 0.9 % 100 mL infusion   Intravenous Continuous Otho Bellows, MD 0.9 mL/hr at 07/20/13 2043 0.9 Units/hr at 07/20/13 2043  . ipratropium-albuterol (DUONEB) 0.5-2.5 (3) MG/3ML nebulizer solution 3 mL  3 mL Nebulization Q4H PRN Otho Bellows, MD   3 mL at 07/20/13 2011  . latanoprost (XALATAN) 0.005 % ophthalmic solution 1 drop  1 drop Both Eyes QHS Otho Bellows, MD      . Derrill Memo ON 07/21/2013] levothyroxine (SYNTHROID, LEVOTHROID) tablet 300 mcg  300 mcg Oral QAC breakfast Otho Bellows, MD      . mometasone-formoterol [facility]) 100-5 MCG/ACT inhaler 2 puff  2 puff Inhalation BID Otho Bellows, MD   2 puff at 07/20/13 2012  . nicotine (NICODERM CQ - dosed in mg/24 hours) patch 14 mg  14 mg Transdermal Daily Otho Bellows, MD   14 mg at 07/20/13 2031  . nitroGLYCERIN (NITROSTAT) SL tablet 0.4 mg  0.4 mg Sublingual Q5 min PRN Otho Bellows, MD      . oxyCODONE (Oxy IR/ROXICODONE) immediate release tablet 10 mg  10 mg Oral Q4H PRN Otho Bellows, MD   10 mg at 07/20/13 1832  . potassium chloride 10 mEq in 100 mL IVPB  10 mEq Intravenous Q1H Otho Bellows, MD   10 mEq at 07/20/13 2043    Allergies: Allergies as of 07/20/2013 - Review Complete 07/20/2013  Allergen Reaction Noted  . Hydrocodone-acetaminophen Hives 01/02/2013  . Metformin Diarrhea 01/02/2013    Past Medical  History: Medical Student note reviewed  Family History: Medical Student note reviewed  Social History: Medical Student note reviewed  Surgical History: Medical Student note reviewed  Review of System: Medical Student note reviewed  Physical Exam: Blood pressure 124/89, pulse 74, temperature 97.8 F (36.6 C), temperature source Oral, resp. rate 19, SpO2 95.00%. General: Pleasant female, NAD, and cooperative on examination.  Head: Normocephalic and atraumatic.  Eyes: Pupils equal, round, and reactive to light, no injection and anicteric.  Mouth: Pharynx pink and moist, no erythema or exudates.  Neck: Supple, full ROM, no thyromegaly  Lungs: Normal respiratory effort, no accessory muscle use. Crackles appreciated at the right base, otherwise clear Heart: Regular rate, regular rhythm, no murmur, no gallop, and no rub.  Abdomen: Soft, non-distended, normal bowel sounds, no guarding, no rebound tenderness, no organomegaly. +TTP in midepigastrium with diffuse mild abdominal TTP   Msk: No joint swelling, warmth, or erythema.  Extremities: 2+ radial and DP pulses bilaterally. No cyanosis, clubbing, edema Neurologic: Alert & oriented X3, cranial nerves II-XII intact, strength equal in all extremities, but diffusely diminished 3/5, asymmetric sensation to light touch, with asymmetric sensation to the face, L>R, not acute per pt, and gait not evaluated.  Skin: Turgor normal and no rashes.  Psych: Memory intact for recent and remote, normally interactive, good eye contact, not anxious appearing, and not depressed appearing.  Labs: Reviewed as noted in the Electronic Record  Imaging: Reviewed as noted in the Electronic Record  Other results: EKG: Sinus rhythm. Prolonged QTc. Nonspecific T wave changes. Unchanged from previous tracings.  Assessment & Plan by Problem: 58yo F w/ PMH HTN,HLD, cocaine abuse, COPD, uncontrolled DM type 2, hypothyroidism, schizophrenia, depression and anxiety  presents s/p fall 2/2 weakness and was found to be hyperglycemia with an anion gap concerning for DKA.  Hyperglycemia in the setting of increased anion gap:  Pt presents with CBGs>600 in the setting of AG of 18 with a normal bicarbonate level of 21. Her last recorded AG was 19 on 06/19/13 with a bicarb of 20. Per pt, she states that her CBGs are never well controlled and usually run in the 300s each day. She states that she checks her CBGs daily and endorses compliance with her insulin regimen of Lantus 20u qhs and Novolog 4u TID AC, which is not listed in her MAR. While she does have an increased AG in the setting of hyperglycemia, she does not have ketones in her urine, so DKA seems  less likely vs HHS. She has poor control of her DM2 at home, and appears to be chronically hyperglycemic. Given her AG, which was present in January but not in November '14, will check for other causes for the persistent AG. Due to her significantly elevated CBGs, starting glucomander protocol.  - Admit to IMTS to obs - Glucomander protocol - Transition to Lantus once AG closes x2 and CBGs <250 - NPO until starting basal insulin - Checking EtOH, ASA levels, lactic acid, serum osmols - am BMP and CBC  Weakness with fall: Pt states that dizziness/weakness seems to have been presents for the past month, and endorses multiple falls. She has a h/o chronic back pain but her last MRI lumbar spine in the system is w/o nerve root impingement, disk protrusion, and mild osteoarthritis. She states that her CBGs constantly remain elevatedm and this fall is in the setting of elevated CBGs.  CT head and spinal films in the ED were all negative. Pt's dizziness has improved but is still present on exam while lying in bed. Her BP has been well controlled since presenting to the ED, so hypotension is unlikely. She does have a h/o hypothyroidism that has been poorly controlled, so her fall could be related to hypothyroidism. She is on  narcotics, which could also be contributing to her symptoms, but she does not appear oversedated on exam. Likely her weakness/dizziness is from persistently uncontrolled blood glucose levels, which we are working to establish tighter control of this admission, and the fall was likely contributed in part by her diabetic neuropathy and visual deficits 2/2 uncontrolled DM2.   - Checking orthostatics - Checking TSH - Bed rest - Hyperglycemia and AG management as noted above - PT/OT  Leukocytes in urine:  With her recent significant increase in CBGs, a UA was checked in teh ED and was positive for leukocytes. She was treated with IV Rocephin x1 dose in the ED, which was discontinued on admission as the pt was asymptomatic. However, if her CBGs remain elevated or if she becomes symptomatic, will restart the abx, as a UTI could be the cause of her significant hyperglycemia.   H/o pancreatitis: Pt with h/o pancreatitis. She endorses chronic abdominal pain. Mild TTP on exam primarily in the mid-epigastrium. Checking lipase levels, although acute pancreatitis is not likely.   Cocaine abuse: Pt with a h/o cocaine abuse, and states that she last used 3 days ago. She endorses infrequent use, using only a few times a monht.  - SW consult for cessation  HTN: BP well controlled this admission. Pt is on CCB at home, which was continued on admission.  - Amlodipine 5mg  daily  Chronic back pain: Increased pain s/p fall today. Continuing home Oxycodone 10mg  q4h PRN. Pt does not appear over sedated, but will monitor on the Oxycodone IR.   CKD, stage 2: Stable. Cr 1.14, which is around her baseline, which seems to be around 1.10 - am BMP  COPD: Stable. Pt on Advair and Dulera at home. Continued Dulera per formulary.  HLD: Last LDL 139 on 02/23/13. Pt on statin at home which was held on admission as the pt is NPO. Will restart once tolerating a diet.   Depression and schizophrenia: Pt on Haldol, Trazodone, and  Zoloft. Holding Haldol 2/2 QTc prolongation. Holding Zoloft and Trazodone while NPO, but ok to restart once tolerating po meds.   Tobacco abuse: Pt on nicotine patch at home for cessation, which was continued on admission.  - Cessation  counseling - Continue Nicotine patch 14mg  q24h   Dispo: Disposition is deferred at this time, awaiting improvement of current medical problems. Anticipated discharge in approximately 1-2 day(s).   The patient does have a current PCP Rebecca Eaton, MD) and does need an [facility] hospital follow-up appointment after discharge.  The patient does not have transportation limitations that hinder transportation to clinic appointments.    Signed: Otho Bellows, MD 07/20/2013, 9:07 PM

## 2013-07-20 NOTE — ED Notes (Signed)
Per EMs. Pt from home, reports not feeling well for past few days. Hx of DM, ems reports cbg of over 600. Pt also reports fall this am with no loc. Pt states leg gave way, no LOC. Pt was ambulatory after fall.

## 2013-07-20 NOTE — H&P (Signed)
Date: 07/20/2013               Patient Name:  [name] MRN: [id number]  DOB: [birth year] Age / Sex: 58 y.o., female   PCP: Rebecca Eaton, MD              Medical Service: Internal Medicine Teaching Service              Attending Physician: Dr. Oval Linsey, MD    First Contact: Morley Kos, MS4 Pager: [phone]  Second Contact: Dr. Clayburn Pert Pager: [phone]            After Hours (After 5p/  First Contact Pager: [phone]  weekends / holidays): Second Contact Pager: [phone]    Chief Complaint: Diffuse Weakness and Fall  History of Present Illness:  [name] is a 58 year old woman with a PMH of T2DM on insulin, Hypothyroidism, Hypertension, Hyperlipidemia, Paranoid Schizophrenia, and Depression who presented to the [facility] ED this morning (2/23) after a fall she had at home.  She states she has been weak for some time - from her history it is unclear exactly how long.  While she was walking down her steps this morning, she felt woozy and dizzy, and like "her knee gave out."  She fell and hit her head and back.  She does not believe she lost consciousness.  She has fallen before and it usually happens because she is dizzy and woozy.  She came in to [facility] ED, where a CT head showed no acute ischemia or hemorrhagic infarct.  Xrays of her lumbar spine and cervical spine showed no acute osseous abnormalities.  She was found to have a CBG of 678, as well as an AG of 18, and a UA significant for WBCs.  She was thought to be in DKA, precipitated by UTI, and so was started on fluid resuscitation and insulin drip.  On interview upon transfer, she states that she continues to feel a bit woozy.  She checks her blood glucose daily and states it normally runs in the 300s.  She takes Lantus 20U nightly, as well as Novolog 4U TID.  The past several days it has run higher but she cannot remember the exact levels.  She has chronic nausea that she attributes to pancreatitis, but has  not had any acute nausea or vomiting.  She has chronic abdominal pain that she also attributes to pancreatitis that starts in the middle of her stomach and radiates towards her back.  She is experiencing this pain now and feels it's been made worse by her fall.  She states she has several bouts of pancreatitis a year.  She denies any dysuria or hematuria.  She has had increased frequency over the past several days, as well as labial burning with wiping.  She states she has a chest pain that feels "heavy" in the middle of her chest and does not radiate anywhere.  She states that she feels a little short of breath now and attributes that to her asthma and that she currently needs a breathing treatment.  She endorses back and neck pain currently from her fall.  Her eyes do feel a bit itchy and her vision is blurry.  She commonly goes in and out of blurry vision.  Meds: Current Facility-Administered Medications  Medication Dose Route Frequency Provider Last Rate Last Dose  . 0.9 %  sodium chloride infusion   Intravenous Continuous Wandra Arthurs, MD 125 mL/hr at 07/20/13 1146    .  cefTRIAXone (ROCEPHIN) 1 g in dextrose 5 % 50 mL IVPB  1 g Intravenous Once Wandra Arthurs, MD 100 mL/hr at 07/20/13 1150 1 g at 07/20/13 1150  . dextrose 5 %-0.45 % sodium chloride infusion   Intravenous Continuous Wandra Arthurs, MD      . dextrose 50 % solution 25 mL  25 mL Intravenous PRN Wandra Arthurs, MD      . insulin regular (NOVOLIN R,HUMULIN R) 1 Units/mL in sodium chloride 0.9 % 100 mL infusion   Intravenous Continuous Wandra Arthurs, MD 4.1 mL/hr at 07/20/13 1126 4.1 Units/hr at 07/20/13 1126  . insulin regular bolus via infusion 0-10 Units  0-10 Units Intravenous TID WC Wandra Arthurs, MD       Current Outpatient Prescriptions  Medication Sig Dispense Refill  . albuterol (PROVENTIL HFA;VENTOLIN HFA) 108 (90 BASE) MCG/ACT inhaler Inhale 2 puffs into the lungs every 6 (six) hours as needed for wheezing.  1 Inhaler  2  . allopurinol  (ZYLOPRIM) 300 MG tablet Take 1 tablet (300 mg total) by mouth daily. For gout.      Marland Kitchen amLODipine (NORVASC) 5 MG tablet Take 1 tablet (5 mg total) by mouth daily. For hypertension.  30 tablet  0  . aspirin EC 81 MG tablet Take 1 tablet (81 mg total) by mouth daily. For platelet aggregation.  30 tablet  0  . atorvastatin (LIPITOR) 80 MG tablet Take 1 tablet (80 mg total) by mouth every morning.      . brimonidine (ALPHAGAN) 0.2 % ophthalmic solution Place 1 drop into both eyes 2 (two) times daily.  5 mL  12  . colchicine 0.6 MG tablet Take 1 tablet (0.6 mg total) by mouth daily as needed (for gout flare-ups).      . Fluticasone-Salmeterol (ADVAIR DISKUS) 250-50 MCG/DOSE AEPB Inhale 1 puff into the lungs 2 times daily at 12 noon and 4 pm.  60 each  0  . gabapentin (NEURONTIN) 100 MG capsule Take 3 (139m) capsules for a dose of (3040m three times each day for neuropathic pain and anxiety.  270 capsule  0  . haloperidol (HALDOL) 5 MG tablet Take 1 tablet (5 mg total) by mouth at bedtime. For psychosis and irritability.  30 tablet  0  . insulin glargine (LANTUS) 100 UNIT/ML injection Inject 20 Units into the skin at bedtime.      . Marland Kitchenatanoprost (XALATAN) 0.005 % ophthalmic solution Place 1 drop into both eyes at bedtime.  2.5 mL  0  . levothyroxine (SYNTHROID, LEVOTHROID) 300 MCG tablet Take 1 tablet (300 mcg total) by mouth daily before breakfast.  30 tablet  0  . mometasone-formoterol (DULERA) 100-5 MCG/ACT AERO Inhale 2 puffs into the lungs 2 (two) times daily.  1 Inhaler    . nicotine (NICODERM CQ - DOSED IN MG/24 HOURS) 14 mg/24hr patch Place 14 mg onto the skin daily.      . nitroGLYCERIN (NITROSTAT) 0.4 MG SL tablet Place 1 tablet (0.4 mg total) under the tongue every 5 (five) minutes as needed for chest pain.  30 tablet  3  . oxycodone (OXY-IR) 5 MG capsule Take 10 mg by mouth every 4 (four) hours as needed.      . pantoprazole (PROTONIX) 40 MG tablet Take 1 tablet (40 mg total) by mouth daily.  For reflux.  30 tablet  11  . sertraline (ZOLOFT) 100 MG tablet Take 100 mg by mouth daily.      . traZODone (  DESYREL) 100 MG tablet Take 1 tablet (100 mg total) by mouth at bedtime.  30 tablet  0     Allergies: Allergies as of 07/20/2013 - Review Complete 07/20/2013  Allergen Reaction Noted  . Hydrocodone-acetaminophen Hives 01/02/2013  . Metformin Diarrhea 01/02/2013     Past Medical History: Past Medical History  Diagnosis Date  . Asthma     past 10 years  . Diabetes mellitus     past 5 years  . Glaucoma     dx 06/2011-[facility]  . Hypertension   . Hyperlipidemia   . Hyperthyroidism   . History of TIA (transient ischemic attack)     01/06/2011, x2  . Pancreatitis   . Gout   . Depression   . Renal disorder     chronic kidney disease, stage 3  . Chronic pain   . Glaucoma   . Arthritis     djd -lower back, left knee  . Degenerative disc disease   . Bipolar disorder   . Gastritis   . COPD (chronic obstructive pulmonary disease)   . Schizophrenia   . 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 Colon polyps     colonoscopy 01/2013   . Fatty liver     mild CT 06/2012     Past Surgical History: Past Surgical History  Procedure Laterality Date  . Vaginal hysterectomy  1990  . Abdominal hysterectomy      still has ovaries      Family History: Family History  Problem Relation Age of Onset  . Arthritis Mother   . Diabetes Mother   . Hypertension Mother   . Kidney disease Mother   . Heart disease Mother     died 94  . Heart disease      mother & father  . Hypertension      mother & father  . Colon cancer Neg Hx   . Prostate cancer Neg Hx   . Breast cancer Neg Hx   . Stomach cancer Neg Hx   . Hypertension Sister   . Hypertension Brother   . Kidney disease Brother      Social History: History   Social History  . Marital Status: Single    Spouse Name: N/A    Number of Children: N/A  . Years of Education: N/A   Occupational  History  . Disability    Social History Main Topics  . Smoking status: Current Some Day Smoker -- 0.01 packs/day for 30 years    Types: Cigarettes  . Smokeless tobacco: Never Used     Comment: smokes 2 cigs daily/uses patches.  . Alcohol Use: No     Comment: denied alcohol at this time  . Drug Use: Yes    Special: Cocaine     Comment: THC  cocaine  . Sexual Activity: Yes     Comment: hysterectomy   Other Topics Concern  . Not on file   Social History Narrative   On disability                 Review of Systems: Pertinent items are noted in HPI.  Physical Exam: Blood pressure 133/79, pulse 71, temperature 97.7 F (36.5 C), temperature source Oral, resp. rate 14, SpO2 96.00%.  General: Alert, tired-appearing woman lying in bed in NAD. Appears stated age. HEENT: PERRLA, EOMI, conjunctiva anicteric and without injection. Nares wnl and without discharge or erythema. Trachea midline, neck supple and without LAD. Thyroid difficult to assess as  patient moves neck with exam. Pulmonary: Clear to auscultation bilaterally, mild crackles at right lung base, no increased work of breathing.  No chest wall tenderness or deformity. Cardiovascular: Regular rate and rhythm, S1 and S2 normal, no murmur, rub, or gallop appreciated. All four extremities warm, well-perfused, without cyanosis or edema. 2+ radial, DP pulses bilaterally. Abdomen: Bowel sounds active. Soft, non-distended, no masses, no hepatosplenomegaly, tender to palpation in mid upper region of abdomen as well as mildly tender in suprapubic area and RLQ to deep palpation.  No rebound or guarding.  CVA tenderness difficult to assess as she has pain to palpation throughout her paraspinal musculature. MSK: Tenderness to palpation down spine and paraspinal muscles throughout. Skin: Skin color, texture, turgor normal, no rashes or lesions appreciated. Neurologic: Symmetric smile, strong eye close, midline tongue protrusion, grossly  normal hearing.  Differential experience of light touch between left and right face.  Not able to feel as well on the right side of his face.  Strength 4/5 throughout, 4-/5 on right side.  Unable to assess gait given pain and current "wooziness."  Lab results: GLUCOSE, CAPILLARY     Status: Abnormal   Collection Time    06/19/13 12:12 PM      Result Value Ref Range   Glucose-Capillary 359 (*) 70 - 99 mg/dL  GLUCOSE, CAPILLARY     Status: Abnormal   Collection Time    06/22/13  1:35 PM      Result Value Ref Range   Glucose-Capillary 351 (*) 70 - 99 mg/dL  CBG MONITORING, ED     Status: Abnormal   Collection Time    07/20/13  9:59 AM      Result Value Ref Range   Glucose-Capillary 599 (*) 70 - 99 mg/dL   Comment 1 Notify RN    CBC WITH DIFFERENTIAL     Status: Abnormal   Collection Time    07/20/13 10:09 AM      Result Value Ref Range   WBC 4.9  4.0 - 10.5 K/uL   RBC 4.77  3.87 - 5.11 MIL/uL   Hemoglobin 13.9  12.0 - 15.0 g/dL   HCT 41.2  36.0 - 46.0 %   MCV 86.4  78.0 - 100.0 fL   MCH 29.1  26.0 - 34.0 pg   MCHC 33.7  30.0 - 36.0 g/dL   RDW 16.0 (*) 11.5 - 15.5 %   Platelets 304  150 - 400 K/uL   Neutrophils Relative % 63  43 - 77 %   Neutro Abs 3.1  1.7 - 7.7 K/uL   Lymphocytes Relative 30  12 - 46 %   Lymphs Abs 1.5  0.7 - 4.0 K/uL   Monocytes Relative 6  3 - 12 %   Monocytes Absolute 0.3  0.1 - 1.0 K/uL   Eosinophils Relative 1  0 - 5 %   Eosinophils Absolute 0.1  0.0 - 0.7 K/uL   Basophils Relative 0  0 - 1 %   Basophils Absolute 0.0  0.0 - 0.1 K/uL  COMPREHENSIVE METABOLIC PANEL     Status: Abnormal   Collection Time    07/20/13 10:09 AM      Result Value Ref Range   Sodium 129 (*) 137 - 147 mEq/L   Potassium 4.0  3.7 - 5.3 mEq/L   Chloride 90 (*) 96 - 112 mEq/L   CO2 21  19 - 32 mEq/L   Glucose, Bld 678 (*) 70 - 99 mg/dL   Comment:  CRITICAL RESULT CALLED TO, READ BACK BY AND VERIFIED WITH:     HASZ,F AT 1059 ON [id number] BY POTEAT,S   BUN 12  6 - 23 mg/dL    Creatinine, Ser 1.14 (*) 0.50 - 1.10 mg/dL   Calcium 9.2  8.4 - 10.5 mg/dL   Total Protein 7.1  6.0 - 8.3 g/dL   Albumin 3.4 (*) 3.5 - 5.2 g/dL   AST 19  0 - 37 U/L   ALT 15  0 - 35 U/L   Alkaline Phosphatase 159 (*) 39 - 117 U/L   Total Bilirubin 0.3  0.3 - 1.2 mg/dL   GFR calc non Af Amer 53 (*) >90 mL/min   GFR calc Af Amer 62 (*) >90 mL/min   Comment: (NOTE)     The eGFR has been calculated using the CKD EPI equation.     This calculation has not been validated in all clinical situations.     eGFR's persistently <90 mL/min signify possible Chronic Kidney     Disease.  URINALYSIS, ROUTINE W REFLEX MICROSCOPIC     Status: Abnormal   Collection Time    07/20/13 10:24 AM      Result Value Ref Range   Color, Urine YELLOW  YELLOW   APPearance CLEAR  CLEAR   Specific Gravity, Urine 1.035 (*) 1.005 - 1.030   pH 5.5  5.0 - 8.0   Glucose, UA >1000 (*) NEGATIVE mg/dL   Hgb urine dipstick TRACE (*) NEGATIVE   Bilirubin Urine NEGATIVE  NEGATIVE   Ketones, ur NEGATIVE  NEGATIVE mg/dL   Protein, ur NEGATIVE  NEGATIVE mg/dL   Urobilinogen, UA 0.2  0.0 - 1.0 mg/dL   Nitrite NEGATIVE  NEGATIVE   Leukocytes, UA MODERATE (*) NEGATIVE  I-STAT TROPOININ, ED     Status: None   Collection Time    07/20/13 10:24 AM      Result Value Ref Range   Troponin i, poc 0.00  0.00 - 0.08 ng/mL   Comment 3            Comment: Due to the release kinetics of cTnI,     a negative result within the first hours     of the onset of symptoms does not rule out     myocardial infarction with certainty.     If myocardial infarction is still suspected,     repeat the test at appropriate intervals.  URINE MICROSCOPIC-ADD ON     Status: None   Collection Time    07/20/13 10:24 AM      Result Value Ref Range   Squamous Epithelial / LPF RARE  RARE   WBC, UA 7-10  <3 WBC/hpf   RBC / HPF 0-2  <3 RBC/hpf   Urine-Other FEW YEAST    CBG MONITORING, ED     Status: Abnormal   Collection Time    07/20/13 11:10 AM       Result Value Ref Range   Glucose-Capillary 470 (*) 70 - 99 mg/dL    Imaging results:  Dg Chest 2 View 07/20/2013    IMPRESSION: No acute disease.   Electronically Signed   By: Inge Rise M.D.   On: 07/20/2013 11:16   Dg Cervical Spine Complete 07/20/2013  IMPRESSION: Negative exam.   Electronically Signed   By: Inge Rise M.D.     Dg Lumbar Spine Complete 07/20/2013   IMPRESSION: No evidence of acute osseous abnormalities.   Electronically Signed   By: Cori Razor  Burt Knack M.D.   On: 07/20/2013 11:17   Ct Head Wo Contrast 07/20/2013   IMPRESSION: 1. There is no evidence of acute ischemic or hemorrhagic infarction. 2. There is no intracranial mass effect or hydrocephalus. 3. 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   Electronically Signed   By: David  Martinique   On: 07/20/2013 10:35   Other results: EKG: Normal sinus rhythm. T wave flattening in V1 - V6 unchanged from prior. QTc 619.  Assessment & Plan by Problem: Active Problems:   DKA (diabetic ketoacidosis)  [name] is a 58 year old woman with a PMH of multiple comorbidities, notably T2DM on insulin and significant Psychiatric illness (Bipolar Disorder, Paranoid Schizophrenia, Depression) who [facility] the [facility] in DKA, with UTI as possible precipitant, after a fall.  Diabetic Ketoacidosis: Presented to [facility] with AG 18 and CBG of 678.  Appears to run with CBGs in the 200s - 400s.  States she is taking home Lantus 20U daily.  Most recent HgbA1c 13.4% (11/14).  No ketones in the urine.  Likely precipitated by UTI, however, patient is asymptomatic (see discussion below).  Need to rule out ACS as cause given her atypical chest pain.  Given that she has no ketones in her urine, we will investigate other causes of AG acidosis, as well.  Will check Lipase given her abdominal pain, although, the pain does seem chronic in nature. - BMET Q2hours until gap closes and  electrolytes normalize - Continue insulin gtt until gap closes --> transition to long-acting insulin - Cardiac Telemetry - F/U Troponins - Continuous Pulse Oximetry - D5 1/2NS at 125 cc/hr as CBG is now <956  - F/U Salicylate, Ethanol, Lactic Acid levels and Plasma Osmolality - F/U Lipase  Asymptomatic Bacteruria: Moderate WBCs and few yeast on UA with mild suprapubic pain but no other symptoms.  S/P one dose IV Ceftriaxone at [facility]. - Hold antibiotics for now as she is largely asymptommatic; will follow up in the morning - F/U Urine Culture  S/P Fall with Weakness: CT Head + spine studies negative.  No focal neurologic deficits evident in her lower extremities.  History of fall sounds orthostatic in nature, however, opiate use for chronic pain may be contributing.  The differential for her weakness includes deconditioning and muscle wasting from long-history of uncontrolled hyperglycemia, as well as weakness from opiate use for chronic pain.  She does not seem to have symptoms of a radiculopathy on exam, but we will reassess her gait for foot drop/other abnormalities when she is feeling up to it.  We will also rule out hypothyroidism as the cause.  As an outpatient, if weakness continues, could consider work up for [employer]. - F/U orthostatics once she is able to stand - F/U TSH, free T4 - assess gait once she is feeling better - PT/OT eval  Asthma and COPD:  No wheezing on exam, however, states she is short of breath.  Satting well on RA earlier in the day.  Now satting well on [facility] as it makes her feel better-- no desats recorded that may have prompted 2LNC use. - Continue home Dulera  - Continue home Albuterol prn  Hypertension: Stable. - Continue home Amlodipine 75m  Hypothyroidism: Last TSH 12.047 in 03/2013. - F/U TSH, free T4 - Continue home Levothyroxine 304m pending TSH results  Hyperlipidemia: - Hold home Atorvastatin 804miven acute illness  CKD, Stage II: Stable, Cr at  her baseline -  Avoid nephrotoxic agents  Peripheral Neuropathy: - Hold home Gabapentin given acute illness  Gout: Stable. - Hold home Allopurinol given acute illness  Paranoid Schizophrenia:  - Hold home Haldol as QTc prolonged  Depression:  - Hold home Sertraline 164m and Trazodone given acute illness  Tobacco Abuse: - Nicotine Patch - Counseling prior to discharge  DVT PPx: Heparin 5000 U subcutaneous q8h  Dispo: Disposition is deferred at this time; awaiting improvement of current medical problems.   The patient does have a current PCP (Mechele Claude RApolonio Schneiders MD) and [facility].   The patient does not have transportation limitations that hinder transportation to clinic appointments.  No Follow-up on file.   This is a MCareers information officerNote.  The care of the patient was discussed with Dr. GEulas Postand the assessment and plan was formulated with their assistance.  Please see their note for official documentation of the patient encounter.   Signed: SMorley Kos MS4 SMorley Kos Med Student 07/20/2013, 12:16 PM

## 2013-07-20 NOTE — ED Notes (Signed)
Patient transported to CT 

## 2013-07-21 DIAGNOSIS — E785 Hyperlipidemia, unspecified: Secondary | ICD-10-CM

## 2013-07-21 DIAGNOSIS — E039 Hypothyroidism, unspecified: Secondary | ICD-10-CM

## 2013-07-21 DIAGNOSIS — E872 Acidosis, unspecified: Secondary | ICD-10-CM

## 2013-07-21 DIAGNOSIS — E1110 Type 2 diabetes mellitus with ketoacidosis without coma: Secondary | ICD-10-CM | POA: Diagnosis present

## 2013-07-21 DIAGNOSIS — I129 Hypertensive chronic kidney disease with stage 1 through stage 4 chronic kidney disease, or unspecified chronic kidney disease: Secondary | ICD-10-CM

## 2013-07-21 DIAGNOSIS — R5381 Other malaise: Secondary | ICD-10-CM

## 2013-07-21 DIAGNOSIS — G609 Hereditary and idiopathic neuropathy, unspecified: Secondary | ICD-10-CM

## 2013-07-21 DIAGNOSIS — E1169 Type 2 diabetes mellitus with other specified complication: Secondary | ICD-10-CM

## 2013-07-21 DIAGNOSIS — F17200 Nicotine dependence, unspecified, uncomplicated: Secondary | ICD-10-CM

## 2013-07-21 DIAGNOSIS — N182 Chronic kidney disease, stage 2 (mild): Secondary | ICD-10-CM

## 2013-07-21 DIAGNOSIS — M109 Gout, unspecified: Secondary | ICD-10-CM

## 2013-07-21 DIAGNOSIS — N899 Noninflammatory disorder of vagina, unspecified: Secondary | ICD-10-CM

## 2013-07-21 DIAGNOSIS — R5383 Other fatigue: Secondary | ICD-10-CM

## 2013-07-21 DIAGNOSIS — J449 Chronic obstructive pulmonary disease, unspecified: Secondary | ICD-10-CM

## 2013-07-21 DIAGNOSIS — F3289 Other specified depressive episodes: Secondary | ICD-10-CM

## 2013-07-21 DIAGNOSIS — F200 Paranoid schizophrenia: Secondary | ICD-10-CM

## 2013-07-21 DIAGNOSIS — F329 Major depressive disorder, single episode, unspecified: Secondary | ICD-10-CM

## 2013-07-21 DIAGNOSIS — I10 Essential (primary) hypertension: Secondary | ICD-10-CM

## 2013-07-21 HISTORY — DX: Type 2 diabetes mellitus with ketoacidosis without coma: E11.10

## 2013-07-21 LAB — BASIC METABOLIC PANEL
BUN: 10 mg/dL (ref 6–23)
BUN: 12 mg/dL (ref 6–23)
CALCIUM: 8.1 mg/dL — AB (ref 8.4–10.5)
CHLORIDE: 103 meq/L (ref 96–112)
CO2: 24 mEq/L (ref 19–32)
CO2: 24 mEq/L (ref 19–32)
CREATININE: 1 mg/dL (ref 0.50–1.10)
Calcium: 8.3 mg/dL — ABNORMAL LOW (ref 8.4–10.5)
Chloride: 101 mEq/L (ref 96–112)
Creatinine, Ser: 0.96 mg/dL (ref 0.50–1.10)
GFR calc Af Amer: 76 mL/min — ABNORMAL LOW (ref >90)
GFR calc non Af Amer: 65 mL/min — ABNORMAL LOW (ref >90)
GFR, EST AFRICAN AMERICAN: 72 mL/min — AB (ref >90)
GFR, EST NON AFRICAN AMERICAN: 62 mL/min — AB (ref >90)
Glucose, Bld: 156 mg/dL — ABNORMAL HIGH (ref 70–99)
Glucose, Bld: 281 mg/dL — ABNORMAL HIGH (ref 70–99)
POTASSIUM: 4 meq/L (ref 3.7–5.3)
Potassium: 4.5 mEq/L (ref 3.7–5.3)
SODIUM: 139 meq/L (ref 137–147)
Sodium: 138 mEq/L (ref 137–147)

## 2013-07-21 LAB — GLUCOSE, CAPILLARY
GLUCOSE-CAPILLARY: 250 mg/dL — AB (ref 70–99)
GLUCOSE-CAPILLARY: 195 mg/dL — AB (ref 70–99)
GLUCOSE-CAPILLARY: 519 mg/dL — AB (ref 70–99)
GLUCOSE-CAPILLARY: 354 mg/dL — AB (ref 70–99)
Glucose-Capillary: 137 mg/dL — ABNORMAL HIGH (ref 70–99)
Glucose-Capillary: 151 mg/dL — ABNORMAL HIGH (ref 70–99)
Glucose-Capillary: 208 mg/dL — ABNORMAL HIGH (ref 70–99)
Glucose-Capillary: 214 mg/dL — ABNORMAL HIGH (ref 70–99)
Glucose-Capillary: 287 mg/dL — ABNORMAL HIGH (ref 70–99)
Glucose-Capillary: 466 mg/dL — ABNORMAL HIGH (ref 70–99)

## 2013-07-21 LAB — MAGNESIUM: Magnesium: 1.8 mg/dL (ref 1.5–2.5)

## 2013-07-21 LAB — TSH: TSH: 114.583 u[IU]/mL — ABNORMAL HIGH (ref 0.350–4.500)

## 2013-07-21 LAB — HEMOGLOBIN A1C
Hgb A1c MFr Bld: 15.3 % — ABNORMAL HIGH (ref <5.7)
Mean Plasma Glucose: 392 mg/dL — ABNORMAL HIGH (ref <117)

## 2013-07-21 LAB — TROPONIN I
Troponin I: <0.3 ng/mL (ref <0.30)
Troponin I: <0.3 ng/mL (ref <0.30)

## 2013-07-21 LAB — T4, FREE: FREE T4: 0.36 ng/dL — AB (ref 0.80–1.80)

## 2013-07-21 LAB — OSMOLALITY: Osmolality: 289 mOsm/kg (ref 275–300)

## 2013-07-21 MED ORDER — LEVOTHYROXINE SODIUM 25 MCG PO TABS
25.0000 ug | ORAL_TABLET | Freq: Every day | ORAL | Status: DC
  Administered 2013-07-22: 25 ug via ORAL
  Filled 2013-07-21 (×3): qty 1

## 2013-07-21 MED ORDER — ATORVASTATIN CALCIUM 80 MG PO TABS
80.0000 mg | ORAL_TABLET | Freq: Every day | ORAL | Status: DC
  Administered 2013-07-21: 80 mg via ORAL
  Filled 2013-07-21 (×2): qty 1

## 2013-07-21 MED ORDER — INSULIN ASPART 100 UNIT/ML ~~LOC~~ SOLN
0.0000 [IU] | Freq: Three times a day (TID) | SUBCUTANEOUS | Status: DC
  Administered 2013-07-21: 15 [IU] via SUBCUTANEOUS

## 2013-07-21 MED ORDER — NITROFURANTOIN MACROCRYSTAL 100 MG PO CAPS: 100.0000 mg | ORAL_CAPSULE | Freq: Two times a day (BID) | ORAL | Status: DC

## 2013-07-21 MED ORDER — INSULIN GLARGINE 100 UNIT/ML ~~LOC~~ SOLN
5.0000 [IU] | SUBCUTANEOUS | Status: AC
  Administered 2013-07-21: 5 [IU] via SUBCUTANEOUS
  Filled 2013-07-21: qty 0.05

## 2013-07-21 MED ORDER — FLUCONAZOLE 150 MG PO TABS
150.0000 mg | ORAL_TABLET | Freq: Every day | ORAL | Status: DC
  Filled 2013-07-21: qty 1

## 2013-07-21 MED ORDER — INSULIN ASPART 100 UNIT/ML ~~LOC~~ SOLN
0.0000 [IU] | Freq: Three times a day (TID) | SUBCUTANEOUS | Status: DC
  Administered 2013-07-21: 20 [IU] via SUBCUTANEOUS
  Administered 2013-07-22: 11 [IU] via SUBCUTANEOUS
  Administered 2013-07-22: 15 [IU] via SUBCUTANEOUS

## 2013-07-21 MED ORDER — SERTRALINE HCL 100 MG PO TABS
100.0000 mg | ORAL_TABLET | Freq: Every day | ORAL | Status: DC
  Administered 2013-07-21 – 2013-07-22 (×2): 100 mg via ORAL
  Filled 2013-07-21 (×2): qty 1

## 2013-07-21 MED ORDER — TRAZODONE HCL 100 MG PO TABS
100.0000 mg | ORAL_TABLET | Freq: Every day | ORAL | Status: DC
  Filled 2013-07-21: qty 1

## 2013-07-21 MED ORDER — INSULIN ASPART 100 UNIT/ML ~~LOC~~ SOLN
5.0000 [IU] | Freq: Once | SUBCUTANEOUS | Status: AC
  Administered 2013-07-21: 5 [IU] via SUBCUTANEOUS

## 2013-07-21 MED ORDER — GABAPENTIN 300 MG PO CAPS
300.0000 mg | ORAL_CAPSULE | Freq: Three times a day (TID) | ORAL | Status: DC
  Administered 2013-07-21 – 2013-07-22 (×3): 300 mg via ORAL
  Filled 2013-07-21 (×6): qty 1

## 2013-07-21 MED ORDER — PANTOPRAZOLE SODIUM 40 MG PO TBEC
40.0000 mg | DELAYED_RELEASE_TABLET | Freq: Every day | ORAL | Status: DC
  Administered 2013-07-21 – 2013-07-22 (×2): 40 mg via ORAL
  Filled 2013-07-21 (×2): qty 1

## 2013-07-21 MED ORDER — MICONAZOLE NITRATE 200 MG VA SUPP
200.0000 mg | Freq: Every day | VAGINAL | Status: DC
  Administered 2013-07-21 (×2): 200 mg via VAGINAL
  Filled 2013-07-21 (×2): qty 3

## 2013-07-21 MED ORDER — INSULIN GLARGINE 100 UNIT/ML ~~LOC~~ SOLN
20.0000 [IU] | Freq: Every day | SUBCUTANEOUS | Status: DC
  Administered 2013-07-21: 20 [IU] via SUBCUTANEOUS
  Filled 2013-07-21 (×2): qty 0.2

## 2013-07-21 MED ORDER — SULFAMETHOXAZOLE-TMP DS 800-160 MG PO TABS
1.0000 | ORAL_TABLET | Freq: Two times a day (BID) | ORAL | Status: DC
  Administered 2013-07-21: 1 via ORAL
  Filled 2013-07-21 (×2): qty 1

## 2013-07-21 MED ORDER — SODIUM CHLORIDE 0.9 % IV SOLN
INTRAVENOUS | Status: DC
  Administered 2013-07-21: 08:00:00 via INTRAVENOUS

## 2013-07-21 MED ORDER — NITROFURANTOIN MONOHYD MACRO 100 MG PO CAPS
100.0000 mg | ORAL_CAPSULE | Freq: Two times a day (BID) | ORAL | Status: DC
  Administered 2013-07-21 – 2013-07-22 (×3): 100 mg via ORAL
  Filled 2013-07-21 (×5): qty 1

## 2013-07-21 MED ORDER — INSULIN ASPART 100 UNIT/ML ~~LOC~~ SOLN
4.0000 [IU] | Freq: Three times a day (TID) | SUBCUTANEOUS | Status: DC
  Administered 2013-07-21 – 2013-07-22 (×2): 4 [IU] via SUBCUTANEOUS

## 2013-07-21 NOTE — Evaluation (Addendum)
Physical Therapy Evaluation Patient Details Name: [name] MRN: [id number] DOB: Jan 05, 1956 Today's Date: 07/21/2013 Time: 2585-2778 PT Time Calculation (min): 18 min  PT Assessment / Plan / Recommendation History of Present Illness  Pt admit with DKA, weakness and fall.  Clinical Impression  Pt admitted with above. Pt currently with functional limitations due to the deficits listed below (see PT Problem List). Should progres and be able to go home with HHPT f/u.  Husband will be there and pt has aide.  Instructed pt to use RW at all times on d/c.   Pt will benefit from skilled PT to increase their independence and safety with mobility to allow discharge to the venue listed below.     PT Assessment  Patient needs continued PT services    Follow Up Recommendations  Home health PT;Supervision/Assistance - 24 hour    Does the patient have the potential to tolerate intense rehabilitation      Barriers to Discharge        Equipment Recommendations  3in1 (PT)    Recommendations for Other Services     Frequency Min 3X/week    Precautions / Restrictions Precautions Precautions: Fall Precaution Comments: Pt reports several falls at home when not using RW.   Restrictions Weight Bearing Restrictions: No   Pertinent Vitals/Pain VSS with HR 69-71 bpm, O2 88-92% on RA with activity, BP 108/95 pre activity and 112/72 post activity.  Pain bil LEs and back per pt and pt called for pain meds after treatment.        Mobility  Bed Mobility Overal bed mobility: Independent Transfers Overall transfer level: Needs assistance Equipment used: Rolling walker (2 wheeled) Transfers: Sit to/from Stand Sit to Stand: Min guard General transfer comment: cues for hand placement.   Ambulation/Gait Ambulation/Gait assistance: Min assist Ambulation Distance (Feet): 65 Feet Assistive device: Rolling walker (2 wheeled) Gait Pattern/deviations: Step-to pattern;Decreased stride length;Wide base of  support Gait velocity interpretation: <1.8 ft/sec, indicative of risk for recurrent falls General Gait Details: Pt began ambulating steady with RW.  C/o dizziness once in hallway.  Pt turned around and made it back to the room.  Pt states her back and LEs hurt but did not seem to have any difficulty getting back to room.      Exercises     PT Diagnosis: Generalized weakness;Acute pain  PT Problem List: Decreased activity tolerance;Decreased balance;Decreased mobility;Decreased knowledge of use of DME;Decreased safety awareness;Decreased knowledge of precautions;Pain PT Treatment Interventions: DME instruction;Gait training;Functional mobility training;Therapeutic activities;Therapeutic exercise;Balance training;Patient/family education     PT Goals(Current goals can be found in the care plan section) Acute Rehab PT Goals Patient Stated Goal: to go home PT Goal Formulation: With patient Time For Goal Achievement: 07/28/13 Potential to Achieve Goals: Good  Visit Information  Last PT Received On: 07/21/13 Assistance Needed: +1 History of Present Illness: Pt admit with DKA, weakness and fall.       Prior [place] expects to be discharged to:: Private residence Living Arrangements: Spouse/significant other Available Help at Discharge: Family;Available 24 hours/day;Personal care attendant (Aide for pt and husband daily.  ) Type of Home: House Home Access: Stairs to enter [employer] of Steps: 7 Entrance Stairs-Rails: Left Home Layout: One level Home Equipment: [employer] - single point;Walker - 2 wheels Prior Function Level of Independence: Independent with assistive device(s) Communication Communication: No difficulties Dominant Hand: Right    Cognition  Cognition Arousal/Alertness: Awake/alert Behavior During Therapy: WFL for tasks assessed/performed Overall Cognitive  Status: Within Functional Limits for tasks assessed    Extremity/Trunk  Assessment Upper Extremity Assessment Upper Extremity Assessment: Defer to OT evaluation Lower Extremity Assessment Lower Extremity Assessment: Generalized weakness Cervical / Trunk Assessment Cervical / Trunk Assessment: Normal   Balance Balance Overall balance assessment: Needs assistance;History of Falls Standing balance support: Bilateral upper extremity supported;During functional activity Standing balance-Leahy Scale: Fair  End of Session PT - End of Session Equipment Utilized During Treatment: Gait belt Activity Tolerance: Patient limited by fatigue Patient left: in chair;with call bell/phone within reach;with family/visitor present Nurse Communication: Mobility status  GP Functional Assessment Tool Used: clinical judgement Functional Limitation: Mobility: Walking and moving around Mobility: Walking and Moving Around Current Status (U0454): At least 20 percent but less than 40 percent impaired, limited or restricted Mobility: Walking and Moving Around Goal Status [phone]): At least 1 percent but less than 20 percent impaired, limited or restricted   Mathis,Maria [place] 07/21/2013, 10:11 AM [facility] Acute Rehabilitation [phone] (pager)

## 2013-07-21 NOTE — Progress Notes (Signed)
OT Cancellation Note  Patient Details Name: [name] MRN: [id number] DOB: 01/15/1956   Cancelled Treatment:    Reason Eval/Treat Not Completed: Patient not medically ready (bedrest until 17:51 PM 07/21/13) OT to hold evaluation at this time.   Peri Maris Pager: [phone]  07/21/2013, 8:01 AM

## 2013-07-21 NOTE — Progress Notes (Signed)
Subjective: Overnight, [name]'s gap closed and she was transitioned from insulin gtt to Lantus 10U subQ + SS.  She now is tolerating a normal diet.  She continues to describe a vaginal itchiness and burning in her labia when she wipes.  She is feeling better this morning with improved dizziness, but remains a little dizzy even sitting in the bed.  She characterizes her dizziness as "woozy," and does not feel the room is spinning.  She denies nausea, tinnitus, or ear fullness.  The dizziness is not exacerbated by walking.  She was able to participate with PT, however, could not walk for long as she stated she was dizzy and had to return to the room.  She immediately was in need of pain medication once she returned to the room.  On further questioning, she states she has not been taking her Levothyroxine, Lisinopril, Atorvastatin, or Allopurinol for some time as she has not had the funds to buy them and because she doesn't think her PCP gave her the prescription for the Levothyroxine the last time she saw her (would have been 05/2013).  Later this morning, she had a CBG of 466.  Nursing believes she may have consumed juice or soda in the room.  Objective: Vital signs in last 24 hours: Filed Vitals:   07/21/13 0500 07/21/13 0800 07/21/13 0900 07/21/13 0943  BP:  108/95  112/72  Pulse:  65  36 --> ECG pulse 80  Temp:  97.8 F (36.6 C)    TempSrc:  Oral    Resp:  17  22  Height: 5\' 2"  (1.575 m)     Weight: 86.6 kg (190 lb 14.7 oz)     SpO2:  99% 94% 91%   Weight change:   Intake/Output Summary (Last 24 hours) at 07/21/13 1032 Last data filed at 07/21/13 0500  Gross per 24 hour  Intake 1510.42 ml  Output      1 ml  Net 1509.42 ml    Physical Exam General: in NAD, lying in bed, more mobile with her back than she was the day prior Cardiovascular: regular rate and normal rhythm, no murmurs/rubs/gallops, 2+ radial pulses bilaterally, lower extremities warm and well perfused without  edema Lung: normal air movement in upper lung fields, mild crackles in lower lung fields bilaterally, no increased work of breathing Abdomen: soft, non-distended, some mild suprapubic tenderness Skin: Dry throughout, minimal flaking Neuro: grossly intact; finger to nose and heel to shin intact; proprioception in toes b/l intact  Lab Results:  Recent Labs Lab 07/20/13 2115 07/20/13 2333 07/21/13 0545  NA 139 138 139  K 4.8 4.5 4.0  CL 103 101 103  CO2 22 24 24   GLUCOSE 174* 281* 156*  BUN 12 12 10   CREATININE 0.97 1.00 0.96  CALCIUM 8.2* 8.1* 8.3*  GFRNONAA 65* 62* 65*  GFRAA 75* 72* 76*   TSH 114.583 Lactic Acid 1.1 Salicylate <2.2 Ethanol Negative Troponins Negative x3 HgbA1c 15.3%  Medications: I have reviewed the patient's current medications. Scheduled Meds: . amLODipine  5 mg Oral Daily  . aspirin EC  81 mg Oral Daily  . atorvastatin  80 mg Oral q1800  . brimonidine  1 drop Both Eyes BID  . fluconazole  150 mg Oral Daily  . gabapentin  300 mg Oral TID  . heparin  5,000 Units Subcutaneous 3 times per day  . insulin aspart  0-15 Units Subcutaneous TID WC  . insulin glargine  10 Units Subcutaneous QHS  .  latanoprost  1 drop Both Eyes QHS  . levothyroxine  300 mcg Oral QAC breakfast  . mometasone-formoterol  2 puff Inhalation BID  . nicotine  14 mg Transdermal Daily  . pantoprazole  40 mg Oral Daily  . sertraline  100 mg Oral Daily  . sulfamethoxazole-trimethoprim  1 tablet Oral Q12H  . traZODone  100 mg Oral QHS   Continuous Infusions: . sodium chloride 100 mL/hr at 07/21/13 0807  . dextrose 5 % and 0.45% NaCl     PRN Meds:.albuterol, dextrose, ipratropium-albuterol, nitroGLYCERIN, oxyCODONE   Assessment/Plan: Principal Problem:   DKA (diabetic ketoacidosis) Active Problems:   Hypothyroidism   DM (diabetes mellitus)   Chronic back pain   HTN (hypertension)   Peripheral neuropathy   COPD (chronic obstructive pulmonary disease)   Hyperglycemia    Cocaine abuse  [name] is a 58 year old woman with a PMH of multiple comorbidities, notably hypothyriodism and T2DM on insulin as well as significant Psychiatric illness (Bipolar Disorder, Paranoid Schizophrenia, Depression) who presented initially to [facility] for a fall, found to have hyperglycemia with AG metabolic acidosis now resolved, precipitated by likely UTI, now with uncontrolled Hypothyroidism.  Hyperglycemia with AG Metabolic Acidosis: AG Metabolic Acidosis resolved.  CBGs were doing well until this morning when she had one at 519 and one at 466, after pt possibly consumed a sugary beverage.  Very uncontrolled T2DM at baseline with HgbA1c 15.3%, previously 13.4% (11/14). No ketones in the urine on presentation. Hyperglycemia may have been precipitated by UTI, however, patient has remained asymptomatic (see discussion below) and tends to run with CBGs in the 390s as indicated by her A1c. ACS ruled out. Other causes of AG acidosis ruled out.  Acute Pancreatitis ruled out. - Titrate Lantus QHS to 20U QHS - switch to SSI resistant - Transfer to Med-Surg out of SDU - Continue Cardiac Telemetry given her hypothyroidism and dose of Levothyroxine which she was not taking - Continuous Pulse Oximetry  - d/c fluids - will need SW follow-up to help with access to medications prior to d/c  Asymptomatic Bacteruria: Moderate WBCs and few yeast on UA with mild suprapubic pain but no other symptoms. S/P one dose IV Ceftriaxone at [facility].  - Will treat with 3 days of Bactrim given that her UTI may have precipitated her hyperglycemia - F/U Urine Culture   Vaginal Itching: Likely 2/2 to yeast infection.  Yeast on UA on admission. She is at high risk for yeast given uncontrolled Diabetes. - Metronidazole vaginal cream QHS (Diflucan contraindicated given prolonged QTc)  S/P Fall with Weakness: CT Head + spine studies negative. No focal neurologic deficits evident in her lower extremities - diffuse  weakness, likely multifactorial in origin with uncontrolled hypothyroidism, uncontrolled diabetes, peripheral neuropathy, and chronic pain contributing.  Unclear etiology, however, subjectively improved per patient this am.  Differential includes imbalance caused by peripheral neuropathy and poor vision.  Not likely vertigo given history.  Orthostatics negative.  - F/U Free T4 - Continue to monitor - Continue home Levothyroxine 358mcg  Hypothyroidism: TSH 114.583.  She has not been taking her Levothyroxine as she has not had access to it. However, we will continue home Levothyroxine 313mcg pending TSH results.  We will consider lowering tomorrow's dose as she has not been taking the medication and we do not want to precipitate A. Fib or heart failure via a drastic increase in thyroid hormone.  - F/U free T4  - SW to help with access to meds  Asthma and  COPD: No wheezing on exam.  Dropping to high 80s with ambulation on room air. - Continue home Dulera  - Continue home Albuterol prn   Hypertension: Stable.  - Continue home Amlodipine 5mg    Hyperlipidemia: Stable. - Continue home Atorvastatin 80mg  daily  CKD, Stage II: Stable, Cr at her baseline  - Avoid nephrotoxic agents   Peripheral Neuropathy: Stable.  Possibly contributing to her imbalance and falls. - Continue home Gabapentin 300mg  TID  Gout: Stable.  Was not taking Allopurinol at home. - Hold home Allopurinol given she has not been taking it at home and do not want to precipitate an acute flare.   Paranoid Schizophrenia: Stable. - Hold home Haldol as QTc prolonged, likely due to both Haldol and Hypothyroidism  Depression: Stable. - Continue home Sertraline 100mg  and Trazodone   Tobacco Abuse:  - Nicotine Patch  - Counseling prior to discharge   DVT PPx: Heparin 5000 U subcutaneous q8h  This is a Careers information officer Note.  The care of the patient was discussed with Dr. Burnard Bunting and the assessment and plan formulated with their  assistance.  Please see their attached note for official documentation of the daily encounter.   LOS: 1 day   Morley Kos, Med Student 07/21/2013, 10:32 AM

## 2013-07-21 NOTE — Progress Notes (Signed)
UR completed. Patient changed to inpatient- requiring IVF @ 100cc/hr and initially required IV insulin gtt.

## 2013-07-21 NOTE — Progress Notes (Signed)
Inpatient Diabetes Program Recommendations  AACE/ADA: New Consensus Statement on Inpatient Glycemic Control (2013)  Target Ranges:  Prepandial:   less than 140 mg/dL      Peak postprandial:   less than 180 mg/dL (1-2 hours)      Critically ill patients:  140 - 180 mg/dL   Reason for Visit: DKA, A1C of 15.3  Diabetes history: Type 2  Outpatient Diabetes medications: Lantus 20 units at bedtme, Novolog 4 units tid with meals (not on Med Rec) Current orders for Inpatient glycemic control: Lantus 10 units at HS, Moderate Novolog Correction tid  Results for VLASTA, BASKIN (MRN [id number]) as of 07/21/2013 13:00  Ref. Range 07/21/2013 06:49 07/21/2013 08:19 07/21/2013 12:19 07/21/2013 12:38  Glucose-Capillary Latest Range: 70-99 mg/dL 137 (H) 151 (H) 519 (H) 466 (H)   Note:  A1C [place] 15.3.  Per MD note patient states she always takes her insulin.  Told me that she misses it sometimes.  When asked what gets in the way of her taking it, she replied "I start doing something else". Tried to emphasize need to take insulin-- too dangerous for her to miss any doses.  States that when she "eats right" her sugars run in the 200's.  Otherwise, higher than that -- in the 300's.  Patient states she needs a new meter because she "lost her's".    Gave her information regarding a meter from [facility] in case Medicare/Medicaid will not supply one for her (if one has been supplied recently).  Per [facility], patient prone to falling.  Would placement be appropriate for this patient given physical issues plus psychiatric issues contributing to patient's poor self-care of diabetes?  Recommendations: Request MD order for Novolog 4 units tid with meals as meal coverage in addition to correction (given as long as patient eats at least 50% and CBG is at least 80 mg/dl (Note spike in CBG after breakfast)  Consider increasing correction to Resistant Scale.  Consider placement for patient at discharge.  If discharged back home, needs  Rx for a glucose meter and consider Home Health   Thank you.  Devonna Oboyle S. Marcelline Mates, RN, CNS, CDE Inpatient Diabetes Program, team pager [phone]

## 2013-07-21 NOTE — H&P (Signed)
Internal Medicine Attending Admission Note Date: 07/21/2013  Patient name: [name] Medical record number: [id number] Date of birth: Aug 15, 1955 Age: 58 y.o. Gender: female  I saw and evaluated the patient. I reviewed the resident's note and I agree with the resident's findings and plan as documented in the resident's note.  [name] is a 58 year old woman with a history of type 2 diabetes, hypothyroidism, hypertension, hyperlipidemia, schizophrenia, depression, anxiety, and cocaine abuse who presents to the emergency department after a fall where she was found to have a glucose in excess of 600 with an anion gap of 18 and a urinalysis consistent with a urinary tract infection. She was admitted to the internal medicine teaching service for further evaluation and care. Her fall was described as a weakness rather than syncope. Radiographic studies of her head, neck and spine were all without fractures or other acute abnormalities. She states she's been compliant with her insulin although her hemoglobin A1c is in excess of 15%. She does admit to not taking her Synthroid, statin, and blood pressure medications as she cannot afford them.  Filed Vitals:   07/21/13 0900 07/21/13 0943 07/21/13 1125 07/21/13 1215  BP:  112/72  127/71  Pulse:    82  Temp:    98.7 F (37.1 C)  TempSrc:    Oral  Resp:  22  24  Height:      Weight:      SpO2: 94% 91% 95% 94%   She's well-developed and well-nourished lying comfortably in bed in no acute distress when seen on rounds this morning. Her lung exam revealed bibasilar inspiratory crackles posteriorly. Heart was regularly regular without murmurs, rubs or gallops. Her abdomen was soft, nontender, without rebound.  Lab results:  Basic Metabolic Panel:  Recent Labs  07/20/13 2333 07/21/13 0545  NA 138 139  K 4.5 4.0  CL 101 103  CO2 24 24  GLUCOSE 281* 156*  BUN 12 10  CREATININE 1.00 0.96  CALCIUM 8.1* 8.3*   Hemoglobin A1C:  Recent Labs  07/20/13 2011  HGBA1C 15.3*   Thyroid Function Tests:  Recent Labs  07/20/13 2011  TSH 114.583*   [name] is a 58 year old woman who presents to the emergency department after a mechanical fall and was found to be in mild diabetic ketoacidosis. Although she claims to be compliant with her insulin therapy I am concerned she may not be consistently compliant, especially in light of her noncompliance with some of her other medications. I suspect a urinary tract infection may have worsened her already poor glycemic control and led to the development of mild DKA. Her TSH of over 100 also may be playing a role in her poor glycemic control given the systemic effects of hypothyroidism.  For her diabetic ketoacidosis she was initially placed on an insulin drip and given IV hydration. Her gap quickly closed. She's been transitioned to long-acting insulin and as needed prandial insulin. Her diet was advanced. Adjustments in her insulin dosing will be made while she is an inpatient. She was also restarted on her Synthroid and this dose will be adjusted as an outpatient. We are treating her UTI with Bactrim DS.  She is stable for transfer to the general medical ward to make these last adjustments prior to discharge home which I anticipate will be tomorrow.

## 2013-07-21 NOTE — Progress Notes (Signed)
  I have seen and examined the patient, and reviewed the daily progress note by Morley Kos, MS IV and discussed the care of the patient with them. Please see my progress note from 07/21/2013 for further details regarding assessment and plan.    Signed:  Othella Boyer, MD 07/21/2013, 1:27 PM

## 2013-07-21 NOTE — Progress Notes (Signed)
I have seen the patient and reviewed the daily progress note by Morley Kos MS IV and discussed the care of the patient with them.  See below for documentation of my findings, assessment, and plans.  Subjective: Improved this morning, with some dizziness + nausea persisting   Objective: Vital signs in last 24 hours: Filed Vitals:   07/21/13 0900 07/21/13 0943 07/21/13 1125 07/21/13 1215  BP:  112/72  127/71  Pulse:    82  Temp:    98.7 F (37.1 C)  TempSrc:    Oral  Resp:  22  24  Height:      Weight:      SpO2: 94% 91% 95% 94%   Weight change:   Intake/Output Summary (Last 24 hours) at 07/21/13 1253 Last data filed at 07/21/13 0500  Gross per 24 hour  Intake 1510.42 ml  Output      1 ml  Net 1509.42 ml   General: resting in bed, no acute distress HEENT: PERRL, EOMI, no scleral icterus Cardiac: RRR, no rubs, murmurs or gallops Pulm: soft bibasilar crackles, no resp distress Abd: soft, mild suprapubic tenderness, nondistended, BS present Ext: warm and well perfused, no pedal edema Neuro: alert and oriented X3, cranial nerves II-XII grossly intact  Lab Results: Reviewed and documented in Electronic Record Micro Results: Reviewed and documented in Electronic Record Studies/Results: Reviewed and documented in Electronic Record  Medications: I have reviewed the patient's current medications. Scheduled Meds: . amLODipine  5 mg Oral Daily  . aspirin EC  81 mg Oral Daily  . atorvastatin  80 mg Oral q1800  . brimonidine  1 drop Both Eyes BID  . fluconazole  150 mg Oral Daily  . gabapentin  300 mg Oral TID  . heparin  5,000 Units Subcutaneous 3 times per day  . insulin aspart  0-15 Units Subcutaneous TID WC  . insulin glargine  10 Units Subcutaneous QHS  . latanoprost  1 drop Both Eyes QHS  . levothyroxine  300 mcg Oral QAC breakfast  . mometasone-formoterol  2 puff Inhalation BID  . nicotine  14 mg Transdermal Daily  . pantoprazole  40 mg Oral Daily  .  sertraline  100 mg Oral Daily  . sulfamethoxazole-trimethoprim  1 tablet Oral Q12H  . traZODone  100 mg Oral QHS   Continuous Infusions: . sodium chloride 100 mL/hr at 07/21/13 0807  . dextrose 5 % and 0.45% NaCl     PRN Meds:.albuterol, dextrose, ipratropium-albuterol, nitroGLYCERIN, oxyCODONE Assessment/Plan: 58yo F w/ PMH HTN,HLD, cocaine abuse, COPD, uncontrolled DM type 2, hypothyroidism, schizophrenia, depression and anxiety presents s/p fall 2/2 weakness and was found to be hyperglycemia with an anion gap concerning for DKA.  #Hyperglycemia with AG metabolic acidosis: gap closed x 2, patient has been transitioned off of insulin gtt; A1c 15.3 -Titrate up lantus -Increase SSI to resistent  -Transfer to tele -cont NS hydration  #Asymptomatic Baceruria: s/p 1 dose ceftriaxone -Bactrim x 3d -follow cx  #Weakness: related to orthostatics? Hypothyroidism? -f/u free T4, cont thyroxine (noncompliance at home) -PT/OT  #Severe Hypothyroidism: patient reports med noncompliance, will change dosing to 63mcg qAM starting tomorrow, per weight her daily dose should be ~135mcg  #Vaginal itching: with evidence of yeast on UA in setting on controlled DM -No fluconazole given prolonged QTc -- will treat with topical miconazole (less systemic effect)  --> remainder per MSIV note Morley Kos).  Dispo: Disposition is deferred at this time, awaiting improvement of current medical problems.  Anticipated  discharge in approximately 1-2d day(s).  Try to transfer out of SDU today.   The patient does have a current PCP Rebecca Eaton, [name]) and does need an [facility] hospital follow-up appointment after discharge.  The patient does not have transportation limitations that hinder transportation to clinic appointments.  .Services Needed at time of discharge: Y = Yes, Blank = No PT:   OT:   RN:   Equipment:   Other:     LOS: 1 day   Maria Boyer, [name] 07/21/2013, 12:53 PM

## 2013-07-22 DIAGNOSIS — G8929 Other chronic pain: Secondary | ICD-10-CM

## 2013-07-22 DIAGNOSIS — N390 Urinary tract infection, site not specified: Secondary | ICD-10-CM | POA: Diagnosis present

## 2013-07-22 DIAGNOSIS — F1410 Cocaine abuse, uncomplicated: Secondary | ICD-10-CM

## 2013-07-22 DIAGNOSIS — E1310 Other specified diabetes mellitus with ketoacidosis without coma: Principal | ICD-10-CM

## 2013-07-22 DIAGNOSIS — M549 Dorsalgia, unspecified: Secondary | ICD-10-CM

## 2013-07-22 LAB — GLUCOSE, CAPILLARY
Glucose-Capillary: 262 mg/dL — ABNORMAL HIGH (ref 70–99)
Glucose-Capillary: 323 mg/dL — ABNORMAL HIGH (ref 70–99)

## 2013-07-22 MED ORDER — ATORVASTATIN CALCIUM 80 MG PO TABS: 80.0000 mg | ORAL_TABLET | ORAL | Status: DC

## 2013-07-22 MED ORDER — LEVOTHYROXINE SODIUM 25 MCG PO TABS: 25.0000 ug | ORAL_TABLET | Freq: Every day | ORAL | Status: DC

## 2013-07-22 MED ORDER — FREESTYLE SYSTEM KIT: 1.0000 | PACK | Status: DC | PRN

## 2013-07-22 MED ORDER — ALBUTEROL SULFATE (2.5 MG/3ML) 0.083% IN NEBU: 2.5000 mg | INHALATION_SOLUTION | Freq: Four times a day (QID) | RESPIRATORY_TRACT | Status: DC | PRN

## 2013-07-22 MED ORDER — NITROFURANTOIN MONOHYD MACRO 100 MG PO CAPS: 100.0000 mg | ORAL_CAPSULE | Freq: Two times a day (BID) | ORAL | Status: DC

## 2013-07-22 MED ORDER — INSULIN GLARGINE 100 UNIT/ML ~~LOC~~ SOLN: 30.0000 [IU] | Freq: Every day | SUBCUTANEOUS | Status: DC

## 2013-07-22 MED ORDER — ALLOPURINOL 300 MG PO TABS: 300.0000 mg | ORAL_TABLET | Freq: Every day | ORAL | Status: DC

## 2013-07-22 MED ORDER — AMLODIPINE BESYLATE 5 MG PO TABS: 5.0000 mg | ORAL_TABLET | Freq: Every day | ORAL | Status: DC

## 2013-07-22 MED ORDER — MICONAZOLE NITRATE 200 MG VA SUPP: 200.0000 mg | Freq: Every day | VAGINAL | Status: DC

## 2013-07-22 MED ORDER — OXYCODONE HCL 10 MG PO TABS: 10.0000 mg | ORAL_TABLET | ORAL | Status: DC | PRN

## 2013-07-22 MED ORDER — INSULIN GLARGINE 100 UNIT/ML ~~LOC~~ SOLN
30.0000 [IU] | Freq: Every day | SUBCUTANEOUS | Status: DC
  Filled 2013-07-22: qty 0.3

## 2013-07-22 NOTE — Progress Notes (Signed)
Subjective: Pt doing well this morning. She states that her dizziness is greatly improved and she is feeling much better today. She states that she would like to go home today and will need refills of her home meds, including her pain medicine.   Objective: Vital signs in last 24 hours: Filed Vitals:   07/21/13 2018 07/22/13 0620 07/22/13 0745 07/22/13 0811  BP: 136/66 132/80 133/81   Pulse: 78 59 60   Temp: 97.9 F (36.6 C) 97.9 F (36.6 C) 97.5 F (36.4 C)   TempSrc: Oral Oral Oral   Resp: 18 20 18    Height: 5\' 2"  (1.575 m)     Weight: 186 lb 12.8 oz (84.732 kg)     SpO2: 95% 96% 94% 95%   Weight change: -4 lb 1.9 oz (-1.868 kg) No intake or output data in the 24 hours ending 07/22/13 1108 Vitals reviewed. General: Lying in bed eating breakfast from the cafeteria, NAD HEENT: PERRL, EOMI, no scleral icterus Cardiac: RRR, no rubs, murmurs or gallops Pulm: Clear to auscultation bilaterally, no wheezes, rales, or rhonchi Abd: Soft, nontender, nondistended, BS present and hyperactive Ext: warm and well perfused, no pedal edema Neuro: alert and oriented X3, cranial nerves II-XII grossly intact, nonfocal  Lab Results: Basic Metabolic Panel:  Recent Labs Lab 07/20/13 2333 07/21/13 0545 07/21/13 1530  NA 138 139  --   K 4.5 4.0  --   CL 101 103  --   CO2 24 24  --   GLUCOSE 281* 156*  --   BUN 12 10  --   CREATININE 1.00 0.96  --   CALCIUM 8.1* 8.3*  --   MG  --   --  1.8   Liver Function Tests:  Recent Labs Lab 07/20/13 1009  AST 19  ALT 15  ALKPHOS 159*  BILITOT 0.3  PROT 7.1  ALBUMIN 3.4*    Recent Labs Lab 07/20/13 2011  LIPASE 36    CBC:  Recent Labs Lab 07/20/13 1009  WBC 4.9  NEUTROABS 3.1  HGB 13.9  HCT 41.2  MCV 86.4  PLT 304   Cardiac Enzymes:  Recent Labs Lab 07/20/13 2011 07/20/13 2333 07/21/13 0545  TROPONINI <0.30 <0.30 <0.30   CBG:  Recent Labs Lab 07/21/13 0819 07/21/13 1219 07/21/13 1238 07/21/13 1733  07/21/13 2017 07/22/13 0740  GLUCAP 151* 519* 466* 354* 208* 262*   Hemoglobin A1C:  Recent Labs Lab 07/20/13 2011  HGBA1C 15.3*   Thyroid Function Tests:  Recent Labs Lab 07/20/13 2011 07/21/13 0726  TSH 114.583*  --   FREET4  --  0.36*   Urine Drug Screen: Drugs of Abuse     Component Value Date/Time   LABOPIA NONE DETECTED 04/08/2013 1931   LABOPIA NEG 01/23/2013 1507   COCAINSCRNUR NONE DETECTED 04/08/2013 1931   COCAINSCRNUR NEG 01/23/2013 1507   LABBENZ NONE DETECTED 04/08/2013 1931   LABBENZ NEG 01/23/2013 1507   AMPHETMU NONE DETECTED 04/08/2013 1931   THCU NONE DETECTED 04/08/2013 1931   LABBARB NONE DETECTED 04/08/2013 1931   LABBARB NEG 01/23/2013 1507    Alcohol Level:  Recent Labs Lab 07/20/13 2011  ETH <11   Urinalysis:  Recent Labs Lab 07/20/13 1024  COLORURINE YELLOW  LABSPEC 1.035*  PHURINE 5.5  GLUCOSEU >1000*  HGBUR TRACE*  BILIRUBINUR NEGATIVE  KETONESUR NEGATIVE  PROTEINUR NEGATIVE  UROBILINOGEN 0.2  NITRITE NEGATIVE  LEUKOCYTESUR MODERATE*   Misc. Labs: 07/20/12 TSH: 114.58  07/21/13 Free T4: 0.36  Micro Results:  Recent Results (from the past 240 hour(s))  MRSA PCR SCREENING     Status: None   Collection Time    07/20/13  6:08 PM      Result Value Ref Range Status   MRSA by PCR NEGATIVE  NEGATIVE Final   Comment:            The GeneXpert MRSA Assay (FDA     approved for NASAL specimens     only), is one component of a     comprehensive MRSA colonization     surveillance program. It is not     intended to diagnose MRSA     infection nor to guide or     monitor treatment for     MRSA infections.   Studies/Results: Dg Chest 2 View  07/20/2013   CLINICAL DATA:  Cough and congestion.  EXAM: CHEST  2 VIEW  COMPARISON:  PA and lateral chest 06/19/2013 and 07/03/2012.  FINDINGS: Mild linear atelectasis or scarring is seen in the left lung base. The lungs are otherwise clear. Heart size is normal. No pneumothorax or pleural  effusion.  IMPRESSION: No acute disease.   Electronically Signed   By: Inge Rise M.D.   On: 07/20/2013 11:16   Dg Cervical Spine Complete  07/20/2013   CLINICAL DATA:  Fall.  Right lateral neck pain.  EXAM: CERVICAL SPINE  4+ VIEWS  COMPARISON:  Plain film cervical spine 10/29/2012.  FINDINGS: Vertebral body height and alignment are normal. Intervertebral disc space height is maintained. Prevertebral soft tissues are unremarkable. Lung apices are clear.  IMPRESSION: Negative exam.   Electronically Signed   By: Inge Rise M.D.   On: 07/20/2013 11:17   Dg Lumbar Spine Complete  07/20/2013   CLINICAL DATA:  HYPERGLYCEMIA ,FALL  EXAM: LUMBAR SPINE - COMPLETE 4+ VIEW  COMPARISON:  DG LUMBAR SPINE COMPLETE dated 11/07/2012  FINDINGS: There is no evidence of lumbar spine fracture. There is minimal dextroscoliosis of the upper lumbar spine with a mild rotatory component. Intervertebral disc spaces are maintained.  IMPRESSION: No evidence of acute osseous abnormalities.   Electronically Signed   By: Margaree Mackintosh M.D.   On: 07/20/2013 11:17   Medications: I have reviewed the patient's current medications. Scheduled Meds: . amLODipine  5 mg Oral Daily  . aspirin EC  81 mg Oral Daily  . atorvastatin  80 mg Oral q1800  . brimonidine  1 drop Both Eyes BID  . gabapentin  300 mg Oral TID  . heparin  5,000 Units Subcutaneous 3 times per day  . insulin aspart  0-20 Units Subcutaneous TID WC  . insulin aspart  4 Units Subcutaneous TID WC  . insulin glargine  20 Units Subcutaneous QHS  . latanoprost  1 drop Both Eyes QHS  . levothyroxine  25 mcg Oral QAC breakfast  . miconazole  200 mg Vaginal QHS  . mometasone-formoterol  2 puff Inhalation BID  . nicotine  14 mg Transdermal Daily  . nitrofurantoin (macrocrystal-monohydrate)  100 mg Oral Q12H  . pantoprazole  40 mg Oral Daily  . sertraline  100 mg Oral Daily   Continuous Infusions: . dextrose 5 % and 0.45% NaCl     PRN Meds:.albuterol,  dextrose, ipratropium-albuterol, nitroGLYCERIN, oxyCODONE  Assessment/Plan: Principal Problem:   DKA (diabetic ketoacidosis) Active Problems:   Hypothyroidism   DM (diabetes mellitus)   Chronic back pain   HTN (hypertension)   Peripheral neuropathy   COPD (chronic obstructive pulmonary disease)   Hyperglycemia  Cocaine abuse   DKA (diabetic ketoacidoses)   UTI (urinary tract infection) 58yo F w/ PMH HTN,HLD, cocaine abuse, COPD, uncontrolled DM type 2, hypothyroidism, schizophrenia, depression and anxiety presents s/p fall 2/2 weakness and was found to be hyperglycemia with an anion gap concerning for DKA.   #Mild DKA: Resolved. Pt presented with hyperglycemia with AG metabolic acidosis. She was started on insulin gtt on admission. Gap closed x 2, patient was been transitioned off of insulin gtt; A1c 15.3. CBGs still elevated, increasing Lantus. This will need to be monitored closely as an outpatient.  - Up-titrate Lantus  - SSI     #Asymptomatic Baceruria: In the setting of mild DKA. S/p 1 dose ceftriaxone --> Bactrim DS.  -Bactrim x 3d, day 3/3   #Weakness: Pt reports multiple falls prior to admission. CT head and spinal films negative in the ED. Orthostatics negative this admission. Pt with uncontrolled hypothyroidism, which could be contributing. TSH 114.58 and Free T4 suppressed at 0.36. Synthroid restarted and will need to be up-titrated as an outpatient. Her poorly controlled blood sugars are likely contributing as well. PT recommends home health PT with 24hr supervision.  - Continue thyroxine (noncompliance at home).  - HH PT  #Severe Hypothyroidism: TSH 114.58 and Free T4 suppressed at 0.36. Patient reports med noncompliance, restarted at 36mcg, per weight her daily dose should be ~142mcg. This will likely need to be up-titrated as an outpatient.  #Vaginal itching: Yeast seen on UA in setting of uncontrolled DM  -No fluconazole given prolonged QTc -- treating with topical  miconazole (less systemic effect)  Cocaine abuse: Pt with a h/o cocaine abuse, and states that she last used 3 days ago. She endorses infrequent use, using only a few times a monht.  - SW consult for cessation   HTN: BP well controlled this admission. Pt is on CCB at home, which was continued on admission.  - Amlodipine 5mg  daily   Chronic back pain: Increased pain s/p fall today. Continuing home Oxycodone 10mg  q4h PRN. Pt does not appear over sedated, but will monitor on the Oxycodone IR.   CKD, stage 2: Stable. Cr 1.14 on admission, which is around her baseline, which seems to be around 1.10. Steadily improving. 0.96 on 07/21/13 was 0.96.  Recent Labs Lab 07/20/13 1009 07/20/13 2011 07/20/13 2115 07/20/13 2333 07/21/13 0545  CREATININE 1.14* 1.04 0.97 1.00 0.96    COPD: Stable. Pt on Advair and Dulera at home. Continued Dulera per formulary.   HLD: Last LDL 139 on 02/23/13. Pt on statin at home which was held on admission as the pt was NPO. Restarted when began tolerating diet.   Peripheral Neuropathy: Stable. Possibly contributing to her imbalance and falls.  - Continue home Gabapentin 300mg  TID   Gout: Stable. Was not taking Allopurinol at home.  - Restarting home Allopurinol  Paranoid Schizophrenia: Stable. On Haldol at home which was held 2/2 prolonged QTc on admission.  Depression: Stable.  - Continue home Sertraline 100mg  and Trazodone   Tobacco Abuse:  - Nicotine Patch  - Counseling prior to discharge    Dispo: D/c to home today.   The patient does have a current PCP Rebecca Eaton, MD) and does need an [facility] follow-up appointment after discharge.  The patient does not have transportation limitations that hinder transportation to clinic appointments.  .Services Needed at time of discharge: Y = Yes, Blank = No PT:   OT:   RN:   Equipment:   Other:  LOS: 2 days   Otho Bellows, MD 07/22/2013, 11:08 AM

## 2013-07-22 NOTE — Progress Notes (Signed)
Internal Medicine Attending  Date: 07/22/2013  Patient name: [name] Medical record number: [id number] Date of birth: 15-Sep-1955 Age: 58 y.o. Gender: female  I saw and evaluated the patient. I reviewed the resident's note by Dr. Eulas Post and I agree with the resident's findings and plans as documented in her progress note.  [name] was feeling well when seen on rounds this AM and agrees she is back to baseline and ready for discharge.  We will make sure she has prescriptions for her medications, especially the synthroid and antihypertensives that she has not been taking recently.  Follow-up will be with her Primary Care Provider.

## 2013-07-22 NOTE — Progress Notes (Deleted)
Name: [name] MRN: [id number] DOB: Feb 23, 1956 58 y.o. PCP: Rebecca Eaton, MD  Date of Admission: 07/20/2013  9:52 AM Date of Discharge: 07/22/2013 Attending Physician: Karren Cobble, MD  Discharge Diagnosis: Principal Problem:   DKA (diabetic ketoacidosis), mild 2/2 poorly controlled DM2 in the setting of medication non-compliance and possible UTI. Active Problems:   UTI (urinary tract infection)   Medication noncompliance   Possible vaginal candidiasis   Hypothyroidism, poorly controlled   DM (diabetes mellitus)   HTN (hypertension)   Peripheral neuropathy   COPD (chronic obstructive pulmonary disease)   Cocaine abuse, last use this week   Chronic back pain  Discharge Medications:   Medication List    STOP taking these medications       haloperidol 5 MG tablet  Commonly known as:  HALDOL     oxycodone 5 MG capsule  Commonly known as:  OXY-IR  Replaced by:  Oxycodone HCl 10 MG Tabs      TAKE these medications       albuterol 108 (90 BASE) MCG/ACT inhaler  Commonly known as:  PROVENTIL HFA;VENTOLIN HFA  Inhale 2 puffs into the lungs every 6 (six) hours as needed for wheezing.     albuterol (2.5 MG/3ML) 0.083% nebulizer solution  Commonly known as:  PROVENTIL  Inhale 3 mLs (2.5 mg total) into the lungs every 6 (six) hours as needed for wheezing.     allopurinol 300 MG tablet  Commonly known as:  ZYLOPRIM  Take 1 tablet (300 mg total) by mouth daily. For gout.     amLODipine 5 MG tablet  Commonly known as:  NORVASC  Take 1 tablet (5 mg total) by mouth daily. For hypertension.     aspirin EC 81 MG tablet  Take 1 tablet (81 mg total) by mouth daily. For platelet aggregation.     atorvastatin 80 MG tablet  Commonly known as:  LIPITOR  Take 1 tablet (80 mg total) by mouth every morning.     brimonidine 0.2 % ophthalmic solution  Commonly known as:  ALPHAGAN  Place 1 drop into both eyes 2 (two) times daily.     colchicine 0.6 MG tablet  Take 1  tablet (0.6 mg total) by mouth daily as needed (for gout flare-ups).     Fluticasone-Salmeterol 250-50 MCG/DOSE Aepb  Commonly known as:  ADVAIR DISKUS  Inhale 1 puff into the lungs 2 times daily at 12 noon and 4 pm.     gabapentin 100 MG capsule  Commonly known as:  NEURONTIN  Take 3 (160m) capsules for a dose of (3025m three times each day for neuropathic pain and anxiety.     glucose monitoring kit monitoring kit  1 each by Does not apply route as needed for other.     insulin glargine 100 UNIT/ML injection  Commonly known as:  LANTUS  Inject 0.3 mLs (30 Units total) into the skin at bedtime.     latanoprost 0.005 % ophthalmic solution  Commonly known as:  XALATAN  Place 1 drop into both eyes at bedtime.     levothyroxine 25 MCG tablet  Commonly known as:  SYNTHROID, LEVOTHROID  Take 1 tablet (25 mcg total) by mouth daily before breakfast.     miconazole 200 MG vaginal suppository  Commonly known as:  MICOTIN  Place 1 suppository (200 mg total) vaginally at bedtime.     mometasone-formoterol 100-5 MCG/ACT Aero  Commonly known as:  DULERA  Inhale 2 puffs into the lungs  2 (two) times daily.     nicotine 14 mg/24hr patch  Commonly known as:  NICODERM CQ - dosed in mg/24 hours  Place 14 mg onto the skin daily.     nitrofurantoin (macrocrystal-monohydrate) 100 MG capsule  Commonly known as:  MACROBID  Take 1 capsule (100 mg total) by mouth every 12 (twelve) hours.     nitroGLYCERIN 0.4 MG SL tablet  Commonly known as:  NITROSTAT  Place 1 tablet (0.4 mg total) under the tongue every 5 (five) minutes as needed for chest pain.     Oxycodone HCl 10 MG Tabs  Take 1 tablet (10 mg total) by mouth every 4 (four) hours as needed for severe pain.     pantoprazole 40 MG tablet  Commonly known as:  PROTONIX  Take 1 tablet (40 mg total) by mouth daily. For reflux.     sertraline 100 MG tablet  Commonly known as:  ZOLOFT  Take 100 mg by mouth daily.     traZODone 100 MG  tablet  Commonly known as:  DESYREL  Take 1 tablet (100 mg total) by mouth at bedtime.        Disposition and follow-up:   [name] was discharged from [facility] in Stable condition.  At the hospital follow up visit please address:  1.  Her medication compliance with her insulin regimen and Levothyroxine.   - Her Lantus was increased to 30u qhs from her home dose of 20u qhs. This will need to be closely monitored as an outpatient.  - She has not been taking the Levothyroxine 2/2 financial constraints (TSH was 114.58 with Free T4 0.36).  Her weight-based dose of Levothyroxine is around 123mg.  This was restarted in the hospital and will need to be followed closely and titrated up as an outpatient.  2.  Check QTc:  On admission, patient's QTc was 618 and her Haldol was d/c'd.  Please recheck her QTc prior to prescribing back her daily Haldol. If QTc remains elevated, consider alternate to Trazodone as this can also prolong the QTc.  3. The resolution of her vaginal itching.  She was treated with Miconazole vaginal suppositories, however, if the itching does not resolve, she will need further evaluation.  4.  Labs / imaging needed at time of follow-up: None  5  Pending labs/ test needing follow-up: None  Follow-up Appointments: Follow-up Information   Follow up with KVertell Novak MD On 07/30/2013. (At 2:00pm)    Specialty:  Internal Medicine   Contact information:   [phone]      Discharge Instructions: Discharge Orders   Future Appointments Provider Department Dept Phone   07/30/2013 2:00 PM EOlga Millers MD [phone]  08/11/2013 10:00 AM CGatha Mayer MD [facility] [phone]  Future Orders Complete By Expires   Call MD for:  difficulty breathing, headache or visual disturbances  As directed    Call MD for:  extreme fatigue  As  directed    Call MD for:  persistant dizziness or light-headedness  As directed    Call MD for:  persistant nausea and vomiting  As directed    Diet Carb Modified  As directed    Discharge instructions  As directed    Comments:     Stop taking the Haldol until you are seen in the clinic, as it can affect your heart rhythm.  Be sure to take your insulin as prescribed and check your blood sugars around the same time every day before meals. Please be sure to bring your meter with you to your next clinic visit, which will be next week. If you have any blood sugars below 70 or if they are very high, above 350, please call the clinic as you may need to be seen sooner.   Increase activity slowly  As directed       Consultations:  None  Procedures Performed:  Dg Chest 2 View  07/20/2013 IMPRESSION: No acute disease.   Electronically Signed   By: Inge Rise M.D.   On: 07/20/2013 11:16   Dg Cervical Spine Complete  07/20/2013  IMPRESSION: Negative exam.   Electronically Signed   By: Inge Rise M.D.   On: 07/20/2013 11:17   Dg Lumbar Spine Complete  07/20/2013  IMPRESSION: No evidence of acute osseous abnormalities.   Electronically Signed   By: Margaree Mackintosh M.D.   On: 07/20/2013 11:17   Ct Head Wo Contrast  07/20/2013 IMPRESSION: 1. There is no evidence of acute ischemic or hemorrhagic infarction. 2. There is no intracranial mass effect or hydrocephalus. 3. 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   Electronically Signed   By: David  [place]   On: 07/20/2013 10:35   Admission HPI:  Ms. Harten is a 58 year old woman with a PMH of T2DM on insulin, Hypothyroidism, Hypertension, Hyperlipidemia, Paranoid Schizophrenia, and Depression who presented to the [facility] ED this morning (2/23) after a fall she had at home. She states she has been weak for some time - from her history it is unclear exactly how long. While she was walking down  her steps this morning, she felt woozy and dizzy, and like "her knee gave out." She fell and hit her head and back. She does not believe she lost consciousness. She has fallen before and it usually happens because she is "dizzy and woozy."  She came in to [facility] ED, where a CT head showed no acute ischemia or hemorrhagic infarct. Xrays of her lumbar spine and cervical spine showed no acute osseous abnormalities. She was found to have a CBG of 678, as well as an AG of 18, and a UA significant for WBCs. She was thought to be in DKA, precipitated by UTI, and so was started on fluid resuscitation and insulin drip.   On interview upon transfer, she states that she continues to feel a bit woozy. She checks her blood glucose daily and states it normally runs in the 300s. She takes Lantus 20U nightly, as well as Novolog 4U TID. The past several days her CBGs have run higher but she cannot remember the exact levels. She has chronic nausea that she attributes to pancreatitis, but has not had any acute nausea or vomiting. She has chronic abdominal pain that she also attributes to pancreatitis that starts in the middle of her stomach and radiates towards her back. She is experiencing this pain now and feels it's been made worse by her fall. She states she has several bouts of pancreatitis a year. She denies any dysuria or hematuria. She has had increased frequency over the past several days, as well as labial burning with wiping. She states she has a chest pain that feels "heavy" in the middle of her chest and does not radiate anywhere. She states that she feels a little short of breath now and  attributes that to her asthma and that she currently needs a breathing treatment. She endorses back and neck pain currently from her fall. Her eyes do feel a bit itchy and her vision is blurry, which she associates with high CBGs. She normally goes in and out of blurry vision.  Review of Systems:  Pertinent items are noted in  HPI.   Physical Exam:  Blood pressure 124/89, pulse 74, temperature 97.8 F (36.6 C), temperature source Oral, resp. rate 19, SpO2 95.00%.  General: Pleasant female, NAD, and cooperative on examination.  Head: Normocephalic and atraumatic.  Eyes: Pupils equal, round, and reactive to light, no injection and anicteric.  Mouth: Pharynx pink and moist, no erythema or exudates.  Neck: Supple, full ROM, no thyromegaly  Lungs: Normal respiratory effort, no accessory muscle use. Crackles appreciated at the right base, otherwise clear Heart: Regular rate, regular rhythm, no murmur, no gallop, and no rub.  Abdomen: Soft, non-distended, normal bowel sounds, no guarding, no rebound tenderness, no organomegaly. +TTP in midepigastrium with diffuse mild abdominal TTP  Msk: No joint swelling, warmth, or erythema.  Extremities: 2+ radial and DP pulses bilaterally. No cyanosis, clubbing, edema Neurologic: Alert & oriented X3, cranial nerves II-XII intact, strength equal in all extremities, but diffusely diminished 3/5, asymmetric sensation to light touch, with asymmetric sensation to the face, L>R, not acute per pt, and gait not evaluated.  Skin: Turgor normal and no rashes.  Psych: Memory intact for recent and remote, normally interactive, good eye contact, not anxious appearing, and not depressed appearing.   Hospital Course by problem list: 58yo F w/ PMH HTN,HLD, cocaine abuse, COPD, uncontrolled DM type 2, hypothyroidism, schizophrenia, depression and anxiety presents s/p fall 2/2 weakness and was found to be hyperglycemia with an anion gap concerning for DKA.   #Mild DKA: Pt with a h/o uncontrolled T2DM with HgbA1c 15.3% and poor medication compliance 2/2 financial constraints. On admission, pt with CBG of 678 and AG of 18 and no ketones in the urine.  She was noted to have a mild UTI on admission, and was given a dose of Rocephin in the ED in the thought that this could be the cause for her mild DKA.  She  was fluid resuscitated and started on an insulin drip in the ED which was continued on admission. Her anion gap subsequently closed and her CBGs dropped below 250, and she was transitioned to basal Lantus QHS and started on a carb modified diet. She was continued on a 7 days of abx for complicated UTI with Nitrofurantoin.  At the time of discharge, she was tolerating a carb-modified diet. Her Lantus was increased from her home dose of 20u qhs to 30u qhs this admission, as she was continuing to require large amounts of additional insulin coverage on the 20u Lantus. Her Lantus was increased based on the amount of SSI she was receiving. She was not prescribed Novolog coverage in an effort to simplify her regimen for better compliance. She was asked to check her CBGs at least once a day before the same meal each day, and was given a prescription for a new meter. - Continue Macrobid BID x7 days total - Continue Lantus 30u qhs - She was asked to bring her meter to her next clinic visit  #Hypothyroidism: TSH 114.58 and Free T4 suppressed at 0.36 on admission.  Patient symptomatic with weakness, constipation, dry hair, and dry skin.  She was on 342m Synthroid at home, but endorsed medication noncompliance 2/2 financial  constraints.  She was restarted on Synthroid at a much lower dose, as her Synthroid dosing seemed to continue to increase 2/2 poor control in the setting of medication noncompliance. Of note, her QTc was prolonged at 618.  Hypothyroidism is a known cause of prolonged QTc, however, she is also on other meds which are known to prolong QTc.  A rechecked QTc was 499 the day prior to discharge.  Her weight-based daily dose should be ~158mg. This will need to be up-titrated as an outpatient.   - Synthroid 2228m daily  #Weakness and Dizziness: Patient reports multiple falls prior to admission, most due to weakness and dizziness. CT head and spinal films negative in ED.  Orthostatics negative this  admission.  Symptoms not consistent with vertigo. No focal neurological deficits to suggest CVA. Etiology of her persistent weakness is likely multifactorial 2/2 uncontrolled hypothyroidism, uncontrolled DM2, chronic back and lower extremity pain, and possibly physical deconditioning.  She also has peripheral neuropathy and poor eyesight which are also likely factors.  She was seen by PT in the hospital who recommended home health PT.  - HH PT  #Hypertension: BP well controlled this admission. Pt is on Amlodipine at home, but endorsed noncompliance with this medication. We restarted the Amlodipine this admission, which she is to continue at discharge. - Amlodipine 28m33maily  Asymptomatic Bacteruria: On admission, she was found to have bacteruria on her UA, but was asymptomatic.  In the setting of mild DKA, she was treated initially with one dose of IV Ceftriaxone which was transitioned to Macrobid x 7days. Macrobid was chosen given her prolonged QTc.  #Vaginal Candidiasis: On admission, patient was symptomatic with labial itching and burning.  Yeast was present on UA in the setting of uncontrolled DM2.  She was treated with Miconazole vaginal suppositories while in the hospital as Fluconazole can prolong QTc and Miconazole has less systemic effects.   #Depression/Paranoid Schizophrenia: Stable, w/o signs of depression or psychosis while inpatient. She is on sertraline, trazodone, and Haldol at home.  The Haldol was held on admission 2/2 to her prolonged QTc of 618. Her QTc was improved to 499 by HD#2.  She was advised to hold the Haldol after discharge until her appointment with her PCP at which time a QTc will need to be rechecked, and hopefully the medication can be restarted. She will also need close f/u with her Psychiatrist. She is to continue the sertraline and trazodone.   Her other chronic medical conditions remained stable throughout this admission.   Discharge Vitals:   BP 121/79  Pulse 66   Temp(Src) 97.5 F (36.4 C) (Oral)  Resp 18  Ht 5' 2"  (1.575 m)  Wt 186 lb 12.8 oz (84.732 kg)  BMI 34.16 kg/m2  SpO2 94%  Discharge Labs:  Results for orders placed during the hospital encounter of 07/20/13 (from the past 24 hour(s))  MAGNESIUM     Status: None   Collection Time    07/21/13  3:30 PM      Result Value Ref Range   Magnesium 1.8  1.5 - 2.5 mg/dL  GLUCOSE, CAPILLARY     Status: Abnormal   Collection Time    07/21/13  5:33 PM      Result Value Ref Range   Glucose-Capillary 354 (*) 70 - 99 mg/dL  GLUCOSE, CAPILLARY     Status: Abnormal   Collection Time    07/21/13  8:17 PM      Result Value Ref Range   Glucose-Capillary  208 (*) 70 - 99 mg/dL  GLUCOSE, CAPILLARY     Status: Abnormal   Collection Time    07/22/13  7:40 AM      Result Value Ref Range   Glucose-Capillary 262 (*) 70 - 99 mg/dL  GLUCOSE, CAPILLARY     Status: Abnormal   Collection Time    07/22/13 11:15 AM      Result Value Ref Range   Glucose-Capillary 323 (*) 70 - 99 mg/dL    Signed: Otho Bellows, MD 07/22/2013, 1:30 PM   Time Spent on Discharge: 35 minutes Services Ordered on Discharge: [place] on Discharge: None

## 2013-07-22 NOTE — Progress Notes (Signed)
   CARE MANAGEMENT NOTE 07/22/2013  Patient:  EZELLA, KELL   Account Number:  [id number]  Date Initiated:  07/22/2013  Documentation initiated by:  Lizabeth Leyden  Subjective/Objective Assessment:   admitted with hyperglycemia, fall     Action/Plan:   medication costs   Anticipated DC Date:  07/22/2013   Anticipated DC Plan:  [place]  CM consult      [facility] Choice  HOME HEALTH   Choice offered to / List presented to:  C-1 Patient        [place] arranged  [place] [facility]      [place].   Status of service:  Completed, signed off Medicare Important Message given?   (If response is "NO", the following Medicare IM given date fields will be blank) Date Medicare IM given:   Date Additional Medicare IM given:    Discharge Disposition:  [place]  Per UR Regulation:    If discussed at Long Length of Stay Meetings, dates discussed:    Comments:  07/22/2013  [place], [place] [phone] CM referral: home health PT patient chose [facility] called with referral  07/22/2013  [place], [place] [phone] CM referral: medication costs  Met with patient to discuss medication costs. She verified having Medicare and Medicaid for medications, however this month she has limited funds. Advised her NCM is unable to assist her with her two insurances for medications. She verbalized understanding.

## 2013-07-22 NOTE — Progress Notes (Signed)
   CARE MANAGEMENT NOTE 07/22/2013  Patient:  [name], [name]   Account Number:  [id number]  Date Initiated:  07/22/2013  Documentation initiated by:  Lizabeth Leyden  Subjective/Objective Assessment:   admitted with hyperglycemia, fall     Action/Plan:   medication costs   Anticipated DC Date:  07/22/2013   Anticipated DC Plan:  [place]  CM consult      Choice offered to / List presented to:             Status of service:  Completed, signed off Medicare Important Message given?   (If response is "NO", the following Medicare IM given date fields will be blank) Date Medicare IM given:   Date Additional Medicare IM given:    Discharge Disposition:  HOME/SELF CARE  Per UR Regulation:    If discussed at Long Length of Stay Meetings, dates discussed:    Comments:  07/22/2013  [place], [place] [phone] CM referral: medication costs  Met with patient to discuss medication costs. She verified having Medicare and Medicaid for medications, however this month she has limited funds. Advised her NCM is unable to assist her with her two insurances for medications. She verbalized understanding.

## 2013-07-22 NOTE — Evaluation (Signed)
Occupational Therapy Evaluation Patient Details Name: [name] MRN: [id number] DOB: 10-20-1955 Today's Date: 07/22/2013 Time: 1000-1023 OT Time Calculation (min): 23 min  OT Assessment / Plan / Recommendation History of present illness Pt admit with DKA, weakness and fall.   Clinical Impression   Pt demonstrated ADL and ADL transfers at a modified independent level (used RW).  Has an aide at home to assist with IADL and supervise tub transfers as needed.  Pt could benefit from a shower seat with a back at home for safety and energy conservation.  No further OT needs.   OT Assessment  Patient does not need any further OT services    Follow Up Recommendations  No OT follow up;Supervision - Intermittent    Barriers to Discharge      Equipment Recommendations  Tub/shower seat    Recommendations for Other Services    Frequency       Precautions / Restrictions Precautions Precautions: Fall Precaution Comments: Pt reports several falls at home when not using RW.   Restrictions Weight Bearing Restrictions: No   Pertinent Vitals/Pain VSS, no pain reported   ADL  Eating/Feeding: Independent Where Assessed - Eating/Feeding: Edge of bed Grooming: Wash/dry hands;Independent Where Assessed - Grooming: Unsupported standing Upper Body Bathing: Modified independent Where Assessed - Upper Body Bathing: Unsupported sitting Lower Body Bathing: Modified independent Where Assessed - Lower Body Bathing: Unsupported sitting;Supported sit to stand Upper Body Dressing: Modified independent Where Assessed - Upper Body Dressing: Unsupported sitting Lower Body Dressing: Modified independent Where Assessed - Lower Body Dressing: Unsupported sitting;Supported sit to stand Toilet Transfer: Modified independent Toilet Transfer Method: Sit to Loss adjuster, chartered: Regular height toilet;Grab bars Toileting - Clothing Manipulation and Hygiene: Independent Where Assessed - Building control surveyor and Hygiene: Sit on 3-in-1 or toilet Equipment Used: Rolling walker Transfers/Ambulation Related to ADLs: mod I ADL Comments: Pt fatigues with activity, could benefit from shower seat.    OT Diagnosis:    OT Problem List:   OT Treatment Interventions:     OT Goals(Current goals can be found in the care plan section) Acute Rehab OT Goals Patient Stated Goal: to go home  Visit Information  Last OT Received On: 07/22/13 Assistance Needed: +1 History of Present Illness: Pt admit with DKA, weakness and fall.       Prior [place] expects to be discharged to:: Private residence Living Arrangements: Spouse/significant other Available Help at Discharge: Family;Available 24 hours/day;Personal care attendant Type of Home: House Home Access: Stairs to enter [employer] of Steps: 7 Entrance Stairs-Rails: Left Home Layout: One level Home Equipment: Cane - single point;Walker - 2 wheels Prior Function Level of Independence: Needs assistance ADL's / Homemaking Assistance Needed: aide does meal prep, laundry and cleaning for pt and husband Communication Communication: No difficulties Dominant Hand: Right         Vision/Perception Vision - History Patient Visual Report: No change from baseline   Cognition  Cognition Arousal/Alertness: Awake/alert Behavior During Therapy: WFL for tasks assessed/performed Overall Cognitive Status: Within Functional Limits for tasks assessed    Extremity/Trunk Assessment Upper Extremity Assessment Upper Extremity Assessment: Overall WFL for tasks assessed Lower Extremity Assessment Lower Extremity Assessment: Defer to PT evaluation Cervical / Trunk Assessment Cervical / Trunk Assessment: Normal     Mobility Bed Mobility Overal bed mobility: Independent Transfers Overall transfer level: Modified independent Equipment used: Rolling walker (2 wheeled) Transfers: Sit to/from  Stand Sit to Stand: Modified independent (  Device/Increase time)     Exercise     Balance Balance Standing balance-Leahy Scale: Good   End of Session OT - End of Session Activity Tolerance: Patient tolerated treatment well Patient left: in bed;with call bell/phone within reach;with bed alarm set;with nursing/sitter in room  GO Functional Assessment Tool Used: clinical judgement Functional Limitation: Self care Self Care Current Status (N0539): At least 1 percent but less than 20 percent impaired, limited or restricted Self Care Goal Status (J6734): At least 1 percent but less than 20 percent impaired, limited or restricted Self Care Discharge Status [id number]): At least 1 percent but less than 20 percent impaired, limited or restricted   Malka So 07/22/2013, 10:24 AM [phone]

## 2013-07-25 DIAGNOSIS — M6281 Muscle weakness (generalized): Secondary | ICD-10-CM | POA: Diagnosis present

## 2013-07-25 NOTE — Discharge Summary (Signed)
Name: [name] MRN: [id number] DOB: February 19, 1956 58 y.o. PCP: Rebecca Eaton, MD  Date of Admission: 07/20/2013  9:52 AM Date of Discharge: 07/22/2013 Attending Physician: Karren Cobble, MD  Discharge Diagnosis: Principal Problem:   DKA (diabetic ketoacidosis), mild 2/2 poorly controlled DM2 in the setting of medication non-compliance and possible UTI. Active Problems:   Medication noncompliance   Hypothyroidism, poorly controlled   Generalized muscle weakness with dizziness likely 2/2 medical comorbidities   UTI (urinary tract infection), mild   Possible vaginal candidiasis   DM (diabetes mellitus)   HTN (hypertension)   Depression with psychosis    Peripheral neuropathy   COPD (chronic obstructive pulmonary disease)   Cocaine abuse, last use this week   Chronic back pain  Discharge Medications:   Medication List    STOP taking these medications       haloperidol 5 MG tablet  Commonly known as:  HALDOL     oxycodone 5 MG capsule  Commonly known as:  OXY-IR  Replaced by:  Oxycodone HCl 10 MG Tabs      TAKE these medications       albuterol 108 (90 BASE) MCG/ACT inhaler  Commonly known as:  PROVENTIL HFA;VENTOLIN HFA  Inhale 2 puffs into the lungs every 6 (six) hours as needed for wheezing.     albuterol (2.5 MG/3ML) 0.083% nebulizer solution  Commonly known as:  PROVENTIL  Inhale 3 mLs (2.5 mg total) into the lungs every 6 (six) hours as needed for wheezing.     allopurinol 300 MG tablet  Commonly known as:  ZYLOPRIM  Take 1 tablet (300 mg total) by mouth daily. For gout.     amLODipine 5 MG tablet  Commonly known as:  NORVASC  Take 1 tablet (5 mg total) by mouth daily. For hypertension.     aspirin EC 81 MG tablet  Take 1 tablet (81 mg total) by mouth daily. For platelet aggregation.     atorvastatin 80 MG tablet  Commonly known as:  LIPITOR  Take 1 tablet (80 mg total) by mouth every morning.     brimonidine 0.2 % ophthalmic solution   Commonly known as:  ALPHAGAN  Place 1 drop into both eyes 2 (two) times daily.     colchicine 0.6 MG tablet  Take 1 tablet (0.6 mg total) by mouth daily as needed (for gout flare-ups).              gabapentin 100 MG capsule  Commonly known as:  NEURONTIN  Take 3 (186m) capsules for a dose of (3016m three times each day for neuropathic pain and anxiety.     glucose monitoring kit monitoring kit  1 each by Does not apply route as needed for other.     insulin glargine 100 UNIT/ML injection  Commonly known as:  LANTUS  Inject 0.3 mLs (30 Units total) into the skin at bedtime.     latanoprost 0.005 % ophthalmic solution  Commonly known as:  XALATAN  Place 1 drop into both eyes at bedtime.     levothyroxine 25 MCG tablet  Commonly known as:  SYNTHROID, LEVOTHROID  Take 1 tablet (25 mcg total) by mouth daily before breakfast.     miconazole 200 MG vaginal suppository  Commonly known as:  MICOTIN  Place 1 suppository (200 mg total) vaginally at bedtime.     mometasone-formoterol 100-5 MCG/ACT Aero  Commonly known as:  DULERA  Inhale 2 puffs into the lungs 2 (two) times daily.  nicotine 14 mg/24hr patch  Commonly known as:  NICODERM CQ - dosed in mg/24 hours  Place 14 mg onto the skin daily.     nitrofurantoin (macrocrystal-monohydrate) 100 MG capsule  Commonly known as:  MACROBID  Take 1 capsule (100 mg total) by mouth every 12 (twelve) hours.     nitroGLYCERIN 0.4 MG SL tablet  Commonly known as:  NITROSTAT  Place 1 tablet (0.4 mg total) under the tongue every 5 (five) minutes as needed for chest pain.     Oxycodone HCl 10 MG Tabs  Take 1 tablet (10 mg total) by mouth every 4 (four) hours as needed for severe pain.     pantoprazole 40 MG tablet  Commonly known as:  PROTONIX  Take 1 tablet (40 mg total) by mouth daily. For reflux.     sertraline 100 MG tablet  Commonly known as:  ZOLOFT  Take 100 mg by mouth daily.     traZODone 100 MG tablet  Commonly  known as:  DESYREL  Take 1 tablet (100 mg total) by mouth at bedtime.        Disposition and follow-up:   [name] was discharged from [facility] in Stable condition.  At the hospital follow up visit please address:  1.  Her medication compliance with her insulin regimen, Levothyroxine, and with her inhaler.   - Her Lantus was increased to 30u qhs from her home dose of 20u qhs. This will need to be closely monitored as an outpatient.  - She has not been taking the Levothyroxine 2/2 financial constraints (TSH was 114.58 with Free T4 0.36).  Her weight-based dose of Levothyroxine is around 156mg.  This was restarted in the hospital and will need to be followed closely and titrated up as an outpatient. - She came in on Advair and [place] continued on admission and discharge. We are not sure why she was on both inhalers as an outpatient, but please be sure [facility]era and Advair.   2.  Check QTc:  On admission, patient's QTc was 618 and her Haldol was d/c'd.  Please recheck her QTc prior to prescribing back her daily Haldol. If QTc remains elevated, consider alternate to Trazodone as this can also prolong the QTc.  3. The resolution of her vaginal itching.  She was treated with Miconazole vaginal suppositories, however, if the itching does not resolve, she will need further evaluation.  4.  Labs / imaging needed at time of follow-up: None  5  Pending labs/ test needing follow-up: None  Follow-up Appointments: Follow-up Information   Follow up with KVertell Novak MD On 07/30/2013. (At 2:00pm)    Specialty:  Internal Medicine   Contact information:   [phone]      Discharge Instructions: Discharge Orders   Future Appointments Provider Department Dept Phone   07/30/2013 2:00 PM EOlga Millers MD [id number] [phone]  08/11/2013 10:00  AM CGatha Mayer MD [facility] [phone]  Future Orders Complete By Expires   Call MD for:  difficulty breathing, headache or visual disturbances  As directed    Call MD for:  extreme fatigue  As directed    Call MD for:  persistant dizziness or light-headedness  As directed    Call MD for:  persistant nausea and vomiting  As directed    Diet  Carb Modified  As directed    Discharge instructions  As directed    Comments:     Stop taking the Haldol until you are seen in the clinic, as it can affect your heart rhythm.   Be sure to take your insulin as prescribed and check your blood sugars around the same time every day before meals. Please be sure to bring your meter with you to your next clinic visit, which will be next week. If you have any blood sugars below 70 or if they are very high, above 350, please call the clinic as you may need to be seen sooner.   Increase activity slowly  As directed       Consultations:  None  Procedures Performed:  Dg Chest 2 View  07/20/2013 IMPRESSION: No acute disease.   Electronically Signed   By: Inge Rise M.D.   On: 07/20/2013 11:16   Dg Cervical Spine Complete  07/20/2013  IMPRESSION: Negative exam.   Electronically Signed   By: Inge Rise M.D.   On: 07/20/2013 11:17   Dg Lumbar Spine Complete  07/20/2013  IMPRESSION: No evidence of acute osseous abnormalities.   Electronically Signed   By: Margaree Mackintosh M.D.   On: 07/20/2013 11:17   Ct Head Wo Contrast  07/20/2013 IMPRESSION: 1. There is no evidence of acute ischemic or hemorrhagic infarction. 2. There is no intracranial mass effect or hydrocephalus. 3. 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   Electronically Signed   By: David  [place]   On: 07/20/2013 10:35   Admission HPI:  Ms. Melka is a 58 year old woman with a PMH of T2DM on insulin, Hypothyroidism, Hypertension, Hyperlipidemia, Paranoid  Schizophrenia, and Depression who presented to the [facility] ED this morning (2/23) after a fall she had at home. She states she has been weak for some time - from her history it is unclear exactly how long. While she was walking down her steps this morning, she felt woozy and dizzy, and like "her knee gave out." She fell and hit her head and back. She does not believe she lost consciousness. She has fallen before and it usually happens because she is "dizzy and woozy."  She came in to [facility] ED, where a CT head showed no acute ischemia or hemorrhagic infarct. Xrays of her lumbar spine and cervical spine showed no acute osseous abnormalities. She was found to have a CBG of 678, as well as an AG of 18, and a UA significant for WBCs. She was thought to be in DKA, precipitated by UTI, and so was started on fluid resuscitation and insulin drip.   On interview upon transfer, she states that she continues to feel a bit woozy. She checks her blood glucose daily and states it normally runs in the 300s. She takes Lantus 20U nightly, as well as Novolog 4U TID. The past several days her CBGs have run higher but she cannot remember the exact levels. She has chronic nausea that she attributes to pancreatitis, but has not had any acute nausea or vomiting. She has chronic abdominal pain that she also attributes to pancreatitis that starts in the middle of her stomach and radiates towards her back. She is experiencing this pain now and feels it's been made worse by her fall. She states she has several bouts of pancreatitis a year. She denies any dysuria or hematuria. She has had increased frequency over the past several  days, as well as labial burning with wiping. She states she has a chest pain that feels "heavy" in the middle of her chest and does not radiate anywhere. She states that she feels a little short of breath now and attributes that to her asthma and that she currently needs a breathing treatment. She endorses  back and neck pain currently from her fall. Her eyes do feel a bit itchy and her vision is blurry, which she associates with high CBGs. She normally goes in and out of blurry vision.  Review of Systems:  Pertinent items are noted in HPI.   Physical Exam:  Blood pressure 124/89, pulse 74, temperature 97.8 F (36.6 C), temperature source Oral, resp. rate 19, SpO2 95.00%.  General: Pleasant female, NAD, and cooperative on examination.  Head: Normocephalic and atraumatic.  Eyes: Pupils equal, round, and reactive to light, no injection and anicteric.  Mouth: Pharynx pink and moist, no erythema or exudates.  Neck: Supple, full ROM, no thyromegaly  Lungs: Normal respiratory effort, no accessory muscle use. Crackles appreciated at the right base, otherwise clear Heart: Regular rate, regular rhythm, no murmur, no gallop, and no rub.  Abdomen: Soft, non-distended, normal bowel sounds, no guarding, no rebound tenderness, no organomegaly. +TTP in midepigastrium with diffuse mild abdominal TTP  Msk: No joint swelling, warmth, or erythema.  Extremities: 2+ radial and DP pulses bilaterally. No cyanosis, clubbing, edema Neurologic: Alert & oriented X3, cranial nerves II-XII intact, strength equal in all extremities, but diffusely diminished 3/5, asymmetric sensation to light touch, with asymmetric sensation to the face, L>R, not acute per pt, and gait not evaluated.  Skin: Turgor normal and no rashes.  Psych: Memory intact for recent and remote, normally interactive, good eye contact, not anxious appearing, and not depressed appearing.   Hospital Course by problem list: 58yo F w/ PMH HTN,HLD, cocaine abuse, COPD, uncontrolled DM type 2, hypothyroidism, schizophrenia, depression and anxiety presents s/p fall 2/2 weakness and was found to be hyperglycemia with an anion gap concerning for DKA.   #Mild DKA: Pt with a h/o uncontrolled T2DM with HgbA1c 15.3% and poor medication compliance 2/2 financial  constraints. On admission, pt with CBG of 678 and AG of 18 and no ketones in the urine.  She was noted to have a mild UTI on admission, and was given a dose of Rocephin in the ED in the thought that this could be the cause for her mild DKA.  She was fluid resuscitated and started on an insulin drip in the ED which was continued on admission. Her anion gap subsequently closed and her CBGs dropped below 250, and she was transitioned to basal Lantus QHS and started on a carb modified diet. She was continued on a 7 days of abx for complicated UTI with Nitrofurantoin.  At the time of discharge, she was tolerating a carb-modified diet. Her Lantus was increased from her home dose of 20u qhs to 30u qhs this admission, as she was continuing to require large amounts of additional insulin coverage on the 20u Lantus. Her Lantus was increased based on the amount of SSI she was receiving. She was not prescribed Novolog coverage in an effort to simplify her regimen for better compliance. She was asked to check her CBGs at least once a day before the same meal each day, and was given a prescription for a new meter. - Continue Macrobid BID x7 days total - Continue Lantus 30u qhs - She was asked to bring her meter to  her next clinic visit  #Hypothyroidism: TSH 114.58 and Free T4 suppressed at 0.36 on admission.  Patient symptomatic with weakness, constipation, dry hair, and dry skin.  She was on 355m Synthroid at home, but endorsed medication noncompliance 2/2 financial constraints.  She was restarted on Synthroid at a much lower dose, as her Synthroid dosing seemed to continue to increase 2/2 poor control in the setting of medication noncompliance. Of note, her QTc was prolonged at 618.  Hypothyroidism is a known cause of prolonged QTc, however, she is also on other meds which are known to prolong QTc.  A rechecked QTc was 499 the day prior to discharge.  Her weight-based daily dose should be ~1572m. This will need to be  up-titrated as an outpatient.   - Synthroid 2557mdaily  #Weakness and Dizziness, likely multifactorial: Patient reports multiple falls prior to admission, most due to weakness and dizziness. CT head and spinal films negative in ED.  Orthostatics negative this admission.  Symptoms not consistent with vertigo. No focal neurological deficits to suggest CVA. Etiology of her persistent weakness is likely multifactorial 2/2 uncontrolled hypothyroidism, uncontrolled DM2, chronic back and lower extremity pain, and possibly physical deconditioning.  She also has peripheral neuropathy and poor eyesight which are also likely factors.  She was seen by PT in the hospital who recommended home health PT.  - HH PT  #Hypertension: BP well controlled this admission. Pt is on Amlodipine at home, but endorsed noncompliance with this medication. We restarted the Amlodipine this admission, which she is to continue at discharge. - Amlodipine 5mg80mily  Asymptomatic Bacteruria: On admission, she was found to have bacteruria on her UA, but was asymptomatic.  In the setting of mild DKA, she was treated initially with one dose of IV Ceftriaxone which was transitioned to Macrobid x 7days. Macrobid was chosen given her prolonged QTc.  #Vaginal Candidiasis: On admission, patient was symptomatic with labial itching and burning.  Yeast was present on UA in the setting of uncontrolled DM2.  She was treated with Miconazole vaginal suppositories while in the hospital as Fluconazole can prolong QTc and Miconazole has less systemic effects.   #Prolonged QTc:  QTc was prolonged to 618 on admission EKG. She is on sertraline, trazodone, and Haldol at home for her depression with psychosis, all of which can result in QT interval prolongation. The Haldol was held on admission 2/2 to her prolonged QTc. Her QTc improved to 499 by HD#2.  She was advised to hold the Haldol after discharge until her appointment with her PCP at which time a QTc will  need to be rechecked, and hopefully the medication can be restarted if needed. This admission she was w/o signs of depression or psychosis. She will also need close f/u with her Psychiatrist. The sertraline and trazodone were continued at discharge.   #COPD: Her respiratory status was stable this admission. On admission, she was listed as taking both Advair and Dulera. However, she had questionable compliance with these inhalers. The Dule[place]he Mannan continued on admission and discharge. She is to stop taking the Adviar as these medications are from similar classes and there is an increased risk of adverse outcomes, especially in African Americans, with excessive use of long acting beta agonists.   Her other chronic medical conditions remained stable throughout this admission.   Discharge Vitals:   BP 121/79  Pulse 66  Temp(Src) 97.5 F (36.4 C) (Oral)  Resp 18  Ht 5' 2"  (1.575 m)  Wt 186 lb  12.8 oz (84.732 kg)  BMI 34.16 kg/m2  SpO2 94%  Discharge Labs:  Results for orders placed during the hospital encounter of 07/20/13 (from the past 24 hour(s))  MAGNESIUM     Status: None   Collection Time    07/21/13  3:30 PM      Result Value Ref Range   Magnesium 1.8  1.5 - 2.5 mg/dL  GLUCOSE, CAPILLARY     Status: Abnormal   Collection Time    07/21/13  5:33 PM      Result Value Ref Range   Glucose-Capillary 354 (*) 70 - 99 mg/dL  GLUCOSE, CAPILLARY     Status: Abnormal   Collection Time    07/21/13  8:17 PM      Result Value Ref Range   Glucose-Capillary 208 (*) 70 - 99 mg/dL  GLUCOSE, CAPILLARY     Status: Abnormal   Collection Time    07/22/13  7:40 AM      Result Value Ref Range   Glucose-Capillary 262 (*) 70 - 99 mg/dL  GLUCOSE, CAPILLARY     Status: Abnormal   Collection Time    07/22/13 11:15 AM      Result Value Ref Range   Glucose-Capillary 323 (*) 70 - 99 mg/dL    Signed: Otho Bellows, MD 07/22/2013, 1:30 PM   Time Spent on Discharge: 35 minutes Services Ordered on  Discharge: [place] Ordered on Discharge: None

## 2013-07-30 ENCOUNTER — Ambulatory Visit: Payer: Self-pay | Admitting: Internal Medicine

## 2013-08-11 ENCOUNTER — Encounter: Payer: Self-pay | Admitting: Internal Medicine

## 2013-08-11 ENCOUNTER — Ambulatory Visit (INDEPENDENT_AMBULATORY_CARE_PROVIDER_SITE_OTHER): Payer: PRIVATE HEALTH INSURANCE | Admitting: Internal Medicine

## 2013-08-11 VITALS — BP 140/90 | HR 60 | Ht 62.0 in | Wt 184.4 lb

## 2013-08-11 DIAGNOSIS — R1013 Epigastric pain: Secondary | ICD-10-CM | POA: Insufficient documentation

## 2013-08-11 DIAGNOSIS — R1319 Other dysphagia: Secondary | ICD-10-CM

## 2013-08-11 DIAGNOSIS — E1165 Type 2 diabetes mellitus with hyperglycemia: Secondary | ICD-10-CM

## 2013-08-11 DIAGNOSIS — R1310 Dysphagia, unspecified: Secondary | ICD-10-CM

## 2013-08-11 DIAGNOSIS — IMO0002 Reserved for concepts with insufficient information to code with codable children: Secondary | ICD-10-CM

## 2013-08-11 DIAGNOSIS — E1149 Type 2 diabetes mellitus with other diabetic neurological complication: Secondary | ICD-10-CM

## 2013-08-11 DIAGNOSIS — R1314 Dysphagia, pharyngoesophageal phase: Secondary | ICD-10-CM

## 2013-08-11 NOTE — Assessment & Plan Note (Signed)
Hx consistent with this - needs EGD and possible dilation. Possible causes are stricture, cancer (doubt), infection (Candida w/ Hgb A1C 15%?) The risks and benefits as well as alternatives of endoscopic procedure(s) have been discussed and reviewed. All questions answered. The patient agrees to proceed.

## 2013-08-11 NOTE — Assessment & Plan Note (Signed)
Uncontrolled diabetes likely part of her GI sxs ? [facility] as drinking 3 Reg Pepsi/day

## 2013-08-11 NOTE — Progress Notes (Signed)
Subjective:    Patient ID: [name], female    DOB: Sep 20, 1955, 58 y.o.   MRN: [id number]  HPI The patient is here for evaluation of dysphagia and epigastric pain. She is describing frequent mid-sternal sticking of solid food followed by severe lower chest and epigastric pain that radiates to back. Food then passes or is vomited. Complains of frequent nausea. She has a hx of acute pancreatitis and says sxs are similar. I can see a 2014 admit with lipase 708 and CT [place] ok - no signs of chronic pancreatitis. Her PCP does ? If she might have this. No steatorrhea.  DM is not controlled well at all Lab Results  Component Value Date   HGBA1C 15.3* 07/20/2013   She drinks 2-3 cups of coffee daily and drinks 3 regular Pepsi's a day despite knowing she shouldn't.  Wt Readings from Last 3 Encounters:  08/11/13 184 lb 6.4 oz (83.643 kg)  07/21/13 186 lb 12.8 oz (84.732 kg)  06/22/13 190 lb 1.6 oz (86.229 kg)   Allergies  Allergen Reactions  . Hydrocodone-Acetaminophen Hives  . Metformin Diarrhea   Outpatient Prescriptions Prior to Visit  Medication Sig Dispense Refill  . albuterol (PROVENTIL HFA;VENTOLIN HFA) 108 (90 BASE) MCG/ACT inhaler Inhale 2 puffs into the lungs every 6 (six) hours as needed for wheezing.  1 Inhaler  2  . albuterol (PROVENTIL) (2.5 MG/3ML) 0.083% nebulizer solution Inhale 3 mLs (2.5 mg total) into the lungs every 6 (six) hours as needed for wheezing.  75 mL  12  . allopurinol (ZYLOPRIM) 300 MG tablet Take 1 tablet (300 mg total) by mouth daily. For gout.  30 tablet  3  . amLODipine (NORVASC) 5 MG tablet Take 1 tablet (5 mg total) by mouth daily. For hypertension.  30 tablet  3  . aspirin EC 81 MG tablet Take 1 tablet (81 mg total) by mouth daily. For platelet aggregation.  30 tablet  0  . atorvastatin (LIPITOR) 80 MG tablet Take 1 tablet (80 mg total) by mouth every morning.  30 tablet  2  . brimonidine (ALPHAGAN) 0.2 % ophthalmic solution Place 1 drop into both  eyes 2 (two) times daily.  5 mL  12  . colchicine 0.6 MG tablet Take 1 tablet (0.6 mg total) by mouth daily as needed (for gout flare-ups).      . Fluticasone-Salmeterol (ADVAIR DISKUS) 250-50 MCG/DOSE AEPB Inhale 1 puff into the lungs 2 times daily at 12 noon and 4 pm.  60 each  0  . gabapentin (NEURONTIN) 100 MG capsule Take 3 (122m) capsules for a dose of (3053m three times each day for neuropathic pain and anxiety.  270 capsule  0  . glucose monitoring kit (FREESTYLE) monitoring kit 1 each by Does not apply route as needed for other.  1 each  1  . insulin glargine (LANTUS) 100 UNIT/ML injection Inject 0.3 mLs (30 Units total) into the skin at bedtime.  10 mL  11  . latanoprost (XALATAN) 0.005 % ophthalmic solution Place 1 drop into both eyes at bedtime.  2.5 mL  0  . levothyroxine (SYNTHROID, LEVOTHROID) 25 MCG tablet Take 1 tablet (25 mcg total) by mouth daily before breakfast.  30 tablet  1  . miconazole (MICOTIN) 200 MG vaginal suppository Place 1 suppository (200 mg total) vaginally at bedtime.  2 suppository  0  . mometasone-formoterol (DULERA) 100-5 MCG/ACT AERO Inhale 2 puffs into the lungs 2 (two) times  daily.  1 Inhaler    . nicotine (NICODERM CQ - DOSED IN MG/24 HOURS) 14 mg/24hr patch Place 14 mg onto the skin daily.      . nitroGLYCERIN (NITROSTAT) 0.4 MG SL tablet Place 1 tablet (0.4 mg total) under the tongue every 5 (five) minutes as needed for chest pain.  30 tablet  3  . oxyCODONE 10 MG TABS Take 1 tablet (10 mg total) by mouth every 4 (four) hours as needed for severe pain.  30 tablet  0  . pantoprazole (PROTONIX) 40 MG tablet Take 1 tablet (40 mg total) by mouth daily. For reflux.  30 tablet  11  . sertraline (ZOLOFT) 100 MG tablet Take 100 mg by mouth daily.      . traZODone (DESYREL) 100 MG tablet Take 1 tablet (100 mg total) by mouth at bedtime.  30 tablet  0  . nitrofurantoin, macrocrystal-monohydrate, (MACROBID) 100 MG capsule Take 1 capsule (100 mg total) by mouth every  12 (twelve) hours.  9 capsule  0   No facility-administered medications prior to visit.   Past Medical History  Diagnosis Date  . Asthma     past 10 years  . Diabetes mellitus     past 5 years  . Glaucoma     dx 06/2011-[facility]  . Hypertension   . Hyperlipidemia   . Hyperthyroidism   . History of TIA (transient ischemic attack)     01/06/2011, x2  . Pancreatitis   . Gout   . Depression   . Renal disorder     chronic kidney disease, stage 3  . Chronic pain   . Glaucoma   . Arthritis     djd -lower back, left knee  . Degenerative disc disease   . Bipolar disorder   . Gastritis   . COPD (chronic obstructive pulmonary disease)   . Schizophrenia   . 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 Fatty liver     mild CT 06/2012  . DKA (diabetic ketoacidoses) 07/21/2013   Past Surgical History  Procedure Laterality Date  . Abdominal hysterectomy  1990    still has ovaries   . Colonoscopy     History   Social History  . Marital Status: Single    Spouse Name: N/A    Number of Children: 3  . Years of Education: N/A   Occupational History  . Disability    Social History Main Topics  . Smoking status: Current Some Day Smoker -- 0.01 packs/day for 30 years    Types: Cigarettes  . Smokeless tobacco: Never Used     Comment: smokes 2 cigs daily/uses patches.  . Alcohol Use: No     Comment: denied alcohol at this time  . Drug Use: Yes    Special: Cocaine     Comment: THC  cocaine  . Sexual Activity: Yes     Comment: hysterectomy   Other Topics Concern  . None   Social History Narrative   On disability   Single 3 sons   3 coffee/day   3 regular pepsi/day   Updated 08/11/2013                  Family History  Problem Relation Age of Onset  . Arthritis Mother   . Diabetes Mother   . Hypertension Mother   . Kidney disease Mother   . Heart disease Mother     died 82  . Heart disease  mother & father  . Hypertension      mother &  father  . Colon cancer Neg Hx   . Prostate cancer Neg Hx   . Breast cancer Neg Hx   . Stomach cancer Neg Hx   . Hypertension Sister   . Hypertension Brother   . Kidney disease Brother     Review of Systems + insomnia, anxious, depressive sxs, dyspnea, back pain, polyuria, polydypsea and polyphagia. All other ROS negative.    Objective:   Physical Exam General:  Well-developed, well-nourished and in no acute distress Eyes:  anicteric. ENT:   Mouth and posterior pharynx free of lesions.  Neck:   supple w/o thyromegaly or mass.  Lungs: Clear to auscultation bilaterally. Heart:  S1S2, no rubs, murmurs, gallops. Abdomen:  soft, non-tender, no hepatosplenomegaly, hernia, or mass and BS+.  No splash Lymph:  no cervical or supraclavicular adenopathy. Extremities:   no edema Skin   no rash. Neuro:  A&O x 3.  Psych:  appropriate mood and  Affect.   Data Reviewed:  As per HPI PCP notes Labs in EMR    Assessment & Plan:  Esophageal dysphagia Hx consistent with this - needs EGD and possible dilation. Possible causes are stricture, cancer (doubt), infection (Candida w/ Hgb A1C 15%?) The risks and benefits as well as alternatives of endoscopic procedure(s) have been discussed and reviewed. All questions answered. The patient agrees to proceed.   Abdominal pain, epigastric Is related to eating and the dysphagia so do not think it is pancreatitis. Lab Results  Component Value Date   LIPASE 36 07/20/2013   CT 2014 w/ pancreatic calcifications and no steatorrhea. May also have autonomic neuropathy and pain problems. Await EGD Is on a ppi  Type II diabetes mellitus with neurological manifestations, uncontrolled Uncontrolled diabetes likely part of her GI sxs ? Insight as drinking 3 Reg Pepsi/day   PT:ELMRAJHHI, Apolonio Schneiders, MD

## 2013-08-11 NOTE — Patient Instructions (Addendum)
You have been given a separate informational sheet regarding your tobacco use, the importance of quitting and local resources to help you quit.  You have been scheduled for an endoscopy with propofol. Please follow written instructions given to you at your visit today. If you use inhalers (even only as needed), please bring them with you on the day of your procedure. Your physician has requested that you go to www.startemmi.com and enter the access code given to you at your visit today. This web site gives a general overview about your procedure. However, you should still follow specific instructions given to you by our office regarding your preparation for the procedure.   I appreciate the opportunity to care for you.

## 2013-08-11 NOTE — Assessment & Plan Note (Addendum)
Is related to eating and the dysphagia so do not think it is pancreatitis. Lab Results  Component Value Date   LIPASE 36 07/20/2013   CT 2014 w/ pancreatic calcifications and no steatorrhea. May also have autonomic neuropathy and pain problems. Await EGD Is on a ppi

## 2013-08-12 ENCOUNTER — Encounter: Payer: Self-pay | Admitting: Internal Medicine

## 2013-08-12 ENCOUNTER — Ambulatory Visit (AMBULATORY_SURGERY_CENTER): Payer: PRIVATE HEALTH INSURANCE | Admitting: Internal Medicine

## 2013-08-12 VITALS — BP 143/85 | HR 62 | Temp 97.7°F | Resp 20 | Ht 62.0 in | Wt 184.0 lb

## 2013-08-12 DIAGNOSIS — R1314 Dysphagia, pharyngoesophageal phase: Secondary | ICD-10-CM | POA: Diagnosis not present

## 2013-08-12 DIAGNOSIS — R1013 Epigastric pain: Secondary | ICD-10-CM | POA: Diagnosis not present

## 2013-08-12 DIAGNOSIS — K2940 Chronic atrophic gastritis without bleeding: Secondary | ICD-10-CM

## 2013-08-12 DIAGNOSIS — K222 Esophageal obstruction: Secondary | ICD-10-CM

## 2013-08-12 HISTORY — DX: Esophageal obstruction: K22.2

## 2013-08-12 LAB — GLUCOSE, CAPILLARY
GLUCOSE-CAPILLARY: 336 mg/dL — AB (ref 70–99)
Glucose-Capillary: 363 mg/dL — ABNORMAL HIGH (ref 70–99)

## 2013-08-12 MED ORDER — SODIUM CHLORIDE 0.9 % IV SOLN: 500.0000 mL | INTRAVENOUS | Status: DC

## 2013-08-12 NOTE — Patient Instructions (Addendum)
I found and stretched a narrow area in the esophagus called a ring. This should help you swallow better. I took biopsies to look for gastritis - stomach inflammation.  Everything else is ok.  The most important thing you can do to feel better in many ways is to use diet, exercise and medication to control blood sugar and diabetes.  I appreciate the opportunity to care for you. Gatha Mayer, MD, FACG  YOU HAD AN ENDOSCOPIC PROCEDURE TODAY AT [facility]: Refer to the procedure report that was given to you for any specific questions about what was found during the examination.  If the procedure report does not answer your questions, please call your gastroenterologist to clarify.  If you requested that your care partner not be given the details of your procedure findings, then the procedure report has been included in a sealed envelope for you to review at your convenience later.  YOU SHOULD EXPECT: Some feelings of bloating in the abdomen. Passage of more gas than usual.  Walking can help get rid of the air that was put into your GI tract during the procedure and reduce the bloating. If you had a lower endoscopy (such as a colonoscopy or flexible sigmoidoscopy) you may notice spotting of blood in your stool or on the toilet paper. If you underwent a bowel prep for your procedure, then you may not have a normal bowel movement for a few days.  DIET: See dilations diet-  Drink plenty of fluids but you should avoid alcoholic beverages for 24 hours.  ACTIVITY: Your care partner should take you home directly after the procedure.  You should plan to take it easy, moving slowly for the rest of the day.  You can resume normal activity the day after the procedure however you should NOT DRIVE or use heavy machinery for 24 hours (because of the sedation medicines used during the test).    SYMPTOMS TO REPORT IMMEDIATELY: A gastroenterologist can be reached at any hour.  During normal  business hours, 8:30 AM to 5:00 PM Monday through Friday, call [phone].  After hours and on weekends, please call the GI answering service at [phone] who will take a message and have the physician on call contact you.    Following upper endoscopy (EGD)  Vomiting of blood or coffee ground material  New chest pain or pain under the shoulder blades  Painful or persistently difficult swallowing  New shortness of breath  Fever of 100F or higher  Black, tarry-looking stools  FOLLOW UP: If any biopsies were taken you will be contacted by phone or by letter within the next 1-3 weeks.  Call your gastroenterologist if you have not heard about the biopsies in 3 weeks.  Our staff will call the home number listed on your records the next business day following your procedure to check on you and address any questions or concerns that you may have at that time regarding the information given to you following your procedure. This is a courtesy call and so if there is no answer at the home number and we have not heard from you through the emergency physician on call, we will assume that you have returned to your regular daily activities without incident.  SIGNATURES/CONFIDENTIALITY: You and/or your care partner have signed paperwork which will be entered into your electronic medical record.  These signatures attest to the fact that that the information above on your After Visit Summary has been reviewed and  is understood.  Full responsibility of the confidentiality of this discharge information lies with you and/or your care-partner.  Dilation diet, stricture-handouts given  Wait pathology results.  Need better blood sugar control.

## 2013-08-12 NOTE — Op Note (Signed)
[facility]  [address] [place], [id number]   ENDOSCOPY PROCEDURE REPORT  PATIENT: [name], [name]  MR#: [id number] BIRTHDATE: Sep 30, 1955 , 78  yrs. old GENDER: Female ENDOSCOPIST: Gatha Mayer, MD, [facility] PROCEDURE DATE:  08/12/2013 PROCEDURE:  EGD w/ biopsy and Maloney dilation of esophagus ASA CLASS:     Class III INDICATIONS:  Dysphagia.   Epigastric pain. MEDICATIONS: Propofol (Diprivan) 120 mg IV, MAC sedation, administered by CRNA, and These medications were titrated to patient response per physician's verbal order TOPICAL ANESTHETIC: Cetacaine Spray  DESCRIPTION OF PROCEDURE: After the risks benefits and alternatives of the procedure were thoroughly explained, informed consent was obtained.  The LB [id number] endoscope was introduced through the mouth and advanced to the second portion of the duodenum. Without limitations.  The instrument was slowly withdrawn as the mucosa was fully examined.        ESOPHAGUS: A Schatzki ring was found at the gastroesophageal junction.  STOMACH: Abnormal mucosa was found in the gastric antrum.  The mucosa was erythematous and friable.  Multiple biopsies were performed using cold forceps.  Sample sent for histology.  The remainder of the upper endoscopy exam was otherwise normal. Retroflexed views revealed no abnormalities.     The scope was then withdrawn from the patient, a 27 Fr Maloney dilator was passed w/o difficulty, trace heme seen, and the procedure completed.  COMPLICATIONS: There were no complications. ENDOSCOPIC IMPRESSION: 1.   Schatzki ring was found at the gastroesophageal junction - dilate w/ 54 Fr Maloney 2.   Abnormal mucosa was found in the gastric antrum; The mucosa was erythematous and friable; multiple biopsies 3.   The remainder of the upper endoscopy exam was otherwise normal  RECOMMENDATIONS: 1.  Clear liquids until 2 PM , then soft foods rest of day.  Resume prior diet  tomorrow. 2.  Await pathology results 3.  Need better blood sugar control - suspect autonomic neuropathy   eSigned:  Gatha Mayer, MD, [facility] 08/12/2013 12:21 PM   CC:The Patient  and Rebecca Eaton, MD

## 2013-08-12 NOTE — Progress Notes (Signed)
Pt  c/o pain when pressing on her chest, rates it 8/10 upon arrival to recovery, Dr Carlean Purl was advised and checked pt, dr advises to keep eye on pt but feels like this is not from procedure, pt had c/o of chest pain prior to procedure, before discharge pt states pain is better, rates 6/10, Dr Olevia Perches also stopped by and checked on pt prior to discharge.Pt BS was 363 in recovery, Dr Carlean Purl already left for had meeting at hospital and left instructions that Dr Olevia Perches would help if we needed anything, advised Dr Olevia Perches of BS, she advise for pt to take insulin once she gets home and work on getting it down. BS was 336 on admission. -adm

## 2013-08-12 NOTE — Progress Notes (Signed)
Report to pacu rn, vss, bbs=clear 

## 2013-08-12 NOTE — Progress Notes (Signed)
Called to room to assist during endoscopic procedure.  Patient ID and intended procedure confirmed with present staff. Received instructions for my participation in the procedure from the performing physician.  

## 2013-08-13 ENCOUNTER — Telehealth: Payer: Self-pay | Admitting: *Deleted

## 2013-08-13 NOTE — Telephone Encounter (Signed)
  Follow up Call-  Call back number 08/12/2013 02/10/2013  Post procedure Call Back phone  # [phone]  Permission to leave phone message Yes Yes     Patient questions:  Do you have a fever, pain , or abdominal swelling? yes sore throat; suggested chloraseptic spray Pain Score  2 *  Have you tolerated food without any problems? yes  Have you been able to return to your normal activities? yes  Do you have any questions about your discharge instructions: Diet   no Medications  no Follow up visit  no  Do you have questions or concerns about your Care? no  Actions: * If pain score is 4 or above: No action needed, pain <4.

## 2013-08-16 ENCOUNTER — Encounter (HOSPITAL_COMMUNITY): Payer: Self-pay | Admitting: Emergency Medicine

## 2013-08-16 ENCOUNTER — Emergency Department (HOSPITAL_COMMUNITY): Payer: PRIVATE HEALTH INSURANCE

## 2013-08-16 ENCOUNTER — Inpatient Hospital Stay (HOSPITAL_COMMUNITY)
Admission: EM | Admit: 2013-08-16 | Discharge: 2013-08-18 | DRG: 638 | Disposition: A | Discharge status: Home / Self Care | Payer: PRIVATE HEALTH INSURANCE | Attending: Internal Medicine | Admitting: Internal Medicine

## 2013-08-16 DIAGNOSIS — F339 Major depressive disorder, recurrent, unspecified: Secondary | ICD-10-CM | POA: Diagnosis present

## 2013-08-16 DIAGNOSIS — M545 Low back pain, unspecified: Secondary | ICD-10-CM | POA: Diagnosis present

## 2013-08-16 DIAGNOSIS — H409 Unspecified glaucoma: Secondary | ICD-10-CM | POA: Diagnosis present

## 2013-08-16 DIAGNOSIS — M549 Dorsalgia, unspecified: Secondary | ICD-10-CM

## 2013-08-16 DIAGNOSIS — E1165 Type 2 diabetes mellitus with hyperglycemia: Secondary | ICD-10-CM

## 2013-08-16 DIAGNOSIS — Z91199 Patient's noncompliance with other medical treatment and regimen due to unspecified reason: Secondary | ICD-10-CM

## 2013-08-16 DIAGNOSIS — E1310 Other specified diabetes mellitus with ketoacidosis without coma: Secondary | ICD-10-CM | POA: Diagnosis present

## 2013-08-16 DIAGNOSIS — R079 Chest pain, unspecified: Secondary | ICD-10-CM | POA: Diagnosis present

## 2013-08-16 DIAGNOSIS — I10 Essential (primary) hypertension: Secondary | ICD-10-CM | POA: Diagnosis present

## 2013-08-16 DIAGNOSIS — M109 Gout, unspecified: Secondary | ICD-10-CM | POA: Diagnosis present

## 2013-08-16 DIAGNOSIS — R9431 Abnormal electrocardiogram [ECG] [EKG]: Secondary | ICD-10-CM | POA: Diagnosis present

## 2013-08-16 DIAGNOSIS — R55 Syncope and collapse: Secondary | ICD-10-CM

## 2013-08-16 DIAGNOSIS — N183 Chronic kidney disease, stage 3 unspecified: Secondary | ICD-10-CM | POA: Diagnosis present

## 2013-08-16 DIAGNOSIS — I129 Hypertensive chronic kidney disease with stage 1 through stage 4 chronic kidney disease, or unspecified chronic kidney disease: Secondary | ICD-10-CM | POA: Diagnosis present

## 2013-08-16 DIAGNOSIS — Z9119 Patient's noncompliance with other medical treatment and regimen: Secondary | ICD-10-CM

## 2013-08-16 DIAGNOSIS — Z8673 Personal history of transient ischemic attack (TIA), and cerebral infarction without residual deficits: Secondary | ICD-10-CM

## 2013-08-16 DIAGNOSIS — Z79899 Other long term (current) drug therapy: Secondary | ICD-10-CM

## 2013-08-16 DIAGNOSIS — F418 Other specified anxiety disorders: Secondary | ICD-10-CM | POA: Diagnosis present

## 2013-08-16 DIAGNOSIS — F209 Schizophrenia, unspecified: Secondary | ICD-10-CM | POA: Diagnosis present

## 2013-08-16 DIAGNOSIS — Z833 Family history of diabetes mellitus: Secondary | ICD-10-CM

## 2013-08-16 DIAGNOSIS — Z7982 Long term (current) use of aspirin: Secondary | ICD-10-CM

## 2013-08-16 DIAGNOSIS — F17200 Nicotine dependence, unspecified, uncomplicated: Secondary | ICD-10-CM | POA: Diagnosis present

## 2013-08-16 DIAGNOSIS — E039 Hypothyroidism, unspecified: Secondary | ICD-10-CM | POA: Diagnosis present

## 2013-08-16 DIAGNOSIS — G8929 Other chronic pain: Secondary | ICD-10-CM | POA: Diagnosis present

## 2013-08-16 DIAGNOSIS — E1110 Type 2 diabetes mellitus with ketoacidosis without coma: Secondary | ICD-10-CM

## 2013-08-16 DIAGNOSIS — F1410 Cocaine abuse, uncomplicated: Secondary | ICD-10-CM | POA: Diagnosis present

## 2013-08-16 DIAGNOSIS — Z841 Family history of disorders of kidney and ureter: Secondary | ICD-10-CM

## 2013-08-16 DIAGNOSIS — F319 Bipolar disorder, unspecified: Secondary | ICD-10-CM | POA: Diagnosis present

## 2013-08-16 DIAGNOSIS — IMO0002 Reserved for concepts with insufficient information to code with codable children: Secondary | ICD-10-CM | POA: Diagnosis present

## 2013-08-16 DIAGNOSIS — E785 Hyperlipidemia, unspecified: Secondary | ICD-10-CM | POA: Diagnosis present

## 2013-08-16 DIAGNOSIS — Z8249 Family history of ischemic heart disease and other diseases of the circulatory system: Secondary | ICD-10-CM

## 2013-08-16 DIAGNOSIS — Z8261 Family history of arthritis: Secondary | ICD-10-CM

## 2013-08-16 DIAGNOSIS — Z794 Long term (current) use of insulin: Secondary | ICD-10-CM

## 2013-08-16 DIAGNOSIS — F411 Generalized anxiety disorder: Secondary | ICD-10-CM | POA: Diagnosis present

## 2013-08-16 DIAGNOSIS — E1100 Type 2 diabetes mellitus with hyperosmolarity without nonketotic hyperglycemic-hyperosmolar coma (NKHHC): Principal | ICD-10-CM | POA: Diagnosis present

## 2013-08-16 DIAGNOSIS — J449 Chronic obstructive pulmonary disease, unspecified: Secondary | ICD-10-CM | POA: Diagnosis present

## 2013-08-16 DIAGNOSIS — R0789 Other chest pain: Secondary | ICD-10-CM | POA: Diagnosis present

## 2013-08-16 DIAGNOSIS — J4489 Other specified chronic obstructive pulmonary disease: Secondary | ICD-10-CM | POA: Diagnosis present

## 2013-08-16 DIAGNOSIS — E1149 Type 2 diabetes mellitus with other diabetic neurological complication: Secondary | ICD-10-CM | POA: Diagnosis present

## 2013-08-16 LAB — URINE MICROSCOPIC-ADD ON

## 2013-08-16 LAB — COMPREHENSIVE METABOLIC PANEL
ALBUMIN: 3.4 g/dL — AB (ref 3.5–5.2)
ALK PHOS: 171 U/L — AB (ref 39–117)
ALT: 25 U/L (ref 0–35)
AST: 26 U/L (ref 0–37)
BUN: 14 mg/dL (ref 6–23)
CO2: 22 mEq/L (ref 19–32)
Calcium: 9.4 mg/dL (ref 8.4–10.5)
Chloride: 87 mEq/L — ABNORMAL LOW (ref 96–112)
Creatinine, Ser: 1.04 mg/dL (ref 0.50–1.10)
GFR calc Af Amer: 69 mL/min — ABNORMAL LOW (ref >90)
GFR calc non Af Amer: 59 mL/min — ABNORMAL LOW (ref >90)
Glucose, Bld: 794 mg/dL (ref 70–99)
Potassium: 4.1 mEq/L (ref 3.7–5.3)
SODIUM: 128 meq/L — AB (ref 137–147)
Total Bilirubin: 0.3 mg/dL (ref 0.3–1.2)
Total Protein: 6.9 g/dL (ref 6.0–8.3)

## 2013-08-16 LAB — I-STAT TROPONIN, ED: Troponin i, poc: 0 ng/mL (ref 0.00–0.08)

## 2013-08-16 LAB — CBC
HCT: 39.5 % (ref 36.0–46.0)
Hemoglobin: 13.6 g/dL (ref 12.0–15.0)
MCH: 29.8 pg (ref 26.0–34.0)
MCHC: 34.4 g/dL (ref 30.0–36.0)
MCV: 86.6 fL (ref 78.0–100.0)
Platelets: 245 10*3/uL (ref 150–400)
RBC: 4.56 MIL/uL (ref 3.87–5.11)
RDW: 16.5 % — AB (ref 11.5–15.5)
WBC: 5.9 10*3/uL (ref 4.0–10.5)

## 2013-08-16 LAB — URINALYSIS, ROUTINE W REFLEX MICROSCOPIC
Bilirubin Urine: NEGATIVE
Glucose, UA: >1000 mg/dL — AB
Hgb urine dipstick: NEGATIVE
KETONES UR: NEGATIVE mg/dL
Leukocytes, UA: NEGATIVE
Nitrite: NEGATIVE
Protein, ur: NEGATIVE mg/dL
Specific Gravity, Urine: 1.033 — ABNORMAL HIGH (ref 1.005–1.030)
Urobilinogen, UA: 0.2 mg/dL (ref 0.0–1.0)
pH: 5.5 (ref 5.0–8.0)

## 2013-08-16 LAB — CBG MONITORING, ED

## 2013-08-16 MED ORDER — SODIUM CHLORIDE 0.9 % IV BOLUS (SEPSIS)
1000.0000 mL | Freq: Once | INTRAVENOUS | Status: AC
  Administered 2013-08-17: 1000 mL via INTRAVENOUS

## 2013-08-16 NOTE — ED Notes (Signed)
Pt taken to CT.

## 2013-08-16 NOTE — ED Notes (Signed)
Pt states that she lost her glucose meter and has not checked her CBG in a few days. Explained the danger of high sugars and the importance of checking her CBG's.

## 2013-08-16 NOTE — ED Notes (Signed)
Pt arrives via EMS from home. Since Friday she has had general weakness and "high" CBG on meter. Today pt states she felt weaker than normal. Pt states she had chest pain around 6p in the center of her chest. EKG showed NSR. 124/83 HR 78 92% RA CBG "high." 324 ASA given. No nitro given.

## 2013-08-17 ENCOUNTER — Encounter (HOSPITAL_COMMUNITY): Payer: Self-pay | Admitting: Emergency Medicine

## 2013-08-17 ENCOUNTER — Emergency Department (HOSPITAL_COMMUNITY): Payer: PRIVATE HEALTH INSURANCE

## 2013-08-17 ENCOUNTER — Encounter: Payer: Self-pay | Admitting: Internal Medicine

## 2013-08-17 DIAGNOSIS — E039 Hypothyroidism, unspecified: Secondary | ICD-10-CM

## 2013-08-17 DIAGNOSIS — N183 Chronic kidney disease, stage 3 unspecified: Secondary | ICD-10-CM

## 2013-08-17 DIAGNOSIS — I129 Hypertensive chronic kidney disease with stage 1 through stage 4 chronic kidney disease, or unspecified chronic kidney disease: Secondary | ICD-10-CM

## 2013-08-17 DIAGNOSIS — F1410 Cocaine abuse, uncomplicated: Secondary | ICD-10-CM

## 2013-08-17 DIAGNOSIS — J449 Chronic obstructive pulmonary disease, unspecified: Secondary | ICD-10-CM

## 2013-08-17 DIAGNOSIS — E1100 Type 2 diabetes mellitus with hyperosmolarity without nonketotic hyperglycemic-hyperosmolar coma (NKHHC): Secondary | ICD-10-CM | POA: Diagnosis present

## 2013-08-17 DIAGNOSIS — Z9119 Patient's noncompliance with other medical treatment and regimen: Secondary | ICD-10-CM

## 2013-08-17 DIAGNOSIS — E785 Hyperlipidemia, unspecified: Secondary | ICD-10-CM

## 2013-08-17 DIAGNOSIS — R079 Chest pain, unspecified: Secondary | ICD-10-CM | POA: Diagnosis present

## 2013-08-17 DIAGNOSIS — Z91199 Patient's noncompliance with other medical treatment and regimen due to unspecified reason: Secondary | ICD-10-CM

## 2013-08-17 DIAGNOSIS — E119 Type 2 diabetes mellitus without complications: Secondary | ICD-10-CM

## 2013-08-17 DIAGNOSIS — R9431 Abnormal electrocardiogram [ECG] [EKG]: Secondary | ICD-10-CM | POA: Diagnosis present

## 2013-08-17 DIAGNOSIS — J4489 Other specified chronic obstructive pulmonary disease: Secondary | ICD-10-CM

## 2013-08-17 DIAGNOSIS — F319 Bipolar disorder, unspecified: Secondary | ICD-10-CM

## 2013-08-17 LAB — I-STAT VENOUS BLOOD GAS, ED
Acid-base deficit: 2 mmol/L (ref 0.0–2.0)
Bicarbonate: 23.8 mEq/L (ref 20.0–24.0)
O2 Saturation: 94 %
PCO2 VEN: 45.3 mmHg (ref 45.0–50.0)
PH VEN: 7.329 — AB (ref 7.250–7.300)
PO2 VEN: 75 mmHg — AB (ref 30.0–45.0)
TCO2: 25 mmol/L (ref 0–100)

## 2013-08-17 LAB — BASIC METABOLIC PANEL
BUN: 13 mg/dL (ref 6–23)
BUN: 12 mg/dL (ref 6–23)
BUN: 11 mg/dL (ref 6–23)
BUN: 11 mg/dL (ref 6–23)
BUN: 13 mg/dL (ref 6–23)
CALCIUM: 8.8 mg/dL (ref 8.4–10.5)
CALCIUM: 8.9 mg/dL (ref 8.4–10.5)
CALCIUM: 8.5 mg/dL (ref 8.4–10.5)
CO2: 22 mEq/L (ref 19–32)
CO2: 23 mEq/L (ref 19–32)
CO2: 22 mEq/L (ref 19–32)
CO2: 23 mEq/L (ref 19–32)
CO2: 21 meq/L (ref 19–32)
CREATININE: 0.97 mg/dL (ref 0.50–1.10)
CREATININE: 0.9 mg/dL (ref 0.50–1.10)
CREATININE: 0.86 mg/dL (ref 0.50–1.10)
CREATININE: 0.85 mg/dL (ref 0.50–1.10)
Calcium: 9.1 mg/dL (ref 8.4–10.5)
Calcium: 9.3 mg/dL (ref 8.4–10.5)
Chloride: 102 mEq/L (ref 96–112)
Chloride: 102 mEq/L (ref 96–112)
Chloride: 102 mEq/L (ref 96–112)
Chloride: 101 mEq/L (ref 96–112)
Chloride: 100 mEq/L (ref 96–112)
Creatinine, Ser: 0.93 mg/dL (ref 0.50–1.10)
GFR calc Af Amer: 82 mL/min — ABNORMAL LOW (ref >90)
GFR calc Af Amer: 87 mL/min — ABNORMAL LOW (ref >90)
GFR calc Af Amer: 79 mL/min — ABNORMAL LOW (ref >90)
GFR calc non Af Amer: 65 mL/min — ABNORMAL LOW (ref >90)
GFR calc non Af Amer: 71 mL/min — ABNORMAL LOW (ref >90)
GFR calc non Af Amer: 68 mL/min — ABNORMAL LOW (ref >90)
GFR, EST AFRICAN AMERICAN: 75 mL/min — AB (ref >90)
GFR, EST AFRICAN AMERICAN: 88 mL/min — AB (ref >90)
GFR, EST NON AFRICAN AMERICAN: 75 mL/min — AB (ref >90)
GFR, EST NON AFRICAN AMERICAN: 76 mL/min — AB (ref >90)
GLUCOSE: 164 mg/dL — AB (ref 70–99)
Glucose, Bld: 260 mg/dL — ABNORMAL HIGH (ref 70–99)
Glucose, Bld: 150 mg/dL — ABNORMAL HIGH (ref 70–99)
Glucose, Bld: 173 mg/dL — ABNORMAL HIGH (ref 70–99)
Glucose, Bld: 360 mg/dL — ABNORMAL HIGH (ref 70–99)
POTASSIUM: 3.9 meq/L (ref 3.7–5.3)
Potassium: 3.8 mEq/L (ref 3.7–5.3)
Potassium: 4.4 mEq/L (ref 3.7–5.3)
Potassium: 3.8 mEq/L (ref 3.7–5.3)
Potassium: 3.8 mEq/L (ref 3.7–5.3)
SODIUM: 138 meq/L (ref 137–147)
SODIUM: 137 meq/L (ref 137–147)
Sodium: 139 mEq/L (ref 137–147)
Sodium: 137 mEq/L (ref 137–147)
Sodium: 137 mEq/L (ref 137–147)

## 2013-08-17 LAB — CBC
HCT: 37.1 % (ref 36.0–46.0)
HCT: 37.4 % (ref 36.0–46.0)
HEMOGLOBIN: 12.7 g/dL (ref 12.0–15.0)
Hemoglobin: 12.7 g/dL (ref 12.0–15.0)
MCH: 28.9 pg (ref 26.0–34.0)
MCH: 29.1 pg (ref 26.0–34.0)
MCHC: 34.2 g/dL (ref 30.0–36.0)
MCHC: 34 g/dL (ref 30.0–36.0)
MCV: 84.5 fL (ref 78.0–100.0)
MCV: 85.6 fL (ref 78.0–100.0)
PLATELETS: 225 10*3/uL (ref 150–400)
Platelets: 224 10*3/uL (ref 150–400)
RBC: 4.39 MIL/uL (ref 3.87–5.11)
RBC: 4.37 MIL/uL (ref 3.87–5.11)
RDW: 16 % — AB (ref 11.5–15.5)
RDW: 16.3 % — AB (ref 11.5–15.5)
WBC: 6.2 10*3/uL (ref 4.0–10.5)
WBC: 5.8 10*3/uL (ref 4.0–10.5)

## 2013-08-17 LAB — GLUCOSE, CAPILLARY
GLUCOSE-CAPILLARY: 161 mg/dL — AB (ref 70–99)
Glucose-Capillary: 376 mg/dL — ABNORMAL HIGH (ref 70–99)
Glucose-Capillary: 233 mg/dL — ABNORMAL HIGH (ref 70–99)
Glucose-Capillary: 168 mg/dL — ABNORMAL HIGH (ref 70–99)
Glucose-Capillary: 152 mg/dL — ABNORMAL HIGH (ref 70–99)
Glucose-Capillary: 167 mg/dL — ABNORMAL HIGH (ref 70–99)
Glucose-Capillary: 168 mg/dL — ABNORMAL HIGH (ref 70–99)
Glucose-Capillary: 364 mg/dL — ABNORMAL HIGH (ref 70–99)
Glucose-Capillary: 298 mg/dL — ABNORMAL HIGH (ref 70–99)

## 2013-08-17 LAB — RAPID URINE DRUG SCREEN, HOSP PERFORMED
Amphetamines: NOT DETECTED
BENZODIAZEPINES: NOT DETECTED
Barbiturates: NOT DETECTED
Cocaine: POSITIVE — AB
Opiates: NOT DETECTED
Tetrahydrocannabinol: NOT DETECTED

## 2013-08-17 LAB — CBG MONITORING, ED: Glucose-Capillary: 549 mg/dL — ABNORMAL HIGH (ref 70–99)

## 2013-08-17 LAB — TSH: TSH: 78.029 u[IU]/mL — AB (ref 0.350–4.500)

## 2013-08-17 LAB — MAGNESIUM: Magnesium: 1.9 mg/dL (ref 1.5–2.5)

## 2013-08-17 LAB — TROPONIN I
Troponin I: <0.3 ng/mL (ref <0.30)
Troponin I: <0.3 ng/mL (ref <0.30)

## 2013-08-17 LAB — VALPROIC ACID LEVEL

## 2013-08-17 MED ORDER — SODIUM CHLORIDE 0.9 % IV SOLN
INTRAVENOUS | Status: DC
  Administered 2013-08-17: 125 mL/h via INTRAVENOUS

## 2013-08-17 MED ORDER — NICOTINE 14 MG/24HR TD PT24
14.0000 mg | MEDICATED_PATCH | Freq: Every day | TRANSDERMAL | Status: DC
  Administered 2013-08-17 – 2013-08-18 (×2): 14 mg via TRANSDERMAL
  Filled 2013-08-17 (×2): qty 1

## 2013-08-17 MED ORDER — ENOXAPARIN SODIUM 40 MG/0.4ML ~~LOC~~ SOLN
40.0000 mg | SUBCUTANEOUS | Status: DC
  Administered 2013-08-17 – 2013-08-18 (×2): 40 mg via SUBCUTANEOUS
  Filled 2013-08-17 (×2): qty 0.4

## 2013-08-17 MED ORDER — TRAZODONE HCL 100 MG PO TABS
100.0000 mg | ORAL_TABLET | Freq: Every day | ORAL | Status: DC
  Filled 2013-08-17: qty 1

## 2013-08-17 MED ORDER — ALBUTEROL SULFATE (2.5 MG/3ML) 0.083% IN NEBU: 2.5000 mg | INHALATION_SOLUTION | Freq: Four times a day (QID) | RESPIRATORY_TRACT | Status: DC | PRN

## 2013-08-17 MED ORDER — INSULIN ASPART 100 UNIT/ML ~~LOC~~ SOLN
0.0000 [IU] | Freq: Three times a day (TID) | SUBCUTANEOUS | Status: DC
  Administered 2013-08-17: 3 [IU] via SUBCUTANEOUS
  Administered 2013-08-17 – 2013-08-18 (×2): 11 [IU] via SUBCUTANEOUS
  Administered 2013-08-18: 8 [IU] via SUBCUTANEOUS

## 2013-08-17 MED ORDER — DEXTROSE-NACL 5-0.45 % IV SOLN
INTRAVENOUS | Status: DC
  Administered 2013-08-17: 125 mL/h via INTRAVENOUS

## 2013-08-17 MED ORDER — PANTOPRAZOLE SODIUM 40 MG PO TBEC
40.0000 mg | DELAYED_RELEASE_TABLET | Freq: Every day | ORAL | Status: DC
  Administered 2013-08-17 – 2013-08-18 (×2): 40 mg via ORAL
  Filled 2013-08-17: qty 1

## 2013-08-17 MED ORDER — BRIMONIDINE TARTRATE 0.2 % OP SOLN
1.0000 [drp] | Freq: Two times a day (BID) | OPHTHALMIC | Status: DC
  Administered 2013-08-17 – 2013-08-18 (×3): 1 [drp] via OPHTHALMIC
  Filled 2013-08-17 (×2): qty 5

## 2013-08-17 MED ORDER — DEXTROSE-NACL 5-0.45 % IV SOLN: INTRAVENOUS | Status: DC

## 2013-08-17 MED ORDER — GI COCKTAIL ~~LOC~~
30.0000 mL | Freq: Two times a day (BID) | ORAL | Status: DC | PRN
  Filled 2013-08-17: qty 30

## 2013-08-17 MED ORDER — MOMETASONE FURO-FORMOTEROL FUM 100-5 MCG/ACT IN AERO
2.0000 | INHALATION_SPRAY | Freq: Two times a day (BID) | RESPIRATORY_TRACT | Status: DC
  Administered 2013-08-17 – 2013-08-18 (×3): 2 via RESPIRATORY_TRACT
  Filled 2013-08-17: qty 8.8

## 2013-08-17 MED ORDER — DEXTROSE 50 % IV SOLN: 25.0000 mL | INTRAVENOUS | Status: DC | PRN

## 2013-08-17 MED ORDER — OXYCODONE HCL 5 MG PO TABS
10.0000 mg | ORAL_TABLET | ORAL | Status: DC | PRN
  Administered 2013-08-17 – 2013-08-18 (×8): 10 mg via ORAL
  Filled 2013-08-17 (×8): qty 2

## 2013-08-17 MED ORDER — SODIUM CHLORIDE 0.9 % IV SOLN
1000.0000 mL | INTRAVENOUS | Status: DC
  Administered 2013-08-17: 1000 mL via INTRAVENOUS

## 2013-08-17 MED ORDER — ASPIRIN EC 81 MG PO TBEC
81.0000 mg | DELAYED_RELEASE_TABLET | Freq: Every day | ORAL | Status: DC
  Administered 2013-08-17 – 2013-08-18 (×2): 81 mg via ORAL
  Filled 2013-08-17 (×2): qty 1

## 2013-08-17 MED ORDER — ALBUTEROL SULFATE HFA 108 (90 BASE) MCG/ACT IN AERS: 2.0000 | INHALATION_SPRAY | Freq: Four times a day (QID) | RESPIRATORY_TRACT | Status: DC | PRN

## 2013-08-17 MED ORDER — SODIUM CHLORIDE 0.9 % IV SOLN
INTRAVENOUS | Status: DC
  Administered 2013-08-17: 4.9 [IU]/h via INTRAVENOUS
  Filled 2013-08-17: qty 1

## 2013-08-17 MED ORDER — SODIUM CHLORIDE 0.9 % IJ SOLN: 3.0000 mL | Freq: Two times a day (BID) | INTRAMUSCULAR | Status: DC

## 2013-08-17 MED ORDER — SERTRALINE HCL 100 MG PO TABS
100.0000 mg | ORAL_TABLET | Freq: Every day | ORAL | Status: DC
  Administered 2013-08-17 – 2013-08-18 (×2): 100 mg via ORAL
  Filled 2013-08-17 (×2): qty 1

## 2013-08-17 MED ORDER — SODIUM CHLORIDE 0.9 % IV SOLN: 250.0000 mL | INTRAVENOUS | Status: DC | PRN

## 2013-08-17 MED ORDER — SODIUM CHLORIDE 0.9 % IJ SOLN: 3.0000 mL | INTRAMUSCULAR | Status: DC | PRN

## 2013-08-17 MED ORDER — SODIUM CHLORIDE 0.9 % IJ SOLN
3.0000 mL | Freq: Two times a day (BID) | INTRAMUSCULAR | Status: DC
Start: 2013-08-17 — End: 2013-08-17

## 2013-08-17 MED ORDER — INSULIN GLARGINE 100 UNIT/ML ~~LOC~~ SOLN
20.0000 [IU] | Freq: Every day | SUBCUTANEOUS | Status: DC
  Administered 2013-08-17 – 2013-08-18 (×2): 20 [IU] via SUBCUTANEOUS
  Filled 2013-08-17 (×2): qty 0.2

## 2013-08-17 MED ORDER — SODIUM CHLORIDE 0.9 % IV SOLN: INTRAVENOUS | Status: DC

## 2013-08-17 MED ORDER — LATANOPROST 0.005 % OP SOLN
1.0000 [drp] | Freq: Every day | OPHTHALMIC | Status: DC
  Administered 2013-08-17: 1 [drp] via OPHTHALMIC
  Filled 2013-08-17: qty 2.5

## 2013-08-17 MED ORDER — INSULIN ASPART 100 UNIT/ML ~~LOC~~ SOLN
0.0000 [IU] | Freq: Every day | SUBCUTANEOUS | Status: DC
  Administered 2013-08-17: 4 [IU] via SUBCUTANEOUS

## 2013-08-17 MED ORDER — ENOXAPARIN SODIUM 40 MG/0.4ML ~~LOC~~ SOLN: 40.0000 mg | SUBCUTANEOUS | Status: DC

## 2013-08-17 MED ORDER — SODIUM CHLORIDE 0.9 % IV SOLN
INTRAVENOUS | Status: DC
  Filled 2013-08-17: qty 1

## 2013-08-17 MED ORDER — SODIUM CHLORIDE 0.9 % IV BOLUS (SEPSIS): 1000.0000 mL | Freq: Once | INTRAVENOUS | Status: AC

## 2013-08-17 MED ORDER — LEVOTHYROXINE SODIUM 25 MCG PO TABS
25.0000 ug | ORAL_TABLET | Freq: Every day | ORAL | Status: DC
  Administered 2013-08-18: 25 ug via ORAL
  Filled 2013-08-17 (×3): qty 1

## 2013-08-17 MED ORDER — GABAPENTIN 300 MG PO CAPS
300.0000 mg | ORAL_CAPSULE | Freq: Three times a day (TID) | ORAL | Status: DC
  Administered 2013-08-17 – 2013-08-18 (×4): 300 mg via ORAL
  Filled 2013-08-17 (×6): qty 1

## 2013-08-17 MED ORDER — POTASSIUM CHLORIDE 10 MEQ/100ML IV SOLN
10.0000 meq | INTRAVENOUS | Status: AC
  Administered 2013-08-17: 10 meq via INTRAVENOUS
  Filled 2013-08-17 (×2): qty 100

## 2013-08-17 NOTE — Progress Notes (Signed)
Patient is inpatient for treatment of diabetes at the present time. Thank you

## 2013-08-17 NOTE — H&P (Signed)
Date: 08/17/2013               Patient Name:  [name] MRN: [id number]  DOB: 01-10-1956 Age / Sex: 58 y.o., female   PCP: Rebecca Eaton, MD         Medical Service: Internal Medicine Teaching Service         Attending Physician: Dr. Madilyn Fireman, MD    First Contact: Dr. Margart Sickles Pager: [phone]  Second Contact: Dr. Algis Liming Pager: [phone]       After Hours (After 5p/  First Contact Pager: [phone]  weekends / holidays): Second Contact Pager: [phone]   Chief Complaint: Chest pain, hyperglycemia   History of Present Illness: [name] is a 58 y.o. female w/ PMHx of HTN, HLD, DM type II, hypothyroidism, h/o TIA (2012), CKD stage III, COPD, degenerative disk disease, gastritis, schizophrenia, bipolar disorder, and depression, presents to the ED w/ complaints of chest pain and questionable syncope. The patient is a poor historian, but she claims she started having chest pain yesterday while eating a meal. She describes that pain as sharp and constant in nature, 8/10 in severity, not exacerbated or relieved by anything specifically and she claims was associated w/ mild SOB.   The patient also claims that she has chronic dizziness, most commonly in the AM when she wakes up from bed, but can have it at any time. Today she says she was walking and started to feel dizzy and claims she fell to the ground and hit her head on the concrete. She firmly denies that she ever lost consciousness, however she says she does not recall the specifics of the event and does not actually recall falling to the ground. She also admits to some recent associated weakness, fatigue, and nausea as well. She denies poor po intake, recent weight loss, palpitations, tinnitus, fever, chills, abdominal pain, vomiting, or diarrhea.   The patient was admitted in February for similar issues, including mild DKA. She has an extensive history of medication non-compliance and her most recent HbA1c was 15.3 and TSH of  114. She also admits to using cocaine 1 week ago.  On arrival to the ED, patient was noted to have CBG of >600, and serum glucose of 794.   Meds: Current Facility-Administered Medications  Medication Dose Route Frequency Provider Last Rate Last Dose  . 0.9 %  sodium chloride infusion   Intravenous Continuous Na Li, MD      . 0.9 %  sodium chloride infusion  250 mL Intravenous PRN Na Nicoletta Dress, MD      . albuterol (PROVENTIL) (2.5 MG/3ML) 0.083% nebulizer solution 2.5 mg  2.5 mg Inhalation Q6H PRN Na Nicoletta Dress, MD      . aspirin EC tablet 81 mg  81 mg Oral Daily Na Li, MD      . brimonidine (ALPHAGAN) 0.2 % ophthalmic solution 1 drop  1 drop Both Eyes BID Na Li, MD      . dextrose 5 %-0.45 % sodium chloride infusion   Intravenous Continuous Na Li, MD 125 mL/hr at 08/17/13 0250 125 mL/hr at 08/17/13 0250  . dextrose 50 % solution 25 mL  25 mL Intravenous PRN Na Li, MD      . enoxaparin (LOVENOX) injection 40 mg  40 mg Subcutaneous Q24H Na Li, MD      . gabapentin (NEURONTIN) capsule 300 mg  300 mg Oral TID Na Li, MD      . insulin regular (NOVOLIN R,HUMULIN R)  1 Units/mL in sodium chloride 0.9 % 100 mL infusion   Intravenous Continuous Na Li, MD      . latanoprost (XALATAN) 0.005 % ophthalmic solution 1 drop  1 drop Both Eyes QHS Na Li, MD      . levothyroxine (SYNTHROID, LEVOTHROID) tablet 25 mcg  25 mcg Oral QAC breakfast Na Li, MD      . mometasone-formoterol (DULERA) 100-5 MCG/ACT inhaler 2 puff  2 puff Inhalation BID Na Nicoletta Dress, MD      . nicotine (NICODERM CQ - dosed in mg/24 hours) patch 14 mg  14 mg Transdermal Daily Na Li, MD      . oxyCODONE (Oxy IR/ROXICODONE) immediate release tablet 10 mg  10 mg Oral Q4H PRN Na Li, MD      . pantoprazole (PROTONIX) EC tablet 40 mg  40 mg Oral Daily Na Li, MD      . potassium chloride 10 mEq in 100 mL IVPB  10 mEq Intravenous Q1H Na Li, MD      . sertraline (ZOLOFT) tablet 100 mg  100 mg Oral Daily Na Li, MD      . sodium chloride 0.9 % bolus 1,000 mL  1,000 mL  Intravenous Once Na Li, MD      . sodium chloride 0.9 % injection 3 mL  3 mL Intravenous Q12H Na Li, MD      . sodium chloride 0.9 % injection 3 mL  3 mL Intravenous Q12H Na Li, MD      . sodium chloride 0.9 % injection 3 mL  3 mL Intravenous PRN Na Li, MD      . traZODone (DESYREL) tablet 100 mg  100 mg Oral QHS Na Li, MD       Medications Prior to Admission  Medication Sig Dispense Refill  . albuterol (PROVENTIL HFA;VENTOLIN HFA) 108 (90 BASE) MCG/ACT inhaler Inhale 2 puffs into the lungs every 6 (six) hours as needed for wheezing.  1 Inhaler  2  . albuterol (PROVENTIL) (2.5 MG/3ML) 0.083% nebulizer solution Inhale 2.5 mg into the lungs every 6 (six) hours as needed for wheezing.      Marland Kitchen allopurinol (ZYLOPRIM) 300 MG tablet Take 300 mg by mouth daily. For gout.      Marland Kitchen ALPRAZolam (XANAX) 0.5 MG tablet Take 0.5 mg by mouth at bedtime as needed for anxiety.      Marland Kitchen amLODipine (NORVASC) 5 MG tablet Take 5 mg by mouth daily. For hypertension.      Marland Kitchen aspirin EC 81 MG tablet Take 1 tablet (81 mg total) by mouth daily. For platelet aggregation.  30 tablet  0  . atorvastatin (LIPITOR) 80 MG tablet Take 80 mg by mouth every morning.      . brimonidine (ALPHAGAN) 0.2 % ophthalmic solution Place 1 drop into both eyes 2 (two) times daily.  5 mL  12  . colchicine 0.6 MG tablet Take 1 tablet (0.6 mg total) by mouth daily as needed (for gout flare-ups).      . diazepam (VALIUM) 5 MG tablet Take 5 mg by mouth every 6 (six) hours as needed for anxiety.      . divalproex (DEPAKOTE) 500 MG DR tablet Take 1,000 mg by mouth at bedtime.       . Fluticasone-Salmeterol (ADVAIR DISKUS) 250-50 MCG/DOSE AEPB Inhale 1 puff into the lungs 2 times daily at 12 noon and 4 pm.  60 each  0  . gabapentin (NEURONTIN) 100 MG capsule Take 300 mg by mouth  3 (three) times daily. Take 3 (163m) capsules for a dose of (3048m three times each day for neuropathic pain and anxiety.      . Marland Kitchenemfibrozil (LOPID) 600 MG tablet Take 600 mg by  mouth 2 (two) times daily before a meal.      . glucose monitoring kit (FREESTYLE) monitoring kit 1 each by Does not apply route as needed for other.  1 each  1  . insulin aspart protamine- aspart (NOVOLOG MIX 70/30) (70-30) 100 UNIT/ML injection Inject 4 Units into the skin 3 (three) times daily with meals.      . insulin glargine (LANTUS) 100 UNIT/ML injection Inject 26 Units into the skin at bedtime.      . Marland Kitchenatanoprost (XALATAN) 0.005 % ophthalmic solution Place 1 drop into both eyes at bedtime.  2.5 mL  0  . levothyroxine (SYNTHROID, LEVOTHROID) 200 MCG tablet Take 200 mcg by mouth daily before breakfast.      . lisinopril (PRINIVIL,ZESTRIL) 40 MG tablet Take 40 mg by mouth daily.      . mometasone-formoterol (DULERA) 100-5 MCG/ACT AERO Inhale 2 puffs into the lungs 2 (two) times daily.  1 Inhaler    . nicotine (NICODERM CQ - DOSED IN MG/24 HOURS) 14 mg/24hr patch Place 14 mg onto the skin daily.      . nitroGLYCERIN (NITROSTAT) 0.4 MG SL tablet Place 1 tablet (0.4 mg total) under the tongue every 5 (five) minutes as needed for chest pain.  30 tablet  3  . OLANZapine (ZYPREXA) 5 MG tablet Take 5 mg by mouth at bedtime.      . ondansetron (ZOFRAN) 4 MG tablet Take 4 mg by mouth every 8 (eight) hours as needed for nausea or vomiting.      . Oxycodone HCl 10 MG TABS Take 10 mg by mouth every 4 (four) hours as needed (for severepain).      . pantoprazole (PROTONIX) 40 MG tablet Take 1 tablet (40 mg total) by mouth daily. For reflux.  30 tablet  11  . sertraline (ZOLOFT) 100 MG tablet Take 100 mg by mouth daily.      . traZODone (DESYREL) 100 MG tablet Take 1 tablet (100 mg total) by mouth at bedtime.  30 tablet  0   Allergies: Allergies as of 08/16/2013 - Review Complete 08/16/2013  Allergen Reaction Noted  . Hydrocodone-acetaminophen Hives 01/02/2013  . Metformin Diarrhea 01/02/2013   Past Medical History  Diagnosis Date  . Asthma     past 10 years  . Diabetes mellitus     past 5 years    . Glaucoma     dx 06/2011-[facility]  . Hypertension   . Hyperlipidemia   . Hyperthyroidism   . History of TIA (transient ischemic attack)     01/06/2011, x2  . Pancreatitis   . Gout   . Depression   . Renal disorder     chronic kidney disease, stage 3  . Chronic pain   . Glaucoma   . Arthritis     djd -lower back, left knee  . Degenerative disc disease   . Bipolar disorder   . Gastritis   . COPD (chronic obstructive pulmonary disease)   . Schizophrenia   . 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 Fatty liver     mild CT 06/2012  . DKA (diabetic ketoacidoses) 07/21/2013  . Stroke     TIAs  . Esophageal ring, acquired 08/12/2013   Past Surgical History  Procedure  Laterality Date  . Abdominal hysterectomy  1990    still has ovaries   . Colonoscopy     Family History  Problem Relation Age of Onset  . Arthritis Mother   . Diabetes Mother   . Hypertension Mother   . Kidney disease Mother   . Heart disease Mother     died 19  . Heart disease      mother & father  . Hypertension      mother & father  . Colon cancer Neg Hx   . Prostate cancer Neg Hx   . Breast cancer Neg Hx   . Stomach cancer Neg Hx   . Esophageal cancer Neg Hx   . Hypertension Sister   . Hypertension Brother   . Kidney disease Brother    History   Social History  . Marital Status: Single    Spouse Name: N/A    Number of Children: 3  . Years of Education: N/A   Occupational History  . Disability    Social History Main Topics  . Smoking status: Current Some Day Smoker -- 0.02 packs/day for 30 years    Types: Cigarettes  . Smokeless tobacco: Never Used     Comment: smokes 2 cigs daily/uses patches.  . Alcohol Use: No     Comment: denied alcohol at this time  . Drug Use: Yes    Special: Cocaine     Comment: THC  cocaine, pt denies 08/12/13  . Sexual Activity: Yes     Comment: hysterectomy   Other Topics Concern  . Not on file   Social History Narrative   On  disability   Single 3 sons   3 coffee/day   3 regular pepsi/day   Updated 08/11/2013                  Review of Systems: General: Positive for fatigue. Denies fever, chills, diaphoresis, appetite change.  Respiratory: Positive for mild SOB. Denies cough, chest tightness, and wheezing.   Cardiovascular: Positive for chest pain. Denies palpitations.  Gastrointestinal: Positive for nausea. Denies vomiting, abdominal pain, diarrhea, constipation, blood in stool and abdominal distention.  Genitourinary: Denies dysuria, urgency, frequency, hematuria, and flank pain. Endocrine: Denies hot or cold intolerance, polyuria, and polydipsia. Musculoskeletal: Positive for chronic back pain. Denies myalgias, joint swelling, arthralgias and gait problem.  Skin: Denies pallor, rash and wounds.  Neurological: Positive for dizziness, weakness, lightheadedness. Denies seizures, syncope, numbness and headaches.  Psychiatric/Behavioral: Denies mood changes, confusion, nervousness, sleep disturbance and agitation.  Physical Exam: Filed Vitals:   08/17/13 0041 08/17/13 0115 08/17/13 0145 08/17/13 0239  BP: 125/78 136/84 143/79 117/71  Pulse: 83 72 70 63  Temp:    97.5 F (36.4 C)  TempSrc:    Oral  Resp:    15  Height:    5' 2" (1.575 m)  Weight:    182 lb 15.7 oz (83 kg)  SpO2:  93% 95% 97%  General: Vital signs reviewed.  Patient is a well-developed and well-nourished, in no acute distress and cooperative with exam.  Head: Normocephalic and atraumatic. Eyes: PERRL, EOMI, conjunctivae normal, No scleral icterus.  Neck: Supple, trachea midline, normal ROM, No JVD, masses, thyromegaly, or carotid bruit present.  Cardiovascular: RRR, S1 normal, S2 normal, no murmurs, gallops, or rubs. Pulmonary/Chest: Air entry equal bilaterally, no wheezes, rales, or rhonchi. Tenderness to palpation over mid-sternal region.  Abdominal: Soft, mild tenderness in the epigastrium. Non-distended, BS +, no masses,  organomegaly, or guarding  present.  Musculoskeletal: No joint deformities, erythema, or stiffness, ROM full and nontender. Extremities: No swelling or edema,  pulses symmetric and intact bilaterally. No cyanosis or clubbing. Neurological: A&O x3, Strength is normal and symmetric bilaterally, cranial nerve II-XII are grossly intact, no focal motor deficit, sensory intact to light touch bilaterally.  Skin: Warm, dry and intact. No rashes or erythema. Psychiatric: Normal mood and affect. Speech and behavior is normal. Cognition and memory are normal.   Lab results: Basic Metabolic Panel:  Recent Labs  08/16/13 2255  NA 128*  K 4.1  CL 87*  CO2 22  GLUCOSE 794*  BUN 14  CREATININE 1.04  CALCIUM 9.4   Liver Function Tests:  Recent Labs  08/16/13 2255  AST 26  ALT 25  ALKPHOS 171*  BILITOT 0.3  PROT 6.9  ALBUMIN 3.4*   CBC:  Recent Labs  08/16/13 2255  WBC 5.9  HGB 13.6  HCT 39.5  MCV 86.6  PLT 245   Cardiac Enzymes: No results found for this basename: CKTOTAL, CKMB, CKMBINDEX, TROPONINI,  in the last 72 hours CBG:  Recent Labs  08/16/13 2255 08/17/13 0108 08/17/13 0246  GLUCAP >600* 549* 376*   Hemoglobin A1C: No results found for this basename: HGBA1C,  in the last 72 hours Fasting Lipid Panel: No results found for this basename: CHOL, HDL, LDLCALC, TRIG, CHOLHDL, LDLDIRECT,  in the last 72 hours Thyroid Function Tests: No results found for this basename: TSH, T4TOTAL, FREET4, T3FREE, THYROIDAB,  in the last 72 hours  Urine Drug Screen: Drugs of Abuse     Component Value Date/Time   LABOPIA NONE DETECTED 08/16/2013 2309   LABOPIA NEG 01/23/2013 1507   COCAINSCRNUR POSITIVE* 08/16/2013 2309   COCAINSCRNUR NEG 01/23/2013 1507   LABBENZ NONE DETECTED 08/16/2013 2309   LABBENZ NEG 01/23/2013 1507   AMPHETMU NONE DETECTED 08/16/2013 2309   THCU NONE DETECTED 08/16/2013 2309   LABBARB NONE DETECTED 08/16/2013 2309   LABBARB NEG 01/23/2013 1507     Urinalysis:  Recent Labs  08/16/13 2309  COLORURINE YELLOW  LABSPEC 1.033*  PHURINE 5.5  GLUCOSEU >1000*  HGBUR NEGATIVE  BILIRUBINUR NEGATIVE  KETONESUR NEGATIVE  PROTEINUR NEGATIVE  UROBILINOGEN 0.2  NITRITE NEGATIVE  LEUKOCYTESUR NEGATIVE   Imaging results:  Ct Head Wo Contrast  08/17/2013   CLINICAL DATA:  History of trauma from fall with injury to the head.  EXAM: CT HEAD WITHOUT CONTRAST  TECHNIQUE: Contiguous axial images were obtained from the base of the skull through the vertex without intravenous contrast.  COMPARISON:  Head CT 07/20/2013.  FINDINGS: No acute displaced skull fractures are identified. Tiny area of low attenuation in the anterior limb of the right internal capsule, similar to the prior study, compatible with an old lacunar infarction. No acute intracranial abnormality. Specifically, no evidence of acute post-traumatic intracranial hemorrhage, no definite regions of acute/subacute cerebral ischemia, no focal mass, mass effect, hydrocephalus or abnormal intra or extra-axial fluid collections. The visualized paranasal sinuses and mastoids are well pneumatized.  IMPRESSION: 1. No acute displaced skull fractures or acute intracranial abnormalities. 2. 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   Electronically Signed   By: Vinnie Langton M.D.   On: 08/17/2013 00:23   Dg Chest Port 1 View  08/16/2013   CLINICAL DATA:  Chest pain.  EXAM: PORTABLE CHEST - 1 VIEW  COMPARISON:  Chest x-ray 07/20/2013.  FINDINGS: Lung volumes are normal. No consolidative airspace disease.  No pleural effusions. No pneumothorax. No pulmonary nodule or mass noted. Pulmonary vasculature and the cardiomediastinal silhouette are within normal limits. Atherosclerosis in the thoracic aorta.  IMPRESSION: 1.  No radiographic evidence of acute cardiopulmonary disease. 2. Atherosclerosis.   Electronically Signed   By: Vinnie Langton M.D.   On: 08/16/2013  23:46    Other results: EKG: NSR, Prolonged QTc (592). Low voltage.   Assessment & Plan by Problem: Ms. GLADIE GRAVETTE is a 58 y.o. female w/ PMHx of HTN, HLD, DM type II, h/o TIA (2012), CKD stage III, COPD, degenerative disk disease, gastritis, schizophrenia, bipolar disorder, and depression, admitted for chest pain, questionable syncope, and severe hyperglycemia.  DM Type II w/ Severe Hyperglycemia- Patient w/ glucose of 794 on admission, w/ AG of 19, HCO3 of 22, and no ketones in the urine, therefore likely hyperosmolar nonketotic state rather than DKA. She denies any recent abdominal pain. Recently admitted for similar issue in February (discharged 07/25/13). Most recent HbA1c 15.3 (07/20/13). Claims she has been very complaint w/ Lantus 20 units + 70/30 4 units tid, however, after further questioning, she says she has not taken her 70/30 for the past 2 days because "it slipped my mind". Given 1L NS in ED and started on Insulin gtt. Hyperglycemia most likely 2/2 non-compliance.  -Admit to telemetry -Continue Insulin gtt via DKA protocol -Potassium Chloride 10 mEq x2 -NPO for now -Continue IVF; total 2L bolus followed by NS @ 125 ml/hr until CBG <250, then switch to D51/2 NS.  -BMP + magnesium in AM  Chest pain- Chest pain described as sharp in nature, midsternal/epigastric, worsened w/ eating yesterday, not associated w/ exertion and diaphoresis. Patient was seen by Dr. 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 No issues/complications following the procedure. CXR shows NACPD and no free air. EKG reveals no dynamic ST/T wave changes, however is significant for prolonged QTc, a chronic issue for her. Pain is also reproducible. Do not suspect cardiogenic chest pain at this time, most likely 2/2 esophagitis/GERD 2/2 recent stricture dilation vs costochondritis.  -Cycle troponins -Continue ASA  -Protonix 40 mg po qd -Continue home Oxycodone 10 mg po q4h  prn -Repeat EKG in AM  Questionable Syncope- Patient has had issues w/ falls + weakness and dizziness in the past, thought to be multifactorial previously w/ no neurological deficits suggestive of CVA or clinical features consistent w/ BPPV. Very unclear if patient actually had true syncope as she is a poor historian, but claims she had a fall and hit her head on the concrete. CT head performed in ED, shows no acute displaced skull fracture or acute intracranial abnormalities. Also shows old lacunar infarction in the anterior limb of the right internal capsule is again noted. Dizziness and lightheadedness most likely 2/2 volume depletion d/t hyperglycemia/polyuria. Orthostatic vital signs performed in the ED showed change in SBP ~15 mmHg after 1L NS bolus, therefore very likely hypovolemic prior to IVF's.  -Continue IVF as above  HTN- Patient normotensive on admission. Claims she takes Norvasc 5 mg po qd and Lisinopril 40 mg po qd at home.   -Hold HTN medications at this time given normotension w/ likely volume depletion  Hypothyroidism- Most recent TSH 114.58 w/ fT4 of 0.36 (07/20/13). Takes Synthroid 25 mcg at home according to previous discharge summary, however, is still likely non-compliant. Had previously been on a dose as high as 300 mcg previously, but had endorsed poor compliance at that time  as well.  -Continue Synthroid 25 mcg for now -Repeat TSH to assess for medication compliance  Depression/Bipolar/Schizophrenia- Medication regimen very unclear at this time. Says she takes Zyprexa, Depakote, Zoloft, and Trazodone. -Continue Zoloft + Trazodone (documented previously). -Depakote level pending  Prolonged QTc- Previous EKG from 06/2013 as long as 619. 592 on admission.  -Caution w/ QT prolonging medications -Pharmacy consult for medication alert  Medication Non-compliance- Patient has a known history of poor compliance, as noted by her HbA1c as well as TSH (see above). Psychiatric  medications highly in question. -Patient will likely need her medications simplified in outpatient setting in order to aide in compliance.  [facility] consult in AM for medication management on discharge  Substance Abuse- Patient UDS positive for cocaine -Social work consult in AM  CKD- Cr 1.04 on admission, close to baseline. Patient claims she takes Lisinopril, however, not a medication on previous d/c summary and not found w/ her medications. -IVF's  COPD- No issues at this time. -Continue home albuterol + Dulera  Gout- No current issues. Compliance w/ home Allopurinol + Colchicine questionable. -Hold home medications for now  HLD- Patient claims to take Lipitor 80 mg qhs, however compliance also in question -Hold Lipitor -Repeat Lipid panel on outpatient basis  DVT/PE PPx- Lovenox [place]  Dispo: Disposition is deferred at this time, awaiting improvement of current medical problems. Anticipated discharge in approximately 2-3 day(s).   The patient does have a current PCP Rebecca Eaton, MD) and does need an [facility] hospital follow-up appointment after discharge.  The patient does not have transportation limitations that hinder transportation to clinic appointments.  Signed: Corky Sox, MD 08/17/2013, 2:54 AM

## 2013-08-17 NOTE — Progress Notes (Signed)
Subjective: Patient complains of feeling weak and 'dizzy', which has been ongoing for at least several weeks.  She is supposed to be in court today, and is requesting a note of excuse.  She reports that she lives in a home with her fiance, and has an aide who comes 7 days a week; the aide has not previously helped manage the patient's medications, but the patient thinks she might be able to help do so in future.  The patient admits missing doses of lantus recently.  Objective: Vital signs in last 24 hours: Filed Vitals:   08/17/13 0041 08/17/13 0115 08/17/13 0145 08/17/13 0239  BP: 125/78 136/84 143/79 117/71  Pulse: 83 72 70 63  Temp:    97.5 F (36.4 C)  TempSrc:    Oral  Resp:    15  Height:    5\' 2"  (1.575 m)  Weight:    83 kg (182 lb 15.7 oz)  SpO2:  93% 95% 97%   Weight change:  No intake or output data in the 24 hours ending 08/17/13 1053 Physical Exam General: Resting comfortably in no acute distress. Eyes: PERRL, EOMI.  Conjunctivae normal, sclerae clear and anicteric. HENT: Oropharyngeal mucous membranes pink, dry appearing. Pulm: Normal respiratory rate and effort.  CTAB, no wheezes or crackles. Cardio: RRR, S1 and S2 with no murmur, rub, or gallop appreciated. Abdomen: Normoactive bowel sounds. Soft, non-tender, non-distended. Extremities: Warm and well perfused, with DP pulses 2+ bilaterally.  No edema or cyanosis. Neuro: Alert, oriented, and appropriate.  CN II-XII grossly intact.  Lab Results: Basic Metabolic Panel:  Recent Labs Lab 08/17/13 0348  08/17/13 0800 08/17/13 0932  NA 139  < > 138 137  K 3.8  < > 3.8 3.8  CL 102  < > 102 101  CO2 22  < > 22 23  GLUCOSE 260*  < > 164* 173*  BUN 13  < > 11 11  CREATININE 0.97  < > 0.86 0.85  CALCIUM 9.1  < > 8.9 8.5  MG 1.9  --   --   --   < > = values in this interval not displayed. Liver Function Tests:  Recent Labs Lab 08/16/13 2255  AST 26  ALT 25  ALKPHOS 171*  BILITOT 0.3  PROT 6.9  ALBUMIN 3.4*    CBC:  Recent Labs Lab 08/17/13 0348 08/17/13 0539  WBC 6.2 5.8  HGB 12.7 12.7  HCT 37.1 37.4  MCV 84.5 85.6  PLT 225 224   Cardiac Enzymes:  Recent Labs Lab 08/17/13 0348 08/17/13 0932  TROPONINI <0.30 <0.30   CBG:  Recent Labs Lab 08/16/13 2255 08/17/13 0108 08/17/13 0246 08/17/13 0346 08/17/13 0453 08/17/13 0608  GLUCAP >600* 549* 376* 233* 168* 152*   Urine Drug Screen: Drugs of Abuse     Component Value Date/Time   LABOPIA NONE DETECTED 08/16/2013 2309   LABOPIA NEG 01/23/2013 1507   COCAINSCRNUR POSITIVE* 08/16/2013 2309   COCAINSCRNUR NEG 01/23/2013 1507   LABBENZ NONE DETECTED 08/16/2013 2309   LABBENZ NEG 01/23/2013 1507   AMPHETMU NONE DETECTED 08/16/2013 2309   THCU NONE DETECTED 08/16/2013 2309   LABBARB NONE DETECTED 08/16/2013 2309   LABBARB NEG 01/23/2013 1507    Urinalysis:  Recent Labs Lab 08/16/13 2309  COLORURINE YELLOW  LABSPEC 1.033*  PHURINE 5.5  GLUCOSEU >1000*  HGBUR NEGATIVE  BILIRUBINUR NEGATIVE  KETONESUR NEGATIVE  PROTEINUR NEGATIVE  UROBILINOGEN 0.2  NITRITE NEGATIVE  LEUKOCYTESUR NEGATIVE   Misc. Labs: None  Micro Results: No results found for this or any previous visit (from the past 240 hour(s)).  Studies/Results: Ct Head Wo Contrast  08/17/2013   CLINICAL DATA:  History of trauma from fall with injury to the head.  EXAM: CT HEAD WITHOUT CONTRAST  TECHNIQUE: Contiguous axial images were obtained from the base of the skull through the vertex without intravenous contrast.  COMPARISON:  Head CT 07/20/2013.  FINDINGS: No acute displaced skull fractures are identified. Tiny area of low attenuation in the anterior limb of the right internal capsule, similar to the prior study, compatible with an old lacunar infarction. No acute intracranial abnormality. Specifically, no evidence of acute post-traumatic intracranial hemorrhage, no definite regions of acute/subacute cerebral ischemia, no focal mass, mass effect, hydrocephalus or  abnormal intra or extra-axial fluid collections. The visualized paranasal sinuses and mastoids are well pneumatized.  IMPRESSION: 1. No acute displaced skull fractures or acute intracranial abnormalities. 2. 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   Electronically Signed   By: Vinnie Langton M.D.   On: 08/17/2013 00:23   Dg Chest Port 1 View  08/16/2013   CLINICAL DATA:  Chest pain.  EXAM: PORTABLE CHEST - 1 VIEW  COMPARISON:  Chest x-ray 07/20/2013.  FINDINGS: Lung volumes are normal. No consolidative airspace disease. No pleural effusions. No pneumothorax. No pulmonary nodule or mass noted. Pulmonary vasculature and the cardiomediastinal silhouette are within normal limits. Atherosclerosis in the thoracic aorta.  IMPRESSION: 1.  No radiographic evidence of acute cardiopulmonary disease. 2. Atherosclerosis.   Electronically Signed   By: Vinnie Langton M.D.   On: 08/16/2013 23:46   Medications: I have reviewed the patient's current medications. Scheduled Meds: . aspirin EC  81 mg Oral Daily  . brimonidine  1 drop Both Eyes BID  . enoxaparin (LOVENOX) injection  40 mg Subcutaneous Q24H  . gabapentin  300 mg Oral TID  . insulin aspart  0-15 Units Subcutaneous TID WC  . insulin aspart  0-5 Units Subcutaneous QHS  . insulin glargine  20 Units Subcutaneous Daily  . latanoprost  1 drop Both Eyes QHS  . levothyroxine  25 mcg Oral QAC breakfast  . mometasone-formoterol  2 puff Inhalation BID  . nicotine  14 mg Transdermal Daily  . pantoprazole  40 mg Oral Daily  . sertraline  100 mg Oral Daily   Continuous Infusions:  PRN Meds:.albuterol, dextrose, gi cocktail, oxyCODONE Assessment/Plan: Principal Problem:   DKA (diabetic ketoacidoses) Active Problems:   Hypothyroidism   Type II diabetes mellitus with neurological manifestations, uncontrolled   Chronic back pain   HTN (hypertension)   Schizophrenia   Depression with anxiety    Hyperlipidemia   COPD (chronic obstructive pulmonary disease)   Cocaine abuse   Depression, major, recurrent   Prolonged QT interval   Medically noncompliant   Chest pain  # Diabetes, uncontrolled--A1c 15.3 on 2/24, indicating mean plasma glucose in 390s; pt presented with serum glucose of 794 after missing doses of lantus at home.  CBG down to 150s after insulin gtt and IVF overnight.  Transitioned to SSI with basal coverage. -SSI moderate -lantus 20u, first given 8:30am -carb modified diet -consult diabetes coordinator  # Chest pain--patient reports continued chest pain that is positional and reproducible, although setting of recent cocaine use concerning.  EKG without evidence of ACS, first troponin I negative x2 -follow up last troponin -repeat EKG today -continue ASA 81mg  -Protonix -GI cocktail PRN -holding HTN meds at  present as patient normotensive and home regimen unclear  # Hypotension--TSH 114, free T4 .36 on 2/24.  Unclear how much synthroid (if any) patient taking at home.   -repeat TSH -continue levothyroxine 5mcg  # Psych--patient with history of depression and psychosis, home medications unclear - she reports taking depakote but valproic acid level undetectable. -continue zoloft and trazadone, which were documented at last visit -avoiding QT prolonging agents, as patient has elongated QTc -will attempt to simplify medication regimen for improved adherence -consider consulting social work re: cocaine abuse  # H/o COPD--currently asymptomatic -continue dulera -continue albuterol PRN  # Lower back pain--patient complains of ongoing back pain, chronic -continue gabapentin -continue oxycodone 10mg  po q4h PRN  # Glaucoma -continue brimonidine -continue latanoprost  # Dispo--pending transition from insulin gtt to SQ regimen and completed ACS rule out.  Consider [facility] consult to help patient manage medications on an outpatient basis.  This is a Careers information officer  Note.  The care of the patient was discussed with Dr. Algis Liming and the assessment and plan formulated with their assistance.  Please see their attached note for official documentation of the daily encounter.   LOS: 1 day   Cherlyn Labella, Med Student 08/17/2013, 10:53 AM

## 2013-08-17 NOTE — Progress Notes (Signed)
CSW attempted to speak to patient. Patient sleeping and did not respond to Education officer, museum. CSW will try again later or tomorrow.  Jeanette Caprice, MSW, [place]

## 2013-08-17 NOTE — ED Provider Notes (Signed)
CSN: [id number]     Arrival date & time 08/16/13  2239 History   First MD Initiated Contact with Patient 08/16/13 2314     Chief Complaint  Patient presents with  . Hyperglycemia  . Chest Pain     (Consider location/radiation/quality/duration/timing/severity/associated sxs/prior Treatment) Patient is a 58 y.o. female presenting with hyperglycemia and chest pain. The history is provided by the patient.  Hyperglycemia Severity:  Severe Onset quality:  Gradual Timing:  Constant Progression:  Worsening Chronicity:  New Diabetes status:  Controlled with insulin Context: not change in medication   Relieved by:  Nothing Associated symptoms: chest pain and syncope   Associated symptoms: no abdominal pain   Syncope:    Suspicion of head trauma:  Unable to specify Risk factors: obesity   Chest Pain Associated symptoms: syncope   Associated symptoms: no abdominal pain and no palpitations     Past Medical History  Diagnosis Date  . Asthma     past 10 years  . Diabetes mellitus     past 5 years  . Glaucoma     dx 06/2011-[facility]  . Hypertension   . Hyperlipidemia   . Hyperthyroidism   . History of TIA (transient ischemic attack)     01/06/2011, x2  . Pancreatitis   . Gout   . Depression   . Renal disorder     chronic kidney disease, stage 3  . Chronic pain   . Glaucoma   . Arthritis     djd -lower back, left knee  . Degenerative disc disease   . Bipolar disorder   . Gastritis   . COPD (chronic obstructive pulmonary disease)   . Schizophrenia   . 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 Fatty liver     mild CT 06/2012  . DKA (diabetic ketoacidoses) 07/21/2013  . Stroke     TIAs  . Esophageal ring, acquired 08/12/2013   Past Surgical History  Procedure Laterality Date  . Abdominal hysterectomy  1990    still has ovaries   . Colonoscopy     Family History  Problem Relation Age of Onset  . Arthritis Mother   . Diabetes Mother   .  Hypertension Mother   . Kidney disease Mother   . Heart disease Mother     died 48  . Heart disease      mother & father  . Hypertension      mother & father  . Colon cancer Neg Hx   . Prostate cancer Neg Hx   . Breast cancer Neg Hx   . Stomach cancer Neg Hx   . Esophageal cancer Neg Hx   . Hypertension Sister   . Hypertension Brother   . Kidney disease Brother    History  Substance Use Topics  . Smoking status: Current Some Day Smoker -- 0.02 packs/day for 30 years    Types: Cigarettes  . Smokeless tobacco: Never Used     Comment: smokes 2 cigs daily/uses patches.  . Alcohol Use: No     Comment: denied alcohol at this time   OB History   Grav Para Term Preterm Abortions TAB SAB Ect Mult Living                 Review of Systems  Cardiovascular: Positive for chest pain and syncope. Negative for palpitations and leg swelling.  Gastrointestinal: Negative for abdominal pain.  Neurological: Positive for syncope.  All other systems reviewed and are negative.  Allergies  Hydrocodone-acetaminophen and Metformin  Home Medications   Current Outpatient Rx  Name  Route  Sig  Dispense  Refill  . albuterol (PROVENTIL HFA;VENTOLIN HFA) 108 (90 BASE) MCG/ACT inhaler   Inhalation   Inhale 2 puffs into the lungs every 6 (six) hours as needed for wheezing.   1 Inhaler   2   . albuterol (PROVENTIL) (2.5 MG/3ML) 0.083% nebulizer solution   Inhalation   Inhale 2.5 mg into the lungs every 6 (six) hours as needed for wheezing.         Marland Kitchen allopurinol (ZYLOPRIM) 300 MG tablet   Oral   Take 300 mg by mouth daily. For gout.         Marland Kitchen ALPRAZolam (XANAX) 0.5 MG tablet   Oral   Take 0.5 mg by mouth at bedtime as needed for anxiety.         Marland Kitchen amLODipine (NORVASC) 5 MG tablet   Oral   Take 5 mg by mouth daily. For hypertension.         Marland Kitchen aspirin EC 81 MG tablet   Oral   Take 1 tablet (81 mg total) by mouth daily. For platelet aggregation.   30 tablet   0   .  atorvastatin (LIPITOR) 80 MG tablet   Oral   Take 80 mg by mouth every morning.         . brimonidine (ALPHAGAN) 0.2 % ophthalmic solution   Both Eyes   Place 1 drop into both eyes 2 (two) times daily.   5 mL   12   . colchicine 0.6 MG tablet   Oral   Take 1 tablet (0.6 mg total) by mouth daily as needed (for gout flare-ups).         . diazepam (VALIUM) 5 MG tablet   Oral   Take 5 mg by mouth every 6 (six) hours as needed for anxiety.         . divalproex (DEPAKOTE) 500 MG DR tablet   Oral   Take 1,000 mg by mouth at bedtime.          . Fluticasone-Salmeterol (ADVAIR DISKUS) 250-50 MCG/DOSE AEPB   Inhalation   Inhale 1 puff into the lungs 2 times daily at 12 noon and 4 pm.   60 each   0   . gabapentin (NEURONTIN) 100 MG capsule   Oral   Take 300 mg by mouth 3 (three) times daily. Take 3 (138m) capsules for a dose of (3055m three times each day for neuropathic pain and anxiety.         . Marland Kitchenemfibrozil (LOPID) 600 MG tablet   Oral   Take 600 mg by mouth 2 (two) times daily before a meal.         . glucose monitoring kit (FREESTYLE) monitoring kit   Does not apply   1 each by Does not apply route as needed for other.   1 each   1   . insulin aspart protamine- aspart (NOVOLOG MIX 70/30) (70-30) 100 UNIT/ML injection   Subcutaneous   Inject 4 Units into the skin 3 (three) times daily with meals.         . insulin glargine (LANTUS) 100 UNIT/ML injection   Subcutaneous   Inject 26 Units into the skin at bedtime.         . Marland Kitchenatanoprost (XALATAN) 0.005 % ophthalmic solution   Both Eyes   Place 1 drop into both eyes at bedtime.   2.5  mL   0   . levothyroxine (SYNTHROID, LEVOTHROID) 200 MCG tablet   Oral   Take 200 mcg by mouth daily before breakfast.         . lisinopril (PRINIVIL,ZESTRIL) 40 MG tablet   Oral   Take 40 mg by mouth daily.         . mometasone-formoterol (DULERA) 100-5 MCG/ACT AERO   Inhalation   Inhale 2 puffs into the lungs 2  (two) times daily.   1 Inhaler      . nicotine (NICODERM CQ - DOSED IN MG/24 HOURS) 14 mg/24hr patch   Transdermal   Place 14 mg onto the skin daily.         . nitroGLYCERIN (NITROSTAT) 0.4 MG SL tablet   Sublingual   Place 1 tablet (0.4 mg total) under the tongue every 5 (five) minutes as needed for chest pain.   30 tablet   3   . OLANZapine (ZYPREXA) 5 MG tablet   Oral   Take 5 mg by mouth at bedtime.         . ondansetron (ZOFRAN) 4 MG tablet   Oral   Take 4 mg by mouth every 8 (eight) hours as needed for nausea or vomiting.         . Oxycodone HCl 10 MG TABS   Oral   Take 10 mg by mouth every 4 (four) hours as needed (for severepain).         . pantoprazole (PROTONIX) 40 MG tablet   Oral   Take 1 tablet (40 mg total) by mouth daily. For reflux.   30 tablet   11   . sertraline (ZOLOFT) 100 MG tablet   Oral   Take 100 mg by mouth daily.         . traZODone (DESYREL) 100 MG tablet   Oral   Take 1 tablet (100 mg total) by mouth at bedtime.   30 tablet   0    BP 125/70  Pulse 92  Temp(Src) 97.7 F (36.5 C) (Oral)  Resp 20  SpO2 94% Physical Exam  Constitutional: She is oriented to person, place, and time. She appears well-developed and well-nourished. No distress.  HENT:  Head: Normocephalic and atraumatic.  Mouth/Throat: Oropharynx is clear and moist.  Eyes: Conjunctivae are normal. Pupils are equal, round, and reactive to light.  Neck: Normal range of motion. Neck supple.  Cardiovascular: Normal rate, regular rhythm and intact distal pulses.   Pulmonary/Chest: Effort normal and breath sounds normal. She has no wheezes. She has no rales.  Abdominal: Soft. Bowel sounds are normal. There is no tenderness. There is no rebound and no guarding.  Musculoskeletal: Normal range of motion.  Neurological: She is alert and oriented to person, place, and time.  Skin: Skin is warm and dry.  Psychiatric: She has a normal mood and affect.    ED Course   Procedures (including critical care time) Labs Review Labs Reviewed  CBC - Abnormal; Notable for the following:    RDW 16.5 (*)    All other components within normal limits  COMPREHENSIVE METABOLIC PANEL - Abnormal; Notable for the following:    Sodium 128 (*)    Chloride 87 (*)    Glucose, Bld 794 (*)    Albumin 3.4 (*)    Alkaline Phosphatase 171 (*)    GFR calc non Af Amer 59 (*)    GFR calc Af Amer 69 (*)    All other components within normal limits  URINALYSIS, ROUTINE W REFLEX MICROSCOPIC - Abnormal; Notable for the following:    Specific Gravity, Urine 1.033 (*)    Glucose, UA >1000 (*)    All other components within normal limits  CBG MONITORING, ED - Abnormal; Notable for the following:    Glucose-Capillary >600 (*)    All other components within normal limits  URINE MICROSCOPIC-ADD ON  URINE RAPID DRUG SCREEN (HOSP PERFORMED)  I-STAT TROPOININ, ED   Imaging Review Ct Head Wo Contrast  08/17/2013   CLINICAL DATA:  History of trauma from fall with injury to the head.  EXAM: CT HEAD WITHOUT CONTRAST  TECHNIQUE: Contiguous axial images were obtained from the base of the skull through the vertex without intravenous contrast.  COMPARISON:  Head CT 07/20/2013.  FINDINGS: No acute displaced skull fractures are identified. Tiny area of low attenuation in the anterior limb of the right internal capsule, similar to the prior study, compatible with an old lacunar infarction. No acute intracranial abnormality. Specifically, no evidence of acute post-traumatic intracranial hemorrhage, no definite regions of acute/subacute cerebral ischemia, no focal mass, mass effect, hydrocephalus or abnormal intra or extra-axial fluid collections. The visualized paranasal sinuses and mastoids are well pneumatized.  IMPRESSION: 1. No acute displaced skull fractures or acute intracranial abnormalities. 2. 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    Electronically Signed   By: Vinnie Langton M.D.   On: 08/17/2013 00:23   Dg Chest Port 1 View  08/16/2013   CLINICAL DATA:  Chest pain.  EXAM: PORTABLE CHEST - 1 VIEW  COMPARISON:  Chest x-ray 07/20/2013.  FINDINGS: Lung volumes are normal. No consolidative airspace disease. No pleural effusions. No pneumothorax. No pulmonary nodule or mass noted. Pulmonary vasculature and the cardiomediastinal silhouette are within normal limits. Atherosclerosis in the thoracic aorta.  IMPRESSION: 1.  No radiographic evidence of acute cardiopulmonary disease. 2. Atherosclerosis.   Electronically Signed   By: Vinnie Langton M.D.   On: 08/16/2013 23:46     EKG Interpretation   Date/Time:  Sunday August 16 2013 22:47:28 EDT Ventricular Rate:  93 PR Interval:  138 QRS Duration: 62 QT Interval:  476 QTC Calculation: 592 R Axis:   39 Text Interpretation:  Sinus rhythm Ventricular premature complex LAE,  consider biatrial enlargement Low voltage, precordial leads Borderline T  abnormalities, anterior leads Prolonged QT interval Confirmed by  [facility]  MD, Emmaline Kluver ([id number]) on 08/16/2013 11:31:50 PM      MDM   Final diagnoses:  None  MDM Reviewed: previous chart, nursing note and vitals Reviewed previous: ECG and labs Interpretation: labs, ECG, x-ray and CT scan (anion gap 19 No acute chest or head findings) Total time providing critical care: 30-74 minutes. This excludes time spent performing separately reportable procedures and services. Consults: admitting MD  CRITICAL CARE Performed by: Carlisle Beers Total critical care time: 60 minutes Critical care time was exclusive of separately billable procedures and treating other patients. Critical care was necessary to treat or prevent imminent or life-threatening deterioration. Critical care was time spent personally by me on the following activities: development of treatment plan with patient and/or surrogate as well as nursing, discussions  with consultants, evaluation of patient's response to treatment, examination of patient, obtaining history from patient or surrogate, ordering and performing treatments and interventions, ordering and review of laboratory studies, ordering and review of radiographic studies, pulse oximetry and re-evaluation of patient's condition.   Medications  insulin regular (NOVOLIN R,HUMULIN R) 1  Units/mL in sodium chloride 0.9 % 100 mL infusion (not administered)  insulin regular (NOVOLIN R,HUMULIN R) 1 Units/mL in sodium chloride 0.9 % 100 mL infusion (not administered)  0.9 %  sodium chloride infusion (not administered)  dextrose 5 %-0.45 % sodium chloride infusion (not administered)  sodium chloride 0.9 % bolus 1,000 mL (1,000 mLs Intravenous New Bag/Given 08/17/13 0037)        K -Rasch, MD 08/17/13 [phone]

## 2013-08-17 NOTE — Progress Notes (Signed)
CSW received consult for cocaine treatment programs and for underlying psych. CSW notified MD that social worker will see patient for cocaine use and refer outpatient resources. CSW recommended MD order a psych consult if patient has psych issues. Full assessment to follow.  Jeanette Caprice, MSW, [place]

## 2013-08-17 NOTE — Progress Notes (Signed)
  I have seen and examined the patient, and reviewed the daily progress note by Judeth Cornfield, MS 4 and discussed the care of the patient with them. Please see my progress note from 08/17/2013 for further details regarding assessment and plan.    Signed:  Clinton Gallant, MD 08/17/2013, 11:58 AM

## 2013-08-17 NOTE — H&P (Signed)
INTERNAL MEDICINE TEACHING ATTENDING NOTE  Day 1 of stay  Patient name: [name]  MRN: [id number] Date of birth: [birth year]   58 y.o.admitted with severe hyperglycemia, hypovolemia, severely uncontrolled hypothyroidism and chest pain. Other issues - non-compliance, polypharmacy and cocaine use. Has been on insulin drip overnight, with better controlled BS, gap closed, now being overlapped to subq insulin. Patient feels better and has some soreness at chest pain site.   Exam significant for tenderness to palpation over epigastrium, pendulous abdomen, but otherwise no significant findings.   Assessment and Plan  Hyperglycemia - Agree with team in transitioning from IV to SQ insulin. Patient can start diet.  Hypothyroidism - TSH pending. 2/23 value 114 noted. Patient admits taking Synthroid off and on. Agree with starting at 25 mcg for now.   Cocaine use - Counseled heavily against. Education officer, museum consult. Patient agreeable to trying to stop.  Prolonged QTc (in the setting of psych meds) - monitor.  Chest pain - Unlikely cardiac, AMI ruled out. Recent procedure for ring dilation - could be secondary to that, or GERD. GI cocktail to be tried once the patient is able to eat.  Polypharmacy - Medication list to be simplified upon discharge.  Non-compliance - Long discussion with patient his morning. She agrees to get a pill box to orgnanize her medicines.  I have seen and evaluated this patient and discussed it with my IM resident team.  Please see the rest of the plan per resident note from today.   [place], [place] 08/17/2013, 10:16 AM.

## 2013-08-17 NOTE — Progress Notes (Signed)
Inpatient Diabetes Program Recommendations  AACE/ADA: New Consensus Statement on Inpatient Glycemic Control (2013)  Target Ranges:  Prepandial:   less than 140 mg/dL      Peak postprandial:   less than 180 mg/dL (1-2 hours)      Critically ill patients:  140 - 180 mg/dL    Inpatient Diabetes Program Recommendations Insulin - Meal Coverage: consider adding Novolog 3 units TID with meals Diabetes Coordinator will visit pt in the morning to discuss home glycemic management regimen. Thank you  Raoul Pitch BSN, RN,CDE Inpatient Diabetes Coordinator [phone] (team pager)

## 2013-08-17 NOTE — Progress Notes (Signed)
Quick interval progress note  S: pt doing well has had total resolution of CP. Was sleeping comfortably when entered room. Pt is very poor historian and is unsure about what or how she takes her medications. Pt is hungry and feels getting ready to eat.   O:  Filed Vitals:   08/17/13 0239  BP: 117/71  Pulse: 63  Temp: 97.5 F (36.4 C)  Resp: 15   General: resting in bed, NAD HEENT: PERRL, EOMI, no scleral icterus Cardiac: RRR, no rubs, murmurs or gallops Pulm: clear to auscultation bilaterally, moving normal volumes of air Abd: soft, minimal tenderness over epigastrim, nondistended, BS present Ext: warm and well perfused, no pedal edema Neuro: alert and oriented X3, cranial nerves II-XII grossly intact Psych: appropriate affect, responds to questions appropriately   A/P:   # Severely poorly controlled DM II with hyperglycemia: Pt original AG 19, HCO3 22 and Glu 794. UA negative for ketones making DKA less likely. Pt appeared dehydrated on exam and was started on glucomander and had resolution of AG 12 and CBGs in 200s. Pt hgbA1c 15.3 indicating that pt usual sugars in 5-600s. Given pt very disconcerting insulin prescriptions and pt poor medication recall compliance as well as actual insulin usage is definite issue.  -pt was transitioned off insulin gtt 2 hrs after lantus given (10:00 am) -lantus 20 units  -carb mod diet -DM educator consult  -Bmet 1800 to replete K   #Chest pain: very atypical and has resolved this AM. ACS workup negative. UDS + cocaine maybe leading to some coronary spasm. Pt recent endoscopy on 08/12/13 no concerning signs of perforation. GI causes most likely but MSK possible as pain is reproducible. Pt also has very severe hypothyroidism and last TSH 114.583 and free T4 0.36 07/21/13 -cont ASA.  -cont PPI -GI cocktail -synthroid 25 mcg to start -TSH pending  Clinton Gallant, MD  PGY-2 Pgr: [phone]

## 2013-08-18 ENCOUNTER — Encounter: Payer: Self-pay | Admitting: Internal Medicine

## 2013-08-18 LAB — GLUCOSE, CAPILLARY
GLUCOSE-CAPILLARY: 257 mg/dL — AB (ref 70–99)
GLUCOSE-CAPILLARY: 347 mg/dL — AB (ref 70–99)
Glucose-Capillary: 314 mg/dL — ABNORMAL HIGH (ref 70–99)

## 2013-08-18 LAB — BASIC METABOLIC PANEL
BUN: 14 mg/dL (ref 6–23)
CHLORIDE: 101 meq/L (ref 96–112)
CO2: 22 meq/L (ref 19–32)
Calcium: 9.4 mg/dL (ref 8.4–10.5)
Creatinine, Ser: 0.96 mg/dL (ref 0.50–1.10)
GFR calc Af Amer: 76 mL/min — ABNORMAL LOW (ref >90)
GFR calc non Af Amer: 65 mL/min — ABNORMAL LOW (ref >90)
GLUCOSE: 304 mg/dL — AB (ref 70–99)
POTASSIUM: 4.6 meq/L (ref 3.7–5.3)
Sodium: 136 mEq/L — ABNORMAL LOW (ref 137–147)

## 2013-08-18 MED ORDER — LEVOTHYROXINE SODIUM 25 MCG PO TABS: 25.0000 ug | ORAL_TABLET | Freq: Every day | ORAL | Status: DC

## 2013-08-18 MED ORDER — PANTOPRAZOLE SODIUM 40 MG PO TBEC: 40.0000 mg | DELAYED_RELEASE_TABLET | Freq: Every day | ORAL | Status: DC

## 2013-08-18 MED ORDER — INSULIN ASPART 100 UNIT/ML ~~LOC~~ SOLN: 3.0000 [IU] | Freq: Three times a day (TID) | SUBCUTANEOUS | Status: DC

## 2013-08-18 MED ORDER — SERTRALINE HCL 100 MG PO TABS: 100.0000 mg | ORAL_TABLET | Freq: Every day | ORAL | Status: DC

## 2013-08-18 MED ORDER — INSULIN GLARGINE 100 UNIT/ML ~~LOC~~ SOLN: 20.0000 [IU] | Freq: Every day | SUBCUTANEOUS | Status: DC

## 2013-08-18 MED ORDER — OXYCODONE HCL 10 MG PO TABS: 10.0000 mg | ORAL_TABLET | ORAL | Status: DC | PRN

## 2013-08-18 MED ORDER — ALPRAZOLAM 0.5 MG PO TABS: 0.5000 mg | ORAL_TABLET | Freq: Every evening | ORAL | Status: DC | PRN

## 2013-08-18 MED ORDER — INSULIN GLARGINE 100 UNIT/ML ~~LOC~~ SOLN: 26.0000 [IU] | Freq: Every day | SUBCUTANEOUS | Status: DC

## 2013-08-18 NOTE — Significant Event (Signed)
Patient to be discharged to home. Discharge instructions reviewed with patient. Patient verbalized understanding of all contents of AVS. Copy of discharge instruction given to patient. Patient stated her transportation will be here around 3pm. Will continue to monitor patient until d/c home. Lynley Killilea,RN.

## 2013-08-18 NOTE — Progress Notes (Signed)
Subjective: NAEON. Pt is doing well this AM. Continues to be CP free, no SOB, HA, blurry vision. Pt tolerated regular diet. No exacerbation of mood disorder in setting of discontinuing many psychotropic meds.   Objective: Vital signs in last 24 hours: Filed Vitals:   08/18/13 0647 08/18/13 0653 08/18/13 0654 08/18/13 0926  BP: 125/71 120/72 124/69   Pulse: 71     Temp: 97.2 F (36.2 C)     TempSrc: Oral     Resp: 16     Height:      Weight:      SpO2: 97%   97%   Weight change: 2 lb 1.9 oz (0.961 kg)  Intake/Output Summary (Last 24 hours) at 08/18/13 1105 Last data filed at 08/18/13 0900  Gross per 24 hour  Intake   1080 ml  Output      0 ml  Net   1080 ml   Physical Exam General: Resting comfortably in no acute distress. Eyes: PERRL, EOMI.  Conjunctivae normal, sclerae clear and anicteric. HENT: Oropharyngeal mucous membranes pink, MMM Pulm: Normal respiratory rate and effort.  CTAB, no wheezes or crackles. Cardio: RRR, S1 and S2 with no murmur, rub, or gallop appreciated. Abdomen: Normoactive bowel sounds. Soft, non-tender, non-distended. Extremities: Warm and well perfused, with DP pulses 2+ bilaterally.  No edema or cyanosis. Neuro: Alert, oriented, and appropriate.  CN II-XII grossly intact.  Lab Results: Basic Metabolic Panel:  Recent Labs Lab 08/17/13 0348  08/17/13 1845 08/18/13 0605  NA 139  < > 137 136*  K 3.8  < > 3.9 4.6  CL 102  < > 100 101  CO2 22  < > 21 22  GLUCOSE 260*  < > 360* 304*  BUN 13  < > 13 14  CREATININE 0.97  < > 0.93 0.96  CALCIUM 9.1  < > 9.3 9.4  MG 1.9  --   --   --   < > = values in this interval not displayed. Liver Function Tests:  Recent Labs Lab 08/16/13 2255  AST 26  ALT 25  ALKPHOS 171*  BILITOT 0.3  PROT 6.9  ALBUMIN 3.4*   CBC:  Recent Labs Lab 08/17/13 0348 08/17/13 0539  WBC 6.2 5.8  HGB 12.7 12.7  HCT 37.1 37.4  MCV 84.5 85.6  PLT 225 224   Cardiac Enzymes:  Recent Labs Lab 08/17/13 0348  08/17/13 0932  TROPONINI <0.30 <0.30   CBG:  Recent Labs Lab 08/17/13 0834 08/17/13 1107 08/17/13 1619 08/17/13 2117 08/17/13 2257 08/18/13 0649  GLUCAP 167* 168* 364* 298* 314* 257*   Urine Drug Screen: Drugs of Abuse     Component Value Date/Time   LABOPIA NONE DETECTED 08/16/2013 2309   LABOPIA NEG 01/23/2013 1507   COCAINSCRNUR POSITIVE* 08/16/2013 2309   COCAINSCRNUR NEG 01/23/2013 1507   LABBENZ NONE DETECTED 08/16/2013 2309   LABBENZ NEG 01/23/2013 1507   AMPHETMU NONE DETECTED 08/16/2013 2309   THCU NONE DETECTED 08/16/2013 2309   LABBARB NONE DETECTED 08/16/2013 2309   LABBARB NEG 01/23/2013 1507    Urinalysis:  Recent Labs Lab 08/16/13 2309  COLORURINE YELLOW  LABSPEC 1.033*  PHURINE 5.5  GLUCOSEU >1000*  HGBUR NEGATIVE  BILIRUBINUR NEGATIVE  KETONESUR NEGATIVE  PROTEINUR NEGATIVE  UROBILINOGEN 0.2  NITRITE NEGATIVE  LEUKOCYTESUR NEGATIVE   Misc. Labs: None  Micro Results: No results found for this or any previous visit (from the past 240 hour(s)).  Studies/Results: Ct Head Wo Contrast  08/17/2013  CLINICAL DATA:  History of trauma from fall with injury to the head.  EXAM: CT HEAD WITHOUT CONTRAST  TECHNIQUE: Contiguous axial images were obtained from the base of the skull through the vertex without intravenous contrast.  COMPARISON:  Head CT 07/20/2013.  FINDINGS: No acute displaced skull fractures are identified. Tiny area of low attenuation in the anterior limb of the right internal capsule, similar to the prior study, compatible with an old lacunar infarction. No acute intracranial abnormality. Specifically, no evidence of acute post-traumatic intracranial hemorrhage, no definite regions of acute/subacute cerebral ischemia, no focal mass, mass effect, hydrocephalus or abnormal intra or extra-axial fluid collections. The visualized paranasal sinuses and mastoids are well pneumatized.  IMPRESSION: 1. No acute displaced skull fractures or acute intracranial  abnormalities. 2. 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   Electronically Signed   By: Vinnie Langton M.D.   On: 08/17/2013 00:23   Dg Chest Port 1 View  08/16/2013   CLINICAL DATA:  Chest pain.  EXAM: PORTABLE CHEST - 1 VIEW  COMPARISON:  Chest x-ray 07/20/2013.  FINDINGS: Lung volumes are normal. No consolidative airspace disease. No pleural effusions. No pneumothorax. No pulmonary nodule or mass noted. Pulmonary vasculature and the cardiomediastinal silhouette are within normal limits. Atherosclerosis in the thoracic aorta.  IMPRESSION: 1.  No radiographic evidence of acute cardiopulmonary disease. 2. Atherosclerosis.   Electronically Signed   By: Vinnie Langton M.D.   On: 08/16/2013 23:46   Medications: I have reviewed the patient's current medications. Scheduled Meds: . aspirin EC  81 mg Oral Daily  . brimonidine  1 drop Both Eyes BID  . enoxaparin (LOVENOX) injection  40 mg Subcutaneous Q24H  . gabapentin  300 mg Oral TID  . insulin aspart  0-15 Units Subcutaneous TID WC  . insulin aspart  0-5 Units Subcutaneous QHS  . insulin glargine  20 Units Subcutaneous Daily  . latanoprost  1 drop Both Eyes QHS  . levothyroxine  25 mcg Oral QAC breakfast  . mometasone-formoterol  2 puff Inhalation BID  . nicotine  14 mg Transdermal Daily  . pantoprazole  40 mg Oral Daily  . sertraline  100 mg Oral Daily   Continuous Infusions:  PRN Meds:.albuterol, dextrose, gi cocktail, oxyCODONE Assessment/Plan:  # HONK in setting of severely uncontrolled Diabetes--A1c 15.3 on 2/24, indicating mean plasma glucose in 390s; pt presented with serum glucose of 794 after missing doses of lantus at home.  CBG down to 150s after insulin gtt and IVF overnight.  Transitioned to SSI with basal coverage.  -SSI moderate -lantus increased to 26u and will plan to d/c with novolog 3 units TID WC to help simplify management for patient  -carb modified diet -consult  diabetes coordinator  # Chest pain--patient reports continued chest pain that is positional and reproducible, although setting of recent cocaine use concerning.  EKG without evidence of ACS, trops negative x3.  -continue ASA 81mg  -Protonix -GI cocktail PRN -holding HTN meds at present as patient normotensive and home regimen unclear but can restart lisinopril at discharge and follow up outpt  # Hypothyoidism--TSH 114, free T4 .36 on 2/24.  Unclear how much synthroid (if any) patient taking at home.  TSH repeat 78 indicating that pt must be taking some dose of levothyroxine.  -continue levothyroxine 3mcg  # Psych--patient with history of depression and psychosis, home medications unclear - she reports taking depakote but valproic acid level undetectable. -continue zoloft and trazadone,  which were documented at last visit -avoiding QT prolonging agents, as patient has elongated QTc -will attempt to simplify medication regimen for improved adherence -consider consulting social work re: cocaine abuse  # H/o COPD--currently asymptomatic -continue dulera -continue albuterol PRN  # Lower back pain--patient complains of ongoing back pain, chronic -continue gabapentin -continue oxycodone 10mg  po q4h PRN  # Glaucoma -continue brimonidine -continue latanoprost  # Dispo--08/18/2013.   LOS: 2 days   Clinton Gallant, MD 08/18/2013, 11:05 AM

## 2013-08-18 NOTE — Significant Event (Signed)
Patient taken to transportation via wheelchair. All belongings (clothes, medications) taken home with patient. Stachia Slutsky, Therapist, sports.

## 2013-08-18 NOTE — Progress Notes (Signed)
Clinical Social Work Department BRIEF PSYCHOSOCIAL ASSESSMENT 08/18/2013  Patient:  [name], PICONE     Account Number:  [id number]     Admit date:  08/16/2013  Clinical Social Worker:  Megan Salon  Date/Time:  08/18/2013 11:01 AM  Referred by:  Physician  Date Referred:  08/17/2013 Referred for  Substance Abuse   Other Referral:   Interview type:  Patient Other interview type:    PSYCHOSOCIAL DATA Living Status:  SIGNIFICANT OTHER Admitted from facility:   Level of care:   Primary support name:  Johnney Ou Primary support relationship to patient:  FRIEND Degree of support available:   Patient states she has a supportive fiance and family    CURRENT CONCERNS Current Concerns  Substance Abuse   Other Concerns:    SOCIAL WORK ASSESSMENT / PLAN CSW met with patient, introduced self and explained reason for visit. CSW and patient discussed patient's current substance use. Patient states that she is on disability "and gets bored during the day, so her friend comes over and brings stuff to smoke." Patient states, "I know it's bad and I want to stop." CSW provided emotional listening and support. Patient explained that her family is very supportive and encourages her to stop and patient is willing too. Patient explained that she goes to her psychiatrist who also is connected with outpatient substance use resources and states she is going to go there for substance use treatment.   Assessment/plan status:  Information/Referral to [employer] Other assessment/ plan:   Information/referral to community resources:   Substance Use outpatient programs    PATIENT'S/FAMILY'S RESPONSE TO PLAN OF CARE: Patient thanked Education officer, museum for visit and states she is very motivated to change and quit her substance use. Patient states she does it because she is bored and understands it is not good for her health. CSW provided resources and is signing off at this time.        Jeanette Caprice, MSW, [place]

## 2013-08-18 NOTE — Progress Notes (Signed)
Inpatient Diabetes Program Recommendations  AACE/ADA: New Consensus Statement on Inpatient Glycemic Control (2013)  Target Ranges:  Prepandial:   less than 140 mg/dL      Peak postprandial:   less than 180 mg/dL (1-2 hours)      Critically ill patients:  140 - 180 mg/dL   This coordinator spoke with patient concerning home regimen for insulin.  Patient reports taking Lantus and Novolog only.  She is not familiar with 70/30 and does not remember ever taking it.  We went over her discharge insulin orders and she is aware of changes and does not have any questions/concerns about these changes.  She did ask for a glucose meter RX. This coordinator called Dr. Mariea Stable to request RX for meter, lancets and strips.  He will address with Dr. Algis Liming. Patient informed that MD notified for meter RX. Thank you  Raoul Pitch BSN, RN,CDE Inpatient Diabetes Coordinator [phone] (team pager)

## 2013-08-18 NOTE — Progress Notes (Signed)
Internal Medicine Attending  Date: 08/18/2013  Patient name: [name] Medical record number: [id number] Date of birth: [birth year] Age: 58 y.o. Gender: female  I saw and evaluated the patient, and discussed her care on A.M rounds with housestaff.  I reviewed the resident's note by Dr. Algis Liming and I agree with the resident's findings and plans as documented in her note.

## 2013-08-18 NOTE — Progress Notes (Signed)
Subjective: Patient reports feeling much better this morning, with resolution of chest pain and improvement of weakness.  She reports missing a 9am appointment in court this morning.  Objective: Vital signs in last 24 hours: Filed Vitals:   08/18/13 0647 08/18/13 0653 08/18/13 0654 08/18/13 0926  BP: 125/71 120/72 124/69   Pulse: 71     Temp: 97.2 F (36.2 C)     TempSrc: Oral     Resp: 16     Height:      Weight:      SpO2: 97%   97%   Weight change: 0.961 kg (2 lb 1.9 oz)  Intake/Output Summary (Last 24 hours) at 08/18/13 1053 Last data filed at 08/18/13 0900  Gross per 24 hour  Intake   1080 ml  Output      0 ml  Net   1080 ml   Physical Exam General: Resting comfortably in no acute distress. Eyes: PERRL, EOMI.  Conjunctivae normal, sclerae clear and anicteric. HENT: Oropharyngeal mucous membranes pink. Pulm: Normal respiratory rate and effort.  CTAB, no wheezes or crackles. Cardio: RRR, S1 and S2 with no murmur, rub, or gallop appreciated. Abdomen: Normoactive bowel sounds. Soft, non-tender, non-distended. Extremities: Warm and well perfused, with DP pulses 2+ bilaterally.  No edema or cyanosis. Neuro: Alert, oriented, and appropriate.  CN II-XII grossly intact.  Lab Results: Basic Metabolic Panel:  Recent Labs Lab 08/17/13 0348  08/17/13 1845 08/18/13 0605  NA 139  < > 137 136*  K 3.8  < > 3.9 4.6  CL 102  < > 100 101  CO2 22  < > 21 22  GLUCOSE 260*  < > 360* 304*  BUN 13  < > 13 14  CREATININE 0.97  < > 0.93 0.96  CALCIUM 9.1  < > 9.3 9.4  MG 1.9  --   --   --   < > = values in this interval not displayed.  CBC:  Recent Labs Lab 08/17/13 0348 08/17/13 0539  WBC 6.2 5.8  HGB 12.7 12.7  HCT 37.1 37.4  MCV 84.5 85.6  PLT 225 224   Cardiac Enzymes:  Recent Labs Lab 08/17/13 0348 08/17/13 0932  TROPONINI <0.30 <0.30   CBG:  Recent Labs Lab 08/17/13 0834 08/17/13 1107 08/17/13 1619 08/17/13 2117 08/17/13 2257 08/18/13 0649    GLUCAP 167* 168* 364* 298* 314* 257*   Urine Drug Screen: Drugs of Abuse     Component Value Date/Time   LABOPIA NONE DETECTED 08/16/2013 2309   LABOPIA NEG 01/23/2013 1507   COCAINSCRNUR POSITIVE* 08/16/2013 2309   COCAINSCRNUR NEG 01/23/2013 1507   LABBENZ NONE DETECTED 08/16/2013 2309   LABBENZ NEG 01/23/2013 1507   AMPHETMU NONE DETECTED 08/16/2013 2309   THCU NONE DETECTED 08/16/2013 2309   LABBARB NONE DETECTED 08/16/2013 2309   LABBARB NEG 01/23/2013 1507    Misc. Labs: None  Micro Results: None  Studies/Results: Ct Head Wo Contrast  08/17/2013   CLINICAL DATA:  History of trauma from fall with injury to the head.  EXAM: CT HEAD WITHOUT CONTRAST  TECHNIQUE: Contiguous axial images were obtained from the base of the skull through the vertex without intravenous contrast.  COMPARISON:  Head CT 07/20/2013.  FINDINGS: No acute displaced skull fractures are identified. Tiny area of low attenuation in the anterior limb of the right internal capsule, similar to the prior study, compatible with an old lacunar infarction. No acute intracranial abnormality. Specifically, no evidence of acute post-traumatic intracranial hemorrhage,  no definite regions of acute/subacute cerebral ischemia, no focal mass, mass effect, hydrocephalus or abnormal intra or extra-axial fluid collections. The visualized paranasal sinuses and mastoids are well pneumatized.  IMPRESSION: 1. No acute displaced skull fractures or acute intracranial abnormalities. 2. 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   Electronically Signed   By: Vinnie Langton M.D.   On: 08/17/2013 00:23   Dg Chest Port 1 View  08/16/2013   CLINICAL DATA:  Chest pain.  EXAM: PORTABLE CHEST - 1 VIEW  COMPARISON:  Chest x-ray 07/20/2013.  FINDINGS: Lung volumes are normal. No consolidative airspace disease. No pleural effusions. No pneumothorax. No pulmonary nodule or mass noted. Pulmonary vasculature and the  cardiomediastinal silhouette are within normal limits. Atherosclerosis in the thoracic aorta.  IMPRESSION: 1.  No radiographic evidence of acute cardiopulmonary disease. 2. Atherosclerosis.   Electronically Signed   By: Vinnie Langton M.D.   On: 08/16/2013 23:46   Medications: I have reviewed the patient's current medications. Scheduled Meds: . aspirin EC  81 mg Oral Daily  . brimonidine  1 drop Both Eyes BID  . enoxaparin (LOVENOX) injection  40 mg Subcutaneous Q24H  . gabapentin  300 mg Oral TID  . insulin aspart  0-15 Units Subcutaneous TID WC  . insulin aspart  0-5 Units Subcutaneous QHS  . insulin glargine  20 Units Subcutaneous Daily  . latanoprost  1 drop Both Eyes QHS  . levothyroxine  25 mcg Oral QAC breakfast  . mometasone-formoterol  2 puff Inhalation BID  . nicotine  14 mg Transdermal Daily  . pantoprazole  40 mg Oral Daily  . sertraline  100 mg Oral Daily   Continuous Infusions:  PRN Meds:.albuterol, dextrose, gi cocktail, oxyCODONE  Assessment/Plan: Principal Problem:   Hyperosmolar non-ketotic state in patient with type 2 diabetes mellitus Active Problems:   Hypothyroidism   Type II diabetes mellitus with neurological manifestations, uncontrolled   Chronic back pain   HTN (hypertension)   Schizophrenia   Depression with anxiety   Hyperlipidemia   COPD (chronic obstructive pulmonary disease)   Cocaine abuse   Depression, major, recurrent   Prolonged QT interval   Medically noncompliant   Chest pain  # Diabetes, uncontrolled--A1c 15.3 on 2/24, indicating mean plasma glucose in 390s; pt presented with serum glucose of 794 after missing doses of lantus at home.  CBG down to 150s after insulin gtt and IVF overnight.  Transitioned to SSI with basal coverage.  Patient feeling well, with CBG ranging 167 to 364 which is likely her baseline. -continue lantus 20u -carb modified diet -pt will need close outpatient follow up to achieve tighter glucose control, with  improved mealtime coverage  # Chest pain--Although setting of recent cocaine use concerning, chest pain was positional and reproducible, improved with protonix, and is now resolved.  EKG without evidence of ACS, first troponin I negative x3 -counsel re: abstinence from cocaine abuse -continue ASA 81mg  -continue Protonix -GI cocktail PRN -holding HTN meds at present as patient normotensive and home regimen unclear -request social work consult for outpatient cocaine abuse resources  # Hypotension--TSH 78 on 3/23.  Unclear how much synthroid (if any) patient taking at home.   -continue levothyroxine 87mcg  # Psych--patient with history of depression and psychosis, home medications unclear - she reports taking depakote but valproic acid level undetectable.  Psychiatric issues stable. -will d/c zoloft and trazadone in effort to simplify medication regimen -avoiding QT prolonging agents, as  patient has elongated QTc  # H/o COPD--currently asymptomatic -continue dulera -continue albuterol PRN  # Lower back pain--patient complains of ongoing back pain, chronic -continue gabapentin -continue oxycodone 10mg  po q4h PRN  # Glaucoma -continue brimonidine -continue latanoprost  # Dispo--discharge to home today  This is a Careers information officer Note.  The care of the patient was discussed with Dr. Algis Liming and the assessment and plan formulated with their assistance.  Please see their attached note for official documentation of the daily encounter.   LOS: 2 days   [name], Med Student 08/18/2013, 10:53 AM

## 2013-08-18 NOTE — Progress Notes (Signed)
Quick Note:  Office will call - no letter or recall Route info to PCP please ______

## 2013-08-18 NOTE — Discharge Summary (Signed)
Name: [name] MRN: [id number] DOB: January 21, 1956 58 y.o. PCP: Rebecca Eaton, MD  Date of Admission: 08/16/2013 10:39 PM Date of Discharge: 08/18/2013 Attending Physician: Madilyn Fireman, MD  Discharge Diagnosis: 1. Hyperosmolar non-ketotic state in patient with Type 2 DM 2. Atypical CP in setting of cocaine use 3. Severe hypothyroidism 4. Chronic back pain on opioids 5. HTN  6. Depression with Anxiety 7. Cocaine abuse and positive this admission 8. Prolonged QT 2/2 several psychotropic drugs 9. Schizophrenia   Discharge Medications:   Medication List    STOP taking these medications       diazepam 5 MG tablet  Commonly known as:  VALIUM     insulin aspart protamine- aspart (70-30) 100 UNIT/ML injection  Commonly known as:  NOVOLOG MIX 70/30     nicotine 14 mg/24hr patch  Commonly known as:  NICODERM CQ - dosed in mg/24 hours     OLANZapine 5 MG tablet  Commonly known as:  ZYPREXA     ondansetron 4 MG tablet  Commonly known as:  ZOFRAN     traZODone 100 MG tablet  Commonly known as:  DESYREL      TAKE these medications       albuterol 108 (90 BASE) MCG/ACT inhaler  Commonly known as:  PROVENTIL HFA;VENTOLIN HFA  Inhale 2 puffs into the lungs every 6 (six) hours as needed for wheezing.     albuterol (2.5 MG/3ML) 0.083% nebulizer solution  Commonly known as:  PROVENTIL  Inhale 2.5 mg into the lungs every 6 (six) hours as needed for wheezing.     allopurinol 300 MG tablet  Commonly known as:  ZYLOPRIM  Take 300 mg by mouth daily. For gout.     ALPRAZolam 0.5 MG tablet  Commonly known as:  XANAX  Take 1 tablet (0.5 mg total) by mouth at bedtime as needed for anxiety.     amLODipine 5 MG tablet  Commonly known as:  NORVASC  Take 5 mg by mouth daily. For hypertension.     aspirin EC 81 MG tablet  Take 1 tablet (81 mg total) by mouth daily. For platelet aggregation.     atorvastatin 80 MG tablet  Commonly known as:  LIPITOR  Take 80 mg by mouth  every morning.     brimonidine 0.2 % ophthalmic solution  Commonly known as:  ALPHAGAN  Place 1 drop into both eyes 2 (two) times daily.     colchicine 0.6 MG tablet  Take 1 tablet (0.6 mg total) by mouth daily as needed (for gout flare-ups).     divalproex 500 MG DR tablet  Commonly known as:  DEPAKOTE  Take 1,000 mg by mouth at bedtime.     Fluticasone-Salmeterol 250-50 MCG/DOSE Aepb  Commonly known as:  ADVAIR DISKUS  Inhale 1 puff into the lungs 2 times daily at 12 noon and 4 pm.     gabapentin 100 MG capsule  Commonly known as:  NEURONTIN  Take 300 mg by mouth 3 (three) times daily. Take 3 (184m) capsules for a dose of (3021m three times each day for neuropathic pain and anxiety.     gemfibrozil 600 MG tablet  Commonly known as:  LOPID  Take 600 mg by mouth 2 (two) times daily before a meal.     glucose monitoring kit monitoring kit  1 each by Does not apply route as needed for other.     insulin aspart 100 UNIT/ML injection  Commonly known as:  NOVOLOG  Inject 3 Units into the skin 3 (three) times daily with meals.     insulin glargine 100 UNIT/ML injection  Commonly known as:  LANTUS  Inject 0.26 mLs (26 Units total) into the skin at bedtime.     latanoprost 0.005 % ophthalmic solution  Commonly known as:  XALATAN  Place 1 drop into both eyes at bedtime.     levothyroxine 25 MCG tablet  Commonly known as:  SYNTHROID, LEVOTHROID  Take 1 tablet (25 mcg total) by mouth daily before breakfast.     lisinopril 40 MG tablet  Commonly known as:  PRINIVIL,ZESTRIL  Take 40 mg by mouth daily.     mometasone-formoterol 100-5 MCG/ACT Aero  Commonly known as:  DULERA  Inhale 2 puffs into the lungs 2 (two) times daily.     nitroGLYCERIN 0.4 MG SL tablet  Commonly known as:  NITROSTAT  Place 1 tablet (0.4 mg total) under the tongue every 5 (five) minutes as needed for chest pain.     Oxycodone HCl 10 MG Tabs  Take 10 mg by mouth every 4 (four) hours as needed (for  severepain).     Oxycodone HCl 10 MG Tabs  Take 1 tablet (10 mg total) by mouth every 4 (four) hours as needed.     pantoprazole 40 MG tablet  Commonly known as:  PROTONIX  Take 1 tablet (40 mg total) by mouth daily. For reflux.     pantoprazole 40 MG tablet  Commonly known as:  PROTONIX  Take 1 tablet (40 mg total) by mouth daily.     sertraline 100 MG tablet  Commonly known as:  ZOLOFT  Take 1 tablet (100 mg total) by mouth daily.        Disposition and follow-up:   [name] was discharged from [facility] in Stable condition.  At the hospital follow up visit please address:  1.  Medication compliance especially with insulin, setting up [facility] or Home health services to include setting up a pill box, discontinued many psych meds  2.  Labs / imaging needed at time of follow-up: EKG for QTc  3.  Pending labs/ test needing follow-up: none  Follow-up Appointments: Follow-up Information   Follow up with Rebecca Eaton, MD On 08/24/2013. (1:15 pm)    Specialty:  Internal Medicine   Contact information:   [place] [address] [phone]       Follow up with Lillia Corporal, MD On 08/20/2013. (1:00pm you can just walk in and they will add you to his schedule)    Specialty:  Specialist   Contact information:   [address] [place] [address] [phone]       Discharge Instructions:  Future Appointments Provider Department Dept Phone   08/24/2013 1:15 PM Rebecca Eaton, MD [place] [phone]      Consultations:    Procedures Performed:  Dg Chest 2 View  07/20/2013   CLINICAL DATA:  Cough and congestion.  EXAM: CHEST  2 VIEW  COMPARISON:  PA and lateral chest 06/19/2013 and 07/03/2012.  FINDINGS: Mild linear atelectasis or scarring is seen in the left lung base. The lungs are otherwise clear. Heart size is normal. No pneumothorax or pleural effusion.  IMPRESSION: No  acute disease.   Electronically Signed   By: Inge Rise M.D.   On: 07/20/2013 11:16   Dg Cervical Spine Complete  07/20/2013   CLINICAL DATA:  Fall.  Right  lateral neck pain.  EXAM: CERVICAL SPINE  4+ VIEWS  COMPARISON:  Plain film cervical spine 10/29/2012.  FINDINGS: Vertebral body height and alignment are normal. Intervertebral disc space height is maintained. Prevertebral soft tissues are unremarkable. Lung apices are clear.  IMPRESSION: Negative exam.   Electronically Signed   By: Inge Rise M.D.   On: 07/20/2013 11:17   Dg Lumbar Spine Complete  07/20/2013   CLINICAL DATA:  HYPERGLYCEMIA ,FALL  EXAM: LUMBAR SPINE - COMPLETE 4+ VIEW  COMPARISON:  DG LUMBAR SPINE COMPLETE dated 11/07/2012  FINDINGS: There is no evidence of lumbar spine fracture. There is minimal dextroscoliosis of the upper lumbar spine with a mild rotatory component. Intervertebral disc spaces are maintained.  IMPRESSION: No evidence of acute osseous abnormalities.   Electronically Signed   By: Margaree Mackintosh M.D.   On: 07/20/2013 11:17   Ct Head Wo Contrast  08/17/2013   CLINICAL DATA:  History of trauma from fall with injury to the head.  EXAM: CT HEAD WITHOUT CONTRAST  TECHNIQUE: Contiguous axial images were obtained from the base of the skull through the vertex without intravenous contrast.  COMPARISON:  Head CT 07/20/2013.  FINDINGS: No acute displaced skull fractures are identified. Tiny area of low attenuation in the anterior limb of the right internal capsule, similar to the prior study, compatible with an old lacunar infarction. No acute intracranial abnormality. Specifically, no evidence of acute post-traumatic intracranial hemorrhage, no definite regions of acute/subacute cerebral ischemia, no focal mass, mass effect, hydrocephalus or abnormal intra or extra-axial fluid collections. The visualized paranasal sinuses and mastoids are well pneumatized.  IMPRESSION: 1. No acute displaced skull fractures or acute  intracranial abnormalities. 2. 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   Electronically Signed   By: Vinnie Langton M.D.   On: 08/17/2013 00:23   Ct Head Wo Contrast  07/20/2013   CLINICAL DATA:  Status post fall  EXAM: CT HEAD WITHOUT CONTRAST  TECHNIQUE: Contiguous axial images were obtained from the base of the skull through the vertex without intravenous contrast.  COMPARISON:  Previous CT scan of the brain dated February 12, 2012.  FINDINGS: The ventricles are normal in size and position. There is no intracranial hemorrhage nor intracranial mass effect. The cerebellum and brainstem are normal in density. There is no evidence of an evolving ischemic infarction.  At bone window settings the observed portions of the paranasal sinuses and mastoid air cells are clear. There is no evidence of an acute skull fracture.  IMPRESSION: 1. There is no evidence of acute ischemic or hemorrhagic infarction. 2. There is no intracranial mass effect or hydrocephalus. 3. 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   Electronically Signed   By: David  Martinique   On: 07/20/2013 10:35   Dg Chest Port 1 View  08/16/2013   CLINICAL DATA:  Chest pain.  EXAM: PORTABLE CHEST - 1 VIEW  COMPARISON:  Chest x-ray 07/20/2013.  FINDINGS: Lung volumes are normal. No consolidative airspace disease. No pleural effusions. No pneumothorax. No pulmonary nodule or mass noted. Pulmonary vasculature and the cardiomediastinal silhouette are within normal limits. Atherosclerosis in the thoracic aorta.  IMPRESSION: 1.  No radiographic evidence of acute cardiopulmonary disease. 2. Atherosclerosis.   Electronically Signed   By: Vinnie Langton M.D.   On: 08/16/2013 23:46    Admission HPI: [name] is a 58 y.o. female w/  PMHx of HTN, HLD, DM type II, hypothyroidism, h/o TIA (2012), CKD stage III, COPD, degenerative disk disease, gastritis,  schizophrenia, bipolar disorder, and depression, presents to the ED w/ complaints of chest pain and questionable syncope. The patient is a poor historian, but she claims she started having chest pain yesterday while eating a meal. She describes that pain as sharp and constant in nature, 8/10 in severity, not exacerbated or relieved by anything specifically and she claims was associated w/ mild SOB.  The patient also claims that she has chronic dizziness, most commonly in the AM when she wakes up from bed, but can have it at any time. Today she says she was walking and started to feel dizzy and claims she fell to the ground and hit her head on the concrete. She firmly denies that she ever lost consciousness, however she says she does not recall the specifics of the event and does not actually recall falling to the ground. She also admits to some recent associated weakness, fatigue, and nausea as well. She denies poor po intake, recent weight loss, palpitations, tinnitus, fever, chills, abdominal pain, vomiting, or diarrhea.  The patient was admitted in February for similar issues, including mild DKA. She has an extensive history of medication non-compliance and her most recent HbA1c was 15.3 and TSH of 114. She also admits to using cocaine 1 week ago.  On arrival to the ED, patient was noted to have CBG of >600, and serum glucose of 794.   Hospital Course by problem list: # HONK 2/2 Severely poorly controlled DM II: Pt had history of not taking lantus for 2 days prior to admission and presented with original AG 19, HCO3 22 and Glu 794. UA negative for ketones making DKA less likely. Pt appeared dehydrated on exam and was started on glucomander and IVF and had resolution of AG 12 and CBGs in 200s. Pt hgbA1c 15.3 indicating that pt usual sugars in 5-600s. Given pt very disconcerting insulin prescriptions and pt poor medication recall compliance as well as actual insulin usage is definite issue. Pt was  transitioned to Lantus 26 units QHS and novolog 3 units TIDWC at discharge to try for a simpler plan this will need to be titrated at follow up.    #Chest pain: Presentation was very atypical and had resolved spontaneously.  ACS workup negative. UDS + cocaine maybe leading to some coronary spasm. Pt recent endoscopy on 08/12/13 no concerning signs of perforation on PE and CXR was wnl. GI causes most likely etiology but MSK possible as pain is reproducible. Pt also has very severe hypothyroidism and last TSH 114.583 and free T4 0.36 07/21/13 repeat during hospitalization 78. This indicates that pt must be taking some form of thyroid medication. Pt was continued on synthroid 50mg that will need to be followed up in 4-6 wks. There is great concern for primary gain as pt had a court date on 08/17/13 and then was resistant to discharge until court was over. Letters to her lawyer were sent with clear indication that patient would be discharged on 08/18/13 2pm as patient had ride confirmed then.   # Psych--patient with history of depression and psychosis, home medications unclear - she reports taking depakote but valproic acid level undetectable. Psychiatric issues stable. Pt was sent home on zoloft and short course of xanax that can be re-evaluated at follow up. Pt was scheduled appt follow up with dr. PAlphonzo Grieveher psychiatrist given acute changes and pt non-compliance.   #  H/o COPD--Pt was asymptomatic and not in acute exacerbation. Continued home dulera and albuterol PRN   # Lower back pain--patient complains of ongoing back pain, chronic continued home meds of gabapentin and oxycodone 9m po q4h PRN   # Glaucoma: stable continued brimonidine and latanoprost  #cocaine abuse: UDS + cocaine again this admission and CSW provided pt with resources for cessation programs. Extensive counseling was provided.   Discharge Vitals:   BP 124/69  Pulse 71  Temp(Src) 97.2 F (36.2 C) (Oral)  Resp 16  Ht 5' 2"  (1.575  m)  Wt 185 lb 1.6 oz (83.961 kg)  BMI 33.85 kg/m2  SpO2 97%  Discharge Labs:  Results for orders placed during the hospital encounter of 08/16/13 (from the past 24 hour(s))  GLUCOSE, CAPILLARY     Status: Abnormal   Collection Time    08/17/13 11:07 AM      Result Value Ref Range   Glucose-Capillary 168 (*) 70 - 99 mg/dL  GLUCOSE, CAPILLARY     Status: Abnormal   Collection Time    08/17/13  4:19 PM      Result Value Ref Range   Glucose-Capillary 364 (*) 70 - 99 mg/dL  BASIC METABOLIC PANEL     Status: Abnormal   Collection Time    08/17/13  6:45 PM      Result Value Ref Range   Sodium 137  137 - 147 mEq/L   Potassium 3.9  3.7 - 5.3 mEq/L   Chloride 100  96 - 112 mEq/L   CO2 21  19 - 32 mEq/L   Glucose, Bld 360 (*) 70 - 99 mg/dL   BUN 13  6 - 23 mg/dL   Creatinine, Ser 0.93  0.50 - 1.10 mg/dL   Calcium 9.3  8.4 - 10.5 mg/dL   GFR calc non Af Amer 68 (*) >90 mL/min   GFR calc Af Amer 79 (*) >90 mL/min  GLUCOSE, CAPILLARY     Status: Abnormal   Collection Time    08/17/13  9:17 PM      Result Value Ref Range   Glucose-Capillary 298 (*) 70 - 99 mg/dL  GLUCOSE, CAPILLARY     Status: Abnormal   Collection Time    08/17/13 10:57 PM      Result Value Ref Range   Glucose-Capillary 314 (*) 70 - 99 mg/dL   Comment 1 Documented in Chart     Comment 2 Notify RN    BASIC METABOLIC PANEL     Status: Abnormal   Collection Time    08/18/13  6:05 AM      Result Value Ref Range   Sodium 136 (*) 137 - 147 mEq/L   Potassium 4.6  3.7 - 5.3 mEq/L   Chloride 101  96 - 112 mEq/L   CO2 22  19 - 32 mEq/L   Glucose, Bld 304 (*) 70 - 99 mg/dL   BUN 14  6 - 23 mg/dL   Creatinine, Ser 0.96  0.50 - 1.10 mg/dL   Calcium 9.4  8.4 - 10.5 mg/dL   GFR calc non Af Amer 65 (*) >90 mL/min   GFR calc Af Amer 76 (*) >90 mL/min  GLUCOSE, CAPILLARY     Status: Abnormal   Collection Time    08/18/13  6:49 AM      Result Value Ref Range   Glucose-Capillary 257 (*) 70 - 99 mg/dL   Comment 1  Documented in Chart     Comment 2  Notify RN      Signed: Clinton Gallant, MD 08/18/2013, 10:45 AM   Time Spent on Discharge: 35 minutes Services Ordered on Discharge: none Equipment Ordered on Discharge: none

## 2013-08-18 NOTE — Progress Notes (Signed)
  I have seen and examined the patient, and reviewed the daily progress note by Judeth Cornfield, MS 4 and discussed the care of the patient with them. Please see my progress note from 08/18/2013 for further details regarding assessment and plan.    Signed:  Clinton Gallant, MD 08/18/2013, 11:21 AM

## 2013-08-18 NOTE — Progress Notes (Signed)
Quick Note:  Let her know slight stomach inflammation I have no new recommendations - she needs better control of blood sugar to feel better I could see her back BUT relatively pointless until she gets better blood sugar control so would not schedule until she does that unless different problem  ______

## 2013-08-18 NOTE — Care Management Note (Unsigned)
    Page 1 of 1   08/18/2013     3:53:53 PM   CARE MANAGEMENT NOTE 08/18/2013  Patient:  [name], [name]   Account Number:  [id number]  Date Initiated:  08/18/2013  Documentation initiated by:  Kerryn Tennant  Subjective/Objective Assessment:   PT ADM WITH GLUCOSE >700.  PTA, PT RESIDES AT [facility].     Action/Plan:   PT POS FOR COCAINE ON ADMISSION; CSW CONSULTED FOR SUBSTANCE ABUSE COUNSELING/RESOURCES.   Anticipated DC Date:  08/20/2013   Anticipated DC Plan:  HOME/SELF CARE  In-house referral  Clinical Social Worker      DC Planning Services  CM consult      Choice offered to / List presented to:             Status of service:  In process, will continue to follow Medicare Important Message given?   (If response is "NO", the following Medicare IM given date fields will be blank) Date Medicare IM given:   Date Additional Medicare IM given:    Discharge Disposition:    Per UR Regulation:  Reviewed for med. necessity/level of care/duration of stay  If discussed at [place] of Stay Meetings, dates discussed:    Comments:

## 2013-08-20 NOTE — Discharge Summary (Signed)
INTERNAL MEDICINE ATTENDING DISCHARGE COSIGN   I had discussed the discharge plan with my resident team on 08/17/2013. I was not covering the day the patient was discharged. I agree with the discharge documentation and disposition as documented in this note by Dr. Algis Liming.   Madilyn Fireman 08/20/2013, 3:22 PM

## 2013-08-24 ENCOUNTER — Encounter: Payer: Self-pay | Admitting: Internal Medicine

## 2013-08-31 ENCOUNTER — Emergency Department (HOSPITAL_COMMUNITY): Payer: PRIVATE HEALTH INSURANCE

## 2013-08-31 ENCOUNTER — Encounter (HOSPITAL_COMMUNITY): Payer: Self-pay | Admitting: Emergency Medicine

## 2013-08-31 ENCOUNTER — Other Ambulatory Visit: Payer: Self-pay

## 2013-08-31 ENCOUNTER — Emergency Department (HOSPITAL_COMMUNITY)
Admission: EM | Admit: 2013-08-31 | Discharge: 2013-08-31 | Disposition: A | Discharge status: Home / Self Care | Payer: PRIVATE HEALTH INSURANCE | Attending: Emergency Medicine | Admitting: Emergency Medicine

## 2013-08-31 DIAGNOSIS — R109 Unspecified abdominal pain: Secondary | ICD-10-CM | POA: Insufficient documentation

## 2013-08-31 DIAGNOSIS — E785 Hyperlipidemia, unspecified: Secondary | ICD-10-CM | POA: Insufficient documentation

## 2013-08-31 DIAGNOSIS — Z79899 Other long term (current) drug therapy: Secondary | ICD-10-CM | POA: Insufficient documentation

## 2013-08-31 DIAGNOSIS — Z8719 Personal history of other diseases of the digestive system: Secondary | ICD-10-CM | POA: Insufficient documentation

## 2013-08-31 DIAGNOSIS — Z860100 Personal history of colon polyps, unspecified: Secondary | ICD-10-CM | POA: Insufficient documentation

## 2013-08-31 DIAGNOSIS — Z794 Long term (current) use of insulin: Secondary | ICD-10-CM | POA: Insufficient documentation

## 2013-08-31 DIAGNOSIS — Z8659 Personal history of other mental and behavioral disorders: Secondary | ICD-10-CM | POA: Diagnosis not present

## 2013-08-31 DIAGNOSIS — M109 Gout, unspecified: Secondary | ICD-10-CM | POA: Diagnosis not present

## 2013-08-31 DIAGNOSIS — J449 Chronic obstructive pulmonary disease, unspecified: Secondary | ICD-10-CM | POA: Insufficient documentation

## 2013-08-31 DIAGNOSIS — E119 Type 2 diabetes mellitus without complications: Secondary | ICD-10-CM | POA: Insufficient documentation

## 2013-08-31 DIAGNOSIS — H409 Unspecified glaucoma: Secondary | ICD-10-CM | POA: Insufficient documentation

## 2013-08-31 DIAGNOSIS — M1990 Unspecified osteoarthritis, unspecified site: Secondary | ICD-10-CM | POA: Insufficient documentation

## 2013-08-31 DIAGNOSIS — I129 Hypertensive chronic kidney disease with stage 1 through stage 4 chronic kidney disease, or unspecified chronic kidney disease: Secondary | ICD-10-CM | POA: Diagnosis not present

## 2013-08-31 DIAGNOSIS — F319 Bipolar disorder, unspecified: Secondary | ICD-10-CM | POA: Insufficient documentation

## 2013-08-31 DIAGNOSIS — N183 Chronic kidney disease, stage 3 unspecified: Secondary | ICD-10-CM | POA: Diagnosis not present

## 2013-08-31 DIAGNOSIS — IMO0002 Reserved for concepts with insufficient information to code with codable children: Secondary | ICD-10-CM | POA: Diagnosis not present

## 2013-08-31 DIAGNOSIS — R079 Chest pain, unspecified: Secondary | ICD-10-CM | POA: Diagnosis present

## 2013-08-31 DIAGNOSIS — F17200 Nicotine dependence, unspecified, uncomplicated: Secondary | ICD-10-CM | POA: Diagnosis not present

## 2013-08-31 DIAGNOSIS — Z8673 Personal history of transient ischemic attack (TIA), and cerebral infarction without residual deficits: Secondary | ICD-10-CM | POA: Diagnosis not present

## 2013-08-31 DIAGNOSIS — Z7982 Long term (current) use of aspirin: Secondary | ICD-10-CM | POA: Diagnosis not present

## 2013-08-31 DIAGNOSIS — M1710 Unilateral primary osteoarthritis, unspecified knee: Secondary | ICD-10-CM | POA: Insufficient documentation

## 2013-08-31 DIAGNOSIS — G8929 Other chronic pain: Secondary | ICD-10-CM | POA: Insufficient documentation

## 2013-08-31 DIAGNOSIS — E0590 Thyrotoxicosis, unspecified without thyrotoxic crisis or storm: Secondary | ICD-10-CM | POA: Insufficient documentation

## 2013-08-31 DIAGNOSIS — J4489 Other specified chronic obstructive pulmonary disease: Secondary | ICD-10-CM | POA: Insufficient documentation

## 2013-08-31 LAB — CBC
HEMATOCRIT: 41.5 % (ref 36.0–46.0)
HEMOGLOBIN: 14.1 g/dL (ref 12.0–15.0)
MCH: 29.3 pg (ref 26.0–34.0)
MCHC: 34 g/dL (ref 30.0–36.0)
MCV: 86.1 fL (ref 78.0–100.0)
Platelets: 305 10*3/uL (ref 150–400)
RBC: 4.82 MIL/uL (ref 3.87–5.11)
RDW: 15.4 % (ref 11.5–15.5)
WBC: 6.5 10*3/uL (ref 4.0–10.5)

## 2013-08-31 LAB — BASIC METABOLIC PANEL
BUN: 16 mg/dL (ref 6–23)
CO2: 22 mEq/L (ref 19–32)
CREATININE: 0.98 mg/dL (ref 0.50–1.10)
Calcium: 9.2 mg/dL (ref 8.4–10.5)
Chloride: 98 mEq/L (ref 96–112)
GFR, EST AFRICAN AMERICAN: 74 mL/min — AB (ref >90)
GFR, EST NON AFRICAN AMERICAN: 64 mL/min — AB (ref >90)
GLUCOSE: 449 mg/dL — AB (ref 70–99)
Potassium: 4 mEq/L (ref 3.7–5.3)
Sodium: 137 mEq/L (ref 137–147)

## 2013-08-31 LAB — URINALYSIS, ROUTINE W REFLEX MICROSCOPIC
Bilirubin Urine: NEGATIVE
Glucose, UA: >1000 mg/dL — AB
Hgb urine dipstick: NEGATIVE
KETONES UR: NEGATIVE mg/dL
NITRITE: NEGATIVE
PH: 5.5 (ref 5.0–8.0)
Protein, ur: NEGATIVE mg/dL
SPECIFIC GRAVITY, URINE: 1.036 — AB (ref 1.005–1.030)
UROBILINOGEN UA: 0.2 mg/dL (ref 0.0–1.0)

## 2013-08-31 LAB — URINE MICROSCOPIC-ADD ON

## 2013-08-31 LAB — I-STAT TROPONIN, ED: Troponin i, poc: 0.01 ng/mL (ref 0.00–0.08)

## 2013-08-31 LAB — RAPID URINE DRUG SCREEN, HOSP PERFORMED
Amphetamines: NOT DETECTED
BARBITURATES: NOT DETECTED
BENZODIAZEPINES: NOT DETECTED
Cocaine: NOT DETECTED
Opiates: NOT DETECTED
Tetrahydrocannabinol: NOT DETECTED

## 2013-08-31 LAB — CBG MONITORING, ED: Glucose-Capillary: 412 mg/dL — ABNORMAL HIGH (ref 70–99)

## 2013-08-31 MED ORDER — ONDANSETRON HCL 4 MG/2ML IJ SOLN
4.0000 mg | Freq: Once | INTRAMUSCULAR | Status: AC
  Administered 2013-08-31: 4 mg via INTRAVENOUS
  Filled 2013-08-31: qty 2

## 2013-08-31 MED ORDER — ONDANSETRON 8 MG PO TBDP: 8.0000 mg | ORAL_TABLET | Freq: Three times a day (TID) | ORAL | Status: AC | PRN

## 2013-08-31 MED ORDER — KETOROLAC TROMETHAMINE 30 MG/ML IJ SOLN
30.0000 mg | Freq: Once | INTRAMUSCULAR | Status: AC
  Administered 2013-08-31: 30 mg via INTRAVENOUS
  Filled 2013-08-31: qty 1

## 2013-08-31 NOTE — Discharge Instructions (Signed)
Abdominal Pain, Adult °Many things can cause abdominal pain. Usually, abdominal pain is not caused by a disease and will improve without treatment. It can often be observed and treated at home. Your health care provider will do a physical exam and possibly order blood tests and X-rays to help determine the seriousness of your pain. However, in many cases, more time must pass before a clear cause of the pain can be found. Before that point, your health care provider may not know if you need more testing or further treatment. °HOME CARE INSTRUCTIONS  °Monitor your abdominal pain for any changes. The following actions may help to alleviate any discomfort you are experiencing: °· Only take over-the-counter or prescription medicines as directed by your health care provider. °· Do not take laxatives unless directed to do so by your health care provider. °· Try a clear liquid diet (broth, tea, or water) as directed by your health care provider. Slowly move to a bland diet as tolerated. °SEEK MEDICAL CARE IF: °· You have unexplained abdominal pain. °· You have abdominal pain associated with nausea or diarrhea. °· You have pain when you urinate or have a bowel movement. °· You experience abdominal pain that wakes you in the night. °· You have abdominal pain that is worsened or improved by eating food. °· You have abdominal pain that is worsened with eating fatty foods. °SEEK IMMEDIATE MEDICAL CARE IF:  °· Your pain does not go away within 2 hours. °· You have a fever. °· You keep throwing up (vomiting). °· Your pain is felt only in portions of the abdomen, such as the right side or the left lower portion of the abdomen. °· You pass bloody or black tarry stools. °MAKE SURE YOU: °· Understand these instructions.   °· Will watch your condition.   °· Will get help right away if you are not doing well or get worse.   °Document Released: 02/21/2005 Document Revised: 03/04/2013 Document Reviewed: 01/21/2013 °[employer]® Patient  Information ©2014 [employer]. ° °

## 2013-08-31 NOTE — ED Notes (Signed)
MD at bedside. 

## 2013-08-31 NOTE — ED Provider Notes (Signed)
CSN: [id number]     Arrival date & time 08/31/13  1105 History   First MD Initiated Contact with Patient 08/31/13 1122     Chief Complaint  Patient presents with  . Chest Pain  . Hyperglycemia      The history is provided by the patient.   patient reports developing some upper abdominal discomfort last night.  She's had this intermittently before without a clear etiology of the cause of her pain.  She does have a gastroenterologist.  She reports nausea without vomiting.  She denies diarrhea.  No fevers or chills.  No lower abdominal complaints or urinary complaints.  She does report that her blood sugars been elevated the past several days despite taking her medications.  She denies medication noncompliance.  She was denies dietary indiscretion.  Symptoms are mild to moderate in severity  Past Medical History  Diagnosis Date  . Asthma     past 10 years  . Diabetes mellitus     past 5 years  . Glaucoma     dx 06/2011-[facility]  . Hypertension   . Hyperlipidemia   . Hyperthyroidism   . History of TIA (transient ischemic attack)     01/06/2011, x2  . Pancreatitis   . Gout   . Depression   . Renal disorder     chronic kidney disease, stage 3  . Chronic pain   . Glaucoma   . Arthritis     djd -lower back, left knee  . Degenerative disc disease   . Bipolar disorder   . Gastritis   . COPD (chronic obstructive pulmonary disease)   . Schizophrenia   . 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 Fatty liver     mild CT 06/2012  . DKA (diabetic ketoacidoses) 07/21/2013  . Stroke     TIAs  . Esophageal ring, acquired 08/12/2013   Past Surgical History  Procedure Laterality Date  . Abdominal hysterectomy  1990    still has ovaries   . Colonoscopy     Family History  Problem Relation Age of Onset  . Arthritis Mother   . Diabetes Mother   . Hypertension Mother   . Kidney disease Mother   . Heart disease Mother     died 55  . Heart disease      mother &  father  . Hypertension      mother & father  . Colon cancer Neg Hx   . Prostate cancer Neg Hx   . Breast cancer Neg Hx   . Stomach cancer Neg Hx   . Esophageal cancer Neg Hx   . Hypertension Sister   . Hypertension Brother   . Kidney disease Brother    History  Substance Use Topics  . Smoking status: Current Some Day Smoker -- 0.02 packs/day for 30 years    Types: Cigarettes  . Smokeless tobacco: Never Used     Comment: smokes 2 cigs daily/uses patches.  . Alcohol Use: No     Comment: denied alcohol at this time   OB History   Grav Para Term Preterm Abortions TAB SAB Ect Mult Living                 Review of Systems  Cardiovascular: Positive for chest pain.  All other systems reviewed and are negative.      Allergies  Hydrocodone-acetaminophen and Metformin  Home Medications   Current Outpatient Rx  Name  Route  Sig  Dispense  Refill  . albuterol (PROVENTIL HFA;VENTOLIN HFA) 108 (90 BASE) MCG/ACT inhaler   Inhalation   Inhale 2 puffs into the lungs every 6 (six) hours as needed for wheezing.   1 Inhaler   2   . albuterol (PROVENTIL) (2.5 MG/3ML) 0.083% nebulizer solution   Inhalation   Inhale 2.5 mg into the lungs every 6 (six) hours as needed for wheezing.         Marland Kitchen allopurinol (ZYLOPRIM) 300 MG tablet   Oral   Take 300 mg by mouth daily. For gout.         Marland Kitchen ALPRAZolam (XANAX) 0.5 MG tablet   Oral   Take 1 tablet (0.5 mg total) by mouth at bedtime as needed for anxiety.   10 tablet   0   . amLODipine (NORVASC) 5 MG tablet   Oral   Take 5 mg by mouth daily. For hypertension.         Marland Kitchen aspirin EC 81 MG tablet   Oral   Take 1 tablet (81 mg total) by mouth daily. For platelet aggregation.   30 tablet   0   . atorvastatin (LIPITOR) 80 MG tablet   Oral   Take 80 mg by mouth every morning.         . brimonidine (ALPHAGAN) 0.2 % ophthalmic solution   Both Eyes   Place 1 drop into both eyes 2 (two) times daily.   5 mL   12   .  colchicine 0.6 MG tablet   Oral   Take 1 tablet (0.6 mg total) by mouth daily as needed (for gout flare-ups).         . divalproex (DEPAKOTE) 500 MG DR tablet   Oral   Take 1,000 mg by mouth at bedtime.          . Fluticasone-Salmeterol (ADVAIR DISKUS) 250-50 MCG/DOSE AEPB   Inhalation   Inhale 1 puff into the lungs 2 times daily at 12 noon and 4 pm.   60 each   0   . gabapentin (NEURONTIN) 100 MG capsule   Oral   Take 300 mg by mouth 3 (three) times daily. Take 3 (138m) capsules for a dose of (3036m three times each day for neuropathic pain and anxiety.         . Marland Kitchenemfibrozil (LOPID) 600 MG tablet   Oral   Take 600 mg by mouth 2 (two) times daily before a meal.         . glucose monitoring kit (FREESTYLE) monitoring kit   Does not apply   1 each by Does not apply route as needed for other.   1 each   1   . insulin aspart (NOVOLOG) 100 UNIT/ML injection   Subcutaneous   Inject 3 Units into the skin 3 (three) times daily with meals.   10 mL   11   . insulin glargine (LANTUS) 100 UNIT/ML injection   Subcutaneous   Inject 0.26 mLs (26 Units total) into the skin at bedtime.   10 mL   11   . latanoprost (XALATAN) 0.005 % ophthalmic solution   Both Eyes   Place 1 drop into both eyes at bedtime.   2.5 mL   0   . levothyroxine (SYNTHROID, LEVOTHROID) 25 MCG tablet   Oral   Take 1 tablet (25 mcg total) by mouth daily before breakfast.   30 tablet   0   . lisinopril (PRINIVIL,ZESTRIL) 40 MG tablet   Oral   Take  40 mg by mouth daily.         . mometasone-formoterol (DULERA) 100-5 MCG/ACT AERO   Inhalation   Inhale 2 puffs into the lungs 2 (two) times daily.   1 Inhaler      . nitroGLYCERIN (NITROSTAT) 0.4 MG SL tablet   Sublingual   Place 1 tablet (0.4 mg total) under the tongue every 5 (five) minutes as needed for chest pain.   30 tablet   3   . Oxycodone HCl 10 MG TABS   Oral   Take 10 mg by mouth every 4 (four) hours as needed (for  severepain).         . pantoprazole (PROTONIX) 40 MG tablet   Oral   Take 1 tablet (40 mg total) by mouth daily. For reflux.   30 tablet   11   . sertraline (ZOLOFT) 100 MG tablet   Oral   Take 1 tablet (100 mg total) by mouth daily.   30 tablet   0   . ondansetron (ZOFRAN ODT) 8 MG disintegrating tablet   Oral   Take 1 tablet (8 mg total) by mouth every 8 (eight) hours as needed for nausea or vomiting.   12 tablet   0    BP 106/71  Pulse 72  Resp 19  SpO2 96% Physical Exam  Nursing note and vitals reviewed. Constitutional: She is oriented to person, place, and time. She appears well-developed and well-nourished. No distress.  HENT:  Head: Normocephalic and atraumatic.  Eyes: EOM are normal.  Neck: Normal range of motion.  Cardiovascular: Normal rate, regular rhythm and normal heart sounds.   Pulmonary/Chest: Effort normal and breath sounds normal.  Abdominal: Soft. She exhibits no distension. There is no tenderness.  Musculoskeletal: Normal range of motion.  Neurological: She is alert and oriented to person, place, and time.  Skin: Skin is warm and dry.  Psychiatric: She has a normal mood and affect. Judgment normal.    ED Course  Procedures (including critical care time) Labs Review Labs Reviewed  BASIC METABOLIC PANEL - Abnormal; Notable for the following:    Glucose, Bld 449 (*)    GFR calc non Af Amer 64 (*)    GFR calc Af Amer 74 (*)    All other components within normal limits  URINALYSIS, ROUTINE W REFLEX MICROSCOPIC - Abnormal; Notable for the following:    APPearance CLOUDY (*)    Specific Gravity, Urine 1.036 (*)    Glucose, UA >1000 (*)    Leukocytes, UA SMALL (*)    All other components within normal limits  URINE MICROSCOPIC-ADD ON - Abnormal; Notable for the following:    Squamous Epithelial / LPF FEW (*)    Bacteria, UA FEW (*)    All other components within normal limits  CBG MONITORING, ED - Abnormal; Notable for the following:     Glucose-Capillary 412 (*)    All other components within normal limits  CBC  URINE RAPID DRUG SCREEN (HOSP PERFORMED)  HEPATIC FUNCTION PANEL  LIPASE, BLOOD  I-STAT TROPOININ, ED  CBG MONITORING, ED   Imaging Review Dg Chest 2 View  08/31/2013   CLINICAL DATA:  Chest pain, hyperglycemia  EXAM: CHEST  2 VIEW  COMPARISON:  08/16/2013  FINDINGS: Cardiomediastinal silhouette is stable. No acute infiltrate or pulmonary edema. Stable left base scarring. Bony thorax is unremarkable.  IMPRESSION: No active cardiopulmonary disease.   Electronically Signed   By: Lahoma Crocker M.D.   On: 08/31/2013 12:22  EKG Interpretation None      MDM   Final diagnoses:  Abdominal pain    Patient seems to feel much better at this time.  Labs without significant abnormality.  Hyperglycemia noted.  Doubt DKA.  No ketones in urine.  This has urinary tract infection.  Discharge home.  PCP followup.    Hoy Morn, MD 08/31/13 [phone]

## 2013-08-31 NOTE — ED Notes (Signed)
Upper abd pain/cp started yesterday and she thinks her sugar is up has dry mouth and voiding a lot justd/c on 3/24 has not gotten monitor for sugars yet

## 2013-09-01 ENCOUNTER — Encounter: Payer: Self-pay | Admitting: *Deleted

## 2013-09-02 ENCOUNTER — Emergency Department (HOSPITAL_COMMUNITY): Payer: PRIVATE HEALTH INSURANCE

## 2013-09-02 ENCOUNTER — Observation Stay (HOSPITAL_COMMUNITY)
Admission: EM | Admit: 2013-09-02 | Discharge: 2013-09-03 | Disposition: A | Discharge status: Home / Self Care | Payer: PRIVATE HEALTH INSURANCE | Attending: Internal Medicine | Admitting: Internal Medicine

## 2013-09-02 ENCOUNTER — Encounter (HOSPITAL_COMMUNITY): Payer: Self-pay | Admitting: Emergency Medicine

## 2013-09-02 DIAGNOSIS — Z794 Long term (current) use of insulin: Secondary | ICD-10-CM | POA: Insufficient documentation

## 2013-09-02 DIAGNOSIS — F1410 Cocaine abuse, uncomplicated: Secondary | ICD-10-CM

## 2013-09-02 DIAGNOSIS — F17200 Nicotine dependence, unspecified, uncomplicated: Secondary | ICD-10-CM | POA: Insufficient documentation

## 2013-09-02 DIAGNOSIS — R51 Headache: Secondary | ICD-10-CM

## 2013-09-02 DIAGNOSIS — H409 Unspecified glaucoma: Secondary | ICD-10-CM | POA: Insufficient documentation

## 2013-09-02 DIAGNOSIS — F319 Bipolar disorder, unspecified: Secondary | ICD-10-CM | POA: Insufficient documentation

## 2013-09-02 DIAGNOSIS — J4489 Other specified chronic obstructive pulmonary disease: Secondary | ICD-10-CM | POA: Insufficient documentation

## 2013-09-02 DIAGNOSIS — I10 Essential (primary) hypertension: Secondary | ICD-10-CM

## 2013-09-02 DIAGNOSIS — I517 Cardiomegaly: Secondary | ICD-10-CM

## 2013-09-02 DIAGNOSIS — E785 Hyperlipidemia, unspecified: Secondary | ICD-10-CM

## 2013-09-02 DIAGNOSIS — F209 Schizophrenia, unspecified: Secondary | ICD-10-CM

## 2013-09-02 DIAGNOSIS — R2990 Unspecified symptoms and signs involving the nervous system: Secondary | ICD-10-CM

## 2013-09-02 DIAGNOSIS — Z9119 Patient's noncompliance with other medical treatment and regimen: Secondary | ICD-10-CM

## 2013-09-02 DIAGNOSIS — E0590 Thyrotoxicosis, unspecified without thyrotoxic crisis or storm: Secondary | ICD-10-CM | POA: Insufficient documentation

## 2013-09-02 DIAGNOSIS — E039 Hypothyroidism, unspecified: Secondary | ICD-10-CM

## 2013-09-02 DIAGNOSIS — R531 Weakness: Secondary | ICD-10-CM | POA: Diagnosis present

## 2013-09-02 DIAGNOSIS — J45909 Unspecified asthma, uncomplicated: Secondary | ICD-10-CM | POA: Insufficient documentation

## 2013-09-02 DIAGNOSIS — M109 Gout, unspecified: Secondary | ICD-10-CM | POA: Insufficient documentation

## 2013-09-02 DIAGNOSIS — Z8673 Personal history of transient ischemic attack (TIA), and cerebral infarction without residual deficits: Secondary | ICD-10-CM | POA: Insufficient documentation

## 2013-09-02 DIAGNOSIS — Z7982 Long term (current) use of aspirin: Secondary | ICD-10-CM | POA: Insufficient documentation

## 2013-09-02 DIAGNOSIS — Z91199 Patient's noncompliance with other medical treatment and regimen due to unspecified reason: Secondary | ICD-10-CM

## 2013-09-02 DIAGNOSIS — J449 Chronic obstructive pulmonary disease, unspecified: Secondary | ICD-10-CM

## 2013-09-02 DIAGNOSIS — IMO0002 Reserved for concepts with insufficient information to code with codable children: Secondary | ICD-10-CM | POA: Insufficient documentation

## 2013-09-02 DIAGNOSIS — R079 Chest pain, unspecified: Secondary | ICD-10-CM

## 2013-09-02 DIAGNOSIS — G8929 Other chronic pain: Secondary | ICD-10-CM | POA: Insufficient documentation

## 2013-09-02 DIAGNOSIS — R739 Hyperglycemia, unspecified: Secondary | ICD-10-CM

## 2013-09-02 DIAGNOSIS — R29898 Other symptoms and signs involving the musculoskeletal system: Principal | ICD-10-CM

## 2013-09-02 DIAGNOSIS — K7689 Other specified diseases of liver: Secondary | ICD-10-CM | POA: Insufficient documentation

## 2013-09-02 DIAGNOSIS — E119 Type 2 diabetes mellitus without complications: Secondary | ICD-10-CM

## 2013-09-02 DIAGNOSIS — R0789 Other chest pain: Secondary | ICD-10-CM | POA: Insufficient documentation

## 2013-09-02 DIAGNOSIS — E876 Hypokalemia: Secondary | ICD-10-CM

## 2013-09-02 DIAGNOSIS — H538 Other visual disturbances: Secondary | ICD-10-CM | POA: Insufficient documentation

## 2013-09-02 DIAGNOSIS — F411 Generalized anxiety disorder: Secondary | ICD-10-CM | POA: Insufficient documentation

## 2013-09-02 LAB — TROPONIN I: Troponin I: <0.3 ng/mL (ref <0.30)

## 2013-09-02 LAB — URINALYSIS, ROUTINE W REFLEX MICROSCOPIC
BILIRUBIN URINE: NEGATIVE
Glucose, UA: NEGATIVE mg/dL
Hgb urine dipstick: NEGATIVE
KETONES UR: NEGATIVE mg/dL
LEUKOCYTES UA: NEGATIVE
Nitrite: NEGATIVE
PH: 5.5 (ref 5.0–8.0)
Protein, ur: NEGATIVE mg/dL
SPECIFIC GRAVITY, URINE: 1.017 (ref 1.005–1.030)
UROBILINOGEN UA: 0.2 mg/dL (ref 0.0–1.0)

## 2013-09-02 LAB — COMPREHENSIVE METABOLIC PANEL
ALBUMIN: 3.4 g/dL — AB (ref 3.5–5.2)
ALT: 14 U/L (ref 0–35)
AST: 22 U/L (ref 0–37)
Alkaline Phosphatase: 138 U/L — ABNORMAL HIGH (ref 39–117)
BILIRUBIN TOTAL: 0.3 mg/dL (ref 0.3–1.2)
BUN: 13 mg/dL (ref 6–23)
CHLORIDE: 104 meq/L (ref 96–112)
CO2: 24 meq/L (ref 19–32)
Calcium: 9.4 mg/dL (ref 8.4–10.5)
Creatinine, Ser: 1.06 mg/dL (ref 0.50–1.10)
GFR calc Af Amer: 67 mL/min — ABNORMAL LOW (ref >90)
GFR, EST NON AFRICAN AMERICAN: 58 mL/min — AB (ref >90)
Glucose, Bld: 62 mg/dL — ABNORMAL LOW (ref 70–99)
Potassium: 3.3 mEq/L — ABNORMAL LOW (ref 3.7–5.3)
SODIUM: 141 meq/L (ref 137–147)
Total Protein: 6.9 g/dL (ref 6.0–8.3)

## 2013-09-02 LAB — DIFFERENTIAL
BASOS ABS: 0 10*3/uL (ref 0.0–0.1)
BASOS PCT: 0 % (ref 0–1)
Eosinophils Absolute: 0.1 10*3/uL (ref 0.0–0.7)
Eosinophils Relative: 1 % (ref 0–5)
LYMPHS PCT: 31 % (ref 12–46)
Lymphs Abs: 2.3 10*3/uL (ref 0.7–4.0)
Monocytes Absolute: 0.3 10*3/uL (ref 0.1–1.0)
Monocytes Relative: 5 % (ref 3–12)
NEUTROS ABS: 4.8 10*3/uL (ref 1.7–7.7)
NEUTROS PCT: 63 % (ref 43–77)

## 2013-09-02 LAB — RAPID URINE DRUG SCREEN, HOSP PERFORMED
Amphetamines: NOT DETECTED
BENZODIAZEPINES: NOT DETECTED
Barbiturates: NOT DETECTED
COCAINE: POSITIVE — AB
Opiates: NOT DETECTED
Tetrahydrocannabinol: NOT DETECTED

## 2013-09-02 LAB — LIPASE, BLOOD: Lipase: 60 U/L — ABNORMAL HIGH (ref 11–59)

## 2013-09-02 LAB — I-STAT CHEM 8, ED
BUN: 12 mg/dL (ref 6–23)
CREATININE: 1.2 mg/dL — AB (ref 0.50–1.10)
Calcium, Ion: 1.17 mmol/L (ref 1.12–1.23)
Chloride: 106 mEq/L (ref 96–112)
Glucose, Bld: 64 mg/dL — ABNORMAL LOW (ref 70–99)
HCT: 45 % (ref 36.0–46.0)
Hemoglobin: 15.3 g/dL — ABNORMAL HIGH (ref 12.0–15.0)
Potassium: 3.1 mEq/L — ABNORMAL LOW (ref 3.7–5.3)
Sodium: 143 mEq/L (ref 137–147)
TCO2: 23 mmol/L (ref 0–100)

## 2013-09-02 LAB — CBC
HEMATOCRIT: 40 % (ref 36.0–46.0)
Hemoglobin: 13.5 g/dL (ref 12.0–15.0)
MCH: 28.8 pg (ref 26.0–34.0)
MCHC: 33.8 g/dL (ref 30.0–36.0)
MCV: 85.3 fL (ref 78.0–100.0)
Platelets: 296 10*3/uL (ref 150–400)
RBC: 4.69 MIL/uL (ref 3.87–5.11)
RDW: 15.9 % — AB (ref 11.5–15.5)
WBC: 7.6 10*3/uL (ref 4.0–10.5)

## 2013-09-02 LAB — GLUCOSE, CAPILLARY
GLUCOSE-CAPILLARY: 94 mg/dL (ref 70–99)
Glucose-Capillary: 298 mg/dL — ABNORMAL HIGH (ref 70–99)

## 2013-09-02 LAB — I-STAT TROPONIN, ED: Troponin i, poc: 0 ng/mL (ref 0.00–0.08)

## 2013-09-02 LAB — CBG MONITORING, ED
GLUCOSE-CAPILLARY: 163 mg/dL — AB (ref 70–99)
Glucose-Capillary: 154 mg/dL — ABNORMAL HIGH (ref 70–99)
Glucose-Capillary: 42 mg/dL — CL (ref 70–99)
Glucose-Capillary: 119 mg/dL — ABNORMAL HIGH (ref 70–99)

## 2013-09-02 LAB — ETHANOL: Alcohol, Ethyl (B): <11 mg/dL (ref 0–11)

## 2013-09-02 LAB — APTT: aPTT: 34 seconds (ref 24–37)

## 2013-09-02 LAB — PROTIME-INR
INR: 0.98 (ref 0.00–1.49)
PROTHROMBIN TIME: 12.8 s (ref 11.6–15.2)

## 2013-09-02 MED ORDER — ALPRAZOLAM 0.5 MG PO TABS: 0.5000 mg | ORAL_TABLET | Freq: Every evening | ORAL | Status: DC | PRN

## 2013-09-02 MED ORDER — GABAPENTIN 300 MG PO CAPS
300.0000 mg | ORAL_CAPSULE | Freq: Three times a day (TID) | ORAL | Status: DC
  Administered 2013-09-02 – 2013-09-03 (×4): 300 mg via ORAL
  Filled 2013-09-02 (×5): qty 1

## 2013-09-02 MED ORDER — INSULIN ASPART 100 UNIT/ML ~~LOC~~ SOLN: 0.0000 [IU] | SUBCUTANEOUS | Status: DC

## 2013-09-02 MED ORDER — DEXTROSE 50 % IV SOLN
INTRAVENOUS | Status: AC
Start: 2013-09-02 — End: 2013-09-03
  Filled 2013-09-02: qty 50

## 2013-09-02 MED ORDER — INSULIN ASPART 100 UNIT/ML ~~LOC~~ SOLN
0.0000 [IU] | Freq: Three times a day (TID) | SUBCUTANEOUS | Status: DC
  Administered 2013-09-03 (×2): 8 [IU] via SUBCUTANEOUS
  Administered 2013-09-03: 5 [IU] via SUBCUTANEOUS

## 2013-09-02 MED ORDER — HEPARIN SODIUM (PORCINE) 5000 UNIT/ML IJ SOLN
5000.0000 [IU] | Freq: Three times a day (TID) | INTRAMUSCULAR | Status: DC
  Administered 2013-09-02 – 2013-09-03 (×3): 5000 [IU] via SUBCUTANEOUS
  Filled 2013-09-02 (×5): qty 1

## 2013-09-02 MED ORDER — SERTRALINE HCL 100 MG PO TABS
100.0000 mg | ORAL_TABLET | Freq: Every day | ORAL | Status: DC
  Administered 2013-09-02 – 2013-09-03 (×2): 100 mg via ORAL
  Filled 2013-09-02 (×2): qty 1

## 2013-09-02 MED ORDER — DEXTROSE 50 % IV SOLN
50.0000 mL | Freq: Once | INTRAVENOUS | Status: AC
  Administered 2013-09-02: 50 mL via INTRAVENOUS

## 2013-09-02 MED ORDER — SODIUM CHLORIDE 0.9 % IV SOLN
INTRAVENOUS | Status: DC
  Administered 2013-09-02: 17:00:00 via INTRAVENOUS

## 2013-09-02 MED ORDER — DIVALPROEX SODIUM 500 MG PO DR TAB
1000.0000 mg | DELAYED_RELEASE_TABLET | Freq: Every day | ORAL | Status: DC
  Administered 2013-09-02: 1000 mg via ORAL
  Filled 2013-09-02 (×2): qty 2

## 2013-09-02 MED ORDER — PANTOPRAZOLE SODIUM 40 MG PO TBEC
40.0000 mg | DELAYED_RELEASE_TABLET | Freq: Every day | ORAL | Status: DC
  Administered 2013-09-02 – 2013-09-03 (×2): 40 mg via ORAL
  Filled 2013-09-02 (×2): qty 1

## 2013-09-02 MED ORDER — IOHEXOL 350 MG/ML SOLN
100.0000 mL | Freq: Once | INTRAVENOUS | Status: AC | PRN
  Administered 2013-09-02: 100 mL via INTRAVENOUS

## 2013-09-02 MED ORDER — MOMETASONE FURO-FORMOTEROL FUM 100-5 MCG/ACT IN AERO
2.0000 | INHALATION_SPRAY | Freq: Two times a day (BID) | RESPIRATORY_TRACT | Status: DC
  Administered 2013-09-02 – 2013-09-03 (×3): 2 via RESPIRATORY_TRACT
  Filled 2013-09-02: qty 8.8

## 2013-09-02 MED ORDER — SODIUM CHLORIDE 0.9 % IV BOLUS (SEPSIS)
1000.0000 mL | Freq: Once | INTRAVENOUS | Status: AC
  Administered 2013-09-02: 1000 mL via INTRAVENOUS

## 2013-09-02 MED ORDER — ASPIRIN EC 325 MG PO TBEC
325.0000 mg | DELAYED_RELEASE_TABLET | Freq: Every day | ORAL | Status: DC
  Administered 2013-09-02 – 2013-09-03 (×2): 325 mg via ORAL
  Filled 2013-09-02 (×2): qty 1

## 2013-09-02 MED ORDER — GEMFIBROZIL 600 MG PO TABS
600.0000 mg | ORAL_TABLET | Freq: Two times a day (BID) | ORAL | Status: DC
  Administered 2013-09-02 – 2013-09-03 (×3): 600 mg via ORAL
  Filled 2013-09-02 (×4): qty 1

## 2013-09-02 MED ORDER — POTASSIUM CHLORIDE CRYS ER 20 MEQ PO TBCR
40.0000 meq | EXTENDED_RELEASE_TABLET | Freq: Once | ORAL | Status: AC
  Administered 2013-09-02: 40 meq via ORAL
  Filled 2013-09-02: qty 2

## 2013-09-02 MED ORDER — INSULIN GLARGINE 100 UNIT/ML ~~LOC~~ SOLN: 10.0000 [IU] | Freq: Every day | SUBCUTANEOUS | Status: DC

## 2013-09-02 MED ORDER — LATANOPROST 0.005 % OP SOLN
1.0000 [drp] | Freq: Every day | OPHTHALMIC | Status: DC
  Administered 2013-09-02: 1 [drp] via OPHTHALMIC
  Filled 2013-09-02: qty 2.5

## 2013-09-02 MED ORDER — LEVOTHYROXINE SODIUM 25 MCG PO TABS
25.0000 ug | ORAL_TABLET | Freq: Every day | ORAL | Status: DC
  Administered 2013-09-03: 25 ug via ORAL
  Filled 2013-09-02 (×2): qty 1

## 2013-09-02 MED ORDER — OXYCODONE HCL 5 MG PO TABS
10.0000 mg | ORAL_TABLET | ORAL | Status: DC | PRN
  Administered 2013-09-02 – 2013-09-03 (×7): 10 mg via ORAL
  Filled 2013-09-02 (×7): qty 2

## 2013-09-02 MED ORDER — SENNOSIDES-DOCUSATE SODIUM 8.6-50 MG PO TABS: 1.0000 | ORAL_TABLET | Freq: Every evening | ORAL | Status: DC | PRN

## 2013-09-02 MED ORDER — ONDANSETRON HCL 4 MG/2ML IJ SOLN: 4.0000 mg | Freq: Three times a day (TID) | INTRAMUSCULAR | Status: AC | PRN

## 2013-09-02 MED ORDER — BRIMONIDINE TARTRATE 0.2 % OP SOLN
1.0000 [drp] | Freq: Two times a day (BID) | OPHTHALMIC | Status: DC
  Administered 2013-09-02 – 2013-09-03 (×2): 1 [drp] via OPHTHALMIC
  Filled 2013-09-02: qty 5

## 2013-09-02 MED ORDER — ATORVASTATIN CALCIUM 80 MG PO TABS
80.0000 mg | ORAL_TABLET | ORAL | Status: DC
  Administered 2013-09-03: 80 mg via ORAL
  Filled 2013-09-02 (×2): qty 1

## 2013-09-02 MED ORDER — NITROGLYCERIN 0.4 MG SL SUBL: 0.4000 mg | SUBLINGUAL_TABLET | SUBLINGUAL | Status: DC | PRN

## 2013-09-02 MED ORDER — ALBUTEROL SULFATE (2.5 MG/3ML) 0.083% IN NEBU: 2.5000 mg | INHALATION_SOLUTION | Freq: Four times a day (QID) | RESPIRATORY_TRACT | Status: DC | PRN

## 2013-09-02 NOTE — Progress Notes (Addendum)
Hypoglycemic Event  CBG: 65 Treatment: 15g carb snack  Symptoms: hunger  Follow-up CBG: Time:1700 CBG Result 94  Possible Reasons for Event: Patient had been NPO in ED  Comments/MD notified: Chikowski    Esperanza Sheets  Remember to initiate Hypoglycemia Order Set & complete

## 2013-09-02 NOTE — ED Notes (Signed)
Per EMS: pt c/o of weakness and and dizziness, states last time she felt like this she was hyperglycemic. CBG 195.

## 2013-09-02 NOTE — Code Documentation (Signed)
Pt first seen today at [facility] with ? Stroke sx; code stroke was called at 1143, but transfer delayed after decision to get CT/CTA prior to transfer.  Pt arrived at [facility] at [place]. She was awake and alert,c/o being cold. Warm blankets applied.  Her NIHSS is 4, scoring 1 point each  for drift of BLEs, sensory deficit on R (splits the midline), and ? LUQ visual field cut.  She is clearly outside the treatment window for tPA, however her LKW time changes with each interrogation. She also admits to using cocaine 2 evenings ago. Plan is to admit for stroke w/u. Handoff done with ED RN.

## 2013-09-02 NOTE — H&P (Signed)
Date: 09/02/2013               Patient Name:  [name] MRN: [id number]  DOB: 1955-09-29 Age / Sex: 58 y.o., female   PCP: Rebecca Eaton, MD         Medical Service: Internal Medicine Teaching Service         Attending Physician: Dr. Axel Filler, MD    First Contact: Dr. Mechele Claude Pager: [phone]  Second Contact: Dr. Eulas Post Pager: [phone]       After Hours (After 5p/  First Contact Pager: [phone]  weekends / holidays): Second Contact Pager: [phone]   Chief Complaint: R sided weakness  History of Present Illness:  This is a 57yo AAF with PMH hypothyroidism, T2DM, HTN, bipolar d/o and schizophrenia, cocaine abuse, HLD, depression/anxiety, HLD, COPD, dysphagia w/ known schatzki's ring s/p dilation in 07/2012 who presents with c/o generalized weakness that progressed to R sided weakness (both arm and leg). Patient notes that this morning when she woke up she felt fatigued and as though her whole body was weak. At around 10am she began having R leg tingling/weakness. She went to the bank and in the bank became lightheaded, no LOC and no falls. She did experience some sharp epigastric/lower chest pain that radiates into her back during this episode. There was some mild SOB and nausea, no vomiting or diaphoresis. Patient notes having intermittent episodes of this same chest pain that she thinks is related to her esophageal ring. The pain is worsened by deep breathing. She denies having history of DOE, but does endorse having this same chest pain with both exertion and rest. The pain is not always associated with nausea, SOB. She does have food that gets stuck in her esophagus, especially solids. This is not much improved since her esophageal dilation by GI in March (Dr. Carlean Purl). Patient endorses having a cough productive of white sputum x 2 weeks, no other URI type symptoms. She does some 3 cigarettes per day with long smoking hx. Of note, during my exam patient having some chest pain (same  quality as prior) and feeling as though her R arm and leg weaker than L side. Denies dysuria, abd pain. No recent travel or surgeries, no hx cancer, not on any HRT.   In the ED, VSS. She was hypokalemic to 3.3. CBC and coags wnl. Trop negative x 1. Head and neck CTA no acute process. UDS + cocaine (pt admits to using cocaine 2 nights ago). ETOH <11. UA negative. Patient's CBG 154 but dropped to 42 around noon. She was given an amp of D50 with repeat CBG 119. Patient reports complaince with home lantus 26U qHS and Novolog 3u with meals, did not take any more insulin than prescribed today and has eaten normally today.  Once patient arrived on floor, CBG 65 so patient given carbohydrate load orally.   Meds: Medications Prior to Admission  Medication Sig Dispense Refill  . albuterol (PROVENTIL HFA;VENTOLIN HFA) 108 (90 BASE) MCG/ACT inhaler Inhale 2 puffs into the lungs every 6 (six) hours as needed for wheezing.  1 Inhaler  2  . albuterol (PROVENTIL) (2.5 MG/3ML) 0.083% nebulizer solution Inhale 2.5 mg into the lungs every 6 (six) hours as needed for wheezing.      Marland Kitchen allopurinol (ZYLOPRIM) 300 MG tablet Take 300 mg by mouth daily. For gout.      Marland Kitchen ALPRAZolam (XANAX) 0.5 MG tablet Take 1 tablet (0.5 mg total) by mouth at bedtime as needed  for anxiety.  10 tablet  0  . amLODipine (NORVASC) 5 MG tablet Take 5 mg by mouth daily. For hypertension.      Marland Kitchen aspirin EC 81 MG tablet Take 1 tablet (81 mg total) by mouth daily. For platelet aggregation.  30 tablet  0  . atorvastatin (LIPITOR) 80 MG tablet Take 80 mg by mouth every morning.      . brimonidine (ALPHAGAN) 0.2 % ophthalmic solution Place 1 drop into both eyes 2 (two) times daily.  5 mL  12  . colchicine 0.6 MG tablet Take 1 tablet (0.6 mg total) by mouth daily as needed (for gout flare-ups).      . divalproex (DEPAKOTE) 500 MG DR tablet Take 1,000 mg by mouth at bedtime.       . Fluticasone-Salmeterol (ADVAIR DISKUS) 250-50 MCG/DOSE AEPB Inhale 1 puff  into the lungs 2 times daily at 12 noon and 4 pm.  60 each  0  . gabapentin (NEURONTIN) 100 MG capsule Take 300 mg by mouth 3 (three) times daily. Take 3 (100mg ) capsules for a dose of (300mg ) three times each day for neuropathic pain and anxiety.      Marland Kitchen gemfibrozil (LOPID) 600 MG tablet Take 600 mg by mouth 2 (two) times daily before a meal.      . insulin aspart (NOVOLOG) 100 UNIT/ML injection Inject 3 Units into the skin 3 (three) times daily with meals.  10 mL  11  . insulin glargine (LANTUS) 100 UNIT/ML injection Inject 0.26 mLs (26 Units total) into the skin at bedtime.  10 mL  11  . latanoprost (XALATAN) 0.005 % ophthalmic solution Place 1 drop into both eyes at bedtime.  2.5 mL  0  . levothyroxine (SYNTHROID, LEVOTHROID) 25 MCG tablet Take 1 tablet (25 mcg total) by mouth daily before breakfast.  30 tablet  0  . lisinopril (PRINIVIL,ZESTRIL) 40 MG tablet Take 40 mg by mouth daily.      . mometasone-formoterol (DULERA) 100-5 MCG/ACT AERO Inhale 2 puffs into the lungs 2 (two) times daily.  1 Inhaler    . nitroGLYCERIN (NITROSTAT) 0.4 MG SL tablet Place 1 tablet (0.4 mg total) under the tongue every 5 (five) minutes as needed for chest pain.  30 tablet  3  . ondansetron (ZOFRAN ODT) 8 MG disintegrating tablet Take 1 tablet (8 mg total) by mouth every 8 (eight) hours as needed for nausea or vomiting.  12 tablet  0  . Oxycodone HCl 10 MG TABS Take 10 mg by mouth every 4 (four) hours as needed (for severepain).      . pantoprazole (PROTONIX) 40 MG tablet Take 1 tablet (40 mg total) by mouth daily. For reflux.  30 tablet  11  . sertraline (ZOLOFT) 100 MG tablet Take 1 tablet (100 mg total) by mouth daily.  30 tablet  0  has not been taking ASA  Allergies: Allergies as of 09/02/2013 - Review Complete 09/02/2013  Allergen Reaction Noted  . Acetaminophen Nausea And Vomiting 09/02/2013  . Hydrocodone-acetaminophen Hives 01/02/2013  . Metformin Diarrhea 01/02/2013   Past Medical History    Diagnosis Date  . Asthma     past 10 years  . Diabetes mellitus     past 5 years  . Glaucoma     dx 06/2011-[facility]  . Hypertension   . Hyperlipidemia   . Hyperthyroidism   . History of TIA (transient ischemic attack)     01/06/2011, x2  . Pancreatitis   .  Gout   . Depression   . Renal disorder     chronic kidney disease, stage 3  . Chronic pain   . Glaucoma   . Arthritis     djd -lower back, left knee  . Degenerative disc disease   . Bipolar disorder   . Gastritis   . COPD (chronic obstructive pulmonary disease)   . Schizophrenia   . 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 Fatty liver     mild CT 06/2012  . DKA (diabetic ketoacidoses) 07/21/2013  . Stroke     TIAs  . Esophageal ring, acquired 08/12/2013   Past Surgical History  Procedure Laterality Date  . Abdominal hysterectomy  1990    still has ovaries   . Colonoscopy     Family History  Problem Relation Age of Onset  . Arthritis Mother   . Diabetes Mother   . Hypertension Mother   . Kidney disease Mother   . Heart disease Mother     died 52  . Heart disease      mother & father  . Hypertension      mother & father  . Colon cancer Neg Hx   . Prostate cancer Neg Hx   . Breast cancer Neg Hx   . Stomach cancer Neg Hx   . Esophageal cancer Neg Hx   . Hypertension Sister   . Hypertension Brother   . Kidney disease Brother    History   Social History  . Marital Status: Single    Spouse Name: N/A    Number of Children: 3  . Years of Education: N/A   Occupational History  . Disability    Social History Main Topics  . Smoking status: Current Some Day Smoker -- 0.02 packs/day for 30 years    Types: Cigarettes  . Smokeless tobacco: Never Used     Comment: smokes 2 cigs daily/uses patches.  . Alcohol Use: No     Comment: denied alcohol at this time  . Drug Use: Yes    Special: Cocaine     Comment: THC  cocaine, pt denies 08/12/13  . Sexual Activity: Yes     Comment:  hysterectomy   Other Topics Concern  . Not on file   Social History Narrative   On disability   Single 3 sons   3 coffee/day   3 regular pepsi/day   Updated 08/11/2013                   Review of Systems: General: +generalized weakness; no fevers, chills Skin: no rash HEENT: no vision changes, nasal congestion, ST Pulm: +cough CV: see HPI Abd: see HPI GU: no dysuria Ext: no arthralgias Neuro: see HPI  Physical Exam: Blood pressure 145/70, pulse 71, temperature 97.3 F (36.3 C), temperature source Oral, resp. rate 18, height 5\' 2"  (1.575 m), weight 181 lb (82.101 kg), SpO2 99.00%. General: alert, cooperative, and in no apparent distress; drowsy appearing  HEENT: pupils equal round and reactive to light, vision grossly intact, oropharynx clear and non-erythematous  Neck: supple Lungs: clear to ascultation bilaterally, normal work of respiration, no wheezes, rales, ronchi; +chest wall TTP Heart: regular rate and rhythm, no murmurs, gallops, or rubs Abdomen: soft, mild TTP epigastrium, non-distended, normal bowel sounds Extremities: warm, no pedal edema; strong DP pulses bilaterally Neurologic: alert & oriented X3, cranial nerves II-XII intact, strength 5/5 throughout (pt gave strong initial push that was 5/5 on R side throughout, though would give up  quickly), sensation intact to light touch, though diminished slightly RLE; normal cerebellar function;    Lab results: Basic Metabolic Panel:  Recent Labs  08/31/13 1118 09/02/13 1150 09/02/13 1158  NA 137 141 143  K 4.0 3.3* 3.1*  CL 98 104 106  CO2 22 24  --   GLUCOSE 449* 62* 64*  BUN 16 13 12   CREATININE 0.98 1.06 1.20*  CALCIUM 9.2 9.4  --    Liver Function Tests:  Recent Labs  09/02/13 1150  AST 22  ALT 14  ALKPHOS 138*  BILITOT 0.3  PROT 6.9  ALBUMIN 3.4*   CBC:  Recent Labs  08/31/13 1118 09/02/13 1150 09/02/13 1158  WBC 6.5 7.6  --   NEUTROABS  --  4.8  --   HGB 14.1 13.5 15.3*  HCT  41.5 40.0 45.0  MCV 86.1 85.3  --   PLT 305 296  --    Cardiac Enzymes:  Recent Labs  09/02/13 1551  TROPONINI <0.30   CBG:  Recent Labs  08/31/13 1117 09/02/13 1039 09/02/13 1228 09/02/13 1254 09/02/13 1401 09/02/13 1708  GLUCAP 412* 154* 42* 163* 119* 94   Coagulation:  Recent Labs  09/02/13 1150  LABPROT 12.8  INR 0.98   Urine Drug Screen: Drugs of Abuse     Component Value Date/Time   LABOPIA NONE DETECTED 09/02/2013 1239   LABOPIA NEG 01/23/2013 1507   COCAINSCRNUR POSITIVE* 09/02/2013 1239   COCAINSCRNUR NEG 01/23/2013 1507   LABBENZ NONE DETECTED 09/02/2013 1239   LABBENZ NEG 01/23/2013 1507   AMPHETMU NONE DETECTED 09/02/2013 1239   THCU NONE DETECTED 09/02/2013 1239   LABBARB NONE DETECTED 09/02/2013 1239   LABBARB NEG 01/23/2013 1507    Alcohol Level:  Recent Labs  09/02/13 1150  ETH <11   Urinalysis:  Recent Labs  08/31/13 1309 09/02/13 1239  COLORURINE YELLOW YELLOW  LABSPEC 1.036* 1.017  PHURINE 5.5 5.5  GLUCOSEU >1000* NEGATIVE  HGBUR NEGATIVE NEGATIVE  BILIRUBINUR NEGATIVE NEGATIVE  KETONESUR NEGATIVE NEGATIVE  PROTEINUR NEGATIVE NEGATIVE  UROBILINOGEN 0.2 0.2  NITRITE NEGATIVE NEGATIVE  LEUKOCYTESUR SMALL* NEGATIVE   Imaging results:  Ct Angio Head W/cm &/or Wo Cm  09/02/2013   CLINICAL DATA:  Right leg weakness. Blurred vision in the left eye. Dizziness. Question aneurysm.  EXAM: CT ANGIOGRAPHY HEAD AND NECK  TECHNIQUE: Multidetector CT imaging of the head and neck was performed using the standard protocol during bolus administration of intravenous contrast. Multiplanar CT image reconstructions and MIPs were obtained to evaluate the vascular anatomy. Carotid stenosis measurements (when applicable) are obtained utilizing NASCET criteria, using the distal internal carotid diameter as the denominator.  CONTRAST:  131mL OMNIPAQUE IOHEXOL 350 MG/ML SOLN  COMPARISON:  CT head without contrast 08/17/2013  FINDINGS: CTA HEAD FINDINGS  Mild white matter  changes are stable. No acute cortical infarct, hemorrhage, or mass lesion is present. The ventricles are of normal size. No significant extra-axial fluid collection is present.  The paranasal sinuses and mastoid air cells are clear. A remote left orbital blowout fracture is again seen.  The postcontrast images demonstrate no pathologic enhancement.  Intracranial internal carotid arteries demonstrate mild atherosclerotic irregularity within the cavernous segments bilaterally without significant stenosis. The ICA terminus is within normal limits bilaterally. The left A1 segment is dominant to the right. The anterior communicating artery is patent. The M1 segments and MCA bifurcations are intact bilaterally. The MCA branch vessels are within normal limits bilaterally.  The left vertebral artery is the dominant  vessel. The right vertebral artery essentially terminates at the PICA ascending a very small branch to the vertebrobasilar junction. The basilar artery is small with fetal type posterior cerebral arteries bilaterally. A small left P1 segment is present.  The PCA branch vessels are within normal limits.  Review of the MIP images confirms the above findings.  CTA NECK FINDINGS  A standard 3 vessel arch configuration is present. The vertebral arteries both originate from the subclavian arteries. The left vertebral artery is dominant. The origin of the left vertebral artery is obscured by an adjacent vein. There is no significant stenosis within the vertebral arteries of the neck.  The right common carotid artery is within normal limits. There is a focal calcification at the right carotid bifurcation without significant stenosis of the internal carotid artery relative to the distal vessel. The cervical ICA is otherwise normal.  The left common carotid artery is within normal limits. The bifurcation is unremarkable. The cervical left ICA is mildly tortuous without focal stenosis.  The soft tissues of the neck are  unremarkable. The salivary glands are intact. The lung apices demonstrate pleural blebs and mild atelectasis.  Review of the MIP images confirms the above findings.  IMPRESSION: 1. No evidence for intracranial aneurysm. 2. Scattered white matter hypoattenuation is again noted without significant change. The finding is nonspecific but can be seen in the setting of chronic microvascular ischemia, a demyelinating process such as multiple sclerosis, vasculitis, complicated migraine headaches, or as the sequelae of a prior infectious or inflammatory process. 3. Minimal atherosclerotic changes are present at the left carotid bifurcation and bilateral cavernous carotid arteries without significant stenosis.   Electronically Signed   By: Lawrence Santiago M.D.   On: 09/02/2013 12:49   Ct Angio Neck W/cm &/or Wo/cm  09/02/2013   CLINICAL DATA:  Right leg weakness. Blurred vision in the left eye. Dizziness. Question aneurysm.  EXAM: CT ANGIOGRAPHY HEAD AND NECK  TECHNIQUE: Multidetector CT imaging of the head and neck was performed using the standard protocol during bolus administration of intravenous contrast. Multiplanar CT image reconstructions and MIPs were obtained to evaluate the vascular anatomy. Carotid stenosis measurements (when applicable) are obtained utilizing NASCET criteria, using the distal internal carotid diameter as the denominator.  CONTRAST:  164mL OMNIPAQUE IOHEXOL 350 MG/ML SOLN  COMPARISON:  CT head without contrast 08/17/2013  FINDINGS: CTA HEAD FINDINGS  Mild white matter changes are stable. No acute cortical infarct, hemorrhage, or mass lesion is present. The ventricles are of normal size. No significant extra-axial fluid collection is present.  The paranasal sinuses and mastoid air cells are clear. A remote left orbital blowout fracture is again seen.  The postcontrast images demonstrate no pathologic enhancement.  Intracranial internal carotid arteries demonstrate mild atherosclerotic irregularity  within the cavernous segments bilaterally without significant stenosis. The ICA terminus is within normal limits bilaterally. The left A1 segment is dominant to the right. The anterior communicating artery is patent. The M1 segments and MCA bifurcations are intact bilaterally. The MCA branch vessels are within normal limits bilaterally.  The left vertebral artery is the dominant vessel. The right vertebral artery essentially terminates at the PICA ascending a very small branch to the vertebrobasilar junction. The basilar artery is small with fetal type posterior cerebral arteries bilaterally. A small left P1 segment is present.  The PCA branch vessels are within normal limits.  Review of the MIP images confirms the above findings.  CTA NECK FINDINGS  A standard 3 vessel arch configuration  is present. The vertebral arteries both originate from the subclavian arteries. The left vertebral artery is dominant. The origin of the left vertebral artery is obscured by an adjacent vein. There is no significant stenosis within the vertebral arteries of the neck.  The right common carotid artery is within normal limits. There is a focal calcification at the right carotid bifurcation without significant stenosis of the internal carotid artery relative to the distal vessel. The cervical ICA is otherwise normal.  The left common carotid artery is within normal limits. The bifurcation is unremarkable. The cervical left ICA is mildly tortuous without focal stenosis.  The soft tissues of the neck are unremarkable. The salivary glands are intact. The lung apices demonstrate pleural blebs and mild atelectasis.  Review of the MIP images confirms the above findings.  IMPRESSION: 1. No evidence for intracranial aneurysm. 2. Scattered white matter hypoattenuation is again noted without significant change. The finding is nonspecific but can be seen in the setting of chronic microvascular ischemia, a demyelinating process such as multiple  sclerosis, vasculitis, complicated migraine headaches, or as the sequelae of a prior infectious or inflammatory process. 3. Minimal atherosclerotic changes are present at the left carotid bifurcation and bilateral cavernous carotid arteries without significant stenosis.   Electronically Signed   By: Lawrence Santiago M.D.   On: 09/02/2013 12:49   Dg Chest Portable 1 View  09/02/2013   CLINICAL DATA:  Weakness and dizziness ; hypertension  EXAM: PORTABLE CHEST - 1 VIEW  COMPARISON:  August 31, 2013  FINDINGS: Lungs are clear. Heart size and pulmonary vascularity are normal. No adenopathy. No bone lesions.  IMPRESSION: No edema or consolidation.   Electronically Signed   By: Lowella Grip M.D.   On: 09/02/2013 12:00   Other results: EKG:not done  Assessment & Plan by Problem:  # Generalized weakness vs TIA vs CVA: Patient presenting with R arm/leg weakness, head and neck CTA shows no acute abnormality with atherosclerotic changes at L carotid bifucation. Patient with multiple risk factors for CVA/TIA including type 2 DM, HDL, HTN, cocaine abuse (UDS+ cocaine on admission). Neurology has already been consulted and would like risk factor stratification w/ A1c, lipids as well as complete stroke work up including MRI, 2D echo, carotid dopplers. Patient also with a relatively nonfocal neurological exam. Her generalized weakness and pre-syncopal episode earlier today may be explained by her hypoglycemia found in the ED. Other possible etiologies include multiple sclerosis (scattered white matter hypoattenuation on CTA), cardiac arrhythmia or ACS (CP and SOB w/ presyncopal episode), conversion disorder, anxiety (h/o anxiety), orthostatic hypotension (though blood pressure stable). -admit to telemetry -2D echo, carotid dopplers, MRI -lipid panel, A1c -EKG -neuro checks q2h -ASA 325mg  daily  - PT/OT -patient passed bedside swallow, so will give HH diet -check CBGs q4h -check orthostatic vital signs -BMP in  AM -cycle troponin -continue home xanax  -continue home lipitor 80mg  daily -permissive HTN  # Atypical Chest pain/ epigastric pain: Patient presents with sharp, intermittent chest pain that she has had for months and has been evaluated by the ED/hospitalizations and has been negative for ACS. CP is not exertional in nature and can occur at both rest and w/ exertion. However, she does have many RFs for ACS including DM, HTN, HLD (last LDL139, HDL 48 01/2013), so will need to rule out ACS. Patient also UDS+ cocaine this admission, so cocaine induced vasospasm should be considered. Patient has recent EGD done by Dr. Carlean Purl for symptom of dysphagia, which patient  continues to experience, and it showed a schatzki ring (s/p dilation 07/2013). Patient's pain is sometimes associated with eating, which points toward GI etiology as more likely than cardiac. Lower sternum is also TTP, so musculoskeletal origin for pain is possible. -EKG -cycle troponin -telemetry -oxycodone 10mg  q4h (home regimen) -continue home protonix 40mg  daily  -continue home lipitor 80mg  daily and gemfibrozil 600mg  BID  # Hypokalemia: Patient with K 3.3 on admission. Will supplement with kdur 62mEq x 1 dose.  -check BMP in AM -check Mg level  # Mental health disorders: Pt with reported hx of bipolar disorder, anxiety/depression, schizophrenia. Mood appears stable. She takes depakote 1000mg  qHS, xanax .5mg  qHS prn, zoloft 100mg  daily.  -continue home regimen as above  # Hypothyroidism: Patient's last TSH 78.029 3/23, which is down from admission in February when it was 114 and her synthroid was restarted at 77mcg daily (pt had been noncompliant prior to this). Patient reports compliance with synthroid now. Per chart review, working toward up titration of her synthroid as outpatient. -repeat TSH -synthroid 5mcg daily - will consider increasing depending on TSH level  #HTN: Pt normotensive. Will hold home antihypertensives  (lisinopril 40mg  daily and amlodipine 5mg  daily) for permissive HTN.  # DM type 2: Last A1c 15.3%. She reports compliance with home lantus 26U qHS and Novolog 3U with meals- though I doubt this is the case given her poor control as highlighted by A1c. Of note, patient is hypoglycemic on admission as per above. Denies taking more insulin than she is supposed to. -check A1c per neurology  -holding lantus given hypoglycemia on admission - SSI mod -continue home gabapentin 300mg  TID  # COPD: Stable no exacerbation. She takes albuterol and ? Both dulera and advair at home. This is duplicate therapy, so will plan to stop advair as this had been stopped in the past. -continue albuterol neb prn -continue home dulera   # VTE: heparin  # Diet: HH diet  Code status: full  Dispo: Disposition is deferred at this time, awaiting improvement of current medical problems. Anticipated discharge in approximately 1 day(s).   The patient does have a current PCP Rebecca Eaton, MD) and does need an [facility] hospital follow-up appointment after discharge.  The patient does not have transportation limitations that hinder transportation to clinic appointments.  Signed: Rebecca Eaton, MD 09/02/2013, 5:34 PM

## 2013-09-02 NOTE — ED Notes (Signed)
Bed: ST41 Expected date:  Expected time:  Means of arrival:  Comments: EMS- weak, dizzy, hyperglycemia

## 2013-09-02 NOTE — ED Provider Notes (Signed)
CSN: [id number]     Arrival date & time 09/02/13  1035 History   First MD Initiated Contact with Patient 09/02/13 1048     Chief Complaint  Patient presents with  . Weakness  . Dizziness     (Consider location/radiation/quality/duration/timing/severity/associated sxs/prior Treatment) HPI 58 year old female presents with a near syncopal episode occurred approximately one hour prior to arrival. She states she was standing in line at the bank and felt lightheaded like the last time she was severely hyperglycemic. She states she was put in a chair and felt a little bit better but she still feeling lightheaded. There is no specific dizziness or room spinning she states it feels like she might pass out. When asked about chest pain she states she's been having intermittent sharp midsternal chest pain for the past 3 weeks. She states this is evaluated in the ED and she was told it was from coughing. She's no longer coughing. The pain seems to worsen with walking and sometimes exertion. Does get better with rest or lying flat. There is no specific pleuritic symptoms. Of note she states she does feel overall weak. After the exam she was asked to clarify for right lower she may was weaker than her left she states this is been gone since 10 AM today. States feels at the time she was diagnosed with a mini stroke. She denies any headaches or neck pain. She states her left eye has been blurry since her lightheaded symptoms started.  Past Medical History  Diagnosis Date  . Asthma     past 10 years  . Diabetes mellitus     past 5 years  . Glaucoma     dx 06/2011-[facility]  . Hypertension   . Hyperlipidemia   . Hyperthyroidism   . History of TIA (transient ischemic attack)     01/06/2011, x2  . Pancreatitis   . Gout   . Depression   . Renal disorder     chronic kidney disease, stage 3  . Chronic pain   . Glaucoma   . Arthritis     djd -lower back, left knee  . Degenerative disc disease   .  Bipolar disorder   . Gastritis   . COPD (chronic obstructive pulmonary disease)   . Schizophrenia   . 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 Fatty liver     mild CT 06/2012  . DKA (diabetic ketoacidoses) 07/21/2013  . Stroke     TIAs  . Esophageal ring, acquired 08/12/2013   Past Surgical History  Procedure Laterality Date  . Abdominal hysterectomy  1990    still has ovaries   . Colonoscopy     Family History  Problem Relation Age of Onset  . Arthritis Mother   . Diabetes Mother   . Hypertension Mother   . Kidney disease Mother   . Heart disease Mother     died 76  . Heart disease      mother & father  . Hypertension      mother & father  . Colon cancer Neg Hx   . Prostate cancer Neg Hx   . Breast cancer Neg Hx   . Stomach cancer Neg Hx   . Esophageal cancer Neg Hx   . Hypertension Sister   . Hypertension Brother   . Kidney disease Brother    History  Substance Use Topics  . Smoking status: Current Some Day Smoker -- 0.02 packs/day for 30 years    Types:  Cigarettes  . Smokeless tobacco: Never Used     Comment: smokes 2 cigs daily/uses patches.  . Alcohol Use: No     Comment: denied alcohol at this time   OB History   Grav Para Term Preterm Abortions TAB SAB Ect Mult Living                 Review of Systems  Eyes: Positive for visual disturbance. Negative for photophobia.  Respiratory: Positive for shortness of breath.   Cardiovascular: Positive for chest pain. Negative for leg swelling.  Gastrointestinal: Negative for vomiting and abdominal pain.  Neurological: Positive for weakness and numbness. Negative for headaches.  All other systems reviewed and are negative.     Allergies  Acetaminophen; Hydrocodone-acetaminophen; and Metformin  Home Medications   Current Outpatient Rx  Name  Route  Sig  Dispense  Refill  . albuterol (PROVENTIL HFA;VENTOLIN HFA) 108 (90 BASE) MCG/ACT inhaler   Inhalation   Inhale 2 puffs into the lungs every 6  (six) hours as needed for wheezing.   1 Inhaler   2   . albuterol (PROVENTIL) (2.5 MG/3ML) 0.083% nebulizer solution   Inhalation   Inhale 2.5 mg into the lungs every 6 (six) hours as needed for wheezing.         Marland Kitchen allopurinol (ZYLOPRIM) 300 MG tablet   Oral   Take 300 mg by mouth daily. For gout.         Marland Kitchen ALPRAZolam (XANAX) 0.5 MG tablet   Oral   Take 1 tablet (0.5 mg total) by mouth at bedtime as needed for anxiety.   10 tablet   0   . amLODipine (NORVASC) 5 MG tablet   Oral   Take 5 mg by mouth daily. For hypertension.         Marland Kitchen aspirin EC 81 MG tablet   Oral   Take 1 tablet (81 mg total) by mouth daily. For platelet aggregation.   30 tablet   0   . atorvastatin (LIPITOR) 80 MG tablet   Oral   Take 80 mg by mouth every morning.         . brimonidine (ALPHAGAN) 0.2 % ophthalmic solution   Both Eyes   Place 1 drop into both eyes 2 (two) times daily.   5 mL   12   . colchicine 0.6 MG tablet   Oral   Take 1 tablet (0.6 mg total) by mouth daily as needed (for gout flare-ups).         . divalproex (DEPAKOTE) 500 MG DR tablet   Oral   Take 1,000 mg by mouth at bedtime.          . Fluticasone-Salmeterol (ADVAIR DISKUS) 250-50 MCG/DOSE AEPB   Inhalation   Inhale 1 puff into the lungs 2 times daily at 12 noon and 4 pm.   60 each   0   . gabapentin (NEURONTIN) 100 MG capsule   Oral   Take 300 mg by mouth 3 (three) times daily. Take 3 (100mg ) capsules for a dose of (300mg ) three times each day for neuropathic pain and anxiety.         Marland Kitchen gemfibrozil (LOPID) 600 MG tablet   Oral   Take 600 mg by mouth 2 (two) times daily before a meal.         . insulin aspart (NOVOLOG) 100 UNIT/ML injection   Subcutaneous   Inject 3 Units into the skin 3 (three) times daily with meals.  10 mL   11   . insulin glargine (LANTUS) 100 UNIT/ML injection   Subcutaneous   Inject 0.26 mLs (26 Units total) into the skin at bedtime.   10 mL   11   . latanoprost  (XALATAN) 0.005 % ophthalmic solution   Both Eyes   Place 1 drop into both eyes at bedtime.   2.5 mL   0   . levothyroxine (SYNTHROID, LEVOTHROID) 25 MCG tablet   Oral   Take 1 tablet (25 mcg total) by mouth daily before breakfast.   30 tablet   0   . lisinopril (PRINIVIL,ZESTRIL) 40 MG tablet   Oral   Take 40 mg by mouth daily.         . mometasone-formoterol (DULERA) 100-5 MCG/ACT AERO   Inhalation   Inhale 2 puffs into the lungs 2 (two) times daily.   1 Inhaler      . nitroGLYCERIN (NITROSTAT) 0.4 MG SL tablet   Sublingual   Place 1 tablet (0.4 mg total) under the tongue every 5 (five) minutes as needed for chest pain.   30 tablet   3   . ondansetron (ZOFRAN ODT) 8 MG disintegrating tablet   Oral   Take 1 tablet (8 mg total) by mouth every 8 (eight) hours as needed for nausea or vomiting.   12 tablet   0   . Oxycodone HCl 10 MG TABS   Oral   Take 10 mg by mouth every 4 (four) hours as needed (for severepain).         . pantoprazole (PROTONIX) 40 MG tablet   Oral   Take 1 tablet (40 mg total) by mouth daily. For reflux.   30 tablet   11   . sertraline (ZOLOFT) 100 MG tablet   Oral   Take 1 tablet (100 mg total) by mouth daily.   30 tablet   0    BP 127/70  Pulse 67  Temp(Src) 98.2 F (36.8 C) (Oral)  Resp 20  SpO2 95% Physical Exam  Vitals reviewed. Constitutional: She is oriented to person, place, and time. She appears well-developed and well-nourished.  HENT:  Head: Normocephalic and atraumatic.  Right Ear: External ear normal.  Left Ear: External ear normal.  Nose: Nose normal.  Eyes: EOM are normal. Pupils are equal, round, and reactive to light. Right eye exhibits no discharge. Left eye exhibits no discharge.  Cardiovascular: Normal rate, regular rhythm and normal heart sounds.   Pulmonary/Chest: Effort normal and breath sounds normal.  Abdominal: Soft. There is no tenderness.  Neurological: She is alert and oriented to person, place,  and time.  Seems sleepy but is currently GCS 15 and awake. She is diffusely weak in upper and lower extremities, but RLE is worse than left. Uppers are equal. She can only lift her RLE off the bed for approx 1 second. Both of her arms have some drift when her eyes are closed. CN 2-12 are grossly intact.  Skin: Skin is warm and dry.    ED Course  Procedures (including critical care time) Labs Review Labs Reviewed  CBC - Abnormal; Notable for the following:    RDW 15.9 (*)    All other components within normal limits  CBG MONITORING, ED - Abnormal; Notable for the following:    Glucose-Capillary 154 (*)    All other components within normal limits  I-STAT CHEM 8, ED - Abnormal; Notable for the following:    Potassium 3.1 (*)    Creatinine,  Ser 1.20 (*)    Glucose, Bld 64 (*)    Hemoglobin 15.3 (*)    All other components within normal limits  CBG MONITORING, ED - Abnormal; Notable for the following:    Glucose-Capillary 42 (*)    All other components within normal limits  PROTIME-INR  APTT  DIFFERENTIAL  ETHANOL  COMPREHENSIVE METABOLIC PANEL  URINE RAPID DRUG SCREEN (HOSP PERFORMED)  URINALYSIS, ROUTINE W REFLEX MICROSCOPIC  I-STAT TROPOININ, ED  I-STAT TROPOININ, ED   Imaging Review Dg Chest Portable 1 View  09/02/2013   CLINICAL DATA:  Weakness and dizziness ; hypertension  EXAM: PORTABLE CHEST - 1 VIEW  COMPARISON:  August 31, 2013  FINDINGS: Lungs are clear. Heart size and pulmonary vascularity are normal. No adenopathy. No bone lesions.  IMPRESSION: No edema or consolidation.   Electronically Signed   By: Lowella Grip M.D.   On: 09/02/2013 12:00     EKG Interpretation None       Date: 09/02/2013  Rate: 71  Rhythm: normal sinus rhythm  QRS Axis: right  Intervals: QT prolonged  ST/T Wave abnormalities: nonspecific T wave changes  Conduction Disutrbances:none  Narrative Interpretation:   Old EKG Reviewed: unchanged   MDM   Final diagnoses:  Right leg  weakness    11:55 AM due to the patient having appreciable weakness in her right lower extremity her saying that her symptoms started approximately 1/2 hours ago at 10 AM a code stroke was called. I discussed this with Dr. Doy Mince of neurology, and talked to the patient more with her on the phone. The patient's symptoms and onset seem to change. She states she went to the bathroom at 6 AM and was normal. She went back to the bed and then keeps changing her story as to when the numbness and tingling in her right lower [place] started. First it was at 10 AM didn't seem to be back to 9 AM or 9:30. It seems that the symptoms were very mild or not really noticeable around this time. Do to this, the most reasonable last seen normal we can attribute this to 6 AM. This puts her out of TPA window. Due to this we'll get a CT head, and if no blood get a CT angiogram head and neck.  12:26 PM Head CT grossly normal on my read. CT angio pending. Discussed again with Dr. Doy Mince, will send to ED at [facility] for neurology eval. Dr. Audie Pinto made aware. Repeat glucose 42, given D50. I do not feel this is the cause of her symptoms as EMS checked her glucose at the bank and it was 190s and she states she was having symptoms then.  Ephraim Hamburger, MD 09/02/13 1235

## 2013-09-02 NOTE — ED Notes (Signed)
Pt arriives via [facility] from [facility] iv  Intact 20 left hand Velva Harman and Dr Doy Mince at bedside

## 2013-09-02 NOTE — Consult Note (Addendum)
Referring Physician: Regenia Skeeter    Chief Complaint: Right leg weakness and numbness   HPI: TAMELIA MICHALOWSKI is an 58 y.o. female who reports awakening this morning normal.  She laid back down and awakened later to use the bathroom again.  She noticed that her right leg was tingling and later felt that it was weak as well.  She was able to get dressed though and go to the back.  In the bank she was presyncopal.  She described some dizziness as well.  EMS was called and the patient was brought in for evaluation.  Code stroke was called in the [facility].  Initially CBG was normal at 152 but dropped to 42 and D50 given.  Repeat CBG 119.    Date last known well: Date: 09/02/2013 Time last known well: Time: 06:30 tPA Given: No: Outside treatment window  Past Medical History  Diagnosis Date  . Asthma     past 10 years  . Diabetes mellitus     past 5 years  . Glaucoma     dx 06/2011-[facility]  . Hypertension   . Hyperlipidemia   . Hyperthyroidism   . History of TIA (transient ischemic attack)     01/06/2011, x2  . Pancreatitis   . Gout   . Depression   . Renal disorder     chronic kidney disease, stage 3  . Chronic pain   . Glaucoma   . Arthritis     djd -lower back, left knee  . Degenerative disc disease   . Bipolar disorder   . Gastritis   . COPD (chronic obstructive pulmonary disease)   . Schizophrenia   . 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 Fatty liver     mild CT 06/2012  . DKA (diabetic ketoacidoses) 07/21/2013  . Stroke     TIAs  . Esophageal ring, acquired 08/12/2013    Past Surgical History  Procedure Laterality Date  . Abdominal hysterectomy  1990    still has ovaries   . Colonoscopy      Family History  Problem Relation Age of Onset  . Arthritis Mother   . Diabetes Mother   . Hypertension Mother   . Kidney disease Mother   . Heart disease Mother     died 47  . Heart disease      mother & father  . Hypertension      mother & father  . Colon  cancer Neg Hx   . Prostate cancer Neg Hx   . Breast cancer Neg Hx   . Stomach cancer Neg Hx   . Esophageal cancer Neg Hx   . Hypertension Sister   . Hypertension Brother   . Kidney disease Brother    Social History:  reports that she has been smoking Cigarettes.  She has a .6 pack-year smoking history. She has never used smokeless tobacco. She reports that she uses illicit drugs (Cocaine). She reports that she does not drink alcohol.  Allergies:  Allergies  Allergen Reactions  . Acetaminophen Nausea And Vomiting  . Hydrocodone-Acetaminophen Hives    Vicodin  . Metformin Diarrhea    Medications: I have reviewed the patient's current medications. Prior to Admission:  Current outpatient prescriptions: albuterol (PROVENTIL HFA;VENTOLIN HFA) 108 (90 BASE) MCG/ACT inhaler, Inhale 2 puffs into the lungs every 6 (six) hours as needed for wheezing., Disp: 1 Inhaler, Rfl: 2;  albuterol (PROVENTIL) (2.5 MG/3ML) 0.083% nebulizer solution, Inhale 2.5 mg into the lungs every 6 (six)  hours as needed for wheezing., Disp: , Rfl: ;  allopurinol (ZYLOPRIM) 300 MG tablet, Take 300 mg by mouth daily. For gout., Disp: , Rfl:  ALPRAZolam (XANAX) 0.5 MG tablet, Take 1 tablet (0.5 mg total) by mouth at bedtime as needed for anxiety., Disp: 10 tablet, Rfl: 0;  amLODipine (NORVASC) 5 MG tablet, Take 5 mg by mouth daily. For hypertension., Disp: , Rfl: ;  aspirin EC 81 MG tablet, Take 1 tablet (81 mg total) by mouth daily. For platelet aggregation., Disp: 30 tablet, Rfl: 0;  atorvastatin (LIPITOR) 80 MG tablet, Take 80 mg by mouth every morning., Disp: , Rfl:  brimonidine (ALPHAGAN) 0.2 % ophthalmic solution, Place 1 drop into both eyes 2 (two) times daily., Disp: 5 mL, Rfl: 12;  colchicine 0.6 MG tablet, Take 1 tablet (0.6 mg total) by mouth daily as needed (for gout flare-ups)., Disp: , Rfl: ;  divalproex (DEPAKOTE) 500 MG DR tablet, Take 1,000 mg by mouth at bedtime. , Disp: , Rfl:  Fluticasone-Salmeterol (ADVAIR  DISKUS) 250-50 MCG/DOSE AEPB, Inhale 1 puff into the lungs 2 times daily at 12 noon and 4 pm., Disp: 60 each, Rfl: 0;  gabapentin (NEURONTIN) 100 MG capsule, Take 300 mg by mouth 3 (three) times daily. Take 3 (100mg ) capsules for a dose of (300mg ) three times each day for neuropathic pain and anxiety., Disp: , Rfl:  gemfibrozil (LOPID) 600 MG tablet, Take 600 mg by mouth 2 (two) times daily before a meal., Disp: , Rfl: ;  insulin aspart (NOVOLOG) 100 UNIT/ML injection, Inject 3 Units into the skin 3 (three) times daily with meals., Disp: 10 mL, Rfl: 11;  insulin glargine (LANTUS) 100 UNIT/ML injection, Inject 0.26 mLs (26 Units total) into the skin at bedtime., Disp: 10 mL, Rfl: 11 latanoprost (XALATAN) 0.005 % ophthalmic solution, Place 1 drop into both eyes at bedtime., Disp: 2.5 mL, Rfl: 0;  levothyroxine (SYNTHROID, LEVOTHROID) 25 MCG tablet, Take 1 tablet (25 mcg total) by mouth daily before breakfast., Disp: 30 tablet, Rfl: 0;  lisinopril (PRINIVIL,ZESTRIL) 40 MG tablet, Take 40 mg by mouth daily., Disp: , Rfl:  mometasone-formoterol (DULERA) 100-5 MCG/ACT AERO, Inhale 2 puffs into the lungs 2 (two) times daily., Disp: 1 Inhaler, Rfl: ;  nitroGLYCERIN (NITROSTAT) 0.4 MG SL tablet, Place 1 tablet (0.4 mg total) under the tongue every 5 (five) minutes as needed for chest pain., Disp: 30 tablet, Rfl: 3 ondansetron (ZOFRAN ODT) 8 MG disintegrating tablet, Take 1 tablet (8 mg total) by mouth every 8 (eight) hours as needed for nausea or vomiting., Disp: 12 tablet, Rfl: 0;  Oxycodone HCl 10 MG TABS, Take 10 mg by mouth every 4 (four) hours as needed (for severepain)., Disp: , Rfl: ;  pantoprazole (PROTONIX) 40 MG tablet, Take 1 tablet (40 mg total) by mouth daily. For reflux., Disp: 30 tablet, Rfl: 11 sertraline (ZOLOFT) 100 MG tablet, Take 1 tablet (100 mg total) by mouth daily., Disp: 30 tablet, Rfl: 0  ROS: History obtained from the patient  General ROS: negative for - chills, fatigue, fever, night  sweats, weight gain or weight loss Psychological ROS: negative for - behavioral disorder, hallucinations, memory difficulties, mood swings or suicidal ideation Ophthalmic ROS: poor vision from the left eye. ENT ROS: negative for - epistaxis, nasal discharge, oral lesions, sore throat, tinnitus or vertigo Allergy and Immunology ROS: negative for - hives or itchy/watery eyes Hematological and Lymphatic ROS: negative for - bleeding problems, bruising or swollen lymph nodes Endocrine ROS: negative for - galactorrhea, hair  pattern changes, polydipsia/polyuria or temperature intolerance Respiratory ROS: negative for - cough, hemoptysis, shortness of breath or wheezing Cardiovascular ROS: negative for - chest pain, dyspnea on exertion, edema or irregular heartbeat Gastrointestinal ROS: negative for - abdominal pain, diarrhea, hematemesis, nausea/vomiting or stool incontinence Genito-Urinary ROS: negative for - dysuria, hematuria, incontinence or urinary frequency/urgency Musculoskeletal ROS: negative for - joint swelling or muscular weakness Neurological ROS: as noted in HPI Dermatological ROS: negative for rash and skin lesion changes  Physical Examination: Blood pressure 123/80, pulse 68, temperature 98.7 F (37.1 C), temperature source Oral, resp. rate 18, SpO2 99.00%.  Neurologic Examination: Mental Status: Alert, oriented, thought content appropriate.  Speech fluent without evidence of aphasia.  Able to follow 3 step commands without difficulty. Cranial Nerves: II: Discs flat bilaterally; Reports decreased vision in all visual fields from the left eye sparing inferior fields.  Vision decreased superiorly in the right eye.  Pupils equal, round, reactive to light and accommodation III,IV, VI: ptosis not present, extra-ocular motions intact bilaterally V,VII: smile symmetric, facial light touch sensation decreased on the right splitting the midline VIII: hearing normal bilaterally IX,X: gag  reflex present XI: bilateral shoulder shrug XII: midline tongue extension Motor: Right : Upper extremity   5/5    Left:     Upper extremity   5/5  Lower extremity   5-/5     Lower extremity   5/5 Tone and bulk:normal tone throughout; no atrophy noted Sensory: Pinprick and light touch decreased on the right splitting the midline Deep Tendon Reflexes: 2+ throughout with absent AJ's bilaterally Plantars: Right: downgoing   Left: downgoing Cerebellar: normal finger-to-nose and normal heel-to-shin test Gait: Unable to test CV: pulses palpable throughout     Laboratory Studies:  Basic Metabolic Panel:  Recent Labs Lab 08/31/13 1118 09/02/13 1150 09/02/13 1158  NA 137 141 143  K 4.0 3.3* 3.1*  CL 98 104 106  CO2 22 24  --   GLUCOSE 449* 62* 64*  BUN 16 13 12   CREATININE 0.98 1.06 1.20*  CALCIUM 9.2 9.4  --     Liver Function Tests:  Recent Labs Lab 09/02/13 1150  AST 22  ALT 14  ALKPHOS 138*  BILITOT 0.3  PROT 6.9  ALBUMIN 3.4*   No results found for this basename: LIPASE, AMYLASE,  in the last 168 hours No results found for this basename: AMMONIA,  in the last 168 hours  CBC:  Recent Labs Lab 08/31/13 1118 09/02/13 1150 09/02/13 1158  WBC 6.5 7.6  --   NEUTROABS  --  4.8  --   HGB 14.1 13.5 15.3*  HCT 41.5 40.0 45.0  MCV 86.1 85.3  --   PLT 305 296  --     Cardiac Enzymes: No results found for this basename: CKTOTAL, CKMB, CKMBINDEX, TROPONINI,  in the last 168 hours  BNP: No components found with this basename: POCBNP,   CBG:  Recent Labs Lab 08/31/13 1117 09/02/13 1039 09/02/13 1228 09/02/13 1254  GLUCAP 412* 154* 42* 163*    Microbiology: Results for orders placed during the hospital encounter of 07/20/13  MRSA PCR SCREENING     Status: None   Collection Time    07/20/13  6:08 PM      Result Value Ref Range Status   MRSA by PCR NEGATIVE  NEGATIVE Final   Comment:            The GeneXpert MRSA Assay (FDA     approved for NASAL  specimens  only), is one component of a     comprehensive MRSA colonization     surveillance program. It is not     intended to diagnose MRSA     infection nor to guide or     monitor treatment for     MRSA infections.    Coagulation Studies:  Recent Labs  09/02/13 1150  LABPROT 12.8  INR 0.98    Urinalysis:  Recent Labs Lab 08/31/13 1309 09/02/13 1239  COLORURINE YELLOW YELLOW  LABSPEC 1.036* 1.017  PHURINE 5.5 5.5  GLUCOSEU >1000* NEGATIVE  HGBUR NEGATIVE NEGATIVE  BILIRUBINUR NEGATIVE NEGATIVE  KETONESUR NEGATIVE NEGATIVE  PROTEINUR NEGATIVE NEGATIVE  UROBILINOGEN 0.2 0.2  NITRITE NEGATIVE NEGATIVE  LEUKOCYTESUR SMALL* NEGATIVE    Lipid Panel:    Component Value Date/Time   CHOL 218* 02/23/2013 1030   TRIG 156* 02/23/2013 1030   HDL 48 02/23/2013 1030   CHOLHDL 4.5 02/23/2013 1030   VLDL 31 02/23/2013 1030   LDLCALC 139* 02/23/2013 1030    HgbA1C:  Lab Results  Component Value Date   HGBA1C 15.3* 07/20/2013    Urine Drug Screen:     Component Value Date/Time   LABOPIA NONE DETECTED 09/02/2013 1239   LABOPIA NEG 01/23/2013 1507   COCAINSCRNUR POSITIVE* 09/02/2013 1239   COCAINSCRNUR NEG 01/23/2013 1507   LABBENZ NONE DETECTED 09/02/2013 1239   LABBENZ NEG 01/23/2013 1507   AMPHETMU NONE DETECTED 09/02/2013 1239   THCU NONE DETECTED 09/02/2013 1239   LABBARB NONE DETECTED 09/02/2013 1239   LABBARB NEG 01/23/2013 1507    Alcohol Level:  Recent Labs Lab 09/02/13 1150  ETH <11    Other results: EKG: normal sinus rhythm at 83 bpm.  Imaging: Ct Angio Head W/cm &/or Wo Cm  09/02/2013   CLINICAL DATA:  Right leg weakness. Blurred vision in the left eye. Dizziness. Question aneurysm.  EXAM: CT ANGIOGRAPHY HEAD AND NECK  TECHNIQUE: Multidetector CT imaging of the head and neck was performed using the standard protocol during bolus administration of intravenous contrast. Multiplanar CT image reconstructions and MIPs were obtained to evaluate the vascular anatomy.  Carotid stenosis measurements (when applicable) are obtained utilizing NASCET criteria, using the distal internal carotid diameter as the denominator.  CONTRAST:  162mL OMNIPAQUE IOHEXOL 350 MG/ML SOLN  COMPARISON:  CT head without contrast 08/17/2013  FINDINGS: CTA HEAD FINDINGS  Mild white matter changes are stable. No acute cortical infarct, hemorrhage, or mass lesion is present. The ventricles are of normal size. No significant extra-axial fluid collection is present.  The paranasal sinuses and mastoid air cells are clear. A remote left orbital blowout fracture is again seen.  The postcontrast images demonstrate no pathologic enhancement.  Intracranial internal carotid arteries demonstrate mild atherosclerotic irregularity within the cavernous segments bilaterally without significant stenosis. The ICA terminus is within normal limits bilaterally. The left A1 segment is dominant to the right. The anterior communicating artery is patent. The M1 segments and MCA bifurcations are intact bilaterally. The MCA branch vessels are within normal limits bilaterally.  The left vertebral artery is the dominant vessel. The right vertebral artery essentially terminates at the PICA ascending a very small branch to the vertebrobasilar junction. The basilar artery is small with fetal type posterior cerebral arteries bilaterally. A small left P1 segment is present.  The PCA branch vessels are within normal limits.  Review of the MIP images confirms the above findings.  CTA NECK FINDINGS  A standard 3 vessel arch configuration is present. The vertebral arteries  both originate from the subclavian arteries. The left vertebral artery is dominant. The origin of the left vertebral artery is obscured by an adjacent vein. There is no significant stenosis within the vertebral arteries of the neck.  The right common carotid artery is within normal limits. There is a focal calcification at the right carotid bifurcation without significant  stenosis of the internal carotid artery relative to the distal vessel. The cervical ICA is otherwise normal.  The left common carotid artery is within normal limits. The bifurcation is unremarkable. The cervical left ICA is mildly tortuous without focal stenosis.  The soft tissues of the neck are unremarkable. The salivary glands are intact. The lung apices demonstrate pleural blebs and mild atelectasis.  Review of the MIP images confirms the above findings.  IMPRESSION: 1. No evidence for intracranial aneurysm. 2. Scattered white matter hypoattenuation is again noted without significant change. The finding is nonspecific but can be seen in the setting of chronic microvascular ischemia, a demyelinating process such as multiple sclerosis, vasculitis, complicated migraine headaches, or as the sequelae of a prior infectious or inflammatory process. 3. Minimal atherosclerotic changes are present at the left carotid bifurcation and bilateral cavernous carotid arteries without significant stenosis.   Electronically Signed   By: Lawrence Santiago M.D.   On: 09/02/2013 12:49   Ct Angio Neck W/cm &/or Wo/cm  09/02/2013   CLINICAL DATA:  Right leg weakness. Blurred vision in the left eye. Dizziness. Question aneurysm.  EXAM: CT ANGIOGRAPHY HEAD AND NECK  TECHNIQUE: Multidetector CT imaging of the head and neck was performed using the standard protocol during bolus administration of intravenous contrast. Multiplanar CT image reconstructions and MIPs were obtained to evaluate the vascular anatomy. Carotid stenosis measurements (when applicable) are obtained utilizing NASCET criteria, using the distal internal carotid diameter as the denominator.  CONTRAST:  119mL OMNIPAQUE IOHEXOL 350 MG/ML SOLN  COMPARISON:  CT head without contrast 08/17/2013  FINDINGS: CTA HEAD FINDINGS  Mild white matter changes are stable. No acute cortical infarct, hemorrhage, or mass lesion is present. The ventricles are of normal size. No significant  extra-axial fluid collection is present.  The paranasal sinuses and mastoid air cells are clear. A remote left orbital blowout fracture is again seen.  The postcontrast images demonstrate no pathologic enhancement.  Intracranial internal carotid arteries demonstrate mild atherosclerotic irregularity within the cavernous segments bilaterally without significant stenosis. The ICA terminus is within normal limits bilaterally. The left A1 segment is dominant to the right. The anterior communicating artery is patent. The M1 segments and MCA bifurcations are intact bilaterally. The MCA branch vessels are within normal limits bilaterally.  The left vertebral artery is the dominant vessel. The right vertebral artery essentially terminates at the PICA ascending a very small branch to the vertebrobasilar junction. The basilar artery is small with fetal type posterior cerebral arteries bilaterally. A small left P1 segment is present.  The PCA branch vessels are within normal limits.  Review of the MIP images confirms the above findings.  CTA NECK FINDINGS  A standard 3 vessel arch configuration is present. The vertebral arteries both originate from the subclavian arteries. The left vertebral artery is dominant. The origin of the left vertebral artery is obscured by an adjacent vein. There is no significant stenosis within the vertebral arteries of the neck.  The right common carotid artery is within normal limits. There is a focal calcification at the right carotid bifurcation without significant stenosis of the internal carotid artery relative to the  distal vessel. The cervical ICA is otherwise normal.  The left common carotid artery is within normal limits. The bifurcation is unremarkable. The cervical left ICA is mildly tortuous without focal stenosis.  The soft tissues of the neck are unremarkable. The salivary glands are intact. The lung apices demonstrate pleural blebs and mild atelectasis.  Review of the MIP images  confirms the above findings.  IMPRESSION: 1. No evidence for intracranial aneurysm. 2. Scattered white matter hypoattenuation is again noted without significant change. The finding is nonspecific but can be seen in the setting of chronic microvascular ischemia, a demyelinating process such as multiple sclerosis, vasculitis, complicated migraine headaches, or as the sequelae of a prior infectious or inflammatory process. 3. Minimal atherosclerotic changes are present at the left carotid bifurcation and bilateral cavernous carotid arteries without significant stenosis.   Electronically Signed   By: Lawrence Santiago M.D.   On: 09/02/2013 12:49   Dg Chest Portable 1 View  09/02/2013   CLINICAL DATA:  Weakness and dizziness ; hypertension  EXAM: PORTABLE CHEST - 1 VIEW  COMPARISON:  August 31, 2013  FINDINGS: Lungs are clear. Heart size and pulmonary vascularity are normal. No adenopathy. No bone lesions.  IMPRESSION: No edema or consolidation.   Electronically Signed   By: Lowella Grip M.D.   On: 09/02/2013 12:00    Assessment: 58 y.o. female presenting with complaints of difficulty with vision, dizziness and right sided numbness and weakness.  Patient is outside the treatment window for tPA.  Head CT reviewed and show no acute changes.  CTA reviewed as well and shows no significant areas of stenosis or occlusion.  Patient with history of multiple vascular risk factors.  Is not taking her ASA.    Stroke Risk Factors - diabetes mellitus, hyperlipidemia, hypertension and smoking  Plan: 1. HgbA1c, fasting lipid panel 2. MRI of the brain without contrast 3. PT consult, OT consult, Speech consult 4. Echocardiogram 5. Carotid dopplers 6. Prophylactic therapy-Antiplatelet med: Aspirin - dose 325mg  daily 7. Risk factor modification 8. Telemetry monitoring 9. Frequent neuro checks   Alexis Goodell, MD [facility] [phone] 09/02/2013, 1:49 PM

## 2013-09-02 NOTE — Progress Notes (Signed)
  Echocardiogram 2D Echocardiogram has been performed.  [place] 09/02/2013, 6:03 PM

## 2013-09-03 ENCOUNTER — Observation Stay (HOSPITAL_COMMUNITY): Payer: PRIVATE HEALTH INSURANCE

## 2013-09-03 ENCOUNTER — Other Ambulatory Visit: Payer: Self-pay

## 2013-09-03 DIAGNOSIS — R209 Unspecified disturbances of skin sensation: Secondary | ICD-10-CM

## 2013-09-03 LAB — GLUCOSE, CAPILLARY
GLUCOSE-CAPILLARY: 294 mg/dL — AB (ref 70–99)
GLUCOSE-CAPILLARY: 297 mg/dL — AB (ref 70–99)
Glucose-Capillary: 65 mg/dL — ABNORMAL LOW (ref 70–99)
Glucose-Capillary: 203 mg/dL — ABNORMAL HIGH (ref 70–99)

## 2013-09-03 LAB — LDL CHOLESTEROL, DIRECT: Direct LDL: 183 mg/dL — ABNORMAL HIGH

## 2013-09-03 LAB — BASIC METABOLIC PANEL
BUN: 13 mg/dL (ref 6–23)
BUN: 10 mg/dL (ref 6–23)
CHLORIDE: 106 meq/L (ref 96–112)
CHLORIDE: 102 meq/L (ref 96–112)
CO2: 18 mEq/L — ABNORMAL LOW (ref 19–32)
CO2: 20 mEq/L (ref 19–32)
Calcium: 9.1 mg/dL (ref 8.4–10.5)
Calcium: 9.1 mg/dL (ref 8.4–10.5)
Creatinine, Ser: 1.08 mg/dL (ref 0.50–1.10)
Creatinine, Ser: 1.11 mg/dL — ABNORMAL HIGH (ref 0.50–1.10)
GFR calc Af Amer: 64 mL/min — ABNORMAL LOW (ref >90)
GFR calc non Af Amer: 57 mL/min — ABNORMAL LOW (ref >90)
GFR, EST AFRICAN AMERICAN: 66 mL/min — AB (ref >90)
GFR, EST NON AFRICAN AMERICAN: 55 mL/min — AB (ref >90)
Glucose, Bld: 329 mg/dL — ABNORMAL HIGH (ref 70–99)
Glucose, Bld: 305 mg/dL — ABNORMAL HIGH (ref 70–99)
Potassium: 4.3 mEq/L (ref 3.7–5.3)
Potassium: 4.3 mEq/L (ref 3.7–5.3)
SODIUM: 139 meq/L (ref 137–147)
Sodium: 143 mEq/L (ref 137–147)

## 2013-09-03 LAB — TROPONIN I

## 2013-09-03 LAB — LIPID PANEL
CHOLESTEROL: 268 mg/dL — AB (ref 0–200)
HDL: 36 mg/dL — ABNORMAL LOW (ref >39)
LDL CALC: UNDETERMINED mg/dL (ref 0–99)
Total CHOL/HDL Ratio: 7.4 RATIO
Triglycerides: 423 mg/dL — ABNORMAL HIGH (ref <150)
VLDL: UNDETERMINED mg/dL (ref 0–40)

## 2013-09-03 LAB — HEMOGLOBIN A1C
Hgb A1c MFr Bld: 15.5 % — ABNORMAL HIGH (ref <5.7)
Mean Plasma Glucose: 398 mg/dL — ABNORMAL HIGH (ref <117)

## 2013-09-03 LAB — MAGNESIUM: MAGNESIUM: 1.9 mg/dL (ref 1.5–2.5)

## 2013-09-03 LAB — TSH: TSH: 77.32 u[IU]/mL — ABNORMAL HIGH (ref 0.350–4.500)

## 2013-09-03 MED ORDER — GLUCERNA SHAKE PO LIQD
237.0000 mL | ORAL | Status: DC
  Administered 2013-09-03: 237 mL via ORAL

## 2013-09-03 MED ORDER — LISINOPRIL 40 MG PO TABS: 40.0000 mg | ORAL_TABLET | Freq: Every day | ORAL | Status: DC

## 2013-09-03 MED ORDER — INSULIN GLARGINE 100 UNIT/ML ~~LOC~~ SOLN
7.0000 [IU] | Freq: Every day | SUBCUTANEOUS | Status: DC
  Filled 2013-09-03: qty 0.07

## 2013-09-03 MED ORDER — ADULT MULTIVITAMIN W/MINERALS CH
1.0000 | ORAL_TABLET | Freq: Every day | ORAL | Status: DC
  Administered 2013-09-03: 1 via ORAL
  Filled 2013-09-03: qty 1

## 2013-09-03 MED ORDER — INSULIN GLARGINE 100 UNIT/ML ~~LOC~~ SOLN: 10.0000 [IU] | Freq: Every day | SUBCUTANEOUS | Status: DC

## 2013-09-03 MED ORDER — LEVOTHYROXINE SODIUM 50 MCG PO TABS: 50.0000 ug | ORAL_TABLET | Freq: Every day | ORAL | Status: DC

## 2013-09-03 MED ORDER — LEVOTHYROXINE SODIUM 50 MCG PO TABS
50.0000 ug | ORAL_TABLET | Freq: Every day | ORAL | Status: DC
  Filled 2013-09-03: qty 1

## 2013-09-03 MED ORDER — LISINOPRIL 40 MG PO TABS
40.0000 mg | ORAL_TABLET | Freq: Every day | ORAL | Status: DC
  Administered 2013-09-03: 40 mg via ORAL
  Filled 2013-09-03: qty 1

## 2013-09-03 MED ORDER — AMLODIPINE BESYLATE 5 MG PO TABS
5.0000 mg | ORAL_TABLET | Freq: Every day | ORAL | Status: DC
  Administered 2013-09-03: 5 mg via ORAL
  Filled 2013-09-03: qty 1

## 2013-09-03 MED ORDER — ASPIRIN 325 MG PO TBEC: 325.0000 mg | DELAYED_RELEASE_TABLET | Freq: Every day | ORAL | Status: DC

## 2013-09-03 NOTE — Progress Notes (Addendum)
Pt has discharge orders. IV discontinued. Discharge instruction provided, pt verbalized understanding. Discharged pt via wheelchair with Nurse Joanne Chars and pt significant other. Pt stable and not in any acute distress.

## 2013-09-03 NOTE — Progress Notes (Signed)
INITIAL NUTRITION ASSESSMENT  DOCUMENTATION CODES Per approved criteria  -Obesity Unspecified   INTERVENTION: Provide Glucerna Shake once daily Encourage adequate healthful PO intake RD to continue to monitor  NUTRITION DIAGNOSIS: Inadequate oral intake related to life stress as evidenced by 5% weight loss in 3 months.   Goal: Pt to meet >/= 90% of their estimated nutrition needs   Monitor:  PO intake, weight trend, labs  Reason for Assessment: Malnutrition Screening Tool, score of 2  58 y.o. female  Admitting Dx: <principal problem not specified>  ASSESSMENT: 58yo AAF with PMH hypothyroidism, T2DM, HTN, bipolar d/o and schizophrenia, cocaine abuse, HLD, depression/anxiety, HLD, COPD, dysphagia w/ known schatzki's ring s/p dilation in 07/2012 who presents with c/o generalized weakness that progressed to R sided weakness (both arm and leg). Patient notes that this morning when she woke up she felt fatigued and as though her whole body was weak. At around 10am she began having R leg tingling/weakness. In the ED, VSS. She was hypokalemic to 3.3. CBC and coags wnl. Trop negative x 1. Head and neck CTA no acute process. UDS + cocaine (pt admits to using cocaine 2 nights ago).   Pt states that her appetite is poor. She picked at her breakfast but, didn't eat much. She reports having a decreased appetite for the past few months due to life stress. Weight history shows 5% weight loss in less than 3 months- not significant. Per chart review pt has received both diabetic and low fat diet education during previous hospitalizations. Pt reports receiving diet education in the past and denies and additional nutrition education needs at this time. Pt asking for salt substitute. Discussed alternative ways to flavor her food without salt or salt substitute.  Per documentation at previous admission pt was consuming 100% of her meals.   Hemoglobin A1c in process Glucose ranging 62 to 329 mg/dL Very  High Triglycerides at 423 mg/dL Decreased GFR  Height: Ht Readings from Last 1 Encounters:  09/02/13 5\' 2"  (1.575 m)    Weight: Wt Readings from Last 1 Encounters:  09/02/13 181 lb (82.101 kg)    Ideal Body Weight: 110 lbs  % Ideal Body Weight: 165%  Wt Readings from Last 10 Encounters:  09/02/13 181 lb (82.101 kg)  08/18/13 185 lb 1.6 oz (83.961 kg)  08/12/13 184 lb (83.462 kg)  08/11/13 184 lb 6.4 oz (83.643 kg)  07/21/13 186 lb 12.8 oz (84.732 kg)  06/22/13 190 lb 1.6 oz (86.229 kg)  05/12/13 188 lb 1.6 oz (85.322 kg)  04/09/13 198 lb (89.812 kg)  04/08/13 190 lb (86.183 kg)  04/06/13 188 lb (85.276 kg)    Usual Body Weight: 190 lbs  % Usual Body Weight: 95%  BMI:  Body mass index is 33.1 kg/(m^2). (Obese)  Estimated Nutritional Needs: Kcal: 1500-1700 Protein: 85-95 grams Fluid: 2 L/day  Skin: intact  Diet Order: Cardiac  EDUCATION NEEDS: -No education needs identified at this time  No intake or output data in the 24 hours ending 09/03/13 1032  Last BM: 4/8   Labs:   Recent Labs Lab 08/31/13 1118 09/02/13 1150 09/02/13 1158 09/03/13 0150  NA 137 141 143 143  K 4.0 3.3* 3.1* 4.3  CL 98 104 106 106  CO2 22 24  --  18*  BUN 16 13 12 13   CREATININE 0.98 1.06 1.20* 1.08  CALCIUM 9.2 9.4  --  9.1  MG  --   --   --  1.9  GLUCOSE 449* 62*  64* 329*    CBG (last 3)   Recent Labs  09/02/13 1708 09/02/13 2135 09/03/13 0634  GLUCAP 94 298* 203*    Scheduled Meds: . aspirin EC  325 mg Oral Daily  . atorvastatin  80 mg Oral BH-q7a  . brimonidine  1 drop Both Eyes BID  . divalproex  1,000 mg Oral QHS  . gabapentin  300 mg Oral TID  . gemfibrozil  600 mg Oral BID AC  . heparin  5,000 Units Subcutaneous 3 times per day  . insulin aspart  0-15 Units Subcutaneous TID WC  . latanoprost  1 drop Both Eyes QHS  . levothyroxine  25 mcg Oral QAC breakfast  . mometasone-formoterol  2 puff Inhalation BID  . pantoprazole  40 mg Oral Daily  .  sertraline  100 mg Oral Daily    Continuous Infusions:   Past Medical History  Diagnosis Date  . Asthma     past 10 years  . Diabetes mellitus     past 5 years  . Glaucoma     dx 06/2011-[facility]  . Hypertension   . Hyperlipidemia   . Hyperthyroidism   . History of TIA (transient ischemic attack)     01/06/2011, x2  . Pancreatitis   . Gout   . Depression   . Renal disorder     chronic kidney disease, stage 3  . Chronic pain   . Glaucoma   . Arthritis     djd -lower back, left knee  . Degenerative disc disease   . Bipolar disorder   . Gastritis   . COPD (chronic obstructive pulmonary disease)   . Schizophrenia   . 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 Fatty liver     mild CT 06/2012  . DKA (diabetic ketoacidoses) 07/21/2013  . Stroke     TIAs  . Esophageal ring, acquired 08/12/2013    Past Surgical History  Procedure Laterality Date  . Abdominal hysterectomy  1990    still has ovaries   . Colonoscopy      Pryor Ochoa RD, LDN Inpatient Clinical Dietitian Pager: [phone] After Hours Pager: [phone]

## 2013-09-03 NOTE — Discharge Instructions (Signed)
Please take aspirin 325mg  daily.  Please increase your synthroid to 80mcg daily.  Please decrease your lantus to 10U each night. Continue taking your novolog as previously prescribed.   STOP taking advair.  Weakness Weakness is a lack of strength. It may be felt all over the body (generalized) or in one specific part of the body (focal). Some causes of weakness can be serious. You may need further medical evaluation, especially if you are elderly or you have a history of immunosuppression (such as chemotherapy or HIV), kidney disease, heart disease, or diabetes. CAUSES  Weakness can be caused by many different things, including:  Infection.  Physical exhaustion.  Internal bleeding or other blood loss that results in a lack of red blood cells (anemia).  Dehydration. This cause is more common in elderly people.  Side effects or electrolyte abnormalities from medicines, such as pain medicines or sedatives.  Emotional distress, anxiety, or depression.  Circulation problems, especially severe peripheral arterial disease.  Heart disease, such as rapid atrial fibrillation, bradycardia, or heart failure.  Nervous system disorders, such as Guillain-Barr syndrome, multiple sclerosis, or stroke. DIAGNOSIS  To find the cause of your weakness, your caregiver will take your history and perform a physical exam. Lab tests or X-rays may also be ordered, if needed. TREATMENT  Treatment of weakness depends on the cause of your symptoms and can vary greatly. HOME CARE INSTRUCTIONS   Rest as needed.  Eat a well-balanced diet.  Try to get some exercise every day.  Only take over-the-counter or prescription medicines as directed by your caregiver. SEEK MEDICAL CARE IF:   Your weakness seems to be getting worse or spreads to other parts of your body.  You develop new aches or pains. SEEK IMMEDIATE MEDICAL CARE IF:   You cannot perform your normal daily activities, such as getting dressed  and feeding yourself.  You cannot walk up and down stairs, or you feel exhausted when you do so.  You have shortness of breath or chest pain.  You have difficulty moving parts of your body.  You have weakness in only one area of the body or on only one side of the body.  You have a fever.  You have trouble speaking or swallowing.  You cannot control your bladder or bowel movements.  You have black or bloody vomit or stools. MAKE SURE YOU:  Understand these instructions.  Will watch your condition.  Will get help right away if you are not doing well or get worse. Document Released: 05/14/2005 Document Revised: 11/13/2011 Document Reviewed: 07/13/2011 [facility] Patient Information 2014 [place].

## 2013-09-03 NOTE — Progress Notes (Signed)
Subjective: Patient feels her left eye is more blurry than her right but this has been present for 3 weeks. Right leg still feels as though it is tingling.   Objective: Current vital signs: BP 157/77  Pulse 63  Temp(Src) 98.3 F (36.8 C) (Oral)  Resp 18  Ht 5\' 2"  (1.575 m)  Wt 82.101 kg (181 lb)  BMI 33.10 kg/m2  SpO2 98% Vital signs in last 24 hours: Temp:  [97.3 F (36.3 C)-98.7 F (37.1 C)] 98.3 F (36.8 C) (04/09 1038) Pulse Rate:  [52-88] 63 (04/09 1038) Resp:  [18] 18 (04/09 0220) BP: (123-157)/(60-80) 157/77 mmHg (04/09 1038) SpO2:  [93 %-100 %] 98 % (04/09 1038) Weight:  [82.101 kg (181 lb)] 82.101 kg (181 lb) (04/08 1618)  Intake/Output from previous day:   Intake/Output this shift:   Nutritional status: Cardiac  Neurologic Exam:  Mental Status:  Alert, oriented, thought content appropriate. Speech fluent without evidence of aphasia. Able to follow 3 step commands without difficulty.  Cranial Nerves:  II: Discs flat bilaterally; Reports decreased vision in all visual fields from the left eye sparing inferior fields. Vision decreased superiorly in the right eye. Pupils equal, round, reactive to light and accommodation  III,IV, VI: ptosis not present, extra-ocular motions intact bilaterally  V,VII: smile symmetric, facial light touch sensation decreased on the right and continues to split the midline  VIII: hearing normal bilaterally  IX,X: gag reflex present  XI: bilateral shoulder shrug  XII: midline tongue extension  Motor:  Right :  Upper extremity 5/5  Left:  Upper extremity 5/5   Lower extremity 5/5   Lower extremity 5/5  Tone and bulk:normal tone throughout; no atrophy noted  Sensory: Pinprick and light touch decreased on the right splitting the midline in upper chest Deep Tendon Reflexes: 2+ throughout with absent AJ's bilaterally  Plantars:  Right: downgoing   Left: downgoing  Cerebellar:  normal finger-to-nose and normal heel-to-shin test  Gait:  Unable to test  CV: pulses palpable throughout    Lab Results: Basic Metabolic Panel:  Recent Labs Lab 08/31/13 1118 09/02/13 1150 09/02/13 1158 09/03/13 0150  NA 137 141 143 143  K 4.0 3.3* 3.1* 4.3  CL 98 104 106 106  CO2 22 24  --  18*  GLUCOSE 449* 62* 64* 329*  BUN 16 13 12 13   CREATININE 0.98 1.06 1.20* 1.08  CALCIUM 9.2 9.4  --  9.1  MG  --   --   --  1.9    Liver Function Tests:  Recent Labs Lab 09/02/13 1150  AST 22  ALT 14  ALKPHOS 138*  BILITOT 0.3  PROT 6.9  ALBUMIN 3.4*    Recent Labs Lab 09/02/13 1910  LIPASE 60*   No results found for this basename: AMMONIA,  in the last 168 hours  CBC:  Recent Labs Lab 08/31/13 1118 09/02/13 1150 09/02/13 1158  WBC 6.5 7.6  --   NEUTROABS  --  4.8  --   HGB 14.1 13.5 15.3*  HCT 41.5 40.0 45.0  MCV 86.1 85.3  --   PLT 305 296  --     Cardiac Enzymes:  Recent Labs Lab 09/02/13 1551 09/02/13 1910 09/03/13 0150  TROPONINI <0.30 <0.30 <0.30    Lipid Panel:  Recent Labs Lab 09/03/13 0150  CHOL 268*  TRIG 423*  HDL 36*  CHOLHDL 7.4  VLDL UNABLE TO CALCULATE IF TRIGLYCERIDE OVER 400 mg/dL  LDLCALC UNABLE TO CALCULATE IF TRIGLYCERIDE OVER 400 mg/dL  CBG:  Recent Labs Lab 09/02/13 1401 09/02/13 1631 09/02/13 1708 09/02/13 2135 09/03/13 0634  GLUCAP 119* 65* 94 298* 203*    Microbiology: Results for orders placed during the hospital encounter of 07/20/13  MRSA PCR SCREENING     Status: None   Collection Time    07/20/13  6:08 PM      Result Value Ref Range Status   MRSA by PCR NEGATIVE  NEGATIVE Final   Comment:            The GeneXpert MRSA Assay (FDA     approved for NASAL specimens     only), is one component of a     comprehensive MRSA colonization     surveillance program. It is not     intended to diagnose MRSA     infection nor to guide or     monitor treatment for     MRSA infections.    Coagulation Studies:  Recent Labs  09/02/13 1150  LABPROT 12.8   INR 0.98    Imaging: Ct Angio Head W/cm &/or Wo Cm  09/02/2013   CLINICAL DATA:  Right leg weakness. Blurred vision in the left eye. Dizziness. Question aneurysm.  EXAM: CT ANGIOGRAPHY HEAD AND NECK  TECHNIQUE: Multidetector CT imaging of the head and neck was performed using the standard protocol during bolus administration of intravenous contrast. Multiplanar CT image reconstructions and MIPs were obtained to evaluate the vascular anatomy. Carotid stenosis measurements (when applicable) are obtained utilizing NASCET criteria, using the distal internal carotid diameter as the denominator.  CONTRAST:  110mL OMNIPAQUE IOHEXOL 350 MG/ML SOLN  COMPARISON:  CT head without contrast 08/17/2013  FINDINGS: CTA HEAD FINDINGS  Mild white matter changes are stable. No acute cortical infarct, hemorrhage, or mass lesion is present. The ventricles are of normal size. No significant extra-axial fluid collection is present.  The paranasal sinuses and mastoid air cells are clear. A remote left orbital blowout fracture is again seen.  The postcontrast images demonstrate no pathologic enhancement.  Intracranial internal carotid arteries demonstrate mild atherosclerotic irregularity within the cavernous segments bilaterally without significant stenosis. The ICA terminus is within normal limits bilaterally. The left A1 segment is dominant to the right. The anterior communicating artery is patent. The M1 segments and MCA bifurcations are intact bilaterally. The MCA branch vessels are within normal limits bilaterally.  The left vertebral artery is the dominant vessel. The right vertebral artery essentially terminates at the PICA ascending a very small branch to the vertebrobasilar junction. The basilar artery is small with fetal type posterior cerebral arteries bilaterally. A small left P1 segment is present.  The PCA branch vessels are within normal limits.  Review of the MIP images confirms the above findings.  CTA NECK FINDINGS   A standard 3 vessel arch configuration is present. The vertebral arteries both originate from the subclavian arteries. The left vertebral artery is dominant. The origin of the left vertebral artery is obscured by an adjacent vein. There is no significant stenosis within the vertebral arteries of the neck.  The right common carotid artery is within normal limits. There is a focal calcification at the right carotid bifurcation without significant stenosis of the internal carotid artery relative to the distal vessel. The cervical ICA is otherwise normal.  The left common carotid artery is within normal limits. The bifurcation is unremarkable. The cervical left ICA is mildly tortuous without focal stenosis.  The soft tissues of the neck are unremarkable. The salivary glands are  intact. The lung apices demonstrate pleural blebs and mild atelectasis.  Review of the MIP images confirms the above findings.  IMPRESSION: 1. No evidence for intracranial aneurysm. 2. Scattered white matter hypoattenuation is again noted without significant change. The finding is nonspecific but can be seen in the setting of chronic microvascular ischemia, a demyelinating process such as multiple sclerosis, vasculitis, complicated migraine headaches, or as the sequelae of a prior infectious or inflammatory process. 3. Minimal atherosclerotic changes are present at the left carotid bifurcation and bilateral cavernous carotid arteries without significant stenosis.   Electronically Signed   By: Lawrence Santiago M.D.   On: 09/02/2013 12:49   Ct Angio Neck W/cm &/or Wo/cm  09/02/2013   CLINICAL DATA:  Right leg weakness. Blurred vision in the left eye. Dizziness. Question aneurysm.  EXAM: CT ANGIOGRAPHY HEAD AND NECK  TECHNIQUE: Multidetector CT imaging of the head and neck was performed using the standard protocol during bolus administration of intravenous contrast. Multiplanar CT image reconstructions and MIPs were obtained to evaluate the vascular  anatomy. Carotid stenosis measurements (when applicable) are obtained utilizing NASCET criteria, using the distal internal carotid diameter as the denominator.  CONTRAST:  141mL OMNIPAQUE IOHEXOL 350 MG/ML SOLN  COMPARISON:  CT head without contrast 08/17/2013  FINDINGS: CTA HEAD FINDINGS  Mild white matter changes are stable. No acute cortical infarct, hemorrhage, or mass lesion is present. The ventricles are of normal size. No significant extra-axial fluid collection is present.  The paranasal sinuses and mastoid air cells are clear. A remote left orbital blowout fracture is again seen.  The postcontrast images demonstrate no pathologic enhancement.  Intracranial internal carotid arteries demonstrate mild atherosclerotic irregularity within the cavernous segments bilaterally without significant stenosis. The ICA terminus is within normal limits bilaterally. The left A1 segment is dominant to the right. The anterior communicating artery is patent. The M1 segments and MCA bifurcations are intact bilaterally. The MCA branch vessels are within normal limits bilaterally.  The left vertebral artery is the dominant vessel. The right vertebral artery essentially terminates at the PICA ascending a very small branch to the vertebrobasilar junction. The basilar artery is small with fetal type posterior cerebral arteries bilaterally. A small left P1 segment is present.  The PCA branch vessels are within normal limits.  Review of the MIP images confirms the above findings.  CTA NECK FINDINGS  A standard 3 vessel arch configuration is present. The vertebral arteries both originate from the subclavian arteries. The left vertebral artery is dominant. The origin of the left vertebral artery is obscured by an adjacent vein. There is no significant stenosis within the vertebral arteries of the neck.  The right common carotid artery is within normal limits. There is a focal calcification at the right carotid bifurcation without  significant stenosis of the internal carotid artery relative to the distal vessel. The cervical ICA is otherwise normal.  The left common carotid artery is within normal limits. The bifurcation is unremarkable. The cervical left ICA is mildly tortuous without focal stenosis.  The soft tissues of the neck are unremarkable. The salivary glands are intact. The lung apices demonstrate pleural blebs and mild atelectasis.  Review of the MIP images confirms the above findings.  IMPRESSION: 1. No evidence for intracranial aneurysm. 2. Scattered white matter hypoattenuation is again noted without significant change. The finding is nonspecific but can be seen in the setting of chronic microvascular ischemia, a demyelinating process such as multiple sclerosis, vasculitis, complicated migraine headaches, or as the  sequelae of a prior infectious or inflammatory process. 3. Minimal atherosclerotic changes are present at the left carotid bifurcation and bilateral cavernous carotid arteries without significant stenosis.   Electronically Signed   By: Lawrence Santiago M.D.   On: 09/02/2013 12:49   Mr Brain Wo Contrast  09/03/2013   CLINICAL DATA:  Right-sided weakness. Stroke risk factors include diabetes, hypertension, hyperlipidemia, and smoking.  EXAM: MRI HEAD WITHOUT CONTRAST  TECHNIQUE: Multiplanar, multiecho pulse sequences of the brain and surrounding structures were obtained without intravenous contrast.  COMPARISON:  CTA head 09/02/2013.  MR brain 02/02/2012.  FINDINGS: No evidence for acute infarction, hemorrhage, mass lesion, hydrocephalus, or extra-axial fluid. Slight premature for age cerebral and cerebellar atrophy. Mild subcortical and periventricular T2 and FLAIR hyperintensities, likely chronic microvascular ischemic change. Remote right basal ganglia lacunar infarct. Flow voids are maintained throughout the carotid, basilar, and vertebral arteries. There are no areas of chronic hemorrhage. Partial empty sella. No  tonsillar herniation. Visualized calvarium, skull base, and upper cervical osseous structures unremarkable. Scalp and extracranial soft tissues, orbits, sinuses, and mastoids show no acute process.  Compared with priors, good general agreement.  IMPRESSION: Mild atrophy and small vessel disease. Partial empty sella. No acute intracranial findings.   Electronically Signed   By: Rolla Flatten M.D.   On: 09/03/2013 10:04   Dg Chest Portable 1 View  09/02/2013   CLINICAL DATA:  Weakness and dizziness ; hypertension  EXAM: PORTABLE CHEST - 1 VIEW  COMPARISON:  August 31, 2013  FINDINGS: Lungs are clear. Heart size and pulmonary vascularity are normal. No adenopathy. No bone lesions.  IMPRESSION: No edema or consolidation.   Electronically Signed   By: Lowella Grip M.D.   On: 09/02/2013 12:00   A1c pending LDL unable to calculate  Echo: Study Conclusions  - Left ventricle: The cavity size was normal. There was mild focal basal hypertrophy of the septum. Systolic function was normal. The estimated ejection fraction was in the range of 55% to 60%. Wall motion was normal; there were no regional wall motion abnormalities. There was an increased relative contribution of atrial contraction to ventricular filling. Doppler parameters are consistent with abnormal left ventricular relaxation (grade 1 diastolic dysfunction).    Medications:  Scheduled: . amLODipine  5 mg Oral Daily  . aspirin EC  325 mg Oral Daily  . atorvastatin  80 mg Oral BH-q7a  . brimonidine  1 drop Both Eyes BID  . divalproex  1,000 mg Oral QHS  . feeding supplement (GLUCERNA SHAKE)  237 mL Oral Q24H  . gabapentin  300 mg Oral TID  . gemfibrozil  600 mg Oral BID AC  . heparin  5,000 Units Subcutaneous 3 times per day  . insulin aspart  0-15 Units Subcutaneous TID WC  . latanoprost  1 drop Both Eyes QHS  . levothyroxine  25 mcg Oral QAC breakfast  . lisinopril  40 mg Oral Daily  . mometasone-formoterol  2 puff Inhalation  BID  . multivitamin with minerals  1 tablet Oral Daily  . pantoprazole  40 mg Oral Daily  . sertraline  100 mg Oral Daily    Assessment/Plan: Patient has not returned to baseline.  MRI of the brain reviewed and shows no acute changes.  Echocardiogram unremarkable.  Acute ischemic disease unlikely.    Recommendations: 1.  Continue ASA daily 2.  Patient to see ophthalmology concerning vision.      LOS: 1 day   Alexis Goodell, MD [facility] [phone]  09/03/2013  1:02 PM

## 2013-09-03 NOTE — Progress Notes (Signed)
VASCULAR LAB PRELIMINARY  PRELIMINARY  PRELIMINARY  PRELIMINARY  Carotid duplex completed.    Preliminary report:  Bilateral:  1-39% ICA stenosis lower end of range.  Vertebral artery flow is antegrade.     [place], [place] 09/03/2013, 4:23 PM

## 2013-09-03 NOTE — Progress Notes (Addendum)
Subjective: Patient has some improvement of her weakness, though she still feels as though her R side is weaker than her L (both armand leg). No SOB, N/V/D, HA. She has had some of the same chest/epigastric pain that is unchanged from yesterday.   Objective: Vital signs in last 24 hours: Filed Vitals:   09/03/13 0657 09/03/13 0658 09/03/13 0700 09/03/13 1038  BP: 139/60 135/63 136/74 157/77  Pulse: 52 55 88 63  Temp:    98.3 F (36.8 C)  TempSrc:    Oral  Resp:      Height:      Weight:      SpO2: 97% 100% 100% 98%   Weight change:  No intake or output data in the 24 hours ending 09/03/13 1057  Physical Exam General: alert, cooperative, and in no apparent distress HEENT: pupils equal round and reactive to light, vision grossly intact, oropharynx clear and non-erythematous  Neck: supple Lungs: clear to ascultation bilaterally, normal work of respiration, no wheezes, rales, ronchi; +chest wall TTP Heart: regular rate and rhythm, no murmurs, gallops, or rubs Abdomen: soft, mild TTP epigastrium, non-distended, normal bowel sounds  Extremities: warm, no pedal edema; strong DP pulses bilaterally Neurologic: alert & oriented X3, cranial nerves II-XII intact, strength 5/5 throughout (pt still with variable effort on both R and L arm/leg), sensation intact to light touch, though diminished slightly RLE; she is able to move around in bed without difficulty and moves all extremities spontaneously   Lab Results: Basic Metabolic Panel:  Recent Labs Lab 09/02/13 1150 09/02/13 1158 09/03/13 0150  NA 141 143 143  K 3.3* 3.1* 4.3  CL 104 106 106  CO2 24  --  18*  GLUCOSE 62* 64* 329*  BUN 13 12 13   CREATININE 1.06 1.20* 1.08  CALCIUM 9.4  --  9.1  MG  --   --  1.9   Liver Function Tests:  Recent Labs Lab 09/02/13 1150  AST 22  ALT 14  ALKPHOS 138*  BILITOT 0.3  PROT 6.9  ALBUMIN 3.4*    Recent Labs Lab 09/02/13 1910  LIPASE 60*   CBC:  Recent Labs Lab  08/31/13 1118 09/02/13 1150 09/02/13 1158  WBC 6.5 7.6  --   NEUTROABS  --  4.8  --   HGB 14.1 13.5 15.3*  HCT 41.5 40.0 45.0  MCV 86.1 85.3  --   PLT 305 296  --    Cardiac Enzymes:  Recent Labs Lab 09/02/13 1551 09/02/13 1910 09/03/13 0150  TROPONINI <0.30 <0.30 <0.30  CBG:  Recent Labs Lab 09/02/13 1254 09/02/13 1401 09/02/13 1631 09/02/13 1708 09/02/13 2135 09/03/13 0634  GLUCAP 163* 119* 65* 94 298* 203*   Fasting Lipid Panel:  Recent Labs Lab 09/03/13 0150  CHOL 268*  HDL 36*  LDLCALC UNABLE TO CALCULATE IF TRIGLYCERIDE OVER 400 mg/dL  TRIG 423*  CHOLHDL 7.4   Thyroid Function Tests:  Recent Labs Lab 09/03/13 0150  TSH 77.320*   Coagulation:  Recent Labs Lab 09/02/13 1150  LABPROT 12.8  INR 0.98   Urine Drug Screen: Drugs of Abuse     Component Value Date/Time   LABOPIA NONE DETECTED 09/02/2013 1239   LABOPIA NEG 01/23/2013 1507   COCAINSCRNUR POSITIVE* 09/02/2013 1239   COCAINSCRNUR NEG 01/23/2013 1507   LABBENZ NONE DETECTED 09/02/2013 1239   LABBENZ NEG 01/23/2013 1507   AMPHETMU NONE DETECTED 09/02/2013 1239   THCU NONE DETECTED 09/02/2013 1239   LABBARB NONE DETECTED 09/02/2013 1239  LABBARB NEG 01/23/2013 1507    Alcohol Level:  Recent Labs Lab 09/02/13 1150  ETH <11   Urinalysis:  Recent Labs Lab 08/31/13 1309 09/02/13 1239  COLORURINE YELLOW YELLOW  LABSPEC 1.036* 1.017  PHURINE 5.5 5.5  GLUCOSEU >1000* NEGATIVE  HGBUR NEGATIVE NEGATIVE  BILIRUBINUR NEGATIVE NEGATIVE  KETONESUR NEGATIVE NEGATIVE  PROTEINUR NEGATIVE NEGATIVE  UROBILINOGEN 0.2 0.2  NITRITE NEGATIVE NEGATIVE  LEUKOCYTESUR SMALL* NEGATIVE   Studies/Results: Ct Angio Head W/cm &/or Wo Cm  09/02/2013   CLINICAL DATA:  Right leg weakness. Blurred vision in the left eye. Dizziness. Question aneurysm.  EXAM: CT ANGIOGRAPHY HEAD AND NECK  TECHNIQUE: Multidetector CT imaging of the head and neck was performed using the standard protocol during bolus administration  of intravenous contrast. Multiplanar CT image reconstructions and MIPs were obtained to evaluate the vascular anatomy. Carotid stenosis measurements (when applicable) are obtained utilizing NASCET criteria, using the distal internal carotid diameter as the denominator.  CONTRAST:  119mL OMNIPAQUE IOHEXOL 350 MG/ML SOLN  COMPARISON:  CT head without contrast 08/17/2013  FINDINGS: CTA HEAD FINDINGS  Mild white matter changes are stable. No acute cortical infarct, hemorrhage, or mass lesion is present. The ventricles are of normal size. No significant extra-axial fluid collection is present.  The paranasal sinuses and mastoid air cells are clear. A remote left orbital blowout fracture is again seen.  The postcontrast images demonstrate no pathologic enhancement.  Intracranial internal carotid arteries demonstrate mild atherosclerotic irregularity within the cavernous segments bilaterally without significant stenosis. The ICA terminus is within normal limits bilaterally. The left A1 segment is dominant to the right. The anterior communicating artery is patent. The M1 segments and MCA bifurcations are intact bilaterally. The MCA branch vessels are within normal limits bilaterally.  The left vertebral artery is the dominant vessel. The right vertebral artery essentially terminates at the PICA ascending a very small branch to the vertebrobasilar junction. The basilar artery is small with fetal type posterior cerebral arteries bilaterally. A small left P1 segment is present.  The PCA branch vessels are within normal limits.  Review of the MIP images confirms the above findings.  CTA NECK FINDINGS  A standard 3 vessel arch configuration is present. The vertebral arteries both originate from the subclavian arteries. The left vertebral artery is dominant. The origin of the left vertebral artery is obscured by an adjacent vein. There is no significant stenosis within the vertebral arteries of the neck.  The right common carotid  artery is within normal limits. There is a focal calcification at the right carotid bifurcation without significant stenosis of the internal carotid artery relative to the distal vessel. The cervical ICA is otherwise normal.  The left common carotid artery is within normal limits. The bifurcation is unremarkable. The cervical left ICA is mildly tortuous without focal stenosis.  The soft tissues of the neck are unremarkable. The salivary glands are intact. The lung apices demonstrate pleural blebs and mild atelectasis.  Review of the MIP images confirms the above findings.  IMPRESSION: 1. No evidence for intracranial aneurysm. 2. Scattered white matter hypoattenuation is again noted without significant change. The finding is nonspecific but can be seen in the setting of chronic microvascular ischemia, a demyelinating process such as multiple sclerosis, vasculitis, complicated migraine headaches, or as the sequelae of a prior infectious or inflammatory process. 3. Minimal atherosclerotic changes are present at the left carotid bifurcation and bilateral cavernous carotid arteries without significant stenosis.   Electronically Signed   By: Gerald Stabs  Mattern M.D.   On: 09/02/2013 12:49   Ct Angio Neck W/cm &/or Wo/cm  09/02/2013   CLINICAL DATA:  Right leg weakness. Blurred vision in the left eye. Dizziness. Question aneurysm.  EXAM: CT ANGIOGRAPHY HEAD AND NECK  TECHNIQUE: Multidetector CT imaging of the head and neck was performed using the standard protocol during bolus administration of intravenous contrast. Multiplanar CT image reconstructions and MIPs were obtained to evaluate the vascular anatomy. Carotid stenosis measurements (when applicable) are obtained utilizing NASCET criteria, using the distal internal carotid diameter as the denominator.  CONTRAST:  134mL OMNIPAQUE IOHEXOL 350 MG/ML SOLN  COMPARISON:  CT head without contrast 08/17/2013  FINDINGS: CTA HEAD FINDINGS  Mild white matter changes are stable. No  acute cortical infarct, hemorrhage, or mass lesion is present. The ventricles are of normal size. No significant extra-axial fluid collection is present.  The paranasal sinuses and mastoid air cells are clear. A remote left orbital blowout fracture is again seen.  The postcontrast images demonstrate no pathologic enhancement.  Intracranial internal carotid arteries demonstrate mild atherosclerotic irregularity within the cavernous segments bilaterally without significant stenosis. The ICA terminus is within normal limits bilaterally. The left A1 segment is dominant to the right. The anterior communicating artery is patent. The M1 segments and MCA bifurcations are intact bilaterally. The MCA branch vessels are within normal limits bilaterally.  The left vertebral artery is the dominant vessel. The right vertebral artery essentially terminates at the PICA ascending a very small branch to the vertebrobasilar junction. The basilar artery is small with fetal type posterior cerebral arteries bilaterally. A small left P1 segment is present.  The PCA branch vessels are within normal limits.  Review of the MIP images confirms the above findings.  CTA NECK FINDINGS  A standard 3 vessel arch configuration is present. The vertebral arteries both originate from the subclavian arteries. The left vertebral artery is dominant. The origin of the left vertebral artery is obscured by an adjacent vein. There is no significant stenosis within the vertebral arteries of the neck.  The right common carotid artery is within normal limits. There is a focal calcification at the right carotid bifurcation without significant stenosis of the internal carotid artery relative to the distal vessel. The cervical ICA is otherwise normal.  The left common carotid artery is within normal limits. The bifurcation is unremarkable. The cervical left ICA is mildly tortuous without focal stenosis.  The soft tissues of the neck are unremarkable. The salivary  glands are intact. The lung apices demonstrate pleural blebs and mild atelectasis.  Review of the MIP images confirms the above findings.  IMPRESSION: 1. No evidence for intracranial aneurysm. 2. Scattered white matter hypoattenuation is again noted without significant change. The finding is nonspecific but can be seen in the setting of chronic microvascular ischemia, a demyelinating process such as multiple sclerosis, vasculitis, complicated migraine headaches, or as the sequelae of a prior infectious or inflammatory process. 3. Minimal atherosclerotic changes are present at the left carotid bifurcation and bilateral cavernous carotid arteries without significant stenosis.   Electronically Signed   By: Lawrence Santiago M.D.   On: 09/02/2013 12:49   Mr Brain Wo Contrast  09/03/2013   CLINICAL DATA:  Right-sided weakness. Stroke risk factors include diabetes, hypertension, hyperlipidemia, and smoking.  EXAM: MRI HEAD WITHOUT CONTRAST  TECHNIQUE: Multiplanar, multiecho pulse sequences of the brain and surrounding structures were obtained without intravenous contrast.  COMPARISON:  CTA head 09/02/2013.  MR brain 02/02/2012.  FINDINGS: No evidence  for acute infarction, hemorrhage, mass lesion, hydrocephalus, or extra-axial fluid. Slight premature for age cerebral and cerebellar atrophy. Mild subcortical and periventricular T2 and FLAIR hyperintensities, likely chronic microvascular ischemic change. Remote right basal ganglia lacunar infarct. Flow voids are maintained throughout the carotid, basilar, and vertebral arteries. There are no areas of chronic hemorrhage. Partial empty sella. No tonsillar herniation. Visualized calvarium, skull base, and upper cervical osseous structures unremarkable. Scalp and extracranial soft tissues, orbits, sinuses, and mastoids show no acute process.  Compared with priors, good general agreement.  IMPRESSION: Mild atrophy and small vessel disease. Partial empty sella. No acute  intracranial findings.   Electronically Signed   By: Rolla Flatten M.D.   On: 09/03/2013 10:04   Dg Chest Portable 1 View  09/02/2013   CLINICAL DATA:  Weakness and dizziness ; hypertension  EXAM: PORTABLE CHEST - 1 VIEW  COMPARISON:  August 31, 2013  FINDINGS: Lungs are clear. Heart size and pulmonary vascularity are normal. No adenopathy. No bone lesions.  IMPRESSION: No edema or consolidation.   Electronically Signed   By: Lowella Grip M.D.   On: 09/02/2013 12:00   Medications: I have reviewed the patient's current medications. Scheduled Meds: . aspirin EC  325 mg Oral Daily  . atorvastatin  80 mg Oral BH-q7a  . brimonidine  1 drop Both Eyes BID  . divalproex  1,000 mg Oral QHS  . feeding supplement (GLUCERNA SHAKE)  237 mL Oral Q24H  . gabapentin  300 mg Oral TID  . gemfibrozil  600 mg Oral BID AC  . heparin  5,000 Units Subcutaneous 3 times per day  . insulin aspart  0-15 Units Subcutaneous TID WC  . latanoprost  1 drop Both Eyes QHS  . levothyroxine  25 mcg Oral QAC breakfast  . mometasone-formoterol  2 puff Inhalation BID  . multivitamin with minerals  1 tablet Oral Daily  . pantoprazole  40 mg Oral Daily  . sertraline  100 mg Oral Daily   Continuous Infusions:  PRN Meds:.albuterol, ALPRAZolam, nitroGLYCERIN, oxyCODONE, senna-docusate  Assessment/Plan: # Generalized weakness vs TIA vs CVA: Patient's symptoms somewhat improved. She remains generally weak with R>L weakness. VSS. Neurological exam relatively nonfocal, I think most of patient's findings on exam are 2/2 lack of effort on strength exam. Troponin negative x 3, so ACS ruled out. MRI did not reveal any acute intracranial abnormality, so CVA essentially ruled out. Patient not orthostatic, so hypotension not likely the cause of her nearsyncope yesterday. At this point, I am favoring hypoglycemia to explain the events yesterday, though CBGs have been stable overnight.  -A1c pending -LDL not calculated 2/2 high TG; obtaining  direct LDL -2D echo showed EF 55-60% with grade 1 diastolic dysfunction, no intramural thrombus  -carotid dopplers not yet performed -ASA 325mg  daily -PT evaluated patient and recommended HH PT w/ 24 hr assistance -continue home xanax  -continue home lipitor 80mg  daily  -restart antihypertensive medications  # Atypical Chest pain/ epigastric pain: Likely GI in origin. ACS ruled out overnight. EKG this AM unchanged from prior. 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  -oxycodone 10mg  q4h (home regimen)  -continue home protonix 40mg  daily  -continue home lipitor 80mg  daily and gemfibrozil 600mg  BID   # Hypokalemia: Resolved. Patient with K 3.3 on admission. S/p kdur 87mEq x 1 dose, repeat K this morning 4.3 and Mg level wnl.  #  Mental health disorders: Pt with reported hx of bipolar disorder, anxiety/depression, schizophrenia. Mood appears stable. She takes depakote 1000mg  qHS, xanax .5mg  qHS prn, zoloft 100mg  daily.  -continue home regimen as above   # Hypothyroidism: Patient's last TSH 78.029 3/23, which is down from admission in February when it was 114 and her synthroid was restarted at 88mcg daily (pt had been noncompliant prior to this). Patient reports compliance with synthroid now. Per chart review, working toward up titration of her synthroid as outpatient.  -TSH 77 this admission -increase synthroid to 31mcg daily today  #HTN: Pt normotensive. Will restart home antihypertensives (lisinopril 40mg  daily and amlodipine 5mg  daily) since patient did not have CVA.  # DM type 2: Last A1c 15.3%. She reports compliance with home lantus 26U qHS and Novolog 3U with meals- though I doubt this is the case given her poor control as highlighted by A1c. CBGs 94-298 overnight. -A1c pending -holding lantus given hypoglycemia on admission - SSI mod  -continue home gabapentin 300mg   TID   # COPD: Stable no exacerbation. She takes albuterol and ? Both dulera and advair at home. This is duplicate therapy, so will plan to stop advair as this had been stopped in the past.  -continue albuterol neb prn  -continue home dulera   # VTE: heparin   # Diet: HH diet   Code status: full  Dispo: Disposition is deferred at this time, awaiting improvement of current medical problems.  Anticipated discharge in approximately 1 day(s).   The patient does have a current PCP Rebecca Eaton, MD) and does need an [facility] hospital follow-up appointment after discharge.  The patient does not have transportation limitations that hinder transportation to clinic appointments.  .Services Needed at time of discharge: Y = Yes, Blank = No PT:   OT:   RN:   Equipment:   Other:     LOS: 1 day   Rebecca Eaton, MD 09/03/2013, 10:57 AM

## 2013-09-03 NOTE — Discharge Summary (Signed)
Name: [name] MRN: [id number] DOB: Mar 04, 1956 58 y.o. PCP: Rebecca Eaton, MD  Date of Admission: 09/02/2013 10:36 AM Date of Discharge: 09/03/2013 Attending Physician: Axel Filler, MD  Discharge Diagnosis: Active Problems:   Weakness   Stroke-like symptoms   Atypical chest pain likely related to schatzki's ring  Discharge Medications:   Medication List    STOP taking these medications       Fluticasone-Salmeterol 250-50 MCG/DOSE Aepb  Commonly known as:  ADVAIR DISKUS      TAKE these medications       albuterol 108 (90 BASE) MCG/ACT inhaler  Commonly known as:  PROVENTIL HFA;VENTOLIN HFA  Inhale 2 puffs into the lungs every 6 (six) hours as needed for wheezing.     albuterol (2.5 MG/3ML) 0.083% nebulizer solution  Commonly known as:  PROVENTIL  Inhale 2.5 mg into the lungs every 6 (six) hours as needed for wheezing.     allopurinol 300 MG tablet  Commonly known as:  ZYLOPRIM  Take 300 mg by mouth daily. For gout.     ALPRAZolam 0.5 MG tablet  Commonly known as:  XANAX  Take 1 tablet (0.5 mg total) by mouth at bedtime as needed for anxiety.     amLODipine 5 MG tablet  Commonly known as:  NORVASC  Take 5 mg by mouth daily. For hypertension.     aspirin 325 MG EC tablet  Take 1 tablet (325 mg total) by mouth daily.     atorvastatin 80 MG tablet  Commonly known as:  LIPITOR  Take 80 mg by mouth every morning.     brimonidine 0.2 % ophthalmic solution  Commonly known as:  ALPHAGAN  Place 1 drop into both eyes 2 (two) times daily.     colchicine 0.6 MG tablet  Take 1 tablet (0.6 mg total) by mouth daily as needed (for gout flare-ups).     divalproex 500 MG DR tablet  Commonly known as:  DEPAKOTE  Take 1,000 mg by mouth at bedtime.     gabapentin 100 MG capsule  Commonly known as:  NEURONTIN  Take 300 mg by mouth 3 (three) times daily. Take 3 (100mg ) capsules for a dose of (300mg ) three times each day for neuropathic pain and anxiety.     gemfibrozil 600 MG tablet  Commonly known as:  LOPID  Take 600 mg by mouth 2 (two) times daily before a meal.     insulin aspart 100 UNIT/ML injection  Commonly known as:  NOVOLOG  Inject 3 Units into the skin 3 (three) times daily with meals.     insulin glargine 100 UNIT/ML injection  Commonly known as:  LANTUS  Inject 0.1 mLs (10 Units total) into the skin at bedtime.     latanoprost 0.005 % ophthalmic solution  Commonly known as:  XALATAN  Place 1 drop into both eyes at bedtime.     levothyroxine 50 MCG tablet  Commonly known as:  SYNTHROID, LEVOTHROID  Take 1 tablet (50 mcg total) by mouth daily before breakfast.  Start taking on:  09/04/2013     lisinopril 40 MG tablet  Commonly known as:  PRINIVIL,ZESTRIL  Take 1 tablet (40 mg total) by mouth daily.     mometasone-formoterol 100-5 MCG/ACT Aero  Commonly known as:  DULERA  Inhale 2 puffs into the lungs 2 (two) times daily.     nitroGLYCERIN 0.4 MG SL tablet  Commonly known as:  NITROSTAT  Place 1 tablet (0.4 mg total) under  the tongue every 5 (five) minutes as needed for chest pain.     ondansetron 8 MG disintegrating tablet  Commonly known as:  ZOFRAN ODT  Take 1 tablet (8 mg total) by mouth every 8 (eight) hours as needed for nausea or vomiting.     Oxycodone HCl 10 MG Tabs  Take 10 mg by mouth every 4 (four) hours as needed (for severepain).     pantoprazole 40 MG tablet  Commonly known as:  PROTONIX  Take 1 tablet (40 mg total) by mouth daily. For reflux.     sertraline 100 MG tablet  Commonly known as:  ZOLOFT  Take 1 tablet (100 mg total) by mouth daily.        Disposition and follow-up:   [name] was discharged from [facility] in Stable condition.  At the hospital follow up visit please address:  1.  Direct LDL 186, assess statin use 2. A1c 15.5%; doubt she is compliant with her lantus (had been 26U qhs); this was held during admission given hypoglycemia on admission but  was restarted at discharge at lower dose 3. Assess compliance with ASA 325mg  (had been on ASA 81mg  but was not taking it) 4. Hypothyroidism: TSH remains elevated at 77. Synthroid increased from 36mcg to 42mcg daily. Please make sure patient is taking this increased dose. TSH needs checked in about 1 month. 5. Did HHPT come visit patient? 6. Per neuro, patient with some blurry vision- refer to ophtho please   2.  Labs / imaging needed at time of follow-up: none; will need TSH checked in about 1 month  3.  Pending labs/ test needing follow-up: none  Follow-up Appointments: Follow-up Information   Follow up with [facility]. Schedule an appointment as soon as possible for a visit in 1 week. (please call to make an appointment within 2 weeks)    Contact information:   [address] [place] [address] [phone]      Follow up with [facility].   Contact information:   [address] [place] [address] [phone]       Follow up with Rebecca Eaton, MD On 09/07/2013. (3:15pm)    Specialty:  Internal Medicine   Contact information:   [place] [address] [phone]       Discharge Instructions: Discharge Orders   Future Appointments Provider Department Dept Phone   09/07/2013 3:15 PM Rebecca Eaton, MD [facility] [phone]   Future Orders Complete By Expires   Call MD for:  persistant dizziness or light-headedness  As directed    Call MD for:  redness, tenderness, or signs of infection (pain, swelling, redness, odor or green/yellow discharge around incision site)  As directed    Call MD for:  severe uncontrolled pain  As directed    Diet - low sodium heart healthy  As directed    Increase activity slowly  As directed       Consultations: Treatment Team:  Catarina Hartshorn, MD  Procedures Performed:  Ct Angio Head W/cm &/or Wo Cm  09/02/2013   CLINICAL  DATA:  Right leg weakness. Blurred vision in the left eye. Dizziness. Question aneurysm.  EXAM: CT ANGIOGRAPHY HEAD AND NECK  TECHNIQUE: Multidetector CT imaging of the head and neck was performed using the standard protocol during bolus administration of intravenous contrast. Multiplanar CT image reconstructions and MIPs were obtained to evaluate the vascular  anatomy. Carotid stenosis measurements (when applicable) are obtained utilizing NASCET criteria, using the distal internal carotid diameter as the denominator.  CONTRAST:  OMNIPAQUE IOHEXOL 350 MG/ML SOLN  COMPARISON:  CT head without contrast 08/17/2013  FINDINGS: CTA HEAD FINDINGS  Mild white matter changes are stable. No acute cortical infarct, hemorrhage, or mass lesion is present. The ventricles are of normal size. No significant extra-axial fluid collection is present.  The paranasal sinuses and mastoid air cells are clear. A remote left orbital blowout fracture is again seen.  The postcontrast images demonstrate no pathologic enhancement.  Intracranial internal carotid arteries demonstrate mild atherosclerotic irregularity within the cavernous segments bilaterally without significant stenosis. The ICA terminus is within normal limits bilaterally. The left A1 segment is dominant to the right. The anterior communicating artery is patent. The M1 segments and MCA bifurcations are intact bilaterally. The MCA branch vessels are within normal limits bilaterally.  The left vertebral artery is the dominant vessel. The right vertebral artery essentially terminates at the PICA ascending a very small branch to the vertebrobasilar junction. The basilar artery is small with fetal type posterior cerebral arteries bilaterally. A small left P1 segment is present.  The PCA branch vessels are within normal limits.  Review of the MIP images confirms the above findings.  CTA NECK FINDINGS  A standard 3 vessel arch configuration is present. The vertebral arteries both  originate from the subclavian arteries. The left vertebral artery is dominant. The origin of the left vertebral artery is obscured by an adjacent vein. There is no significant stenosis within the vertebral arteries of the neck.  The right common carotid artery is within normal limits. There is a focal calcification at the right carotid bifurcation without significant stenosis of the internal carotid artery relative to the distal vessel. The cervical ICA is otherwise normal.  The left common carotid artery is within normal limits. The bifurcation is unremarkable. The cervical left ICA is mildly tortuous without focal stenosis.  The soft tissues of the neck are unremarkable. The salivary glands are intact. The lung apices demonstrate pleural blebs and mild atelectasis.  Review of the MIP images confirms the above findings.  IMPRESSION: 1. No evidence for intracranial aneurysm. 2. Scattered white matter hypoattenuation is again noted without significant change. The finding is nonspecific but can be seen in the setting of chronic microvascular ischemia, a demyelinating process such as multiple sclerosis, vasculitis, complicated migraine headaches, or as the sequelae of a prior infectious or inflammatory process. 3. Minimal atherosclerotic changes are present at the left carotid bifurcation and bilateral cavernous carotid arteries without significant stenosis.   Electronically Signed   By: Gennette Pac M.D.   On: 09/02/2013 12:49   Dg Chest 2 View  08/31/2013   CLINICAL DATA:  Chest pain, hyperglycemia  EXAM: CHEST  2 VIEW  COMPARISON:  08/16/2013  FINDINGS: Cardiomediastinal silhouette is stable. No acute infiltrate or pulmonary edema. Stable left base scarring. Bony thorax is unremarkable.  IMPRESSION: No active cardiopulmonary disease.   Electronically Signed   By: Natasha Mead M.D.   On: 08/31/2013 12:22   Ct Head Wo Contrast  08/17/2013   CLINICAL DATA:  History of trauma from fall with injury to the head.   EXAM: CT HEAD WITHOUT CONTRAST  TECHNIQUE: Contiguous axial images were obtained from the base of the skull through the vertex without intravenous contrast.  COMPARISON:  Head CT 07/20/2013.  FINDINGS: No acute displaced skull fractures are identified. Tiny area of low attenuation  in the anterior limb of the right internal capsule, similar to the prior study, compatible with an old lacunar infarction. No acute intracranial abnormality. Specifically, no evidence of acute post-traumatic intracranial hemorrhage, no definite regions of acute/subacute cerebral ischemia, no focal mass, mass effect, hydrocephalus or abnormal intra or extra-axial fluid collections. The visualized paranasal sinuses and mastoids are well pneumatized.  IMPRESSION: 1. No acute displaced skull fractures or acute intracranial abnormalities. 2. 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   Electronically Signed   By: Vinnie Langton M.D.   On: 08/17/2013 00:23   Ct Angio Neck W/cm &/or Wo/cm  09/02/2013   CLINICAL DATA:  Right leg weakness. Blurred vision in the left eye. Dizziness. Question aneurysm.  EXAM: CT ANGIOGRAPHY HEAD AND NECK  TECHNIQUE: Multidetector CT imaging of the head and neck was performed using the standard protocol during bolus administration of intravenous contrast. Multiplanar CT image reconstructions and MIPs were obtained to evaluate the vascular anatomy. Carotid stenosis measurements (when applicable) are obtained utilizing NASCET criteria, using the distal internal carotid diameter as the denominator.  CONTRAST:  148mL OMNIPAQUE IOHEXOL 350 MG/ML SOLN  COMPARISON:  CT head without contrast 08/17/2013  FINDINGS: CTA HEAD FINDINGS  Mild white matter changes are stable. No acute cortical infarct, hemorrhage, or mass lesion is present. The ventricles are of normal size. No significant extra-axial fluid collection is present.  The paranasal sinuses and mastoid air cells are  clear. A remote left orbital blowout fracture is again seen.  The postcontrast images demonstrate no pathologic enhancement.  Intracranial internal carotid arteries demonstrate mild atherosclerotic irregularity within the cavernous segments bilaterally without significant stenosis. The ICA terminus is within normal limits bilaterally. The left A1 segment is dominant to the right. The anterior communicating artery is patent. The M1 segments and MCA bifurcations are intact bilaterally. The MCA branch vessels are within normal limits bilaterally.  The left vertebral artery is the dominant vessel. The right vertebral artery essentially terminates at the PICA ascending a very small branch to the vertebrobasilar junction. The basilar artery is small with fetal type posterior cerebral arteries bilaterally. A small left P1 segment is present.  The PCA branch vessels are within normal limits.  Review of the MIP images confirms the above findings.  CTA NECK FINDINGS  A standard 3 vessel arch configuration is present. The vertebral arteries both originate from the subclavian arteries. The left vertebral artery is dominant. The origin of the left vertebral artery is obscured by an adjacent vein. There is no significant stenosis within the vertebral arteries of the neck.  The right common carotid artery is within normal limits. There is a focal calcification at the right carotid bifurcation without significant stenosis of the internal carotid artery relative to the distal vessel. The cervical ICA is otherwise normal.  The left common carotid artery is within normal limits. The bifurcation is unremarkable. The cervical left ICA is mildly tortuous without focal stenosis.  The soft tissues of the neck are unremarkable. The salivary glands are intact. The lung apices demonstrate pleural blebs and mild atelectasis.  Review of the MIP images confirms the above findings.  IMPRESSION: 1. No evidence for intracranial aneurysm. 2.  Scattered white matter hypoattenuation is again noted without significant change. The finding is nonspecific but can be seen in the setting of chronic microvascular ischemia, a demyelinating process such as multiple sclerosis, vasculitis, complicated migraine headaches, or as the sequelae of a prior infectious or  inflammatory process. 3. Minimal atherosclerotic changes are present at the left carotid bifurcation and bilateral cavernous carotid arteries without significant stenosis.   Electronically Signed   By: Lawrence Santiago M.D.   On: 09/02/2013 12:49   Mr Brain Wo Contrast  09/03/2013   CLINICAL DATA:  Right-sided weakness. Stroke risk factors include diabetes, hypertension, hyperlipidemia, and smoking.  EXAM: MRI HEAD WITHOUT CONTRAST  TECHNIQUE: Multiplanar, multiecho pulse sequences of the brain and surrounding structures were obtained without intravenous contrast.  COMPARISON:  CTA head 09/02/2013.  MR brain 02/02/2012.  FINDINGS: No evidence for acute infarction, hemorrhage, mass lesion, hydrocephalus, or extra-axial fluid. Slight premature for age cerebral and cerebellar atrophy. Mild subcortical and periventricular T2 and FLAIR hyperintensities, likely chronic microvascular ischemic change. Remote right basal ganglia lacunar infarct. Flow voids are maintained throughout the carotid, basilar, and vertebral arteries. There are no areas of chronic hemorrhage. Partial empty sella. No tonsillar herniation. Visualized calvarium, skull base, and upper cervical osseous structures unremarkable. Scalp and extracranial soft tissues, orbits, sinuses, and mastoids show no acute process.  Compared with priors, good general agreement.  IMPRESSION: Mild atrophy and small vessel disease. Partial empty sella. No acute intracranial findings.   Electronically Signed   By: Rolla Flatten M.D.   On: 09/03/2013 10:04   Dg Chest Portable 1 View  09/02/2013   CLINICAL DATA:  Weakness and dizziness ; hypertension  EXAM: PORTABLE  CHEST - 1 VIEW  COMPARISON:  August 31, 2013  FINDINGS: Lungs are clear. Heart size and pulmonary vascularity are normal. No adenopathy. No bone lesions.  IMPRESSION: No edema or consolidation.   Electronically Signed   By: Lowella Grip M.D.   On: 09/02/2013 12:00   Dg Chest Port 1 View  08/16/2013   CLINICAL DATA:  Chest pain.  EXAM: PORTABLE CHEST - 1 VIEW  COMPARISON:  Chest x-ray 07/20/2013.  FINDINGS: Lung volumes are normal. No consolidative airspace disease. No pleural effusions. No pneumothorax. No pulmonary nodule or mass noted. Pulmonary vasculature and the cardiomediastinal silhouette are within normal limits. Atherosclerosis in the thoracic aorta.  IMPRESSION: 1.  No radiographic evidence of acute cardiopulmonary disease. 2. Atherosclerosis.   Electronically Signed   By: Vinnie Langton M.D.   On: 08/16/2013 23:46    2D Echo: 09/02/2013 Study Conclusions  - Left ventricle: The cavity size was normal. There was mild focal basal hypertrophy of the septum. Systolic function was normal. The estimated ejection fraction was in the range of 55% to 60%. Wall motion was normal; there were no regional wall motion abnormalities. There was an increased relative contribution of atrial contraction to ventricular filling. Doppler parameters are consistent with abnormal left ventricular relaxation (grade 1 diastolic dysfunction). - Pulmonary arteries: PA peak pressure: 34mm Hg (S).  Carotid dopplers 09/03/2013:  Preliminary report: Bilateral: 1-39% ICA stenosis lower end of range. Vertebral artery flow is antegrade.  Admission HPI: This is a 58yo AAF with PMH hypothyroidism, T2DM, HTN, bipolar d/o and schizophrenia, cocaine abuse, HLD, depression/anxiety, HLD, COPD, dysphagia w/ known schatzki's ring s/p dilation in 07/2012 who presents with c/o generalized weakness that progressed to R sided weakness (both arm and leg). Patient notes that this morning when she woke up she felt fatigued and as  though her whole body was weak. At around 10am she began having R leg tingling/weakness. She went to the bank and in the bank became lightheaded, no LOC and no falls. She did experience some sharp epigastric/lower chest pain that radiates into her back  during this episode. There was some mild SOB and nausea, no vomiting or diaphoresis. Patient notes having intermittent episodes of this same chest pain that she thinks is related to her esophageal ring. The pain is worsened by deep breathing. She denies having history of DOE, but does endorse having this same chest pain with both exertion and rest. The pain is not always associated with nausea, SOB. She does have food that gets stuck in her esophagus, especially solids. This is not much improved since her esophageal dilation by GI in March (Dr. Carlean Purl). Patient endorses having a cough productive of white sputum x 2 weeks, no other URI type symptoms. She does some 3 cigarettes per day with long smoking hx. Of note, during my exam patient having some chest pain (same quality as prior) and feeling as though her R arm and leg weaker than L side. Denies dysuria, abd pain. No recent travel or surgeries, no hx cancer, not on any HRT.  In the ED, VSS. She was hypokalemic to 3.3. CBC and coags wnl. Trop negative x 1. Head and neck CTA no acute process. UDS + cocaine (pt admits to using cocaine 2 nights ago). ETOH <11. UA negative. Patient's CBG 154 but dropped to 42 around noon. She was given an amp of D50 with repeat CBG 119. Patient reports complaince with home lantus 26U qHS and Novolog 3u with meals, did not take any more insulin than prescribed today and has eaten normally today. Once patient arrived on floor, CBG 65 so patient given carbohydrate load orally.   Hospital Course by problem list:  # Generalized weakness vs TIA: Patient presented with generalized weakness that progressed to R arm and leg weakness as well as presyncopal episode prior to admission. VSS.  Neurological exam relatively nonfocal, I think most of patient's findings on exam are 2/2 lack of effort on strength exam. CTA head and neck negative for any acute abnormality. Patient was admitted for stroke rule out. Neurology was consulted and pt was started on ASA 325mg  daily (had been on ASA 81mg  daily, but noncompliant). Overnight, troponin negative x 3, so ACS ruled out. MRI did not reveal any acute intracranial abnormality, so CVA essentially ruled out. Patient not orthostatic, so hypotension not likely the cause of her nearsyncope. At this point, I am favoring hypoglycemia to explain her symptoms given she had multiple low CBGs on day of admission. Patient's symptoms improved overnight, though did not completely resolve. CBGs were stable overnight and if anything were elevated. Direct LDL 186, patient's home statin was continued. A1c 15.5%. 2D echo showed EF 55-60% with grade 1 diastolic dysfunction, no intramural thrombus. Carotid dopplers wnl. Per neuro, this is very unlikely an ischemic event. Patient's antihypertensives initially held for permissive HTN, restarted on HD 2. PT evaluated patient and recommended HH PT so this was set up for patient prior to discharge. Patient was stable and in agreement with discharge. Neurology agreed with discharging patient as well. Unable to obtain neurology f/u for patient as the office had already closed, so patient was asked to call their office. She was arranged to see me in clinic next week. Of note, on HD 2, patient requested a note to be given to the court since she missed a court date- this was provided to patient.   # Atypical Chest pain/ epigastric pain: Likely GI in origin, though with many risk factors for ACS. This issue has been ongoing for patient for quite sometime now and remains unchanged recently. ACS  ruled out overnight. EKG unchanged from prior. May benefit from outpatient stress test, however I think this is more likely to be related to her  known schatki's ring given she also has significant dysphagia. Oxycodone 10mg  q4h (home regimen), continued home protonix 40mg  daily, continued home lipitor 80mg  daily and gemfibrozil 600mg  BID.  # Hypokalemia: Resolved. Patient with K 3.3 on admission. S/p kdur 86mEq x 1 dose, repeat K on morning of discharge was 4.3 and Mg level wnl.   # Mental health disorders: Pt with reported hx of bipolar disorder, anxiety/depression, schizophrenia. Mood appears stable. She takes depakote 1000mg  qHS, xanax .5mg  qHS prn, zoloft 100mg  daily- all were continued during her hospital stay.   # Hypothyroidism: TSH 77 this admission. Patient's last TSH 78.029 3/23, which is down from admission in February when it was 114 and her synthroid was restarted at 39mcg daily (pt had been noncompliant prior to this and had been written for high doses, so they started her back on slowly). Patient reports compliance with synthroid now. Per chart review, working toward up titration of her synthroid as outpatient. Therefore, her synthroid was increased  to 27mcg daily. TSH will need to be followed as outpatient.  #HTN: Pt normotensive. Initially held antihypertensives given possible stroke. Restarted home antihypertensives (lisinopril 40mg  daily and amlodipine 5mg  daily) since patient did not have CVA.   # DM type 2: Last A1c 15.3% with repeat this admission of 15.5%. She reports compliance with home lantus 26U qHS and Novolog 3U with meals- though I doubt this is the case given her poor control as highlighted by A1c. Patient had been hypoglycemic at admission, so lantus was held overnight. CBGs 94-298 overnight. Managed on SSI and required 13U. Restarted lantus 10U qHS and her Novolog 3U TID w/ meals at time of discharge. I asked patient to check her blood sugars at home and bring the values with her to clinic so we know what her CBGs are running.   # COPD: Stable no exacerbation. Albuterol and ? both dulera and advair were on home  med list. This is duplicate therapy, so stopped advair as this had been stopped in the past. Continued her albuterol and dulera while inpatient and at discharge.    Discharge Vitals:   BP 121/60  Pulse 67  Temp(Src) 98.5 F (36.9 C) (Oral)  Resp 18  Ht 5\' 2"  (1.575 m)  Wt 181 lb (82.101 kg)  BMI 33.10 kg/m2  SpO2 93%  Discharge Labs:  Results for orders placed during the hospital encounter of 09/02/13 (from the past 24 hour(s))  TROPONIN I     Status: None   Collection Time    09/02/13  7:10 PM      Result Value Ref Range   Troponin I <0.30  <0.30 ng/mL  LIPASE, BLOOD     Status: Abnormal   Collection Time    09/02/13  7:10 PM      Result Value Ref Range   Lipase 60 (*) 11 - 59 U/L  GLUCOSE, CAPILLARY     Status: Abnormal   Collection Time    09/02/13  9:35 PM      Result Value Ref Range   Glucose-Capillary 298 (*) 70 - 99 mg/dL   Comment 1 Notify RN     Comment 2 Documented in Chart    TROPONIN I     Status: None   Collection Time    09/03/13  1:50 AM      Result Value Ref  Range   Troponin I <0.30  <0.30 ng/mL  HEMOGLOBIN A1C     Status: Abnormal   Collection Time    09/03/13  1:50 AM      Result Value Ref Range   Hemoglobin A1C 15.5 (*) <5.7 %   Mean Plasma Glucose 398 (*) <117 mg/dL  LIPID PANEL     Status: Abnormal   Collection Time    09/03/13  1:50 AM      Result Value Ref Range   Cholesterol 268 (*) 0 - 200 mg/dL   Triglycerides 423 (*) <150 mg/dL   HDL 36 (*) >39 mg/dL   Total CHOL/HDL Ratio 7.4     VLDL UNABLE TO CALCULATE IF TRIGLYCERIDE OVER 400 mg/dL  0 - 40 mg/dL   LDL Cholesterol UNABLE TO CALCULATE IF TRIGLYCERIDE OVER 400 mg/dL  0 - 99 mg/dL  BASIC METABOLIC PANEL     Status: Abnormal   Collection Time    09/03/13  1:50 AM      Result Value Ref Range   Sodium 143  137 - 147 mEq/L   Potassium 4.3  3.7 - 5.3 mEq/L   Chloride 106  96 - 112 mEq/L   CO2 18 (*) 19 - 32 mEq/L   Glucose, Bld 329 (*) 70 - 99 mg/dL   BUN 13  6 - 23 mg/dL    Creatinine, Ser 1.08  0.50 - 1.10 mg/dL   Calcium 9.1  8.4 - 10.5 mg/dL   GFR calc non Af Amer 57 (*) >90 mL/min   GFR calc Af Amer 66 (*) >90 mL/min  MAGNESIUM     Status: None   Collection Time    09/03/13  1:50 AM      Result Value Ref Range   Magnesium 1.9  1.5 - 2.5 mg/dL  TSH     Status: Abnormal   Collection Time    09/03/13  1:50 AM      Result Value Ref Range   TSH 77.320 (*) 0.350 - 4.500 uIU/mL  GLUCOSE, CAPILLARY     Status: Abnormal   Collection Time    09/03/13  6:34 AM      Result Value Ref Range   Glucose-Capillary 203 (*) 70 - 99 mg/dL   Comment 1 Notify RN     Comment 2 Documented in Chart    GLUCOSE, CAPILLARY     Status: Abnormal   Collection Time    09/03/13 11:41 AM      Result Value Ref Range   Glucose-Capillary 294 (*) 70 - 99 mg/dL  LDL CHOLESTEROL, DIRECT     Status: Abnormal   Collection Time    09/03/13  1:20 PM      Result Value Ref Range   Direct LDL 183 (*)   BASIC METABOLIC PANEL     Status: Abnormal   Collection Time    09/03/13  1:20 PM      Result Value Ref Range   Sodium 139  137 - 147 mEq/L   Potassium 4.3  3.7 - 5.3 mEq/L   Chloride 102  96 - 112 mEq/L   CO2 20  19 - 32 mEq/L   Glucose, Bld 305 (*) 70 - 99 mg/dL   BUN 10  6 - 23 mg/dL   Creatinine, Ser 1.11 (*) 0.50 - 1.10 mg/dL   Calcium 9.1  8.4 - 10.5 mg/dL   GFR calc non Af Amer 55 (*) >90 mL/min   GFR calc Af Amer 64 (*) >  90 mL/min  GLUCOSE, CAPILLARY     Status: Abnormal   Collection Time    09/03/13  5:00 PM      Result Value Ref Range   Glucose-Capillary 297 (*) 70 - 99 mg/dL    Signed: Rebecca Eaton, MD 09/03/2013, 6:45 PM   Time Spent on Discharge: 35 minutes Services Ordered on Discharge: [facility] PT Equipment Ordered on Discharge: none

## 2013-09-03 NOTE — Evaluation (Signed)
Physical Therapy Evaluation Patient Details Name: [name] MRN: [id number] DOB: [birth year] Today's Date: 09/03/2013   History of Present Illness  pt presents with R sided weakness and altered sensation.    Clinical Impression  Pt generally unsteady with mobility and seems a little ataxic during ambulation.  Pt has good support from her Fiance and an Aide at home.  Will continue to follow.      Follow Up Recommendations Home health PT;Supervision/Assistance - 24 hour    Equipment Recommendations  None recommended by PT    Recommendations for Other Services       Precautions / Restrictions Precautions Precautions: Fall Restrictions Weight Bearing Restrictions: No      Mobility  Bed Mobility Overal bed mobility: Needs Assistance Bed Mobility: Supine to Sit;Sit to Supine     Supine to sit: Supervision;HOB elevated Sit to supine: Supervision;HOB elevated   General bed mobility comments: pt needs HOB elevated and utilizes bed rail to bring trunk up to sitting.    Transfers Overall transfer level: Needs assistance Equipment used: Rolling walker (2 wheeled) Transfers: Sit to/from Stand Sit to Stand: Min guard         General transfer comment: pt mildly unsteady and needs cueing for use of UEs, as she tends to grab RW to pull herself up to standing.  pt indicates feeling dizzy sitting EOB and in standing.  Describes feeling as spinning.    Ambulation/Gait Ambulation/Gait assistance: Min guard Ambulation Distance (Feet): 40 Feet Assistive device: Rolling walker (2 wheeled) Gait Pattern/deviations: Step-through pattern;Decreased stride length;Ataxic   Gait velocity interpretation: Below normal speed for age/gender General Gait Details: pt moves slowly and shuffles.  pt mildly ataxic and uncoordinated during amb.    Stairs            Wheelchair Mobility    Modified Rankin (Stroke Patients Only) Modified Rankin (Stroke Patients Only) Pre-Morbid Rankin Score:  Moderate disability Modified Rankin: Moderately severe disability     Balance Overall balance assessment: Needs assistance         Standing balance support: During functional activity Standing balance-Leahy Scale: Poor                               Pertinent Vitals/Pain Indicates her back is sore.      Home Living Family/patient expects to be discharged to:: Private residence Living Arrangements: Spouse/significant other Available Help at Discharge: Family;Available 24 hours/day;Personal care attendant Type of Home: House Home Access: Stairs to enter Entrance Stairs-Rails: Left Entrance Stairs-Number of Steps: 7 Home Layout: One level Home Equipment: Cane - single point;Walker - 2 wheels Additional Comments: pt has Aide 7 days per week for ~2 hours.      Prior Function Level of Independence: Needs assistance   Gait / Transfers Assistance Needed: Uses RW predominantly.    ADL's / Homemaking Assistance Needed: Aide does meal prep, cleaning, laundry, and A with pt's bathing and dressing.          Hand Dominance   Dominant Hand: Right    Extremity/Trunk Assessment   Upper Extremity Assessment: Defer to OT evaluation           Lower Extremity Assessment: Generalized weakness;RLE deficits/detail;LLE deficits/detail RLE Deficits / Details: PF 2/5, knee/hip 3+/5.  pt mildly ataxic movements.   LLE Deficits / Details: Strength grossly 4/5 with mildly ataxic movements.       Communication   Communication: No difficulties  Cognition  Arousal/Alertness: Awake/alert Behavior During Therapy: WFL for tasks assessed/performed Overall Cognitive Status: Impaired/Different from baseline Area of Impairment: Attention;Memory;Awareness;Problem solving   Current Attention Level: Selective Memory: Decreased short-term memory     Awareness: Emergent Problem Solving: Slow processing;Decreased initiation;Difficulty sequencing;Requires verbal cues;Requires tactile  cues      General Comments      Exercises        Assessment/Plan    PT Assessment Patient needs continued PT services  PT Diagnosis Difficulty walking;Generalized weakness   PT Problem List Decreased strength;Decreased activity tolerance;Decreased balance;Decreased mobility;Decreased coordination;Decreased cognition;Decreased knowledge of use of DME;Impaired sensation  PT Treatment Interventions DME instruction;Gait training;Stair training;Functional mobility training;Therapeutic activities;Therapeutic exercise;Balance training;Neuromuscular re-education;Patient/family education;Cognitive remediation   PT Goals (Current goals can be found in the Care Plan section) Acute Rehab PT Goals Patient Stated Goal: None stated PT Goal Formulation: With patient Time For Goal Achievement: 09/17/13 Potential to Achieve Goals: Good    Frequency Min 4X/week   Barriers to discharge        Co-evaluation               End of Session Equipment Utilized During Treatment: Gait belt Activity Tolerance: Patient tolerated treatment well Patient left: in bed;with call bell/phone within reach;with bed alarm set;with family/visitor present Nurse Communication: Mobility status    Functional Assessment Tool Used: Clinical Judgement Functional Limitation: Mobility: Walking and moving around Mobility: Walking and Moving Around Current Status [phone]): At least 1 percent but less than 20 percent impaired, limited or restricted Mobility: Walking and Moving Around Goal Status [phone]): 0 percent impaired, limited or restricted    Time: 0759-0826 PT Time Calculation (min): 27 min   Charges:   PT Evaluation $Initial PT Evaluation Tier I: 1 Procedure PT Treatments $Gait Training: 8-22 mins   PT G Codes:   Functional Assessment Tool Used: Clinical Judgement Functional Limitation: Mobility: Walking and moving around    [employer], [place]  [phone] 09/03/2013, 8:38 AM

## 2013-09-03 NOTE — Progress Notes (Signed)
Talked to patient about home health care choices, pt requested [facility]; referral made; Aneta Mins [phone]

## 2013-09-03 NOTE — H&P (Signed)
Internal Medicine Attending Admission Note Date: 09/03/2013  Patient name: [name] Medical record number: [id number] Date of birth: [birth year] Age: 58 y.o. Gender: female  I saw and evaluated the patient. I reviewed the resident's note and I agree with the resident's findings and plan as documented in the resident's note, with the following additional comments.  Chief Complaint(s): Right sided weakness  History - key components related to admission: Patient is a 58 year old woman with history of hypothyroidism, diabetes mellitus, hypertension, and other problems as outlined in the medical history, admitted with complaint of right-sided weakness and generalized fatigue that started on the day of admission.     Physical Exam - key components related to admission:  Filed Vitals:   09/03/13 0657 09/03/13 0658 09/03/13 0700 09/03/13 1038  BP: 139/60 135/63 136/74 157/77  Pulse: 52 55 88 63  Temp:    98.3 F (36.8 C)  TempSrc:    Oral  Resp:      Height:      Weight:      SpO2: 97% 100% 100% 98%   General: Alert, no distress Lungs: Clear Heart: Regular; no extra sounds or murmurs Abdomen: Bowel sounds present, soft, nontender Extremities: Alert and oriented; cranial nerves intact; possible mild right upper extremity and right lower extremity weakness  Lab results:   Basic Metabolic Panel:  Recent Labs  09/02/13 1150 09/02/13 1158 09/03/13 0150  NA 141 143 143  K 3.3* 3.1* 4.3  CL 104 106 106  CO2 24  --  18*  GLUCOSE 62* 64* 329*  BUN 13 12 13   CREATININE 1.06 1.20* 1.08  CALCIUM 9.4  --  9.1  MG  --   --  1.9    Liver Function Tests:  Recent Labs  09/02/13 1150  AST 22  ALT 14  ALKPHOS 138*  BILITOT 0.3  PROT 6.9  ALBUMIN 3.4*    Recent Labs  09/02/13 1910  LIPASE 60*     CBC:  Recent Labs  09/02/13 1150 09/02/13 1158  WBC 7.6  --   HGB 13.5 15.3*  HCT 40.0 45.0  MCV 85.3  --   PLT 296  --     Recent Labs  09/02/13 1150   NEUTROABS 4.8  LYMPHSABS 2.3  MONOABS 0.3  EOSABS 0.1  BASOSABS 0.0    Cardiac Enzymes:  Recent Labs  09/02/13 1551 09/02/13 1910 09/03/13 0150  TROPONINI <0.30 <0.30 <0.30     CBG:  Recent Labs  09/02/13 1254 09/02/13 1401 09/02/13 1631 09/02/13 1708 09/02/13 2135 09/03/13 0634  GLUCAP 163* 119* 65* 94 298* 203*     Fasting Lipid Panel:  Recent Labs  09/03/13 0150  CHOL 268*  HDL 36*  LDLCALC UNABLE TO CALCULATE IF TRIGLYCERIDE OVER 400 mg/dL  TRIG 423*  CHOLHDL 7.4    Thyroid Function Tests:  Recent Labs  09/03/13 0150  TSH 77.320*     Coagulation:  Recent Labs  09/02/13 1150  INR 0.98    Urine Drug Screen: Drugs of Abuse     Component Value Date/Time   LABOPIA NONE DETECTED 09/02/2013 1239   COCAINSCRNUR POSITIVE* 09/02/2013 1239   LABBENZ NONE DETECTED 09/02/2013 1239   AMPHETMU NONE DETECTED 09/02/2013 1239   THCU NONE DETECTED 09/02/2013 1239   LABBARB NONE DETECTED 09/02/2013 1239     Alcohol Level:  Recent Labs  09/02/13 1150  ETH <11    Urinalysis    Component Value Date/Time   COLORURINE YELLOW 09/02/2013 1239  APPEARANCEUR CLOUDY* 09/02/2013 1239   LABSPEC 1.017 09/02/2013 1239   PHURINE 5.5 09/02/2013 1239   GLUCOSEU NEGATIVE 09/02/2013 1239   HGBUR NEGATIVE 09/02/2013 1239   BILIRUBINUR NEGATIVE 09/02/2013 1239   KETONESUR NEGATIVE 09/02/2013 1239   PROTEINUR NEGATIVE 09/02/2013 1239   UROBILINOGEN 0.2 09/02/2013 1239   NITRITE NEGATIVE 09/02/2013 1239   LEUKOCYTESUR NEGATIVE 09/02/2013 1239    Urine microscopic:  Recent Labs  08/31/13 1309  EPIU FEW*  WBCU 3-6  RBCU 0-2  BACTERIA FEW*     Imaging results:  Ct Angio Head W/cm &/or Wo Cm  09/02/2013   CLINICAL DATA:  Right leg weakness. Blurred vision in the left eye. Dizziness. Question aneurysm.  EXAM: CT ANGIOGRAPHY HEAD AND NECK  TECHNIQUE: Multidetector CT imaging of the head and neck was performed using the standard protocol during bolus administration of intravenous  contrast. Multiplanar CT image reconstructions and MIPs were obtained to evaluate the vascular anatomy. Carotid stenosis measurements (when applicable) are obtained utilizing NASCET criteria, using the distal internal carotid diameter as the denominator.  CONTRAST:  152mL OMNIPAQUE IOHEXOL 350 MG/ML SOLN  COMPARISON:  CT head without contrast 08/17/2013  FINDINGS: CTA HEAD FINDINGS  Mild white matter changes are stable. No acute cortical infarct, hemorrhage, or mass lesion is present. The ventricles are of normal size. No significant extra-axial fluid collection is present.  The paranasal sinuses and mastoid air cells are clear. A remote left orbital blowout fracture is again seen.  The postcontrast images demonstrate no pathologic enhancement.  Intracranial internal carotid arteries demonstrate mild atherosclerotic irregularity within the cavernous segments bilaterally without significant stenosis. The ICA terminus is within normal limits bilaterally. The left A1 segment is dominant to the right. The anterior communicating artery is patent. The M1 segments and MCA bifurcations are intact bilaterally. The MCA branch vessels are within normal limits bilaterally.  The left vertebral artery is the dominant vessel. The right vertebral artery essentially terminates at the PICA ascending a very small branch to the vertebrobasilar junction. The basilar artery is small with fetal type posterior cerebral arteries bilaterally. A small left P1 segment is present.  The PCA branch vessels are within normal limits.  Review of the MIP images confirms the above findings.  CTA NECK FINDINGS  A standard 3 vessel arch configuration is present. The vertebral arteries both originate from the subclavian arteries. The left vertebral artery is dominant. The origin of the left vertebral artery is obscured by an adjacent vein. There is no significant stenosis within the vertebral arteries of the neck.  The right common carotid artery is  within normal limits. There is a focal calcification at the right carotid bifurcation without significant stenosis of the internal carotid artery relative to the distal vessel. The cervical ICA is otherwise normal.  The left common carotid artery is within normal limits. The bifurcation is unremarkable. The cervical left ICA is mildly tortuous without focal stenosis.  The soft tissues of the neck are unremarkable. The salivary glands are intact. The lung apices demonstrate pleural blebs and mild atelectasis.  Review of the MIP images confirms the above findings.  IMPRESSION: 1. No evidence for intracranial aneurysm. 2. Scattered white matter hypoattenuation is again noted without significant change. The finding is nonspecific but can be seen in the setting of chronic microvascular ischemia, a demyelinating process such as multiple sclerosis, vasculitis, complicated migraine headaches, or as the sequelae of a prior infectious or inflammatory process. 3. Minimal atherosclerotic changes are present at the left carotid  bifurcation and bilateral cavernous carotid arteries without significant stenosis.   Electronically Signed   By: Lawrence Santiago M.D.   On: 09/02/2013 12:49   Ct Angio Neck W/cm &/or Wo/cm  09/02/2013   CLINICAL DATA:  Right leg weakness. Blurred vision in the left eye. Dizziness. Question aneurysm.  EXAM: CT ANGIOGRAPHY HEAD AND NECK  TECHNIQUE: Multidetector CT imaging of the head and neck was performed using the standard protocol during bolus administration of intravenous contrast. Multiplanar CT image reconstructions and MIPs were obtained to evaluate the vascular anatomy. Carotid stenosis measurements (when applicable) are obtained utilizing NASCET criteria, using the distal internal carotid diameter as the denominator.  CONTRAST:  131mL OMNIPAQUE IOHEXOL 350 MG/ML SOLN  COMPARISON:  CT head without contrast 08/17/2013  FINDINGS: CTA HEAD FINDINGS  Mild white matter changes are stable. No acute  cortical infarct, hemorrhage, or mass lesion is present. The ventricles are of normal size. No significant extra-axial fluid collection is present.  The paranasal sinuses and mastoid air cells are clear. A remote left orbital blowout fracture is again seen.  The postcontrast images demonstrate no pathologic enhancement.  Intracranial internal carotid arteries demonstrate mild atherosclerotic irregularity within the cavernous segments bilaterally without significant stenosis. The ICA terminus is within normal limits bilaterally. The left A1 segment is dominant to the right. The anterior communicating artery is patent. The M1 segments and MCA bifurcations are intact bilaterally. The MCA branch vessels are within normal limits bilaterally.  The left vertebral artery is the dominant vessel. The right vertebral artery essentially terminates at the PICA ascending a very small branch to the vertebrobasilar junction. The basilar artery is small with fetal type posterior cerebral arteries bilaterally. A small left P1 segment is present.  The PCA branch vessels are within normal limits.  Review of the MIP images confirms the above findings.  CTA NECK FINDINGS  A standard 3 vessel arch configuration is present. The vertebral arteries both originate from the subclavian arteries. The left vertebral artery is dominant. The origin of the left vertebral artery is obscured by an adjacent vein. There is no significant stenosis within the vertebral arteries of the neck.  The right common carotid artery is within normal limits. There is a focal calcification at the right carotid bifurcation without significant stenosis of the internal carotid artery relative to the distal vessel. The cervical ICA is otherwise normal.  The left common carotid artery is within normal limits. The bifurcation is unremarkable. The cervical left ICA is mildly tortuous without focal stenosis.  The soft tissues of the neck are unremarkable. The salivary glands  are intact. The lung apices demonstrate pleural blebs and mild atelectasis.  Review of the MIP images confirms the above findings.  IMPRESSION: 1. No evidence for intracranial aneurysm. 2. Scattered white matter hypoattenuation is again noted without significant change. The finding is nonspecific but can be seen in the setting of chronic microvascular ischemia, a demyelinating process such as multiple sclerosis, vasculitis, complicated migraine headaches, or as the sequelae of a prior infectious or inflammatory process. 3. Minimal atherosclerotic changes are present at the left carotid bifurcation and bilateral cavernous carotid arteries without significant stenosis.   Electronically Signed   By: Lawrence Santiago M.D.   On: 09/02/2013 12:49   Mr Brain Wo Contrast  09/03/2013   CLINICAL DATA:  Right-sided weakness. Stroke risk factors include diabetes, hypertension, hyperlipidemia, and smoking.  EXAM: MRI HEAD WITHOUT CONTRAST  TECHNIQUE: Multiplanar, multiecho pulse sequences of the brain and surrounding structures were  obtained without intravenous contrast.  COMPARISON:  CTA head 09/02/2013.  MR brain 02/02/2012.  FINDINGS: No evidence for acute infarction, hemorrhage, mass lesion, hydrocephalus, or extra-axial fluid. Slight premature for age cerebral and cerebellar atrophy. Mild subcortical and periventricular T2 and FLAIR hyperintensities, likely chronic microvascular ischemic change. Remote right basal ganglia lacunar infarct. Flow voids are maintained throughout the carotid, basilar, and vertebral arteries. There are no areas of chronic hemorrhage. Partial empty sella. No tonsillar herniation. Visualized calvarium, skull base, and upper cervical osseous structures unremarkable. Scalp and extracranial soft tissues, orbits, sinuses, and mastoids show no acute process.  Compared with priors, good general agreement.  IMPRESSION: Mild atrophy and small vessel disease. Partial empty sella. No acute intracranial  findings.   Electronically Signed   By: Rolla Flatten M.D.   On: 09/03/2013 10:04   Dg Chest Portable 1 View  09/02/2013   CLINICAL DATA:  Weakness and dizziness ; hypertension  EXAM: PORTABLE CHEST - 1 VIEW  COMPARISON:  August 31, 2013  FINDINGS: Lungs are clear. Heart size and pulmonary vascularity are normal. No adenopathy. No bone lesions.  IMPRESSION: No edema or consolidation.   Electronically Signed   By: Lowella Grip M.D.   On: 09/02/2013 12:00    Other results: EKG: Sinus bradycardia;nonspecific T wave abnormality  Assessment & Plan by Problem:  1.  Right-sided weakness.  The etiology is unclear; there is no evidence of stroke by head CT scan or MRI.  Patient reports improvement in her weakness since yesterday.  This may have been a TIA; other considerations include possible hypoglycemic episode or other problem.  Cocaine may have been a contributing factor.  Neurology consult is following patient; will await their recommendations.  Other plans include aspirin 325 mg daily; advise abstinence from cocaine; OT/PT; carotid Dopplers.  2.  Atypical chest pain/epigastric pain.  No evidence of acute coronary syndrome by enzymes or EKG; symptoms sound more upper GI related.  Patient has undergone dilation of a Schatzki's ring and had antral gastritis on EGD done by Dr. Carlean Purl on 08/12/2013.  Plan is continue PPI; as above, advise abstinence from cocaine; symptomatic treatment; advise GI followup; can consider elective cardiac workup if pain persists/recurs.  3.  Hypothyroidism.  Elevated TSH indicates under replacement, possibly due to nonadherence or low dose of levothyroxine.  Plan is encourage compliance; would increase levothyroxine to 50 mcg daily and recheck TSH in 4-6 weeks.  4.  Diabetes mellitus.  Patient had relatively low blood sugars on day of admission; plan is follow CBG and adjust insulin regimen as indicated.  5.  Other problems and plans as per the resident physician's note.

## 2013-09-03 NOTE — Progress Notes (Signed)
UR completed 

## 2013-09-07 ENCOUNTER — Encounter: Payer: Self-pay | Admitting: Internal Medicine

## 2013-09-07 ENCOUNTER — Ambulatory Visit (INDEPENDENT_AMBULATORY_CARE_PROVIDER_SITE_OTHER): Payer: PRIVATE HEALTH INSURANCE | Admitting: Internal Medicine

## 2013-09-07 VITALS — BP 140/80 | HR 67 | Temp 97.0°F | Ht 63.0 in | Wt 188.3 lb

## 2013-09-07 DIAGNOSIS — E1165 Type 2 diabetes mellitus with hyperglycemia: Principal | ICD-10-CM

## 2013-09-07 DIAGNOSIS — I10 Essential (primary) hypertension: Secondary | ICD-10-CM

## 2013-09-07 DIAGNOSIS — G8929 Other chronic pain: Secondary | ICD-10-CM

## 2013-09-07 DIAGNOSIS — F418 Other specified anxiety disorders: Secondary | ICD-10-CM

## 2013-09-07 DIAGNOSIS — IMO0002 Reserved for concepts with insufficient information to code with codable children: Secondary | ICD-10-CM

## 2013-09-07 DIAGNOSIS — E039 Hypothyroidism, unspecified: Secondary | ICD-10-CM

## 2013-09-07 DIAGNOSIS — H409 Unspecified glaucoma: Secondary | ICD-10-CM

## 2013-09-07 DIAGNOSIS — M549 Dorsalgia, unspecified: Secondary | ICD-10-CM

## 2013-09-07 DIAGNOSIS — E1149 Type 2 diabetes mellitus with other diabetic neurological complication: Secondary | ICD-10-CM

## 2013-09-07 LAB — GLUCOSE, CAPILLARY
Glucose-Capillary: 68 mg/dL — ABNORMAL LOW (ref 70–99)
Glucose-Capillary: 85 mg/dL (ref 70–99)

## 2013-09-07 MED ORDER — ATORVASTATIN CALCIUM 80 MG PO TABS: 80.0000 mg | ORAL_TABLET | ORAL | Status: DC

## 2013-09-07 MED ORDER — INSULIN ASPART 100 UNIT/ML ~~LOC~~ SOLN: 3.0000 [IU] | Freq: Three times a day (TID) | SUBCUTANEOUS | Status: DC

## 2013-09-07 MED ORDER — GABAPENTIN 100 MG PO CAPS: 300.0000 mg | ORAL_CAPSULE | Freq: Three times a day (TID) | ORAL | Status: DC

## 2013-09-07 MED ORDER — TRAMADOL HCL 50 MG PO TABS: 50.0000 mg | ORAL_TABLET | Freq: Four times a day (QID) | ORAL | Status: DC | PRN

## 2013-09-07 MED ORDER — ALPRAZOLAM 0.5 MG PO TABS: 0.5000 mg | ORAL_TABLET | Freq: Every evening | ORAL | Status: DC | PRN

## 2013-09-07 NOTE — Progress Notes (Signed)
Patient ID: [name], female   DOB: 12-06-1955, 58 y.o.   MRN: [id number] HPI The patient is a 58 y.o. female with a history of HTN, COPD, schatzki ring (EGD 07/2013), hypothyroidism, poorly controlled DM type 2, depression/anxiety, bipolar d/o, chronic back pain who presents for a hospital f/u visit.  Patient was hospitalized from 4/8-4/9 for generalized weakness. Please see the discharge summary that I wrote for all details, but generally speaking CVA was ruled out (CTA head/neck, MRI, carotid dopplers, 2D echo neg). Pt found to be hypoglycemic on admission, thought to be due to patient taking more insulin than she requires as her insulin titration as outpatient has been very difficult as she is noncompliance with uncontrolled CBG (A1c 15% 4/8). On admission she reported taking lantus 25U qHS (rx was for 26U qHS) and Novolog 3U TID with meals. Patient was discharged home on lantus 10U qHS with Novolog 3U TID with meals with the plan of up titrating her insulin as needed. She does not have a meter at home so does not check her CBGs. She would like to meet with Butch Penny. Of note, she did present to clinic today with generalized weakness both today and over the weekend, no tremor or sweating or N/V. CBG in clinic 68, she was given crackers and some glucose orally-->repeat CBG 85 (pt feeling better as well).  Pt also with h/o glaucoma and has had worsening blurry vision to L eye over the last 6 months, no pain to the eye and no double vision. She was last seen by ophtho in 2013.  Patient w/ hx of hypothyroidism and synthroid noncompliance. During a hospitalization in Feb 2015 patient's TSh found to be >100 and patient had not been taking her synthroid (dose at that time was 362mcg daily). Given unknown synthroid requirement, patient was started on synthroid 68mcg daily at that time. Repeat TSH during her admission last week her TSH was 77. She reports compliance with the synthroid. During her most recent  hospitalization last week we increased her synthroid to 1mcg daily. Will need her TSH rechecked in 4-6 weeks.   Patient's ASA was increased from 81mg  daily to 325mg  daily per neurology during her admission. She has not yet made this change. Would like this medication sent to pharmacy.  She does take Lipitor 80mg  daily for high cholesterol (last LDL during her hospitalization 183.  Pt notes HHPT did contact her and will be coming to her house soon.   ROS: General: no fevers, chills; +generalized weakness Skin: no rash HEENT: L eye blurry vision; no sore throat Pulm: no dyspnea, coughing CV: no chest pain, palpitations, shortness of breath Abd: no abdominal pain, nausea/vomiting, diarrhea/constipation GU: no dysuria Ext: no arthralgias, myalgias Neuro: no focal weakness, numbness, or tingling  Filed Vitals:   09/07/13 1442  BP: 140/80  Pulse: 67  Temp: 97 F (36.1 C)   Physical Exam General: alert, cooperative, and in no apparent distress HEENT: pupils equal round and reactive to light, vision grossly intact (able to discern number of fingers with each eye, one at a time), oropharynx clear and non-erythematous  Neck: supple Lungs: clear to ascultation bilaterally, normal work of respiration, no wheezes, rales, ronchi Heart: regular rate and rhythm, no murmurs, gallops, or rubs Abdomen: soft, non-tender, non-distended, normal bowel sounds Extremities: warm, no BLE edema Neurologic: alert & oriented X3, cranial nerves II-XII grossly intact, strength grossly intact, sensation intact to light touch; normal gait  Current Outpatient Prescriptions on File Prior to Visit  Medication Sig Dispense Refill  . albuterol (PROVENTIL HFA;VENTOLIN HFA) 108 (90 BASE) MCG/ACT inhaler Inhale 2 puffs into the lungs every 6 (six) hours as needed for wheezing.  1 Inhaler  2  . albuterol (PROVENTIL) (2.5 MG/3ML) 0.083% nebulizer solution Inhale 2.5 mg into the lungs every 6 (six) hours as needed for  wheezing.      Marland Kitchen allopurinol (ZYLOPRIM) 300 MG tablet Take 300 mg by mouth daily. For gout.      Marland Kitchen amLODipine (NORVASC) 5 MG tablet Take 5 mg by mouth daily. For hypertension.      Marland Kitchen aspirin EC 325 MG EC tablet Take 1 tablet (325 mg total) by mouth daily.  30 tablet  0  . brimonidine (ALPHAGAN) 0.2 % ophthalmic solution Place 1 drop into both eyes 2 (two) times daily.  5 mL  12  . divalproex (DEPAKOTE) 500 MG DR tablet Take 1,000 mg by mouth at bedtime.       Marland Kitchen gemfibrozil (LOPID) 600 MG tablet Take 600 mg by mouth 2 (two) times daily before a meal.      . insulin glargine (LANTUS) 100 UNIT/ML injection Inject 0.1 mLs (10 Units total) into the skin at bedtime.  10 mL  11  . latanoprost (XALATAN) 0.005 % ophthalmic solution Place 1 drop into both eyes at bedtime.  2.5 mL  0  . levothyroxine (SYNTHROID, LEVOTHROID) 50 MCG tablet Take 1 tablet (50 mcg total) by mouth daily before breakfast.  30 tablet  2  . lisinopril (PRINIVIL,ZESTRIL) 40 MG tablet Take 1 tablet (40 mg total) by mouth daily.  30 tablet  3  . mometasone-formoterol (DULERA) 100-5 MCG/ACT AERO Inhale 2 puffs into the lungs 2 (two) times daily.  1 Inhaler    . nitroGLYCERIN (NITROSTAT) 0.4 MG SL tablet Place 1 tablet (0.4 mg total) under the tongue every 5 (five) minutes as needed for chest pain.  30 tablet  3  . ondansetron (ZOFRAN ODT) 8 MG disintegrating tablet Take 1 tablet (8 mg total) by mouth every 8 (eight) hours as needed for nausea or vomiting.  12 tablet  0  . pantoprazole (PROTONIX) 40 MG tablet Take 1 tablet (40 mg total) by mouth daily. For reflux.  30 tablet  11  . sertraline (ZOLOFT) 100 MG tablet Take 1 tablet (100 mg total) by mouth daily.  30 tablet  0  . colchicine 0.6 MG tablet Take 1 tablet (0.6 mg total) by mouth daily as needed (for gout flare-ups).       No current facility-administered medications on file prior to visit.    Assessment/Plan

## 2013-09-07 NOTE — Assessment & Plan Note (Signed)
Patient with elevated TSH 77 during her hospitalization last week (down from >100 in February). Patient had been taking synthroid 81mcg daily and this was increased to synthroid 73mcg daily at her hospital discharge. Reports compliance with this new increased dose. Will need TSH rechecked in 4-6 weeks.

## 2013-09-07 NOTE — Assessment & Plan Note (Signed)
Patient has been told numerous times in the past that our clinic can no longer prescribe narcotics given her continued cocaine abuse (UDS + cocaine during hospital stay 08/2013). I discontinued her oxycodone today. This should NOT be prescribed for patient through our clinic again. There is a standing FYI in patient's chart. I gave her tramadol 50mg  BID prn, #20. Also referred her to pain clinic. She can call if she needs more tramadol before her pain clinic appointment.

## 2013-09-07 NOTE — Assessment & Plan Note (Signed)
Lab Results  Component Value Date   HGBA1C 15.5* 09/03/2013   HGBA1C 15.3* 07/20/2013   HGBA1C 13.6* 04/08/2013     Assessment: Diabetes control:  very poor Progress toward A1C goal: unchanged   Comments: h/o medication noncompliance and more recently taking more insulin than she needs 2/2 difficult insulin titrate because of her hx of noncompliance with recent adherence to her insulin regimen (hypoglycemic as per HPI)  Plan: Medications:  decreasing insulin per below Home glucose monitoring: Frequency:   Timing:   Instruction/counseling given: reminded to get eye exam, reminded to bring blood glucose meter & log to each visit, reminded to bring medications to each visit, discussed foot care and discussed the need for weight loss Educational resources provided:   Self management tools provided:   Other plans:  -pt to meet with Butch Penny tomorrow at 10:30 AM for discussion of taking CBGs at home as well as prescription of her glucometer (pt does not have meter at home and due to difficulty with patient understanding I did not prescribe this today; I am hopeful that Butch Penny can provide streamlined instruction and prescription of all supplies she will need for CBG checks at home) -emphasized multiple times to patient that she needs to be taking less insulin (also instructed her to do this at her hospital discharge last week); she is now supposed to be on only lantus 10U qHS with plan to up titrate as needed given pt with unknown lantus requirement 2/2 hx of noncompliance -continue novolog 3U TID with meals -patient counseled to eat carb snack if she feels generally weak, jittery, sweaty, nauseated especially today since she took lantus 25U last night and had low CBG in clinic (responded to carb snack)

## 2013-09-07 NOTE — Patient Instructions (Signed)
Thank you for your visit.  Medication changes: -make sure you are taking synthroid 27mcg daily -make sure you are taking aspirin 325mg  daily ( NOT 81mg ) -make sure you are taking lipitor 80mg  daily  I referred you to the eye doctor for you blurry vision. I referred you to the pain clinic for you back pain. I also prescribed a course of tramadol for this pain to last you in the mean time.  We can no longer prescribe you narcotic medications such as oxycodone.  IT IS IMPORTANT THAT YOU DO NOT TAKE MORE THAN LANTUS 10 UNITS EVERY NIGHT. YOU CAN CONTINUE TAKING NOVOLOG 3 UNITS WITH MEALS.  PLEASE FOLLOW UP WITH DONNA PLYLER TOMORROW AS SCHEDULED AT 10:30AM.  PLEASE CHECK YOUR SUGARS AT LEAST 3 TIMES DAILY AND BRING YOUR METER WITH YOU TO ALL OF YOUR VISITS.

## 2013-09-07 NOTE — Assessment & Plan Note (Signed)
Pt with worsening blurry vision to L eye over the last 6 months. Likely related to her known glaucoma. No pain, discharge to her eye. Vision is grossly intact (can discern number of fingers at 6 feet w/ both eyes separately). She is requesting ophtho referral.  -ophtho referral placed

## 2013-09-07 NOTE — Progress Notes (Signed)
Hypoglycemic Event  CBG: 68  Treatment: 1 tube of Glutose 15.  Symptoms: pt asymptomatic.  Follow-up CBG: Time:1533 CBG Result:85  Possible Reasons for Event: unknown; pt stated she ate  Comments/MD notified: yes    [name]  Remember to initiate Hypoglycemia Order Set & complete

## 2013-09-08 ENCOUNTER — Encounter: Payer: Self-pay | Admitting: Dietician

## 2013-09-08 NOTE — Progress Notes (Signed)
INTERNAL MEDICINE TEACHING ATTENDING ADDENDUM - Maria Reno, MD: I reviewed and discussed at the time of visit with the resident Dr. Chikowski, the patient's medical history, physical examination, diagnosis and results of tests and treatment and I agree with the patient's care as documented. 

## 2013-09-09 ENCOUNTER — Ambulatory Visit (INDEPENDENT_AMBULATORY_CARE_PROVIDER_SITE_OTHER): Payer: PRIVATE HEALTH INSURANCE | Admitting: Dietician

## 2013-09-09 ENCOUNTER — Other Ambulatory Visit: Payer: Self-pay | Admitting: Dietician

## 2013-09-09 ENCOUNTER — Encounter: Payer: Self-pay | Admitting: Dietician

## 2013-09-09 VITALS — Wt 188.3 lb

## 2013-09-09 DIAGNOSIS — E1149 Type 2 diabetes mellitus with other diabetic neurological complication: Secondary | ICD-10-CM

## 2013-09-09 DIAGNOSIS — IMO0002 Reserved for concepts with insufficient information to code with codable children: Secondary | ICD-10-CM

## 2013-09-09 DIAGNOSIS — E1165 Type 2 diabetes mellitus with hyperglycemia: Principal | ICD-10-CM

## 2013-09-09 MED ORDER — ACCU-CHEK NANO SMARTVIEW W/DEVICE KIT: 1.0000 | PACK | Freq: Two times a day (BID) | Status: DC

## 2013-09-09 MED ORDER — "INSULIN SYRINGE-NEEDLE U-100 31G X 15/64"" 0.3 ML MISC": Status: DC

## 2013-09-09 MED ORDER — ACCU-CHEK FASTCLIX LANCETS MISC
Status: DC
Start: 2013-09-09 — End: 2013-10-02

## 2013-09-09 MED ORDER — GLUCOSE BLOOD VI STRP: ORAL_STRIP | Status: DC

## 2013-09-09 NOTE — Telephone Encounter (Signed)
Patient received new accu check meter today and requests supplies. She also requested insulin syringe Rx.

## 2013-09-09 NOTE — Progress Notes (Signed)
Diabetes Self-Management Education  Visit Number: First/Initial  09/09/2013 [name], identified by name and date of birth, is a 58 y.o. female with Diabetes Type: Type 2.  Other people present during visit:      ASSESSMENT  Patient Concerns:  Monitoring;Glycemic Control  Weight 188 lb 4.8 oz (85.412 kg). Body mass index is 33.36 kg/(m^2).  Lab Results: LDL Cholesterol  Date Value Ref Range Status  09/03/2013 UNABLE TO CALCULATE IF TRIGLYCERIDE OVER 400 mg/dL  0 - 99 mg/dL Final                Hemoglobin A1C  Date Value Ref Range Status  09/03/2013 15.5* <5.7 % Final     (NOTE)                                                                               Hemoglobin-A1c  Date Value Ref Range Status  09/03/2008 9.7 (NOTE)  Diabetic Adult       <9.0                                             Healthy Adult   3.9 - 7.3                                        (DCCT/NGSP)                                       Current ADA guidelines recommend a treatment goal of <7.0%  HgbA1c for diabetic patients, which corresponds to a <9.0% Glycohemoglobin result with this method.  * <9.0 % Final     Family History  Problem Relation Age of Onset  . Arthritis Mother   . Diabetes Mother   . Hypertension Mother   . Kidney disease Mother   . Heart disease Mother     died 3  . Heart disease      mother & father  . Hypertension      mother & father  . Colon cancer Neg Hx   . Prostate cancer Neg Hx   . Breast cancer Neg Hx   . Stomach cancer Neg Hx   . Esophageal cancer Neg Hx   . Hypertension Sister   . Hypertension Brother   . Kidney disease Brother    History  Substance Use Topics  . Smoking status: Current Some Day Smoker -- 0.02 packs/day for 30 years    Types: Cigarettes  . Smokeless tobacco: Never Used     Comment: smokes 2 -3 cigs daily/uses patches.  . Alcohol Use: No     Comment: denied alcohol at this time    Support Systems:    need to assess at future visit Special  Needs:  Unable to determine  Prior DM Education:   need to assess at future visit Daily Foot Exams:  yes- patient reports doing daily, wants referral to  foot doctor for diabetic inserts Patient Belief / Attitude about Diabetes:  Diabetes can be controlled  Assessment comments: patient reports therapy appointment today for depression. Reports she eats more sweets when depressed. When asked what bothers her the most about her diabetes? She responded "Sticking herself all the time" .    Diet Recall:  Breakfast (9AM)- Chicken and rice, regular cup of coffee with equal and coffee mate creamer Lunch (1PM)- Steak and cucumbers, ice water After lunch snack- 1-2 Little debbie cakes Dinner (7PM)- Chicken and rice After [employer]- 1 Twinkie Bedtime snack (9-10PM)- 1 Orange, cheese, 6 oz. Pepsi   Individualized Plan for Diabetes Self-Management Training:  Patient individualized diabetes plan  discussed today with patient and includes: monitoring, how to handle highs and lows, Dealing daily with diabetes  Education Topics Reviewed with Patient Today:  Topic Points Discussed  Disease State    Nutrition Management    Physical Activity and Exercise    Medications Taught/evaluated insulin injection, site rotation, insulin storage and needle disposal.  Monitoring Taught/evaluated SMBG with accu check Nano meter.;Purpose and frequency of SMBG.  Acute Complications    Chronic Complications    Psychosocial Adjustment    Goal Setting    Preconception Care (if applicable)      PATIENTS GOALS   Learning Objective(s):     Goal The patient agrees to:  Nutrition    Physical Activity    Medications    Monitoring    Problem Solving    Reducing Risk    Health Coping Make appointment to continue diabetes assessment and training.    Patient Self-Evaluation of Goals (Subsequent Visits)  Goal The patient rates self as meeting goals (% of time)  Nutrition    Physical Activity    Medications     Monitoring    Problem Solving    Reducing Risk    Health Coping       PERSONALIZED PLAN / SUPPORT  Self-Management Support:  Doctor's office;CDE visits (make an apopintment with CDE and follow up)  ______________________________________________________________________  Outcomes  Expected Outcomes:  Demonstrated interest in learning. Expect positive outcomes Self-Care Barriers:  Impaired vision;Lack of transportation;Lack of material resources;Other (comment) Education material provided: no If problems or questions, patient to contact team via:  Phone Time in: 0920     Time out: 1015 Future DSME appointment: - 2 wks   Chauncey Reading Plyler 09/09/2013 11:11 AM

## 2013-09-11 ENCOUNTER — Telehealth: Payer: Self-pay | Admitting: Neurology

## 2013-09-11 ENCOUNTER — Ambulatory Visit: Payer: Medicare Other | Admitting: Neurology

## 2013-09-11 NOTE — Telephone Encounter (Signed)
This patient did not show for a new patient appointment today. 

## 2013-09-22 ENCOUNTER — Ambulatory Visit (INDEPENDENT_AMBULATORY_CARE_PROVIDER_SITE_OTHER): Payer: Medicare Other | Admitting: Neurology

## 2013-09-22 ENCOUNTER — Encounter: Payer: Self-pay | Admitting: Neurology

## 2013-09-22 ENCOUNTER — Telehealth: Payer: Self-pay | Admitting: Neurology

## 2013-09-22 VITALS — BP 161/95 | HR 77 | Ht 64.0 in | Wt 185.0 lb

## 2013-09-22 DIAGNOSIS — R51 Headache: Secondary | ICD-10-CM

## 2013-09-22 MED ORDER — TOPIRAMATE 25 MG PO TABS: ORAL_TABLET | ORAL | Status: DC

## 2013-09-22 MED ORDER — ISOMETHEPTENE-APAP-DICHLORAL 65-325-100 MG PO CAPS: 1.0000 | ORAL_CAPSULE | Freq: Four times a day (QID) | ORAL | Status: DC | PRN

## 2013-09-22 NOTE — Telephone Encounter (Signed)
Pt called needs a letter stating she was here today in our office faxed over to [facility] [phone] option 2. to get fax #. Thanks

## 2013-09-22 NOTE — Patient Instructions (Signed)
    Topamax (topiramate) is a seizure medication that has an FDA approval for seizures and for migraine headache. Potential side effects of this medication include weight loss, cognitive slowing, tingling in the fingers and toes, and carbonated drinks will taste bad. If any significant side effects are noted on this drug, please contact our office.    Migraine Headache A migraine headache is an intense, throbbing pain on one or both sides of your head. A migraine can last for 30 minutes to several hours. CAUSES  The exact cause of a migraine headache is not always known. However, a migraine may be caused when nerves in the brain become irritated and release chemicals that cause inflammation. This causes pain. Certain things may also trigger migraines, such as:  Alcohol.  Smoking.  Stress.  Menstruation.  Aged cheeses.  Foods or drinks that contain nitrates, glutamate, aspartame, or tyramine.  Lack of sleep.  Chocolate.  Caffeine.  Hunger.  Physical exertion.  Fatigue.  Medicines used to treat chest pain (nitroglycerine), birth control pills, estrogen, and some blood pressure medicines. SIGNS AND SYMPTOMS  Pain on one or both sides of your head.  Pulsating or throbbing pain.  Severe pain that prevents daily activities.  Pain that is aggravated by any physical activity.  Nausea, vomiting, or both.  Dizziness.  Pain with exposure to bright lights, loud noises, or activity.  General sensitivity to bright lights, loud noises, or smells. Before you get a migraine, you may get warning signs that a migraine is coming (aura). An aura may include:  Seeing flashing lights.  Seeing bright spots, halos, or zig-zag lines.  Having tunnel vision or blurred vision.  Having feelings of numbness or tingling.  Having trouble talking.  Having muscle weakness. DIAGNOSIS  A migraine headache is often diagnosed based on:  Symptoms.  Physical exam.  A CT scan or MRI  of your head. These imaging tests cannot diagnose migraines, but they can help rule out other causes of headaches. TREATMENT Medicines may be given for pain and nausea. Medicines can also be given to help prevent recurrent migraines.  HOME CARE INSTRUCTIONS  Only take over-the-counter or prescription medicines for pain or discomfort as directed by your health care provider. The use of long-term narcotics is not recommended.  Lie down in a dark, quiet room when you have a migraine.  Keep a journal to find out what may trigger your migraine headaches. For example, write down:  What you eat and drink.  How much sleep you get.  Any change to your diet or medicines.  Limit alcohol consumption.  Quit smoking if you smoke.  Get 7 9 hours of sleep, or as recommended by your health care provider.  Limit stress.  Keep lights dim if bright lights bother you and make your migraines worse. SEEK IMMEDIATE MEDICAL CARE IF:   Your migraine becomes severe.  You have a fever.  You have a stiff neck.  You have vision loss.  You have muscular weakness or loss of muscle control.  You start losing your balance or have trouble walking.  You feel faint or pass out.  You have severe symptoms that are different from your first symptoms. MAKE SURE YOU:   Understand these instructions.  Will watch your condition.  Will get help right away if you are not doing well or get worse. Document Released: 05/14/2005 Document Revised: 03/04/2013 Document Reviewed: 01/19/2013 [facility] Patient Information 2014 [place].

## 2013-09-22 NOTE — Telephone Encounter (Signed)
Called pt to inform her that a letter was faxed over to [facility], confirmation received. I advised the pt that if she has any other problems, questions or concerns to call the office. Pt verbalized understanding.

## 2013-09-22 NOTE — Progress Notes (Signed)
Reason for visit: Headache  [name] is a 58 y.o. female  History of present illness:  [name] is a 58 year old right-handed white female with a history of headaches that she claims began in 2009. The patient has been to the emergency room frequently for headaches recently. The patient has a history of cocaine abuse, and the most recent urine drug screen done on 09/02/2013 was positive for cocaine. The patient was on oxycodone for pain, but her primary care physician decided not to continue prescribing this medication. The patient indicates that her headaches are currently having a frequency of almost every other day, and are in the bifrontal areas with retro-orbital pain and some occipital pain. She notes some throbbing sensations with the headache, associated with some scalp tenderness. She does indicate that certain activating factors such as citrus type odors may cause headache. Sound and light bother her during the headache. She may have nausea and some vomiting with the headache. She denies any neck stiffness. She will note some problems with blurred vision, but no loss of vision or flashing lights. There was a recent stroke workup that she presented with right-sided weakness and numbness. MRI of the brain was done, and did not show an acute stroke. Again, she was positive for cocaine at the time of the workup. A CT angiogram of the head and neck was done, and did not show critical stenosis. She is sent to this office for further evaluation and management up for headache. She is on Depakote for psychiatric reasons currently.  Past Medical History  Diagnosis Date  . Asthma     past 10 years  . Diabetes mellitus     past 5 years  . Glaucoma     dx 06/2011-[facility]  . Hypertension   . Hyperlipidemia   . Hyperthyroidism   . History of TIA (transient ischemic attack)     01/06/2011, x2  . Pancreatitis   . Gout   . Depression   . Renal disorder     chronic kidney  disease, stage 3  . Chronic pain   . Glaucoma   . Arthritis     djd -lower back, left knee  . Degenerative disc disease   . Bipolar disorder   . Gastritis   . COPD (chronic obstructive pulmonary disease)   . Schizophrenia   . 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 Fatty liver     mild CT 06/2012  . DKA (diabetic ketoacidoses) 07/21/2013  . Stroke     TIAs  . Esophageal ring, acquired 08/12/2013  . Cocaine abuse     Past Surgical History  Procedure Laterality Date  . Abdominal hysterectomy  1990    still has ovaries   . Colonoscopy    . Refractive surgery      both eyes    Family History  Problem Relation Age of Onset  . Arthritis Mother   . Diabetes Mother   . Hypertension Mother   . Kidney disease Mother   . Heart disease Mother     died 54  . Heart disease      mother & father  . Hypertension      mother & father  . Colon cancer Neg Hx   . Prostate cancer Neg Hx   . Breast cancer Neg Hx   . Stomach cancer Neg Hx   . Esophageal cancer Neg Hx   . Migraines Neg Hx   . Hypertension Sister   .  Hypertension Brother   . Kidney disease Brother     Social history:  reports that she has been smoking Cigarettes.  She has a .6 pack-year smoking history. She has never used smokeless tobacco. She reports that she uses illicit drugs (Cocaine). She reports that she does not drink alcohol.  Medications:  Current Outpatient Prescriptions on File Prior to Visit  Medication Sig Dispense Refill  . ACCU-CHEK FASTCLIX LANCETS MISC Check blood sugar 4 times a day Dx code- 250.00  102 each  5  . albuterol (PROVENTIL HFA;VENTOLIN HFA) 108 (90 BASE) MCG/ACT inhaler Inhale 2 puffs into the lungs every 6 (six) hours as needed for wheezing.  1 Inhaler  2  . albuterol (PROVENTIL) (2.5 MG/3ML) 0.083% nebulizer solution Inhale 2.5 mg into the lungs every 6 (six) hours as needed for wheezing.      Marland Kitchen allopurinol (ZYLOPRIM) 300 MG tablet Take 300 mg by mouth daily. For gout.      Marland Kitchen  ALPRAZolam (XANAX) 0.5 MG tablet Take 1 tablet (0.5 mg total) by mouth at bedtime as needed for anxiety.  10 tablet  0  . amLODipine (NORVASC) 5 MG tablet Take 5 mg by mouth daily. For hypertension.      Marland Kitchen aspirin EC 325 MG EC tablet Take 1 tablet (325 mg total) by mouth daily.  30 tablet  0  . atorvastatin (LIPITOR) 80 MG tablet Take 1 tablet (80 mg total) by mouth every morning.  30 tablet  2  . Blood Glucose Monitoring Suppl (ACCU-CHEK NANO SMARTVIEW) W/DEVICE KIT 1 each by Does not apply route 2 (two) times daily.  1 kit  0  . colchicine 0.6 MG tablet Take 1 tablet (0.6 mg total) by mouth daily as needed (for gout flare-ups).      . divalproex (DEPAKOTE) 500 MG DR tablet Take 1,000 mg by mouth at bedtime.       . gabapentin (NEURONTIN) 100 MG capsule Take 3 capsules (300 mg total) by mouth 3 (three) times daily. Take 3 (158m) capsules for a dose of (3093m three times each day for neuropathic pain and anxiety.  180 capsule  3  . gemfibrozil (LOPID) 600 MG tablet Take 600 mg by mouth 2 (two) times daily before a meal.      . glucose blood (ACCU-CHEK SMARTVIEW) test strip Check blood sugar 4 times a day Dx code- 250.00  150 each  6  . insulin aspart (NOVOLOG) 100 UNIT/ML injection Inject 3 Units into the skin 3 (three) times daily with meals.  10 mL  11  . insulin glargine (LANTUS) 100 UNIT/ML injection Inject 0.1 mLs (10 Units total) into the skin at bedtime.  10 mL  11  . Insulin Syringe-Needle U-100 31G X 15/64" 0.3 ML MISC Check blood sugar 4 times a day Dx code- 250.00  200 each  5  . latanoprost (XALATAN) 0.005 % ophthalmic solution Place 1 drop into both eyes at bedtime.  2.5 mL  0  . levothyroxine (SYNTHROID, LEVOTHROID) 50 MCG tablet Take 1 tablet (50 mcg total) by mouth daily before breakfast.  30 tablet  2  . lisinopril (PRINIVIL,ZESTRIL) 40 MG tablet Take 1 tablet (40 mg total) by mouth daily.  30 tablet  3  . mometasone-formoterol (DULERA) 100-5 MCG/ACT AERO Inhale 2 puffs into the  lungs 2 (two) times daily.  1 Inhaler    . nitroGLYCERIN (NITROSTAT) 0.4 MG SL tablet Place 1 tablet (0.4 mg total) under the tongue every 5 (five) minutes as  needed for chest pain.  30 tablet  3  . ondansetron (ZOFRAN ODT) 8 MG disintegrating tablet Take 1 tablet (8 mg total) by mouth every 8 (eight) hours as needed for nausea or vomiting.  12 tablet  0  . pantoprazole (PROTONIX) 40 MG tablet Take 1 tablet (40 mg total) by mouth daily. For reflux.  30 tablet  11  . sertraline (ZOLOFT) 100 MG tablet Take 1 tablet (100 mg total) by mouth daily.  30 tablet  0  . traMADol (ULTRAM) 50 MG tablet Take 1 tablet (50 mg total) by mouth every 6 (six) hours as needed.  20 tablet  0   No current facility-administered medications on file prior to visit.      Allergies  Allergen Reactions  . Acetaminophen Nausea And Vomiting  . Hydrocodone-Acetaminophen Hives    Vicodin  . Metformin Diarrhea    ROS:  Out of a complete 14 system review of symptoms, the patient complains only of the following symptoms, and all other reviewed systems are negative.  Weight loss, fatigue Chest pain Dizziness Blurred vision, eye pain Wheezing, snoring Constipation Anemia Feeling cold Joint pain, muscle cramps Runny nose Headache, weakness, slurred speech  Blood pressure 161/95, pulse 77, height 5' 4"  (1.626 m), weight 185 lb (83.915 kg).  Physical Exam  General: The patient is alert and cooperative at the time of the examination.  Eyes: Pupils are equal, round, and reactive to light. Discs are flat bilaterally.  Neck: The neck is supple, no carotid bruits are noted.  Respiratory: The respiratory examination is clear.  Cardiovascular: The cardiovascular examination reveals a regular rate and rhythm, no obvious murmurs or rubs are noted.  Neuromuscular: Range of movement of the cervical spine is full. No crepitus is noted in the temporomandibular joints.  Skin: Extremities are without significant  edema.  Neurologic Exam  Mental status: The patient is alert and oriented x 3 at the time of the examination. The patient has apparent normal recent and remote memory, with an apparently normal attention span and concentration ability.  Cranial nerves: Facial symmetry is present. There is good sensation of the face to pinprick and soft touch bilaterally. The patient splits the midline with vibration sensation on the forehead, decreased on the left relative to the right. The strength of the facial muscles and the muscles to head turning and shoulder shrug are normal bilaterally. Speech is well enunciated, no aphasia or dysarthria is noted. Extraocular movements are full. Visual fields are full. The tongue is midline, and the patient has symmetric elevation of the soft palate. No obvious hearing deficits are noted.  Motor: The motor testing reveals 5 over 5 strength of all 4 extremities. Good symmetric motor tone is noted throughout.  Sensory: Sensory testing is intact to pinprick, soft touch, vibration sensation, and position sense on all 4 extremities, with the exception that there is some decrease in pinprick sensation on the left arm and leg, and decreased vibration sensation on the left leg relative the right. No evidence of extinction is noted.  Coordination: Cerebellar testing reveals good finger-nose-finger and heel-to-shin bilaterally.  Gait and station: Gait is normal. Tandem gait is normal. Romberg is negative. No drift is seen.  Reflexes: Deep tendon reflexes are symmetric and normal bilaterally. Toes are downgoing bilaterally.   MRI brain 09/03/13:  IMPRESSION:  Mild atrophy and small vessel disease. Partial empty sella. No acute  intracranial findings.     Assessment/Plan:  1. Migraine headache  2. History of cocaine  abuse  3. Diabetes  4. Hypertension  5. Nonorganic neurologic examination  6. Bipolar disorder  This patient has a history of cocaine abuse, and  therefore she is not a candidate for prescriptions for controlled substances such as opiates. The patient will be given a prescription for Topamax, and she will be given Midrin to take if needed for the headache. In the future, she may be a candidate for Botox. She will followup in about 4 months.  Jill Alexanders MD 09/22/2013 9:37 AM  [facility] [address] [place], [place] [address]  Phone [phone] Fax [phone]

## 2013-10-01 ENCOUNTER — Observation Stay (HOSPITAL_COMMUNITY)
Admission: EM | Admit: 2013-10-01 | Discharge: 2013-10-02 | Disposition: A | Discharge status: Home / Self Care | Payer: PRIVATE HEALTH INSURANCE | Attending: Internal Medicine | Admitting: Internal Medicine

## 2013-10-01 ENCOUNTER — Encounter (HOSPITAL_COMMUNITY): Payer: Self-pay | Admitting: Emergency Medicine

## 2013-10-01 ENCOUNTER — Emergency Department (HOSPITAL_COMMUNITY): Payer: PRIVATE HEALTH INSURANCE

## 2013-10-01 DIAGNOSIS — N183 Chronic kidney disease, stage 3 unspecified: Secondary | ICD-10-CM | POA: Insufficient documentation

## 2013-10-01 DIAGNOSIS — R079 Chest pain, unspecified: Secondary | ICD-10-CM | POA: Insufficient documentation

## 2013-10-01 DIAGNOSIS — E1149 Type 2 diabetes mellitus with other diabetic neurological complication: Secondary | ICD-10-CM | POA: Diagnosis present

## 2013-10-01 DIAGNOSIS — M5137 Other intervertebral disc degeneration, lumbosacral region: Secondary | ICD-10-CM | POA: Insufficient documentation

## 2013-10-01 DIAGNOSIS — Z794 Long term (current) use of insulin: Secondary | ICD-10-CM | POA: Insufficient documentation

## 2013-10-01 DIAGNOSIS — K7689 Other specified diseases of liver: Secondary | ICD-10-CM | POA: Insufficient documentation

## 2013-10-01 DIAGNOSIS — E118 Type 2 diabetes mellitus with unspecified complications: Principal | ICD-10-CM | POA: Insufficient documentation

## 2013-10-01 DIAGNOSIS — F418 Other specified anxiety disorders: Secondary | ICD-10-CM | POA: Diagnosis present

## 2013-10-01 DIAGNOSIS — I129 Hypertensive chronic kidney disease with stage 1 through stage 4 chronic kidney disease, or unspecified chronic kidney disease: Secondary | ICD-10-CM | POA: Insufficient documentation

## 2013-10-01 DIAGNOSIS — J45909 Unspecified asthma, uncomplicated: Secondary | ICD-10-CM | POA: Insufficient documentation

## 2013-10-01 DIAGNOSIS — IMO0002 Reserved for concepts with insufficient information to code with codable children: Secondary | ICD-10-CM | POA: Diagnosis present

## 2013-10-01 DIAGNOSIS — R109 Unspecified abdominal pain: Secondary | ICD-10-CM | POA: Insufficient documentation

## 2013-10-01 DIAGNOSIS — Z7982 Long term (current) use of aspirin: Secondary | ICD-10-CM | POA: Insufficient documentation

## 2013-10-01 DIAGNOSIS — Z860100 Personal history of colon polyps, unspecified: Secondary | ICD-10-CM | POA: Insufficient documentation

## 2013-10-01 DIAGNOSIS — E039 Hypothyroidism, unspecified: Secondary | ICD-10-CM | POA: Diagnosis present

## 2013-10-01 DIAGNOSIS — G8929 Other chronic pain: Secondary | ICD-10-CM | POA: Diagnosis present

## 2013-10-01 DIAGNOSIS — E1165 Type 2 diabetes mellitus with hyperglycemia: Secondary | ICD-10-CM

## 2013-10-01 DIAGNOSIS — M51379 Other intervertebral disc degeneration, lumbosacral region without mention of lumbar back pain or lower extremity pain: Secondary | ICD-10-CM | POA: Insufficient documentation

## 2013-10-01 DIAGNOSIS — R739 Hyperglycemia, unspecified: Secondary | ICD-10-CM | POA: Diagnosis present

## 2013-10-01 DIAGNOSIS — F209 Schizophrenia, unspecified: Secondary | ICD-10-CM | POA: Diagnosis present

## 2013-10-01 DIAGNOSIS — Q393 Congenital stenosis and stricture of esophagus: Secondary | ICD-10-CM

## 2013-10-01 DIAGNOSIS — M549 Dorsalgia, unspecified: Secondary | ICD-10-CM

## 2013-10-01 DIAGNOSIS — J449 Chronic obstructive pulmonary disease, unspecified: Secondary | ICD-10-CM | POA: Diagnosis present

## 2013-10-01 DIAGNOSIS — E785 Hyperlipidemia, unspecified: Secondary | ICD-10-CM | POA: Insufficient documentation

## 2013-10-01 DIAGNOSIS — F319 Bipolar disorder, unspecified: Secondary | ICD-10-CM | POA: Insufficient documentation

## 2013-10-01 DIAGNOSIS — M109 Gout, unspecified: Secondary | ICD-10-CM | POA: Diagnosis present

## 2013-10-01 DIAGNOSIS — Z8673 Personal history of transient ischemic attack (TIA), and cerebral infarction without residual deficits: Secondary | ICD-10-CM | POA: Insufficient documentation

## 2013-10-01 DIAGNOSIS — H409 Unspecified glaucoma: Secondary | ICD-10-CM | POA: Insufficient documentation

## 2013-10-01 DIAGNOSIS — Q391 Atresia of esophagus with tracheo-esophageal fistula: Secondary | ICD-10-CM | POA: Insufficient documentation

## 2013-10-01 DIAGNOSIS — F17200 Nicotine dependence, unspecified, uncomplicated: Secondary | ICD-10-CM | POA: Insufficient documentation

## 2013-10-01 DIAGNOSIS — M1710 Unilateral primary osteoarthritis, unspecified knee: Secondary | ICD-10-CM | POA: Insufficient documentation

## 2013-10-01 DIAGNOSIS — N390 Urinary tract infection, site not specified: Secondary | ICD-10-CM

## 2013-10-01 HISTORY — DX: Other chronic pain: G89.29

## 2013-10-01 HISTORY — DX: Unspecified abdominal pain: R10.9

## 2013-10-01 LAB — COMPREHENSIVE METABOLIC PANEL
ALK PHOS: 136 U/L — AB (ref 39–117)
ALT: 21 U/L (ref 0–35)
AST: 29 U/L (ref 0–37)
Albumin: 3.5 g/dL (ref 3.5–5.2)
BILIRUBIN TOTAL: 0.2 mg/dL — AB (ref 0.3–1.2)
BUN: 15 mg/dL (ref 6–23)
CHLORIDE: 92 meq/L — AB (ref 96–112)
CO2: 26 meq/L (ref 19–32)
Calcium: 9.2 mg/dL (ref 8.4–10.5)
Creatinine, Ser: 1.09 mg/dL (ref 0.50–1.10)
GFR, EST AFRICAN AMERICAN: 65 mL/min — AB (ref >90)
GFR, EST NON AFRICAN AMERICAN: 56 mL/min — AB (ref >90)
GLUCOSE: 585 mg/dL — AB (ref 70–99)
Potassium: 4.6 mEq/L (ref 3.7–5.3)
SODIUM: 133 meq/L — AB (ref 137–147)
Total Protein: 7.1 g/dL (ref 6.0–8.3)

## 2013-10-01 LAB — CBC
HCT: 38.2 % (ref 36.0–46.0)
HCT: 37.5 % (ref 36.0–46.0)
HEMOGLOBIN: 12.5 g/dL (ref 12.0–15.0)
Hemoglobin: 12.8 g/dL (ref 12.0–15.0)
MCH: 29.4 pg (ref 26.0–34.0)
MCH: 29.1 pg (ref 26.0–34.0)
MCHC: 33.5 g/dL (ref 30.0–36.0)
MCHC: 33.3 g/dL (ref 30.0–36.0)
MCV: 87.6 fL (ref 78.0–100.0)
MCV: 87.4 fL (ref 78.0–100.0)
PLATELETS: 253 10*3/uL (ref 150–400)
Platelets: 241 10*3/uL (ref 150–400)
RBC: 4.36 MIL/uL (ref 3.87–5.11)
RBC: 4.29 MIL/uL (ref 3.87–5.11)
RDW: 14.6 % (ref 11.5–15.5)
RDW: 14.7 % (ref 11.5–15.5)
WBC: 6.7 10*3/uL (ref 4.0–10.5)
WBC: 6.5 10*3/uL (ref 4.0–10.5)

## 2013-10-01 LAB — CBG MONITORING, ED
GLUCOSE-CAPILLARY: 590 mg/dL — AB (ref 70–99)
Glucose-Capillary: 463 mg/dL — ABNORMAL HIGH (ref 70–99)
Glucose-Capillary: 465 mg/dL — ABNORMAL HIGH (ref 70–99)
Glucose-Capillary: 405 mg/dL — ABNORMAL HIGH (ref 70–99)

## 2013-10-01 LAB — URINALYSIS, ROUTINE W REFLEX MICROSCOPIC
BILIRUBIN URINE: NEGATIVE
Glucose, UA: >1000 mg/dL — AB
HGB URINE DIPSTICK: NEGATIVE
KETONES UR: NEGATIVE mg/dL
NITRITE: NEGATIVE
PROTEIN: NEGATIVE mg/dL
Specific Gravity, Urine: 1.03 (ref 1.005–1.030)
UROBILINOGEN UA: 0.2 mg/dL (ref 0.0–1.0)
pH: 6.5 (ref 5.0–8.0)

## 2013-10-01 LAB — LIPASE, BLOOD: Lipase: 31 U/L (ref 11–59)

## 2013-10-01 LAB — URINE MICROSCOPIC-ADD ON

## 2013-10-01 LAB — I-STAT ARTERIAL BLOOD GAS, ED
Acid-Base Excess: 1 mmol/L (ref 0.0–2.0)
Bicarbonate: 26.5 mEq/L — ABNORMAL HIGH (ref 20.0–24.0)
O2 SAT: 94 %
PO2 ART: 71 mmHg — AB (ref 80.0–100.0)
TCO2: 28 mmol/L (ref 0–100)
pCO2 arterial: 43.4 mmHg (ref 35.0–45.0)
pH, Arterial: 7.392 (ref 7.350–7.450)

## 2013-10-01 LAB — RAPID URINE DRUG SCREEN, HOSP PERFORMED
Amphetamines: NOT DETECTED
BARBITURATES: NOT DETECTED
BENZODIAZEPINES: NOT DETECTED
COCAINE: NOT DETECTED
OPIATES: NOT DETECTED
Tetrahydrocannabinol: NOT DETECTED

## 2013-10-01 LAB — CREATININE, SERUM
Creatinine, Ser: 0.94 mg/dL (ref 0.50–1.10)
GFR calc non Af Amer: 67 mL/min — ABNORMAL LOW (ref >90)
GFR, EST AFRICAN AMERICAN: 78 mL/min — AB (ref >90)

## 2013-10-01 LAB — TROPONIN I: Troponin I: <0.3 ng/mL (ref <0.30)

## 2013-10-01 LAB — GLUCOSE, CAPILLARY
GLUCOSE-CAPILLARY: 320 mg/dL — AB (ref 70–99)
Glucose-Capillary: 291 mg/dL — ABNORMAL HIGH (ref 70–99)

## 2013-10-01 MED ORDER — GI COCKTAIL ~~LOC~~: 30.0000 mL | Freq: Two times a day (BID) | ORAL | Status: DC | PRN

## 2013-10-01 MED ORDER — MORPHINE SULFATE 4 MG/ML IJ SOLN
4.0000 mg | INTRAMUSCULAR | Status: AC | PRN
  Administered 2013-10-01 (×2): 4 mg via INTRAVENOUS
  Filled 2013-10-01 (×2): qty 1

## 2013-10-01 MED ORDER — MOMETASONE FURO-FORMOTEROL FUM 100-5 MCG/ACT IN AERO
2.0000 | INHALATION_SPRAY | Freq: Two times a day (BID) | RESPIRATORY_TRACT | Status: DC
  Administered 2013-10-01 – 2013-10-02 (×2): 2 via RESPIRATORY_TRACT
  Filled 2013-10-01: qty 8.8

## 2013-10-01 MED ORDER — LISINOPRIL 40 MG PO TABS
40.0000 mg | ORAL_TABLET | Freq: Every day | ORAL | Status: DC
  Administered 2013-10-02: 40 mg via ORAL
  Filled 2013-10-01: qty 1

## 2013-10-01 MED ORDER — SERTRALINE HCL 100 MG PO TABS
100.0000 mg | ORAL_TABLET | Freq: Every day | ORAL | Status: DC
  Administered 2013-10-02: 100 mg via ORAL
  Filled 2013-10-01: qty 1

## 2013-10-01 MED ORDER — SODIUM CHLORIDE 0.9 % IV BOLUS (SEPSIS)
2000.0000 mL | Freq: Once | INTRAVENOUS | Status: AC
  Administered 2013-10-01: 2000 mL via INTRAVENOUS

## 2013-10-01 MED ORDER — SODIUM CHLORIDE 0.9 % IV SOLN
INTRAVENOUS | Status: DC
  Administered 2013-10-01: 4.1 [IU]/h via INTRAVENOUS
  Filled 2013-10-01: qty 1

## 2013-10-01 MED ORDER — SODIUM CHLORIDE 0.9 % IV SOLN
INTRAVENOUS | Status: DC
  Administered 2013-10-01: 1000 mL via INTRAVENOUS
  Administered 2013-10-02 (×2): via INTRAVENOUS

## 2013-10-01 MED ORDER — INSULIN GLARGINE 100 UNIT/ML ~~LOC~~ SOLN: 10.0000 [IU] | Freq: Every day | SUBCUTANEOUS | Status: DC

## 2013-10-01 MED ORDER — ONDANSETRON HCL 4 MG/2ML IJ SOLN: 4.0000 mg | Freq: Three times a day (TID) | INTRAMUSCULAR | Status: DC | PRN

## 2013-10-01 MED ORDER — ONDANSETRON HCL 4 MG/2ML IJ SOLN: 4.0000 mg | Freq: Four times a day (QID) | INTRAMUSCULAR | Status: DC | PRN

## 2013-10-01 MED ORDER — INSULIN REGULAR BOLUS VIA INFUSION
0.0000 [IU] | Freq: Three times a day (TID) | INTRAVENOUS | Status: DC
  Filled 2013-10-01: qty 10

## 2013-10-01 MED ORDER — ACETAMINOPHEN 650 MG RE SUPP: 650.0000 mg | Freq: Four times a day (QID) | RECTAL | Status: DC | PRN

## 2013-10-01 MED ORDER — ASPIRIN 81 MG PO CHEW: 324.0000 mg | CHEWABLE_TABLET | Freq: Once | ORAL | Status: DC

## 2013-10-01 MED ORDER — DEXTROSE 5 % IV SOLN
1.0000 g | Freq: Once | INTRAVENOUS | Status: AC
  Administered 2013-10-01: 1 g via INTRAVENOUS
  Filled 2013-10-01: qty 10

## 2013-10-01 MED ORDER — GI COCKTAIL ~~LOC~~
30.0000 mL | Freq: Two times a day (BID) | ORAL | Status: DC | PRN
  Administered 2013-10-01: 30 mL via ORAL
  Filled 2013-10-01: qty 30

## 2013-10-01 MED ORDER — GABAPENTIN 300 MG PO CAPS
300.0000 mg | ORAL_CAPSULE | Freq: Two times a day (BID) | ORAL | Status: DC
  Administered 2013-10-01 – 2013-10-02 (×2): 300 mg via ORAL
  Filled 2013-10-01 (×3): qty 1

## 2013-10-01 MED ORDER — CLOTRIMAZOLE 1 % VA CREA
1.0000 | TOPICAL_CREAM | Freq: Every day | VAGINAL | Status: DC
  Administered 2013-10-01: 1 via VAGINAL
  Filled 2013-10-01: qty 45

## 2013-10-01 MED ORDER — GI COCKTAIL ~~LOC~~
30.0000 mL | Freq: Once | ORAL | Status: AC
  Administered 2013-10-01: 30 mL via ORAL
  Filled 2013-10-01: qty 30

## 2013-10-01 MED ORDER — LEVOTHYROXINE SODIUM 50 MCG PO TABS
50.0000 ug | ORAL_TABLET | Freq: Every day | ORAL | Status: DC
  Administered 2013-10-02: 50 ug via ORAL
  Filled 2013-10-01 (×2): qty 1

## 2013-10-01 MED ORDER — SODIUM CHLORIDE 0.9 % IJ SOLN: 3.0000 mL | Freq: Two times a day (BID) | INTRAMUSCULAR | Status: DC

## 2013-10-01 MED ORDER — GEMFIBROZIL 600 MG PO TABS
600.0000 mg | ORAL_TABLET | Freq: Every day | ORAL | Status: DC
  Administered 2013-10-02: 600 mg via ORAL
  Filled 2013-10-01: qty 1

## 2013-10-01 MED ORDER — ALBUTEROL SULFATE (2.5 MG/3ML) 0.083% IN NEBU: 2.5000 mg | INHALATION_SOLUTION | Freq: Four times a day (QID) | RESPIRATORY_TRACT | Status: DC | PRN

## 2013-10-01 MED ORDER — ATORVASTATIN CALCIUM 80 MG PO TABS
80.0000 mg | ORAL_TABLET | Freq: Every day | ORAL | Status: DC
  Administered 2013-10-02: 80 mg via ORAL
  Filled 2013-10-01: qty 1

## 2013-10-01 MED ORDER — DEXTROSE 50 % IV SOLN: 25.0000 mL | INTRAVENOUS | Status: DC | PRN

## 2013-10-01 MED ORDER — HYDROCODONE-ACETAMINOPHEN 5-325 MG PO TABS: 1.0000 | ORAL_TABLET | ORAL | Status: DC | PRN

## 2013-10-01 MED ORDER — ACETAMINOPHEN 325 MG PO TABS: 650.0000 mg | ORAL_TABLET | Freq: Four times a day (QID) | ORAL | Status: DC | PRN

## 2013-10-01 MED ORDER — ASPIRIN EC 81 MG PO TBEC
81.0000 mg | DELAYED_RELEASE_TABLET | Freq: Every day | ORAL | Status: DC
  Administered 2013-10-02: 81 mg via ORAL
  Filled 2013-10-01: qty 1

## 2013-10-01 MED ORDER — DEXTROSE-NACL 5-0.45 % IV SOLN: INTRAVENOUS | Status: DC

## 2013-10-01 MED ORDER — ONDANSETRON HCL 4 MG PO TABS: 4.0000 mg | ORAL_TABLET | Freq: Four times a day (QID) | ORAL | Status: DC | PRN

## 2013-10-01 MED ORDER — NITROGLYCERIN 0.4 MG SL SUBL: 0.4000 mg | SUBLINGUAL_TABLET | SUBLINGUAL | Status: DC | PRN

## 2013-10-01 MED ORDER — SODIUM CHLORIDE 0.9 % IV SOLN
INTRAVENOUS | Status: AC
  Administered 2013-10-01: 16:00:00 via INTRAVENOUS

## 2013-10-01 MED ORDER — MORPHINE SULFATE 2 MG/ML IJ SOLN
2.0000 mg | INTRAMUSCULAR | Status: DC | PRN
  Administered 2013-10-01 – 2013-10-02 (×5): 2 mg via INTRAVENOUS
  Filled 2013-10-01 (×5): qty 1

## 2013-10-01 MED ORDER — HEPARIN SODIUM (PORCINE) 5000 UNIT/ML IJ SOLN
5000.0000 [IU] | Freq: Three times a day (TID) | INTRAMUSCULAR | Status: DC
  Administered 2013-10-01 – 2013-10-02 (×3): 5000 [IU] via SUBCUTANEOUS
  Filled 2013-10-01 (×5): qty 1

## 2013-10-01 MED ORDER — AMLODIPINE BESYLATE 5 MG PO TABS
5.0000 mg | ORAL_TABLET | Freq: Every day | ORAL | Status: DC
  Administered 2013-10-02: 5 mg via ORAL
  Filled 2013-10-01: qty 1

## 2013-10-01 MED ORDER — INSULIN GLARGINE 100 UNIT/ML ~~LOC~~ SOLN
10.0000 [IU] | Freq: Every day | SUBCUTANEOUS | Status: DC
  Administered 2013-10-01: 10 [IU] via SUBCUTANEOUS
  Filled 2013-10-01: qty 0.1

## 2013-10-01 MED ORDER — ALLOPURINOL 300 MG PO TABS
300.0000 mg | ORAL_TABLET | Freq: Every day | ORAL | Status: DC
  Administered 2013-10-02: 300 mg via ORAL
  Filled 2013-10-01: qty 1

## 2013-10-01 MED ORDER — PANTOPRAZOLE SODIUM 40 MG PO TBEC
40.0000 mg | DELAYED_RELEASE_TABLET | Freq: Every day | ORAL | Status: DC
  Administered 2013-10-02: 40 mg via ORAL
  Filled 2013-10-01: qty 1

## 2013-10-01 MED ORDER — LATANOPROST 0.005 % OP SOLN
1.0000 [drp] | Freq: Two times a day (BID) | OPHTHALMIC | Status: DC
  Administered 2013-10-01: 1 [drp] via OPHTHALMIC
  Filled 2013-10-01: qty 2.5

## 2013-10-01 MED ORDER — HYDROMORPHONE HCL PF 1 MG/ML IJ SOLN: 1.0000 mg | INTRAMUSCULAR | Status: DC | PRN

## 2013-10-01 MED ORDER — INSULIN GLARGINE 100 UNIT/ML ~~LOC~~ SOLN
10.0000 [IU] | Freq: Every day | SUBCUTANEOUS | Status: DC
  Administered 2013-10-01: 10 [IU] via SUBCUTANEOUS
  Filled 2013-10-01 (×2): qty 0.1

## 2013-10-01 MED ORDER — INSULIN ASPART 100 UNIT/ML ~~LOC~~ SOLN
0.0000 [IU] | Freq: Three times a day (TID) | SUBCUTANEOUS | Status: DC
  Administered 2013-10-01: 15 [IU] via SUBCUTANEOUS
  Administered 2013-10-02: 7 [IU] via SUBCUTANEOUS
  Administered 2013-10-02: 11 [IU] via SUBCUTANEOUS

## 2013-10-01 MED ORDER — SODIUM CHLORIDE 0.9 % IV SOLN: INTRAVENOUS | Status: DC

## 2013-10-01 MED ORDER — DIVALPROEX SODIUM 500 MG PO DR TAB
1000.0000 mg | DELAYED_RELEASE_TABLET | Freq: Every day | ORAL | Status: DC
  Administered 2013-10-01: 1000 mg via ORAL
  Filled 2013-10-01 (×2): qty 2

## 2013-10-01 NOTE — ED Notes (Signed)
CBG is 463

## 2013-10-01 NOTE — Progress Notes (Signed)
Patient trasfered from ED to 5W20 via bed; alert and oriented x 4;IV  in LH intact, clean, dry; skin intact. Orient patient to room and unit;  gave patient care guide; instructed how to use the call bell and  fall risk precautions. Will continue to monitor the patient.

## 2013-10-01 NOTE — ED Notes (Signed)
Admitting MDs at bedside.

## 2013-10-01 NOTE — ED Provider Notes (Signed)
CSN: [id number]     Arrival date & time 10/01/13  1000 History   First MD Initiated Contact with Patient 10/01/13 1007     Chief Complaint  Patient presents with  . Hyperglycemia     HPI Pt was seen at 1020. Per pt, c/o gradual onset and worsening of persistent "high blood sugars" for the past 1 week. Pt states her home CBG's have been running in the "400's" for the past week and "over 500" today. States she feels "lightheaded." Endorses she has been taking her insulin as prescribed.  Pt also c/o gradual onset and persistence of constant lower chest/upper abd "pain" for the past 2 days. Describes the pain as radiating into her back, and as per her usual chronic "pancreatitis pain" pattern. EMS gave ASA and SL ntg x2 without improvement in her CP, but states "now I have a headache." Denies N/V/D, no fevers, no rash, no palpitations, no SOB/cough.     Past Medical History  Diagnosis Date  . Asthma     past 10 years  . Diabetes mellitus     past 5 years  . Glaucoma     dx 06/2011-[facility]  . Hypertension   . Hyperlipidemia   . Hyperthyroidism   . History of TIA (transient ischemic attack)     01/06/2011, x2  . Pancreatitis   . Gout   . Depression   . Renal disorder     chronic kidney disease, stage 3  . Chronic pain   . Glaucoma   . Arthritis     djd -lower back, left knee  . Degenerative disc disease   . Bipolar disorder   . Gastritis   . COPD (chronic obstructive pulmonary disease)   . Schizophrenia   . 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 Fatty liver     mild CT 06/2012  . DKA (diabetic ketoacidoses) 07/21/2013  . Stroke     TIAs  . Esophageal ring, acquired 08/12/2013  . Cocaine abuse   . Chronic abdominal pain    Past Surgical History  Procedure Laterality Date  . Abdominal hysterectomy  1990    still has ovaries   . Colonoscopy    . Refractive surgery      both eyes   Family History  Problem Relation Age of Onset  . Arthritis Mother    . Diabetes Mother   . Hypertension Mother   . Kidney disease Mother   . Heart disease Mother     died 47  . Heart disease      mother & father  . Hypertension      mother & father  . Colon cancer Neg Hx   . Prostate cancer Neg Hx   . Breast cancer Neg Hx   . Stomach cancer Neg Hx   . Esophageal cancer Neg Hx   . Migraines Neg Hx   . Hypertension Sister   . Hypertension Brother   . Kidney disease Brother    History  Substance Use Topics  . Smoking status: Current Every Day Smoker -- 0.02 packs/day for 30 years    Types: Cigarettes  . Smokeless tobacco: Never Used     Comment: smokes 2 -3 cigs daily/uses patches.  . Alcohol Use: No     Comment: denied alcohol at this time    Review of Systems ROS: Statement: All systems negative except as marked or noted in the HPI; Constitutional: Negative for fever and chills. ; ; Eyes: Negative  for eye pain, redness and discharge. ; ; ENMT: Negative for ear pain, hoarseness, nasal congestion, sinus pressure and sore throat. ; ; Cardiovascular: +CP. Negative for palpitations, diaphoresis, dyspnea and peripheral edema. ; ; Respiratory: Negative for cough, wheezing and stridor. ; ; Gastrointestinal: +abd pain. Negative for nausea, vomiting, diarrhea, blood in stool, hematemesis, jaundice and rectal bleeding. . ; ; Genitourinary: Negative for dysuria, flank pain and hematuria. ; ; Musculoskeletal: Negative for back pain and neck pain. Negative for swelling and trauma.; ; Skin: Negative for pruritus, rash, abrasions, blisters, bruising and skin lesion.; ; Neuro: +lightheadedness. Negative for headache and neck stiffness. Negative for weakness, altered level of consciousness , altered mental status, extremity weakness, paresthesias, involuntary movement, seizure and syncope.      Allergies  Acetaminophen; Hydrocodone-acetaminophen; and Metformin  Home Medications   Prior to Admission medications   Medication Sig Start Date End Date Taking?  Authorizing Provider  albuterol (PROVENTIL HFA;VENTOLIN HFA) 108 (90 BASE) MCG/ACT inhaler Inhale 2 puffs into the lungs every 6 (six) hours as needed for wheezing. 01/23/13  Yes Jessee Avers, MD  albuterol (PROVENTIL) (2.5 MG/3ML) 0.083% nebulizer solution Inhale 2.5 mg into the lungs every 6 (six) hours as needed for wheezing. 07/22/13  Yes Otho Bellows, MD  allopurinol (ZYLOPRIM) 300 MG tablet Take 300 mg by mouth daily. For gout. 07/22/13  Yes Otho Bellows, MD  ALPRAZolam Duanne Moron) 0.5 MG tablet Take 0.5 mg by mouth 3 (three) times daily.   Yes Historical Provider, MD  amLODipine (NORVASC) 5 MG tablet Take 5 mg by mouth daily. For hypertension. 07/22/13  Yes Otho Bellows, MD  aspirin EC 81 MG tablet Take 81 mg by mouth daily.   Yes Historical Provider, MD  atorvastatin (LIPITOR) 80 MG tablet Take 80 mg by mouth daily.   Yes Historical Provider, MD  Brinzolamide-Brimonidine [facility]) 1-0.2 % SUSP Place 1 drop into both eyes 2 (two) times daily.   Yes Historical Provider, MD  divalproex (DEPAKOTE) 500 MG DR tablet Take 1,000 mg by mouth at bedtime.    Yes Historical Provider, MD  gabapentin (NEURONTIN) 100 MG capsule Take 300 mg by mouth 2 (two) times daily.   Yes Historical Provider, MD  gemfibrozil (LOPID) 600 MG tablet Take 600 mg by mouth daily.    Yes Historical Provider, MD  insulin aspart (NOVOLOG) 100 UNIT/ML injection Inject 4 Units into the skin 3 (three) times daily before meals.   Yes Historical Provider, MD  insulin glargine (LANTUS) 100 UNIT/ML injection Inject 10 Units into the skin at bedtime. 09/03/13  Yes Rebecca Eaton, MD  isometheptene-acetaminophen-dichloralphenazone (MIDRIN) 65-325-100 MG capsule Take 1 capsule by mouth 4 (four) times daily as needed for migraine. Maximum 5 capsules in 12 hours for migraine headaches, 8 capsules in 24 hours for tension headaches. 09/22/13  Yes Kathrynn Ducking, MD  latanoprost (XALATAN) 0.005 % ophthalmic solution Place 1 drop into both  eyes 2 (two) times daily. 04/13/13  Yes Nena Polio, PA-C  levothyroxine (SYNTHROID, LEVOTHROID) 50 MCG tablet Take 1 tablet (50 mcg total) by mouth daily before breakfast. 09/04/13  Yes Rebecca Eaton, MD  lisinopril (PRINIVIL,ZESTRIL) 40 MG tablet Take 1 tablet (40 mg total) by mouth daily. 09/03/13  Yes Rebecca Eaton, MD  mometasone-formoterol (DULERA) 100-5 MCG/ACT AERO Inhale 2 puffs into the lungs 2 (two) times daily. 04/13/13  Yes Nena Polio, PA-C  nitroGLYCERIN (NITROSTAT) 0.4 MG SL tablet Place 1 tablet (0.4 mg total) under the tongue every 5 (five) minutes as  needed for chest pain. 11/09/12  Yes Ivor Costa, MD  ondansetron (ZOFRAN ODT) 8 MG disintegrating tablet Take 1 tablet (8 mg total) by mouth every 8 (eight) hours as needed for nausea or vomiting. 08/31/13  Yes Hoy Morn, MD  pantoprazole (PROTONIX) 40 MG tablet Take 1 tablet (40 mg total) by mouth daily. For reflux. 04/13/13  Yes Nena Polio, PA-C  sertraline (ZOLOFT) 100 MG tablet Take 1 tablet (100 mg total) by mouth daily. 08/18/13  Yes Clinton Gallant, MD  topiramate (TOPAMAX) 25 MG tablet Take one tablet at night for one week, then take 2 tablets at night for one week, then take 3 tablets at night. 09/22/13  Yes Kathrynn Ducking, MD  traMADol (ULTRAM) 50 MG tablet Take 1 tablet (50 mg total) by mouth every 6 (six) hours as needed. 09/07/13 09/07/14 Yes Rebecca Eaton, MD  ACCU-CHEK FASTCLIX LANCETS MISC Check blood sugar 4 times a day Dx code- 250.00 09/09/13   Rebecca Eaton, MD  Blood Glucose Monitoring Suppl (ACCU-CHEK NANO SMARTVIEW) W/DEVICE KIT 1 each by Does not apply route 2 (two) times daily. 09/09/13   Madilyn Fireman, MD  colchicine 0.6 MG tablet Take 1 tablet (0.6 mg total) by mouth daily as needed (for gout flare-ups). 04/08/13   Nena Polio, PA-C  glucose blood (ACCU-CHEK SMARTVIEW) test strip Check blood sugar 4 times a day Dx code- 250.00 09/09/13   Rebecca Eaton, MD  Insulin Syringe-Needle U-100 31G X 15/64"  0.3 ML MISC Check blood sugar 4 times a day Dx code- 250.00 09/09/13   Rebecca Eaton, MD   BP 137/77  Pulse 61  Temp(Src) 97.8 F (36.6 C) (Oral)  Resp 17  Ht _0  (1.575 m)  Wt 185 lb (83.915 kg)  BMI 33.83 kg/m2  SpO2 95% Physical Exam 1025: Physical examination:  Nursing notes reviewed; Vital signs and O2 SAT reviewed;  Constitutional: Well developed, Well nourished, Well hydrated, In no acute distress; Head:  Normocephalic, atraumatic; Eyes: EOMI, PERRL, No scleral icterus; ENMT: Mouth and pharynx normal, Mucous membranes moist; Neck: Supple, Full range of motion, No lymphadenopathy; Cardiovascular: Regular rate and rhythm, No murmur, rub, or gallop; Respiratory: Breath sounds clear & equal bilaterally, No rales, rhonchi, wheezes.  Speaking full sentences with ease, Normal respiratory effort/excursion; Chest: Nontender, Movement normal; Abdomen: Soft, Nontender, Nondistended, Normal bowel sounds; Genitourinary: No CVA tenderness; Extremities: Pulses normal, No tenderness, No edema, No calf edema or asymmetry.; Neuro: AA&Ox3, Major CN grossly intact.  Speech clear. No gross focal motor or sensory deficits in extremities.; Skin: Color normal, Warm, Dry.   ED Course  Procedures     EKG Interpretation None      MDM  MDM Reviewed: previous chart, nursing note and vitals Reviewed previous: labs and ECG Interpretation: labs, ECG and x-ray Total time providing critical care: 30-74 minutes. This excludes time spent performing separately reportable procedures and services. Consults: admitting MD   CRITICAL CARE Performed by: Alfonzo Feller Total critical care time: 80 Critical care time was exclusive of separately billable procedures and treating other patients. Critical care was necessary to treat or prevent imminent or life-threatening deterioration. Critical care was time spent personally by me on the following activities: development of treatment plan with patient and/or  surrogate as well as nursing, discussions with consultants, evaluation of patient's response to treatment, examination of patient, obtaining history from patient or surrogate, ordering and performing treatments and interventions, ordering and review of laboratory studies, ordering and review of radiographic studies, pulse  oximetry and re-evaluation of patient's condition.    Date: 10/01/2013  Rate: 54  Rhythm: normal sinus rhythm  QRS Axis: normal  Intervals: normal  ST/T Wave abnormalities: nonspecific T wave changes  Conduction Disutrbances:none  Narrative Interpretation:   Old EKG Reviewed: unchanged; no significant changes from previous EKG dated 09/03/2013.   Results for orders placed during the hospital encounter of 10/01/13  CBC      Result Value Ref Range   WBC 6.7  4.0 - 10.5 K/uL   RBC 4.36  3.87 - 5.11 MIL/uL   Hemoglobin 12.8  12.0 - 15.0 g/dL   HCT 38.2  36.0 - 46.0 %   MCV 87.6  78.0 - 100.0 fL   MCH 29.4  26.0 - 34.0 pg   MCHC 33.5  30.0 - 36.0 g/dL   RDW 14.6  11.5 - 15.5 %   Platelets 253  150 - 400 K/uL  COMPREHENSIVE METABOLIC PANEL      Result Value Ref Range   Sodium 133 (*) 137 - 147 mEq/L   Potassium 4.6  3.7 - 5.3 mEq/L   Chloride 92 (*) 96 - 112 mEq/L   CO2 26  19 - 32 mEq/L   Glucose, Bld 585 (*) 70 - 99 mg/dL   BUN 15  6 - 23 mg/dL   Creatinine, Ser 1.09  0.50 - 1.10 mg/dL   Calcium 9.2  8.4 - 10.5 mg/dL   Total Protein 7.1  6.0 - 8.3 g/dL   Albumin 3.5  3.5 - 5.2 g/dL   AST 29  0 - 37 U/L   ALT 21  0 - 35 U/L   Alkaline Phosphatase 136 (*) 39 - 117 U/L   Total Bilirubin 0.2 (*) 0.3 - 1.2 mg/dL   GFR calc non Af Amer 56 (*) >90 mL/min   GFR calc Af Amer 65 (*) >90 mL/min  URINALYSIS, ROUTINE W REFLEX MICROSCOPIC      Result Value Ref Range   Color, Urine YELLOW  YELLOW   APPearance CLEAR  CLEAR   Specific Gravity, Urine 1.030  1.005 - 1.030   pH 6.5  5.0 - 8.0   Glucose, UA >1000 (*) NEGATIVE mg/dL   Hgb urine dipstick NEGATIVE  NEGATIVE    Bilirubin Urine NEGATIVE  NEGATIVE   Ketones, ur NEGATIVE  NEGATIVE mg/dL   Protein, ur NEGATIVE  NEGATIVE mg/dL   Urobilinogen, UA 0.2  0.0 - 1.0 mg/dL   Nitrite NEGATIVE  NEGATIVE   Leukocytes, UA SMALL (*) NEGATIVE  LIPASE, BLOOD      Result Value Ref Range   Lipase 31  11 - 59 U/L  TROPONIN I      Result Value Ref Range   Troponin I <0.30  <0.30 ng/mL  URINE RAPID DRUG SCREEN (HOSP PERFORMED)      Result Value Ref Range   Opiates NONE DETECTED  NONE DETECTED   Cocaine NONE DETECTED  NONE DETECTED   Benzodiazepines NONE DETECTED  NONE DETECTED   Amphetamines NONE DETECTED  NONE DETECTED   Tetrahydrocannabinol NONE DETECTED  NONE DETECTED   Barbiturates NONE DETECTED  NONE DETECTED  URINE MICROSCOPIC-ADD ON      Result Value Ref Range   Squamous Epithelial / LPF FEW (*) RARE   WBC, UA 21-50  <3 WBC/hpf   Bacteria, UA FEW (*) RARE  CBG MONITORING, ED      Result Value Ref Range   Glucose-Capillary 590 (*) 70 - 99 mg/dL   Comment 1  Documented in Chart     Comment 2 Notify RN    I-STAT ARTERIAL BLOOD GAS, ED      Result Value Ref Range   pH, Arterial 7.392  7.350 - 7.450   pCO2 arterial 43.4  35.0 - 45.0 mmHg   pO2, Arterial 71.0 (*) 80.0 - 100.0 mmHg   Bicarbonate 26.5 (*) 20.0 - 24.0 mEq/L   TCO2 28  0 - 100 mmol/L   O2 Saturation 94.0     Acid-Base Excess 1.0  0.0 - 2.0 mmol/L   Patient temperature 97.8 F     Collection site RADIAL, ALLEN'S TEST ACCEPTABLE     Drawn by Operator     Sample type ARTERIAL     Dg Chest Portable 1 View 10/01/2013   CLINICAL DATA:  Hyperglycemia.  EXAM: PORTABLE CHEST - 1 VIEW  COMPARISON:  09/02/2013  FINDINGS: The heart size and mediastinal contours are within normal limits. Both lungs are clear. The visualized skeletal structures are unremarkable.  IMPRESSION: Normal exam.   Electronically Signed   By: Rozetta Nunnery M.D.   On: 10/01/2013 11:03    1155:  Pt hyperglycemic, not acidotic. Pt endorses she has been taking her insulin as  prescribed. IVF bolus given, IV insulin gtt started. CP/abd pain improved after meds. +UTI, UC pending; will dose IV rocephin.  Dx and testing d/w pt and family.  Questions answered.  Verb understanding, agreeable to admit.  T/C to [facility] Resident, case discussed, including:  HPI, pertinent PM/SHx, VS/PE, dx testing, ED course and treatment:  Agreeable to admit, requests to write temporary orders, obtain tele bed to Dr. Wilber Bihari service.    Alfonzo Feller, DO 10/03/13 1440

## 2013-10-01 NOTE — ED Notes (Signed)
Dr. McManus at bedside. 

## 2013-10-01 NOTE — ED Notes (Signed)
CBG is 405

## 2013-10-01 NOTE — ED Notes (Signed)
EMS - Pt presents from home with CBG of 552.  Pt given 2 Nitro and 4 Aspirin with EMS.  Pt is also c/o of chest pain 4/10 pain that radiates to the back and shoulders.  Pt is experiencing vision changes, headache and lightheadedness.

## 2013-10-01 NOTE — H&P (Signed)
Date: 10/01/2013               Patient Name:  [name] MRN: [id number]  DOB: 11/03/1955 Age / Sex: 58 y.o., female   PCP: Rebecca Eaton, MD         Medical Service: Internal Medicine Teaching Service         Attending Physician: Dr. Aldine Contes, MD    First Contact: Dr. Duwaine Maxin Pager: [phone]  Second Contact: Dr. Clinton Gallant Pager: [phone]       After Hours (After 5p/  First Contact Pager: [phone]  weekends / holidays): Second Contact Pager: [phone]   Chief Complaint: hyperglycemia   History of Present Illness:  [name] is a 58 year old woman with a PMH of DM type 2, COPD, HTN, depression, hypothyroidism and DDD who presents with c/o hyperglycemia.  She called EMS because her meter read HIGH this morning.  She says she has been feeling "whoozy" for the past few days and her CBGs have been 300-500 the past few weeks.  She reports compliance with Lantus 10 unit qHS and 4 units Novolog TID.  She also reports chest pain that she describes as both pressure and burning.  The pain is central chest and upper abdominal 7/10 and radiates to the back.  She says the pain feels like when she has had pancreatitis in the past.  She says she took Protonix at home which helped the pain.  She says she usually takes oxycodone for pain but has run out.  She also notes a cough and some nausea.  She has not felt feverish.  Of note, she was admitted 04/08-04/09/15 for right sided weakness, atypical chest pain and epigastric pain.  Stroke and ACS work-up were negative.  The chest and epigastric pain were thought to be GI in origin 2/2 to known Schatki's ring.  She was discharged on her home medications including Oxycodone 8m q4h prn and Protonix.  In the ED:  T97.32F, RR 17, SpO2 97%, HR 61, BP 137/740mg; she was given morphine, GI cocktail, ceftriaxone, 2L NS bolus and started on an insulin gtt.  Meds: Current Facility-Administered Medications  Medication Dose Route Frequency Provider Last  Rate Last Dose  . 0.9 %  sodium chloride infusion   Intravenous Continuous KaAlfonzo FellerDO      . 0.9 %  sodium chloride infusion   Intravenous Continuous KaAlfonzo FellerDO      . dextrose 5 %-0.45 % sodium chloride infusion   Intravenous Continuous KaAlfonzo FellerDO      . dextrose 50 % solution 25 mL  25 mL Intravenous PRN KaAlfonzo FellerDO      . insulin regular (NOVOLIN R,HUMULIN R) 1 Units/mL in sodium chloride 0.9 % 100 mL infusion   Intravenous Continuous KaAlfonzo FellerDO      . insulin regular bolus via infusion 0-10 Units  0-10 Units Intravenous TID WC KaAlfonzo FellerDO      . morphine 4 MG/ML injection 4 mg  4 mg Intravenous Q1H PRN KaAlfonzo FellerDO   4 mg at 10/01/13 1224  . nitroGLYCERIN (NITROSTAT) SL tablet 0.4 mg  0.4 mg Sublingual Q5 min PRN KaAlfonzo FellerDO       Current Outpatient Prescriptions  Medication Sig Dispense Refill  . albuterol (PROVENTIL HFA;VENTOLIN HFA) 108 (90 BASE) MCG/ACT inhaler Inhale 2 puffs into the lungs every 6 (six) hours as needed for wheezing.  1 Inhaler  2  . albuterol (PROVENTIL) (2.5 MG/3ML) 0.083% nebulizer solution Inhale 2.5 mg into the lungs every 6 (six) hours as needed for wheezing.      Marland Kitchen allopurinol (ZYLOPRIM) 300 MG tablet Take 300 mg by mouth daily. For gout.      Marland Kitchen ALPRAZolam (XANAX) 0.5 MG tablet Take 0.5 mg by mouth 3 (three) times daily.      Marland Kitchen amLODipine (NORVASC) 5 MG tablet Take 5 mg by mouth daily. For hypertension.      Marland Kitchen aspirin EC 81 MG tablet Take 81 mg by mouth daily.      Marland Kitchen atorvastatin (LIPITOR) 80 MG tablet Take 80 mg by mouth daily.      . Brinzolamide-Brimonidine (SIMBRINZA) 1-0.2 % SUSP Place 1 drop into both eyes 2 (two) times daily.      . divalproex (DEPAKOTE) 500 MG DR tablet Take 1,000 mg by mouth at bedtime.       . gabapentin (NEURONTIN) 100 MG capsule Take 300 mg by mouth 2 (two) times daily.      Marland Kitchen gemfibrozil (LOPID) 600 MG tablet Take 600 mg by mouth daily.         . insulin aspart (NOVOLOG) 100 UNIT/ML injection Inject 4 Units into the skin 3 (three) times daily before meals.      . insulin glargine (LANTUS) 100 UNIT/ML injection Inject 10 Units into the skin at bedtime.      Marland Kitchen isometheptene-acetaminophen-dichloralphenazone (MIDRIN) 65-325-100 MG capsule Take 1 capsule by mouth 4 (four) times daily as needed for migraine. Maximum 5 capsules in 12 hours for migraine headaches, 8 capsules in 24 hours for tension headaches.  30 capsule  1  . latanoprost (XALATAN) 0.005 % ophthalmic solution Place 1 drop into both eyes 2 (two) times daily.      Marland Kitchen levothyroxine (SYNTHROID, LEVOTHROID) 50 MCG tablet Take 1 tablet (50 mcg total) by mouth daily before breakfast.  30 tablet  2  . lisinopril (PRINIVIL,ZESTRIL) 40 MG tablet Take 1 tablet (40 mg total) by mouth daily.  30 tablet  3  . mometasone-formoterol (DULERA) 100-5 MCG/ACT AERO Inhale 2 puffs into the lungs 2 (two) times daily.  1 Inhaler    . nitroGLYCERIN (NITROSTAT) 0.4 MG SL tablet Place 1 tablet (0.4 mg total) under the tongue every 5 (five) minutes as needed for chest pain.  30 tablet  3  . ondansetron (ZOFRAN ODT) 8 MG disintegrating tablet Take 1 tablet (8 mg total) by mouth every 8 (eight) hours as needed for nausea or vomiting.  12 tablet  0  . pantoprazole (PROTONIX) 40 MG tablet Take 1 tablet (40 mg total) by mouth daily. For reflux.  30 tablet  11  . sertraline (ZOLOFT) 100 MG tablet Take 1 tablet (100 mg total) by mouth daily.  30 tablet  0  . topiramate (TOPAMAX) 25 MG tablet Take one tablet at night for one week, then take 2 tablets at night for one week, then take 3 tablets at night.      . traMADol (ULTRAM) 50 MG tablet Take 1 tablet (50 mg total) by mouth every 6 (six) hours as needed.  20 tablet  0  . ACCU-CHEK FASTCLIX LANCETS MISC Check blood sugar 4 times a day Dx code- 250.00  102 each  5  . Blood Glucose Monitoring Suppl (ACCU-CHEK NANO SMARTVIEW) W/DEVICE KIT 1 each by Does not apply route  2 (two) times daily.  1 kit  0  . colchicine 0.6 MG  tablet Take 1 tablet (0.6 mg total) by mouth daily as needed (for gout flare-ups).      Marland Kitchen glucose blood (ACCU-CHEK SMARTVIEW) test strip Check blood sugar 4 times a day Dx code- 250.00  150 each  6  . Insulin Syringe-Needle U-100 31G X 15/64" 0.3 ML MISC Check blood sugar 4 times a day Dx code- 250.00  200 each  5    Allergies: Allergies as of 10/01/2013 - Review Complete 10/01/2013  Allergen Reaction Noted  . Acetaminophen Nausea And Vomiting 09/02/2013  . Hydrocodone-acetaminophen Hives 01/02/2013  . Metformin Diarrhea 01/02/2013   Past Medical History  Diagnosis Date  . Asthma     past 10 years  . Diabetes mellitus     past 5 years  . Glaucoma     dx 06/2011-[facility]  . Hypertension   . Hyperlipidemia   . Hyperthyroidism   . History of TIA (transient ischemic attack)     01/06/2011, x2  . Pancreatitis   . Gout   . Depression   . Renal disorder     chronic kidney disease, stage 3  . Chronic pain   . Glaucoma   . Arthritis     djd -lower back, left knee  . Degenerative disc disease   . Bipolar disorder   . Gastritis   . COPD (chronic obstructive pulmonary disease)   . Schizophrenia   . 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 Fatty liver     mild CT 06/2012  . DKA (diabetic ketoacidoses) 07/21/2013  . Stroke     TIAs  . Esophageal ring, acquired 08/12/2013  . Cocaine abuse   . Chronic abdominal pain    Past Surgical History  Procedure Laterality Date  . Abdominal hysterectomy  1990    still has ovaries   . Colonoscopy    . Refractive surgery      both eyes   Family History  Problem Relation Age of Onset  . Arthritis Mother   . Diabetes Mother   . Hypertension Mother   . Kidney disease Mother   . Heart disease Mother     died 71  . Heart disease      mother & father  . Hypertension      mother & father  . Colon cancer Neg Hx   . Prostate cancer Neg Hx   . Breast cancer Neg Hx    . Stomach cancer Neg Hx   . Esophageal cancer Neg Hx   . Migraines Neg Hx   . Hypertension Sister   . Hypertension Brother   . Kidney disease Brother    History   Social History  . Marital Status: Single    Spouse Name: N/A    Number of Children: 3  . Years of Education: college   Occupational History  . Disability    Social History Main Topics  . Smoking status: Current Every Day Smoker -- 0.02 packs/day for 30 years    Types: Cigarettes  . Smokeless tobacco: Never Used     Comment: smokes 2 -3 cigs daily/uses patches.  . Alcohol Use: No     Comment: denied alcohol at this time  . Drug Use: Yes    Special: Cocaine     Comment: denies.  . Sexual Activity: Not on file     Comment: hysterectomy   Other Topics Concern  . Not on file   Social History Narrative   On disability   Single 3 sons  3 coffee/day   3 regular pepsi/day   Updated 08/11/2013                   Review of Systems: Pertinent items are noted in HPI.  Physical Exam: Blood pressure 102/64, pulse 66, temperature 97.8 F (36.6 C), temperature source Oral, resp. rate 20, height 5' 2"  (1.575 m), weight 83.915 kg (185 lb), SpO2 95.00%. General: resting in bed in NAD HEENT: PERRL, EOMI, oropharynx clear Cardiac: RRR, no rubs, murmurs or gallops Pulm: clear to auscultation bilaterally, moving normal volumes of air Abd: soft, nondistended, BS present, minimal diffuse tenderness, + suprapubic tenderness Ext: warm and well perfused, no pedal edema Neuro: alert and oriented X3, cranial nerves II-XII grossly intact, strength equal and intact  Lab results: Basic Metabolic Panel:  Recent Labs  10/01/13 1030  NA 133*  K 4.6  CL 92*  CO2 26  GLUCOSE 585*  BUN 15  CREATININE 1.09  CALCIUM 9.2   Liver Function Tests:  Recent Labs  10/01/13 1030  AST 29  ALT 21  ALKPHOS 136*  BILITOT 0.2*  PROT 7.1  ALBUMIN 3.5    Recent Labs  10/01/13 1030  LIPASE 31   CBC:  Recent Labs   10/01/13 1030  WBC 6.7  HGB 12.8  HCT 38.2  MCV 87.6  PLT 253   Cardiac Enzymes:  Recent Labs  10/01/13 1030  TROPONINI <0.30   CBG:  Recent Labs  10/01/13 1019 10/01/13 1213 10/01/13 1318 10/01/13 1441 10/01/13 1732  GLUCAP 590* 463* 465* 405* 320*   Urine Drug Screen: Drugs of Abuse     Component Value Date/Time   LABOPIA NONE DETECTED 10/01/2013 1043   LABOPIA NEG 01/23/2013 1507   COCAINSCRNUR NONE DETECTED 10/01/2013 1043   COCAINSCRNUR NEG 01/23/2013 1507   LABBENZ NONE DETECTED 10/01/2013 1043   LABBENZ NEG 01/23/2013 1507   AMPHETMU NONE DETECTED 10/01/2013 1043   THCU NONE DETECTED 10/01/2013 1043   LABBARB NONE DETECTED 10/01/2013 1043   LABBARB NEG 01/23/2013 1507    Urinalysis:  Recent Labs  10/01/13 1043  COLORURINE YELLOW  LABSPEC 1.030  PHURINE 6.5  GLUCOSEU >1000*  HGBUR NEGATIVE  BILIRUBINUR NEGATIVE  KETONESUR NEGATIVE  PROTEINUR NEGATIVE  UROBILINOGEN 0.2  NITRITE NEGATIVE  LEUKOCYTESUR SMALL*   Imaging results:  Dg Chest Portable 1 View  10/01/2013   CLINICAL DATA:  Hyperglycemia.  EXAM: PORTABLE CHEST - 1 VIEW  COMPARISON:  09/02/2013  FINDINGS: The heart size and mediastinal contours are within normal limits. Both lungs are clear. The visualized skeletal structures are unremarkable.  IMPRESSION: Normal exam.   Electronically Signed   By: Rozetta Nunnery M.D.   On: 10/01/2013 11:03   Assessment & Plan by Problem: 58 year old woman with a PMH of DM type 2, COPD, HTN, depression, hypothyroidism and DDD who presents with c/o hyperglycemia.  Hyperglycemia in the setting of DM type 2:  No AG or ketones to suggest DKA.  Hgb A1c 15.5 (Apr 2015).  She was previously on higher dose of insulin (25 units daily) but Lantus decreased at last admission 2/2 to [facility] in hospital with less Lantus.  No s&s of infection.  She reports compliance with current insulin regimen:  10 units Lantus qHS and 4 units TID. - stop insulin gtt - Lantus 10 units qHS and  SSI - CBG monitoring - NS at 125 cc/hr  Atypical chest pain/abdominal pain:  She describes retrosternal burning c/w reflux and has known Schatzki  ring.  Her pain responded to Protonix, but reportedly did not respond to ASA and SL NTG given by EMS.  Troponin negative.   - EKG - trend CE - GI cocktail - continue Protonix, ASA - morphine prn - Zofran prn  UTI:  Nitrite neg, Leuk pos, WBC 21-50; + suprapubic tenderness, asymptomatic - continue ceftriaxone for now - send urine culture  HTN:  Continue antihypertensives  Hypothyroidism:  Continue Synthroid  Depression:  Continue Zoloft  VTE ppx:  Heparin [place] Diet:  Heart healthy/carb mod Code:  Full  Dispo: Disposition is deferred at this time, awaiting improvement of current medical problems. Anticipated discharge in approximately 1-2 day(s).   The patient does have a current PCP Rebecca Eaton, MD) and does need an [facility] hospital follow-up appointment after discharge.  The patient does not know have transportation limitations that hinder transportation to clinic appointments.  Signed: Duwaine Maxin, DO 10/01/2013, 1:08 PM

## 2013-10-01 NOTE — ED Notes (Signed)
Pharmacy notified for missing Lantus dose.

## 2013-10-02 DIAGNOSIS — N390 Urinary tract infection, site not specified: Secondary | ICD-10-CM

## 2013-10-02 DIAGNOSIS — E1149 Type 2 diabetes mellitus with other diabetic neurological complication: Secondary | ICD-10-CM

## 2013-10-02 DIAGNOSIS — R109 Unspecified abdominal pain: Secondary | ICD-10-CM

## 2013-10-02 DIAGNOSIS — R079 Chest pain, unspecified: Secondary | ICD-10-CM

## 2013-10-02 LAB — CBC
HEMATOCRIT: 37 % (ref 36.0–46.0)
HEMOGLOBIN: 12.1 g/dL (ref 12.0–15.0)
MCH: 29.2 pg (ref 26.0–34.0)
MCHC: 32.7 g/dL (ref 30.0–36.0)
MCV: 89.4 fL (ref 78.0–100.0)
Platelets: 230 10*3/uL (ref 150–400)
RBC: 4.14 MIL/uL (ref 3.87–5.11)
RDW: 15.1 % (ref 11.5–15.5)
WBC: 7.3 10*3/uL (ref 4.0–10.5)

## 2013-10-02 LAB — BASIC METABOLIC PANEL
BUN: 12 mg/dL (ref 6–23)
CHLORIDE: 100 meq/L (ref 96–112)
CO2: 24 mEq/L (ref 19–32)
Calcium: 8.4 mg/dL (ref 8.4–10.5)
Creatinine, Ser: 1 mg/dL (ref 0.50–1.10)
GFR, EST AFRICAN AMERICAN: 72 mL/min — AB (ref >90)
GFR, EST NON AFRICAN AMERICAN: 62 mL/min — AB (ref >90)
Glucose, Bld: 320 mg/dL — ABNORMAL HIGH (ref 70–99)
POTASSIUM: 4.5 meq/L (ref 3.7–5.3)
Sodium: 139 mEq/L (ref 137–147)

## 2013-10-02 LAB — GLUCOSE, CAPILLARY
GLUCOSE-CAPILLARY: 248 mg/dL — AB (ref 70–99)
Glucose-Capillary: 273 mg/dL — ABNORMAL HIGH (ref 70–99)
Glucose-Capillary: 329 mg/dL — ABNORMAL HIGH (ref 70–99)

## 2013-10-02 MED ORDER — LEVOFLOXACIN 750 MG PO TABS: 750.0000 mg | ORAL_TABLET | Freq: Every day | ORAL | Status: AC

## 2013-10-02 MED ORDER — ACCU-CHEK FASTCLIX LANCETS MISC: Status: DC

## 2013-10-02 MED ORDER — INSULIN GLARGINE 100 UNIT/ML ~~LOC~~ SOLN: 25.0000 [IU] | Freq: Every day | SUBCUTANEOUS | Status: DC

## 2013-10-02 MED ORDER — KETOROLAC TROMETHAMINE 30 MG/ML IJ SOLN
30.0000 mg | Freq: Four times a day (QID) | INTRAMUSCULAR | Status: DC | PRN
  Administered 2013-10-02: 30 mg via INTRAVENOUS
  Filled 2013-10-02: qty 1

## 2013-10-02 MED ORDER — GLUCOSE BLOOD VI STRP: ORAL_STRIP | Status: DC

## 2013-10-02 MED ORDER — LEVOFLOXACIN 750 MG PO TABS
750.0000 mg | ORAL_TABLET | Freq: Every day | ORAL | Status: DC
  Filled 2013-10-02: qty 1

## 2013-10-02 MED ORDER — KETOROLAC TROMETHAMINE 30 MG/ML IJ SOLN
30.0000 mg | Freq: Once | INTRAMUSCULAR | Status: AC
  Administered 2013-10-02: 30 mg via INTRAVENOUS
  Filled 2013-10-02: qty 1

## 2013-10-02 MED ORDER — CLOTRIMAZOLE 1 % VA CREA: 1.0000 | TOPICAL_CREAM | Freq: Every day | VAGINAL | Status: DC

## 2013-10-02 NOTE — Progress Notes (Signed)
Inpatient Diabetes Program Recommendations  AACE/ADA: New Consensus Statement on Inpatient Glycemic Control (2013)  Target Ranges:  Prepandial:   less than 140 mg/dL      Peak postprandial:   less than 180 mg/dL (1-2 hours)      Critically ill patients:  140 - 180 mg/dL   Reason for Visit: Hyperglycemia  Results for [name], [name] (MRN [id number]) as of 10/02/2013 15:23  Ref. Range 10/01/2013 14:41 10/01/2013 17:32 10/01/2013 21:38 10/02/2013 07:46 10/02/2013 11:40  Glucose-Capillary Latest Range: 70-99 mg/dL 405 (H) 320 (H) 291 (H) 273 (H) 248 (H)   Needs more Lantus and Novolog. To have bariatric surgery on May 19th at [facility].  Inpatient Diabetes Program Recommendations Insulin - Basal: Increase Lantus to 20 units QHS Correction (SSI): Add HS correction Insulin - Meal Coverage: Add Novolog 6 units tidwc for meal coverage insulin if pt eats >50% meal HgbA1C: 15.5%  Will need prescription for glucose monitoring strips and lancets at discharge.   Note: Will continue to follow while inpatient. Thank you. Lorenda Peck, RD, LDN, CDE Inpatient Diabetes Coordinator [phone]

## 2013-10-02 NOTE — Discharge Summary (Signed)
Patient Name:  [name]  MRN: [id number]  PCP: Rebecca Eaton, MD  DOB:  10/03/1955       Date of Admission:  10/01/2013  Date of Discharge:  10/02/2013      Attending Physician: Dr. Aldine Contes, MD         DISCHARGE DIAGNOSES: 1.   Severe hyperglycemia without ketosis 2.   Type II diabetes mellitus with neurological manifestations, uncontrolled 3.    Atypical chest pain 4.    Abdominal pain 5.    UTI, complicated   DISPOSITION AND FOLLOW-UP: [name] is to follow-up with the listed providers as detailed below, at patient's visiting, please address following issues:  1) Check CBG/review blood glucose logs and adjust insulin accordingly. 2) Resolution of chest and epigastric pain.  Follow-up Information   Follow up with Jenetta Downer, MD On 10/05/2013. (for hospital follow-up)    Specialty:  Internal Medicine   Contact information:   [place] [address] [phone]       Follow up with Jenkins Rouge, MD On 10/06/2013. (for hospital follow-up)    Specialty:  Cardiology   Contact information:   [address] [place] [address] [phone]      Discharge Orders   Future Appointments Provider Department Dept Phone   10/05/2013 2:15 PM Jenetta Downer, MD [place] [phone]   10/06/2013 2:45 PM Josue Hector, MD [place] Office [phone]   10/12/2013 1:30 PM Resa Miner, [place] [facility] [place] [phone]   10/12/2013 3:45 PM Rebecca Eaton, MD [place] [phone]   01/22/2014 9:00 AM Ward Givens, NP [facility] [phone]   Future Orders Complete By Expires   Call MD for:  difficulty breathing, headache or visual disturbances  As directed    Call MD for:  extreme fatigue  As directed    Call MD for:  persistant dizziness or light-headedness  As directed    Call MD for:  persistant nausea and  vomiting  As directed    Call MD for:  severe uncontrolled pain  As directed    Call MD for:  temperature >100.4  As directed    Diet - low sodium heart healthy  As directed    Increase activity slowly  As directed        DISCHARGE MEDICATIONS:   Medication List         ACCU-CHEK FASTCLIX LANCETS Misc  Check blood sugar 4 times a day Dx code- 250.00     ACCU-CHEK NANO SMARTVIEW W/DEVICE Kit  1 each by Does not apply route 2 (two) times daily.     albuterol 108 (90 BASE) MCG/ACT inhaler  Commonly known as:  PROVENTIL HFA;VENTOLIN HFA  Inhale 2 puffs into the lungs every 6 (six) hours as needed for wheezing.     albuterol (2.5 MG/3ML) 0.083% nebulizer solution  Commonly known as:  PROVENTIL  Inhale 2.5 mg into the lungs every 6 (six) hours as needed for wheezing.     allopurinol 300 MG tablet  Commonly known as:  ZYLOPRIM  Take 300 mg by mouth daily. For gout.     ALPRAZolam 0.5 MG tablet  Commonly known as:  XANAX  Take 0.5 mg by mouth 3 (three) times daily.     amLODipine 5 MG tablet  Commonly known as:  NORVASC  Take 5 mg by mouth daily. For hypertension.  aspirin EC 81 MG tablet  Take 81 mg by mouth daily.     atorvastatin 80 MG tablet  Commonly known as:  LIPITOR  Take 80 mg by mouth daily.     clotrimazole 1 % vaginal cream  Commonly known as:  GYNE-LOTRIMIN  Place 1 Applicatorful vaginally at bedtime. Use for 7 days.     colchicine 0.6 MG tablet  Take 1 tablet (0.6 mg total) by mouth daily as needed (for gout flare-ups).     divalproex 500 MG DR tablet  Commonly known as:  DEPAKOTE  Take 1,000 mg by mouth at bedtime.     gabapentin 100 MG capsule  Commonly known as:  NEURONTIN  Take 300 mg by mouth 2 (two) times daily.     gemfibrozil 600 MG tablet  Commonly known as:  LOPID  Take 600 mg by mouth daily.     glucose blood test strip  Commonly known as:  ACCU-CHEK SMARTVIEW  Check blood sugar 4 times a day Dx code- 250.00     insulin aspart  100 UNIT/ML injection  Commonly known as:  novoLOG  Inject 4 Units into the skin 3 (three) times daily before meals.     insulin glargine 100 UNIT/ML injection  Commonly known as:  LANTUS  Inject 0.25 mLs (25 Units total) into the skin at bedtime.     Insulin Syringe-Needle U-100 31G X 15/64" 0.3 ML Misc  Check blood sugar 4 times a day Dx code- 250.00     isometheptene-acetaminophen-dichloralphenazone 65-325-100 MG capsule  Commonly known as:  MIDRIN  Take 1 capsule by mouth 4 (four) times daily as needed for migraine. Maximum 5 capsules in 12 hours for migraine headaches, 8 capsules in 24 hours for tension headaches.     latanoprost 0.005 % ophthalmic solution  Commonly known as:  XALATAN  Place 1 drop into both eyes 2 (two) times daily.     levothyroxine 50 MCG tablet  Commonly known as:  SYNTHROID, LEVOTHROID  Take 1 tablet (50 mcg total) by mouth daily before breakfast.     lisinopril 40 MG tablet  Commonly known as:  PRINIVIL,ZESTRIL  Take 1 tablet (40 mg total) by mouth daily.     mometasone-formoterol 100-5 MCG/ACT Aero  Commonly known as:  DULERA  Inhale 2 puffs into the lungs 2 (two) times daily.     nitroGLYCERIN 0.4 MG SL tablet  Commonly known as:  NITROSTAT  Place 1 tablet (0.4 mg total) under the tongue every 5 (five) minutes as needed for chest pain.     ondansetron 8 MG disintegrating tablet  Commonly known as:  ZOFRAN ODT  Take 1 tablet (8 mg total) by mouth every 8 (eight) hours as needed for nausea or vomiting.     pantoprazole 40 MG tablet  Commonly known as:  PROTONIX  Take 1 tablet (40 mg total) by mouth daily. For reflux.     sertraline 100 MG tablet  Commonly known as:  ZOLOFT  Take 1 tablet (100 mg total) by mouth daily.     SIMBRINZA 1-0.2 % Susp  Generic drug:  Brinzolamide-Brimonidine  Place 1 drop into both eyes 2 (two) times daily.     topiramate 25 MG tablet  Commonly known as:  TOPAMAX  Take one tablet at night for one week, then  take 2 tablets at night for one week, then take 3 tablets at night.     traMADol 50 MG tablet  Commonly known as:  Veatrice Bourbon  Take 1 tablet (50 mg total) by mouth every 6 (six) hours as needed.       Levaquin 726m x 4 days  CONSULTS:    None   PROCEDURES PERFORMED:  Dg Chest Portable 1 View  10/01/2013   CLINICAL DATA:  Hyperglycemia.  EXAM: PORTABLE CHEST - 1 VIEW  COMPARISON:  09/02/2013  FINDINGS: The heart size and mediastinal contours are within normal limits. Both lungs are clear. The visualized skeletal structures are unremarkable.  IMPRESSION: Normal exam.   Electronically Signed   By: JRozetta NunneryM.D.   On: 10/01/2013 11:03     ADMISSION DATA: HPI Ms. PEllithorpeis a 58year old woman with a PMH of DM type 2, COPD, HTN, depression, hypothyroidism and DDD who presents with c/o hyperglycemia. She called EMS because her meter read HIGH this morning. She says she has been feeling "whoozy" for the past few days and her CBGs have been 300-500 the past few weeks. She reports compliance with Lantus 10 unit qHS and 4 units Novolog TID. She also reports chest pain that she describes as both pressure and burning. The pain is central chest and upper abdominal 7/10 and radiates to the back. She says the pain feels like when she has had pancreatitis in the past. She says she took Protonix at home which helped the pain. She says she usually takes oxycodone for pain but has run out. She also notes a cough and some nausea. She has not felt feverish.  Of note, she was admitted 04/08-04/09/15 for right sided weakness, atypical chest pain and epigastric pain. Stroke and ACS work-up were negative. The chest and epigastric pain were thought to be GI in origin 2/2 to known Schatki's ring. She was discharged on her home medications including Oxycodone 125mq4h prn and Protonix.  In the ED: T97.80F, RR 17, SpO2 97%, HR 61, BP 137/772m; she was given morphine, GI cocktail, ceftriaxone, 2L NS bolus and started on an  insulin gtt.  Physical Exam: Blood pressure 102/64, pulse 66, temperature 97.8 F (36.6 C), temperature source Oral, resp. rate 20, height _0  (1.575 m), weight 83.915 kg (185 lb), SpO2 95.00%.  General: resting in bed in NAD  HEENT: PERRL, EOMI, oropharynx clear  Cardiac: RRR, no rubs, murmurs or gallops  Pulm: clear to auscultation bilaterally, moving normal volumes of air  Abd: soft, nondistended, BS present, minimal diffuse tenderness, + suprapubic tenderness  Ext: warm and well perfused, no pedal edema  Neuro: alert and oriented X3, cranial nerves II-XII grossly intact, strength equal and intact  Labs: Basic Metabolic Panel:   Recent Labs   10/01/13 1030   NA  133*   K  4.6   CL  92*   CO2  26   GLUCOSE  585*   BUN  15   CREATININE  1.09   CALCIUM  9.2    Liver Function Tests:   Recent Labs   10/01/13 1030   AST  29   ALT  21   ALKPHOS  136*   BILITOT  0.2*   PROT  7.1   ALBUMIN  3.5     Recent Labs   10/01/13 1030   LIPASE  31    CBC:   Recent Labs   10/01/13 1030   WBC  6.7   HGB  12.8   HCT  38.2   MCV  87.6   PLT  253    Cardiac Enzymes:   Recent Labs  10/01/13 1030   TROPONINI  <0.30    CBG:   Recent Labs   10/01/13 1019  10/01/13 1213  10/01/13 1318  10/01/13 1441  10/01/13 1732   GLUCAP  590*  463*  465*  405*  320*    Urine Drug Screen:  Drugs of Abuse    Component  Value  Date/Time    LABOPIA  NONE DETECTED  10/01/2013 1043    LABOPIA  NEG  01/23/2013 1507    COCAINSCRNUR  NONE DETECTED  10/01/2013 1043    COCAINSCRNUR  NEG  01/23/2013 1507    LABBENZ  NONE DETECTED  10/01/2013 1043    LABBENZ  NEG  01/23/2013 1507    AMPHETMU  NONE DETECTED  10/01/2013 1043    THCU  NONE DETECTED  10/01/2013 1043    LABBARB  NONE DETECTED  10/01/2013 1043    LABBARB  NEG  01/23/2013 1507    Urinalysis:   Recent Labs   10/01/13 1043   COLORURINE  YELLOW   LABSPEC  1.030   PHURINE  6.5   GLUCOSEU  >1000*   HGBUR  NEGATIVE     BILIRUBINUR  NEGATIVE   KETONESUR  NEGATIVE   PROTEINUR  NEGATIVE   UROBILINOGEN  0.2   NITRITE  NEGATIVE   LEUKOCYTESUR  SMALL*     HOSPITAL COURSE: Severe hyperglycemia in the setting of DM type 2:  The patient had no AG or ketones to suggest DKA and no signs or symptoms of infection. Her hemoglobin A1c was 15.5 last month.  She was previously on higher dose of insulin (25 units daily) but Lantus decreased at last admission because CBGs better controlled in hospital with less Lantus and she had history of questionable compliance so difficult to titrate her insulin.  She reported compliance with current insulin regimen: 10 units Lantus qHS and 4 units Novolog TID.  The patient's elevated glucose was managed with IV fluids and her home Lantus dose plus sliding scale insulin.  Blood glucose improved to 200s with Lantus 10 units and SSI (she required a total of 33 units of SSI between 6PM on day of admission and 1PM the following day).  Because her BG was still elevated and she required significant SSI she was discharged on increased dose, 25 units Lantus qHS.  No change was made to Novolog which was resumed at discharge at 4 units TID.  She was advised to check her CBG 3-4 times per day for the next few days and take the recordings to her hospital follow-up on 10/05/13.  Her insulin can then be adjusted.  She was discharged in stable condition with hospital follow-up in [place] on 10/05/13 at 2:15PM.      Atypical chest pain/abdominal pain:  She describes retrosternal burning consistent with reflux and has known Schatzki ring.  Her pain responded to Protonix at home, further supporting GI etiology.  She also exhibited chest tenderness and pain responded to Toradol so there may be a musculoskeletal component.  Her cardiac enzymes were negative, EKG unchanged and recent ACS work-up last month was also negative.  Her pain was managed with morphine, Toradol and GI cocktail.  Protonix and ASA were  continued during admission and at discharge.  The patient has a previously scheduled cardiology appointment on 10/06/13.  UTI, complicated in the setting of uncontrolled DM type 2: The patient was treated with ceftriaxone and then discharged on Levaquin for four more days.   DISCHARGE DATA: Vital Signs: BP  131/77  Pulse 67  Temp(Src) 97.8 F (36.6 C) (Oral)  Resp 18  Ht _0  (1.575 m)  Wt 83.915 kg (185 lb)  BMI 33.83 kg/m2  SpO2 91%  Labs: Results for orders placed during the hospital encounter of 10/01/13 (from the past 24 hour(s))  GLUCOSE, CAPILLARY     Status: Abnormal   Collection Time    10/01/13  5:32 PM      Result Value Ref Range   Glucose-Capillary 320 (*) 70 - 99 mg/dL  CBC     Status: None   Collection Time    10/01/13  5:43 PM      Result Value Ref Range   WBC 6.5  4.0 - 10.5 K/uL   RBC 4.29  3.87 - 5.11 MIL/uL   Hemoglobin 12.5  12.0 - 15.0 g/dL   HCT 37.5  36.0 - 46.0 %   MCV 87.4  78.0 - 100.0 fL   MCH 29.1  26.0 - 34.0 pg   MCHC 33.3  30.0 - 36.0 g/dL   RDW 14.7  11.5 - 15.5 %   Platelets 241  150 - 400 K/uL  CREATININE, SERUM     Status: Abnormal   Collection Time    10/01/13  5:43 PM      Result Value Ref Range   Creatinine, Ser 0.94  0.50 - 1.10 mg/dL   GFR calc non Af Amer 67 (*) >90 mL/min   GFR calc Af Amer 78 (*) >90 mL/min  TROPONIN I     Status: None   Collection Time    10/01/13  5:43 PM      Result Value Ref Range   Troponin I <0.30  <0.30 ng/mL  TROPONIN I     Status: None   Collection Time    10/01/13  9:35 PM      Result Value Ref Range   Troponin I <0.30  <0.30 ng/mL  GLUCOSE, CAPILLARY     Status: Abnormal   Collection Time    10/01/13  9:38 PM      Result Value Ref Range   Glucose-Capillary 291 (*) 70 - 99 mg/dL  BASIC METABOLIC PANEL     Status: Abnormal   Collection Time    10/02/13  7:08 AM      Result Value Ref Range   Sodium 139  137 - 147 mEq/L   Potassium 4.5  3.7 - 5.3 mEq/L   Chloride 100  96 - 112 mEq/L    CO2 24  19 - 32 mEq/L   Glucose, Bld 320 (*) 70 - 99 mg/dL   BUN 12  6 - 23 mg/dL   Creatinine, Ser 1.00  0.50 - 1.10 mg/dL   Calcium 8.4  8.4 - 10.5 mg/dL   GFR calc non Af Amer 62 (*) >90 mL/min   GFR calc Af Amer 72 (*) >90 mL/min  CBC     Status: None   Collection Time    10/02/13  7:08 AM      Result Value Ref Range   WBC 7.3  4.0 - 10.5 K/uL   RBC 4.14  3.87 - 5.11 MIL/uL   Hemoglobin 12.1  12.0 - 15.0 g/dL   HCT 37.0  36.0 - 46.0 %   MCV 89.4  78.0 - 100.0 fL   MCH 29.2  26.0 - 34.0 pg   MCHC 32.7  30.0 - 36.0 g/dL   RDW 15.1  11.5 - 15.5 %   Platelets 230  150 - 400 K/uL  GLUCOSE, CAPILLARY     Status: Abnormal   Collection Time    10/02/13  7:46 AM      Result Value Ref Range   Glucose-Capillary 273 (*) 70 - 99 mg/dL  GLUCOSE, CAPILLARY     Status: Abnormal   Collection Time    10/02/13 11:40 AM      Result Value Ref Range   Glucose-Capillary 248 (*) 70 - 99 mg/dL     Services Ordered on Discharge: Y = Yes; Blank = No PT:   OT:   RN:   Equipment:   Other:      Time Spent on Discharge: 35 min   Signed: Duwaine Maxin PGY 1, Internal Medicine Resident 10/02/2013, 4:29 PM

## 2013-10-02 NOTE — H&P (Signed)
Patient sen and examined. Patient is a 58 year old woman with PMH of DM (poorly controlled), COPD, HTN,  Hypothyroidism who was recently admitted to [facility] on 4/8 for atypical chest pain (and had a normal cardiac work up) thought to be due to a Schatzki's ring presents with high glucose readings from home. Patient states she has been feeling lightheaded over the last few days and noted high BS readings on her meter. Yesterday the meter did not give her a number but read "high" so she called EMS and came to ED. Of note she also complained of recurrent CP- mid sternal, non radiating, 5/10. She says that pain was burning and improved a little with protonix. She used to take oxycontin at home but has run out. No SOB, no syncope, no abd pain, no diarrhea, no HA, no nausea or vomiting. REmaining ROS negative  Cardiovascular- regular rate and rhythm, normal heart sounds Lungs- CTA bilaterally Abd- soft, NT, ND, bowel sounds + Extremities- No LE edema Gen- AAO*3  Severe hyperglycemia - CBG improved today. Would d/c home on 25 units of Lantus and novolog 4 units tid before meals - Patient to monitor CBG over weekend and come to outpatient clinic on Monday for follow up - I suspect she will need premeal increased as well  Chest pain - Troponins negative. Recent ACS work up negative as well - Suspect it is GI in etiology - Continue with protonix. May need to increase to twice a day  HTN - continue with home meds  Hypothyroidism - continue with synthroid  Dispo - stable for d/chome today. Outpatient follow up in clinic on Monday

## 2013-10-02 NOTE — Discharge Instructions (Signed)
1. Please go to your hospital follow-up appointments.       2. Please take all medications as prescribed.    3. If you have worsening of your symptoms or new symptoms arise, please call the clinic ([phone]), or go to the ER immediately if symptoms are severe.

## 2013-10-02 NOTE — Progress Notes (Signed)
NURSING PROGRESS NOTE  SANAM MARMO [id number] Discharge Data: 10/02/2013 5:12 PM Attending Provider: Aldine Contes, MD [id number], Apolonio Schneiders, MD   Forestine Chute to be D/C'd Home per MD order.    All IV's will be discontinued and monitored for bleeding.  All belongings will be returned to patient for patient to take home.  Last Documented Vital Signs:  Blood pressure 131/77, pulse 67, temperature 97.8 F (36.6 C), temperature source Oral, resp. rate 18, height 5\' 2"  (1.575 m), weight 83.915 kg (185 lb), SpO2 91.00%.  Joslyn Hy, MSN, RN, [employer]

## 2013-10-02 NOTE — Progress Notes (Signed)
UR completed 

## 2013-10-02 NOTE — Progress Notes (Signed)
Subjective: No acute overnight events.  She was seen and examined this AM.  She still has some epigastric/chest discomfort.  No dyspnea.  Appetite good.  Toradol has worked better for her pain than morphine.  Objective: Vital signs in last 24 hours: Filed Vitals:   10/01/13 1557 10/01/13 2139 10/02/13 0558 10/02/13 0645  BP: 156/89 131/79 135/55 144/84  Pulse: 66 69 65 64  Temp: 98.2 F (36.8 C) 98.2 F (36.8 C) 98 F (36.7 C)   TempSrc: Oral Oral Oral   Resp: 18 18 18    Height:      Weight:      SpO2: 93% 94% 92%    Weight change:   Intake/Output Summary (Last 24 hours) at 10/02/13 1102 Last data filed at 10/01/13 2300  Gross per 24 hour  Intake      0 ml  Output    600 ml  Net   -600 ml   General: resting in bed in NAD HEENT: no gross abnormality Cardiac: RRR, no rubs, murmurs or gallops, central chest TTP Pulm: clear to auscultation bilaterally, moving normal volumes of air Abd: soft, nondistended, BS present, minimal epigastric tenderness, + suprapubic tenderness Ext: warm and well perfused, no pedal edema Neuro: alert and oriented X3, able to move all extremities  Lab Results: Basic Metabolic Panel:  Recent Labs Lab 10/01/13 1030 10/01/13 1743 10/02/13 0708  NA 133*  --  139  K 4.6  --  4.5  CL 92*  --  100  CO2 26  --  24  GLUCOSE 585*  --  320*  BUN 15  --  12  CREATININE 1.09 0.94 1.00  CALCIUM 9.2  --  8.4   CBC:  Recent Labs Lab 10/01/13 1743 10/02/13 0708  WBC 6.5 7.3  HGB 12.5 12.1  HCT 37.5 37.0  MCV 87.4 89.4  PLT 241 230   CBG:  Recent Labs Lab 10/01/13 1213 10/01/13 1318 10/01/13 1441 10/01/13 1732 10/01/13 2138 10/02/13 0746  GLUCAP 463* 465* 405* 320* 291* 273*   Medications: I have reviewed the patient's current medications. Scheduled Meds: . allopurinol  300 mg Oral Daily  . amLODipine  5 mg Oral Daily  . aspirin EC  81 mg Oral Daily  . atorvastatin  80 mg Oral Daily  . clotrimazole  1 Applicatorful Vaginal QHS   . divalproex  1,000 mg Oral QHS  . gabapentin  300 mg Oral BID  . gemfibrozil  600 mg Oral Daily  . heparin  5,000 Units Subcutaneous 3 times per day  . insulin aspart  0-20 Units Subcutaneous TID WC  . insulin glargine  10 Units Subcutaneous QHS  . latanoprost  1 drop Both Eyes BID  . levothyroxine  50 mcg Oral QAC breakfast  . lisinopril  40 mg Oral Daily  . mometasone-formoterol  2 puff Inhalation BID  . pantoprazole  40 mg Oral Daily  . sertraline  100 mg Oral Daily  . sodium chloride  3 mL Intravenous Q12H   Continuous Infusions: . sodium chloride 125 mL/hr at 10/02/13 1032   PRN Meds:.albuterol, gi cocktail, ketorolac, morphine injection, ondansetron (ZOFRAN) IV, ondansetron  Assessment/Plan: 58 year old woman with a PMH of DM type 2, COPD, HTN, depression, hypothyroidism and DDD who presents with c/o hyperglycemia.   Hyperglycemia in the setting of DM type 2:  No AG or ketones to suggest DKA. Hgb A1c 15.5 (Apr 2015). She was previously on higher dose of insulin (25 units daily) but  Lantus decreased at last admission 2/2 to CBGs controlled in hospital with less Lantus. No s&s of infection. She reports compliance with current insulin regimen: 10 units Lantus qHS and 4 units TID.   She received 20 units total Lantus yesterday and CBGs still elevated high 200s this AM, 320 on BMP.  She required 11 units of Novolog with breakfast and 7 units with lunch. - she is stable for d/c home on increased dose of Lantus - 25 units qHS; continue 4 units Novolog TID - she has been advised to check her CBG frequently (3-4 x daily) for the next few days so she can bring her CBG log to clinic and insulin dose can be adjusted accordingly. - hospital f/u in [facility] on 10/05/13 at 2:15PM  Atypical chest pain/abdominal pain: She describes retrosternal burning c/w reflux and has known Schatzki ring. Her pain responded to Protonix further supporting GI etiology.  May be musculoskeletal component since Ketorolac  helped and her chest is TTP.  CE neg x 3, no EKG changes.  - she is stable for d/c home - continue Protonix - pt has a previously scheduled Cardiology appointment on 10/06/13  UTI: Nitrite neg, Leuk pos, WBC 21-50; + suprapubic tenderness, asymptomatic  - ceftriaxone --> Levaquin po 750 mg daily x 5 days (treating as complicated in the setting of DM) - f/u urine culture   HTN: Continue antihypertensives   Hypothyroidism: Continue Synthroid   Depression: Continue Zoloft   VTE ppx: Heparin [place]  Diet: Heart healthy/carb mod  Code: Full  Dispo: The patient is stable for discharge home today with follow-up in the [facility] on 10/05/13.  The patient does have a current PCP Rebecca Eaton, MD) and does need an [facility] hospital follow-up appointment after discharge.  The patient does not know have transportation limitations that hinder transportation to clinic appointments.  .Services Needed at time of discharge: Y = Yes, Blank = No PT:   OT:   RN:   Equipment:   Other:     LOS: 1 day   Duwaine Maxin, DO 10/02/2013, 11:02 AM

## 2013-10-03 LAB — URINE CULTURE: Colony Count: 5000

## 2013-10-05 ENCOUNTER — Encounter: Payer: Self-pay | Admitting: Internal Medicine

## 2013-10-05 ENCOUNTER — Ambulatory Visit: Payer: Self-pay | Admitting: Internal Medicine

## 2013-10-05 ENCOUNTER — Other Ambulatory Visit (HOSPITAL_COMMUNITY)
Admission: RE | Admit: 2013-10-05 | Discharge: 2013-10-05 | Disposition: A | Discharge status: Home / Self Care | Payer: PRIVATE HEALTH INSURANCE | Source: Ambulatory Visit | Attending: Internal Medicine | Admitting: Internal Medicine

## 2013-10-05 ENCOUNTER — Ambulatory Visit (INDEPENDENT_AMBULATORY_CARE_PROVIDER_SITE_OTHER): Payer: PRIVATE HEALTH INSURANCE | Admitting: Internal Medicine

## 2013-10-05 VITALS — BP 127/78 | HR 81 | Temp 97.7°F | Ht 62.0 in | Wt 192.9 lb

## 2013-10-05 DIAGNOSIS — N949 Unspecified condition associated with female genital organs and menstrual cycle: Secondary | ICD-10-CM

## 2013-10-05 DIAGNOSIS — N760 Acute vaginitis: Secondary | ICD-10-CM | POA: Insufficient documentation

## 2013-10-05 DIAGNOSIS — IMO0002 Reserved for concepts with insufficient information to code with codable children: Secondary | ICD-10-CM

## 2013-10-05 DIAGNOSIS — N390 Urinary tract infection, site not specified: Secondary | ICD-10-CM

## 2013-10-05 DIAGNOSIS — E1149 Type 2 diabetes mellitus with other diabetic neurological complication: Secondary | ICD-10-CM

## 2013-10-05 DIAGNOSIS — E1165 Type 2 diabetes mellitus with hyperglycemia: Secondary | ICD-10-CM

## 2013-10-05 LAB — BASIC METABOLIC PANEL WITH GFR
BUN: 13 mg/dL (ref 6–23)
CHLORIDE: 97 meq/L (ref 96–112)
CO2: 30 meq/L (ref 19–32)
Calcium: 9.8 mg/dL (ref 8.4–10.5)
Creat: 1.45 mg/dL — ABNORMAL HIGH (ref 0.50–1.10)
GFR, Est African American: 47 mL/min — ABNORMAL LOW
GFR, Est Non African American: 41 mL/min — ABNORMAL LOW
GLUCOSE: 464 mg/dL — AB (ref 70–99)
POTASSIUM: 4.6 meq/L (ref 3.5–5.3)
Sodium: 134 mEq/L — ABNORMAL LOW (ref 135–145)

## 2013-10-05 LAB — GLUCOSE, CAPILLARY
Glucose-Capillary: 522 mg/dL — ABNORMAL HIGH (ref 70–99)
Glucose-Capillary: 395 mg/dL — ABNORMAL HIGH (ref 70–99)

## 2013-10-05 MED ORDER — INSULIN ASPART 100 UNIT/ML ~~LOC~~ SOLN
10.0000 [IU] | Freq: Once | SUBCUTANEOUS | Status: AC
  Administered 2013-10-05: 10 [IU] via SUBCUTANEOUS

## 2013-10-05 MED ORDER — FLUCONAZOLE 150 MG PO TABS: 150.0000 mg | ORAL_TABLET | Freq: Every day | ORAL | Status: DC

## 2013-10-05 NOTE — Progress Notes (Signed)
Patient ID: SYNIAH BERNE, female   DOB: 12-Jun-1955, 58 y.o.   MRN: [id number]   Subjective:   Patient ID: YULEIDY RAPPLEYE female   DOB: [birth year] 58 y.o.   MRN: [id number]  HPI: Ms. PRIYANKA CAUSEY is a 58 y.o. female w/ PMHx of DM type II, Asthma, HTN, HLD, CKD, stage III, Hypothyroidism, Gout, Depression, and Schizophrenia, presents to the clinic today for a hospital follow-up visit. Patient was recently admitted for hyperglycemia (most recent HbA1c 15.5 in 08/2013). Was discharged on Lantus 25 units + Novolog 4 units tid w/ meals. Today, patient presents w/ CBG of 522. After some discussion, patient admits to poor compliance w/ her Lantus since her hospital discharge and says she has only taken Novolog 5 units this AM. The patient admits to associated lightheadedness and blurry vision over the past day or so. She denies any loss of consciousness, tremulousness, diaphoresis, or dizziness.  Today the patient also complains of vaginal discomfort. She claims she has pruritis and discharge over the past 2-3 days. The patient was also treated for a UTI in the hospital, claims she has almost completed her course of Levaquin. Also admits to dysuria. Denies any pelvic pain or hematuria.   Past Medical History  Diagnosis Date  . Asthma     past 10 years  . Diabetes mellitus     past 5 years  . Glaucoma     dx 06/2011-[facility]  . Hypertension   . Hyperlipidemia   . Hyperthyroidism   . History of TIA (transient ischemic attack)     01/06/2011, x2  . Pancreatitis   . Gout   . Depression   . Renal disorder     chronic kidney disease, stage 3  . Chronic pain   . Glaucoma   . Arthritis     djd -lower back, left knee  . Degenerative disc disease   . Bipolar disorder   . Gastritis   . COPD (chronic obstructive pulmonary disease)   . Schizophrenia   . 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 Fatty liver     mild CT 06/2012  . DKA (diabetic ketoacidoses) 07/21/2013  . Stroke    TIAs  . Esophageal ring, acquired 08/12/2013  . Cocaine abuse   . Chronic abdominal pain    Current Outpatient Prescriptions  Medication Sig Dispense Refill  . ACCU-CHEK FASTCLIX LANCETS MISC Check blood sugar 4 times a day Dx code- 250.00  102 each  5  . albuterol (PROVENTIL HFA;VENTOLIN HFA) 108 (90 BASE) MCG/ACT inhaler Inhale 2 puffs into the lungs every 6 (six) hours as needed for wheezing.  1 Inhaler  2  . albuterol (PROVENTIL) (2.5 MG/3ML) 0.083% nebulizer solution Inhale 2.5 mg into the lungs every 6 (six) hours as needed for wheezing.      Marland Kitchen allopurinol (ZYLOPRIM) 300 MG tablet Take 300 mg by mouth daily. For gout.      Marland Kitchen ALPRAZolam (XANAX) 0.5 MG tablet Take 0.5 mg by mouth 3 (three) times daily.      Marland Kitchen amLODipine (NORVASC) 5 MG tablet Take 5 mg by mouth daily. For hypertension.      Marland Kitchen aspirin EC 81 MG tablet Take 81 mg by mouth daily.      Marland Kitchen atorvastatin (LIPITOR) 80 MG tablet Take 80 mg by mouth daily.      . Blood Glucose Monitoring Suppl (ACCU-CHEK NANO SMARTVIEW) W/DEVICE KIT 1 each by Does not apply route 2 (two) times daily.  1 kit  0  . Brinzolamide-Brimonidine (SIMBRINZA) 1-0.2 % SUSP Place 1 drop into both eyes 2 (two) times daily.      . clotrimazole (GYNE-LOTRIMIN) 1 % vaginal cream Place 1 Applicatorful vaginally at bedtime. Use for 7 days.  45 g  0  . colchicine 0.6 MG tablet Take 1 tablet (0.6 mg total) by mouth daily as needed (for gout flare-ups).      . divalproex (DEPAKOTE) 500 MG DR tablet Take 1,000 mg by mouth at bedtime.       . gabapentin (NEURONTIN) 100 MG capsule Take 300 mg by mouth 2 (two) times daily.      Marland Kitchen gemfibrozil (LOPID) 600 MG tablet Take 600 mg by mouth daily.       Marland Kitchen glucose blood (ACCU-CHEK SMARTVIEW) test strip Check blood sugar 4 times a day Dx code- 250.00  150 each  6  . insulin aspart (NOVOLOG) 100 UNIT/ML injection Inject 4 Units into the skin 3 (three) times daily before meals.      . insulin glargine (LANTUS) 100 UNIT/ML injection  Inject 0.25 mLs (25 Units total) into the skin at bedtime.  10 mL  1  . Insulin Syringe-Needle U-100 31G X 15/64" 0.3 ML MISC Check blood sugar 4 times a day Dx code- 250.00  200 each  5  . isometheptene-acetaminophen-dichloralphenazone (MIDRIN) 65-325-100 MG capsule Take 1 capsule by mouth 4 (four) times daily as needed for migraine. Maximum 5 capsules in 12 hours for migraine headaches, 8 capsules in 24 hours for tension headaches.  30 capsule  1  . latanoprost (XALATAN) 0.005 % ophthalmic solution Place 1 drop into both eyes 2 (two) times daily.      Marland Kitchen levofloxacin (LEVAQUIN) 750 MG tablet Take 1 tablet (750 mg total) by mouth daily.  4 tablet  0  . levothyroxine (SYNTHROID, LEVOTHROID) 50 MCG tablet Take 1 tablet (50 mcg total) by mouth daily before breakfast.  30 tablet  2  . lisinopril (PRINIVIL,ZESTRIL) 40 MG tablet Take 1 tablet (40 mg total) by mouth daily.  30 tablet  3  . mometasone-formoterol (DULERA) 100-5 MCG/ACT AERO Inhale 2 puffs into the lungs 2 (two) times daily.  1 Inhaler    . nitroGLYCERIN (NITROSTAT) 0.4 MG SL tablet Place 1 tablet (0.4 mg total) under the tongue every 5 (five) minutes as needed for chest pain.  30 tablet  3  . ondansetron (ZOFRAN ODT) 8 MG disintegrating tablet Take 1 tablet (8 mg total) by mouth every 8 (eight) hours as needed for nausea or vomiting.  12 tablet  0  . pantoprazole (PROTONIX) 40 MG tablet Take 1 tablet (40 mg total) by mouth daily. For reflux.  30 tablet  11  . sertraline (ZOLOFT) 100 MG tablet Take 1 tablet (100 mg total) by mouth daily.  30 tablet  0  . topiramate (TOPAMAX) 25 MG tablet Take one tablet at night for one week, then take 2 tablets at night for one week, then take 3 tablets at night.      . traMADol (ULTRAM) 50 MG tablet Take 1 tablet (50 mg total) by mouth every 6 (six) hours as needed.  20 tablet  0   No current facility-administered medications for this visit.   Family History  Problem Relation Age of Onset  . Arthritis  Mother   . Diabetes Mother   . Hypertension Mother   . Kidney disease Mother   . Heart disease Mother     died 30  .  Heart disease      mother & father  . Hypertension      mother & father  . Colon cancer Neg Hx   . Prostate cancer Neg Hx   . Breast cancer Neg Hx   . Stomach cancer Neg Hx   . Esophageal cancer Neg Hx   . Migraines Neg Hx   . Hypertension Sister   . Hypertension Brother   . Kidney disease Brother    History   Social History  . Marital Status: Single    Spouse Name: N/A    Number of Children: 3  . Years of Education: college   Occupational History  . Disability    Social History Main Topics  . Smoking status: Current Every Day Smoker -- 0.02 packs/day for 30 years    Types: Cigarettes  . Smokeless tobacco: Never Used     Comment: smokes 2 -3 cigs daily/uses patches.  . Alcohol Use: No     Comment: denied alcohol at this time  . Drug Use: Yes    Special: Cocaine     Comment: denies.  . Sexual Activity: None     Comment: hysterectomy   Other Topics Concern  . None   Social History Narrative   On disability   Single 3 sons   3 coffee/day   3 regular pepsi/day   Updated 08/11/2013                  Review of Systems: General: Denies fever, chills, diaphoresis, appetite change and fatigue.  Respiratory: Denies SOB, DOE, cough, chest tightness, and wheezing.   Cardiovascular: Denies chest pain and palpitations.  Gastrointestinal: Denies nausea, vomiting, abdominal pain, diarrhea, constipation, blood in stool and abdominal distention.  Genitourinary: Positive for dysuria, vaginal pruritis, and discharge. Denies urgency, frequency, hematuria, and flank pain. Endocrine: Denies hot or cold intolerance, polyuria, and polydipsia. Musculoskeletal: Denies myalgias, back pain, joint swelling, arthralgias and gait problem.  Skin: Denies pallor, rash and wounds.  Neurological: Positive for lightheadedness and blurry vision. Denies dizziness, seizures,  syncope, weakness, numbness and headaches.  Psychiatric/Behavioral: Denies mood changes, confusion, nervousness, sleep disturbance and agitation.  Objective:   Physical Exam: Filed Vitals:   10/05/13 0901  BP: 127/78  Pulse: 81  Temp: 97.7 F (36.5 C)  TempSrc: Oral  Height: 5' 2" (1.575 m)  Weight: 192 lb 14.4 oz (87.499 kg)  SpO2: 100%   General: Vital signs reviewed.  Patient is an obese female, in no acute distress and cooperative with exam.  Head: Normocephalic and atraumatic. Eyes: PERRL, EOMI, conjunctivae normal, No scleral icterus.  Neck: Supple, trachea midline, normal ROM, No JVD, masses, thyromegaly, or carotid bruit present.  Cardiovascular: RRR, S1 normal, S2 normal, no murmurs, gallops, or rubs. Pulmonary/Chest: Air entry equal bilaterally, no wheezes, rales, or rhonchi. Abdominal: Soft, non-tender, non-distended, BS +, no masses, organomegaly, or guarding present.  Genitourinary: Multiple small plaques on vulva, whitish cervical discharge. No cervical motion tenderness.  Musculoskeletal: No joint deformities, erythema, or stiffness, ROM full and nontender. Extremities: No swelling or edema,  pulses symmetric and intact bilaterally. No cyanosis or clubbing. Neurological: A&O x3, Strength is normal and symmetric bilaterally, cranial nerve II-XII are grossly intact, no focal motor deficit, sensory intact to light touch bilaterally.  Skin: Warm, dry and intact. No rashes or erythema. Psychiatric: Normal mood and affect. speech and behavior is normal. Cognition and memory are normal.   Assessment & Plan:   Please see problem based assessment and plan.

## 2013-10-05 NOTE — Care Management Note (Signed)
    Page 1 of 1   10/05/2013     6:00:41 PM CARE MANAGEMENT NOTE 10/05/2013  Patient:  DANAJAH, BIRDSELL   Account Number:  [id number]  Date Initiated:  10/05/2013  Documentation initiated by:  Tomi Bamberger  Subjective/Objective Assessment:   dx hyperglycemia  admit as observsation     Action/Plan:   Anticipated DC Date:  09/25/2013   Anticipated DC Plan:  [place]  CM consult      Choice offered to / List presented to:             Status of service:  Completed, signed off Medicare Important Message given?   (If response is "NO", the following Medicare IM given date fields will be blank) Date Medicare IM given:   Date Additional Medicare IM given:    Discharge Disposition:  HOME/SELF CARE  Per UR Regulation:  Reviewed for med. necessity/level of care/duration of stay  If discussed at [place] of Stay Meetings, dates discussed:    Comments:

## 2013-10-05 NOTE — Assessment & Plan Note (Addendum)
Patient describes vaginal pruritis, discharge, and dysuria. No foul odor or abdominal pain. Claims this has been present for the past 2-3 days, likely associated w/ elevated CBG's. Patient denies unprotected sex. On exam, patient w/ several plaques on labia/vulva significant for yeast. Abundant white cervical discharge.  -Wet prep, UA pending -Do not suspect GC/chlamydia given negative results from 01/2013 and no recent unprotected sex.  -Will give Diflucan 150 mg q72h #2. Discussed w/ pharmacy w/ regards to colchicine + atorvastatin, given short course and low dose colchicine (0.6 mg prn for gout flares), okay for treatment of vaginal candidiasis.

## 2013-10-05 NOTE — Assessment & Plan Note (Addendum)
Patient recently discharged from hospital on 10/02/13 for hyperglycemia, sent home on Lantus 25 units + Novolog 5 units tid wc. Presents to clinic today w/ lightheadedness and blurry vision, CBG of 522. After long discussion, patient finally admitted to non-compliance w/ Lantus last night and has only taken 5 units of Novolog early this morning.  -Gave 10 Novolog in clinic; repeat CBG of 395.  -Continue current Insulin regimen; Lantus 25 units qhs + Novolog 5 units tid wc. Patient understands dangers of poor compliance as well as associated yeast infection (described below).  -BMP pending -Repeat HbA1c in 11/2013.

## 2013-10-05 NOTE — Assessment & Plan Note (Addendum)
Patient still w/ dysuria, most likely related to vaginal candidiasis rather than ongoing urinary tract infection. Still taking Levaquin (today is last day).  -Repeat UA today -Wet prep as below

## 2013-10-05 NOTE — Patient Instructions (Signed)
1. Please schedule a follow up appointment for 1-2 weeks.   2. Please take all medications as prescribed.  PLEASE TAKE LANTUS AND NOVOLOG AS PRESCRIBED.  Please take Diflucan 150 mg TODAY and again in 3 days.   3. If you have worsening of your symptoms or new symptoms arise, please call the clinic ([phone]), or go to the ER immediately if symptoms are severe.

## 2013-10-06 ENCOUNTER — Other Ambulatory Visit: Payer: Self-pay | Admitting: Internal Medicine

## 2013-10-06 ENCOUNTER — Encounter: Payer: PRIVATE HEALTH INSURANCE | Admitting: Cardiovascular Disease

## 2013-10-06 DIAGNOSIS — N949 Unspecified condition associated with female genital organs and menstrual cycle: Secondary | ICD-10-CM

## 2013-10-06 DIAGNOSIS — IMO0002 Reserved for concepts with insufficient information to code with codable children: Secondary | ICD-10-CM

## 2013-10-06 DIAGNOSIS — E1165 Type 2 diabetes mellitus with hyperglycemia: Secondary | ICD-10-CM

## 2013-10-06 DIAGNOSIS — E1149 Type 2 diabetes mellitus with other diabetic neurological complication: Secondary | ICD-10-CM

## 2013-10-06 LAB — URINALYSIS, MICROSCOPIC ONLY
Bacteria, UA: NONE SEEN
CRYSTALS: NONE SEEN
Casts: NONE SEEN

## 2013-10-06 LAB — URINALYSIS, ROUTINE W REFLEX MICROSCOPIC
BILIRUBIN URINE: NEGATIVE
Hgb urine dipstick: NEGATIVE
Ketones, ur: NEGATIVE mg/dL
Nitrite: NEGATIVE
PH: 7 (ref 5.0–8.0)
Protein, ur: NEGATIVE mg/dL
SPECIFIC GRAVITY, URINE: 1.023 (ref 1.005–1.030)
Urobilinogen, UA: 0.2 mg/dL (ref 0.0–1.0)

## 2013-10-06 LAB — CERVICOVAGINAL ANCILLARY ONLY
WET PREP (BD AFFIRM): NEGATIVE
WET PREP (BD AFFIRM): POSITIVE — AB
Wet Prep (BD Affirm): NEGATIVE

## 2013-10-06 MED ORDER — METRONIDAZOLE 500 MG PO TABS: 500.0000 mg | ORAL_TABLET | Freq: Two times a day (BID) | ORAL | Status: DC

## 2013-10-06 MED ORDER — METRONIDAZOLE 500 MG PO TABS: 500.0000 mg | ORAL_TABLET | Freq: Three times a day (TID) | ORAL | Status: DC

## 2013-10-06 NOTE — Progress Notes (Signed)
Patient ID: [name], female   DOB: 1955-08-05, 58 y.o.   MRN: [id number]   58 yo poorly controlled diabetic referred for chest pain  Recently in hospital 5/7 for elevated BS and epigastric/ chest pain   She has a PMH of DM type 2, COPD, HTN, depression, hypothyroidism and DDD 5/7  She called EMS because her meter read HIGH  She says she had been feeling "whoozy" for the past few days and her CBGs have been 300-500 the past few weeks. She reports compliance with Lantus 10 unit qHS and 4 units Novolog TID. She also reports chest pain that she describes as both pressure and burning. The pain is central chest and upper abdominal 7/10 and radiates to the back. She says the pain feels like when she has had pancreatitis in the past. She says she took Protonix at home which helped the pain. She says she usually takes oxycodone for pain but has run out. She also notes a cough and some nausea. She has not felt feverish.  In hospital R/O pain thought to be non cardiac  Despite poorly controlled DM has not had stress testing         ROS: Denies fever, malais, weight loss, blurry vision, decreased visual acuity, cough, sputum, SOB, hemoptysis, pleuritic pain, palpitaitons, heartburn, abdominal pain, melena, lower extremity edema, claudication, or rash.  All other systems reviewed and negative   General: Affect appropriate Healthy:  appears stated age 77: normal Neck supple with no adenopathy JVP normal no bruits no thyromegaly Lungs clear with no wheezing and good diaphragmatic motion Heart:  S1/S2 no murmur,rub, gallop or click PMI normal Abdomen: benighn, BS positve, no tenderness, no AAA no bruit.  No HSM or HJR Distal pulses intact with no bruits No edema Neuro non-focal Skin warm and dry No muscular weakness  Medications Current Outpatient Prescriptions  Medication Sig Dispense Refill  . ACCU-CHEK FASTCLIX LANCETS MISC Check blood sugar 4 times a day Dx code- 250.00  102 each  5    . albuterol (PROVENTIL HFA;VENTOLIN HFA) 108 (90 BASE) MCG/ACT inhaler Inhale 2 puffs into the lungs every 6 (six) hours as needed for wheezing.  1 Inhaler  2  . albuterol (PROVENTIL) (2.5 MG/3ML) 0.083% nebulizer solution Inhale 2.5 mg into the lungs every 6 (six) hours as needed for wheezing.      Marland Kitchen allopurinol (ZYLOPRIM) 300 MG tablet Take 300 mg by mouth daily. For gout.      Marland Kitchen ALPRAZolam (XANAX) 0.5 MG tablet Take 0.5 mg by mouth 3 (three) times daily.      Marland Kitchen amLODipine (NORVASC) 5 MG tablet Take 5 mg by mouth daily. For hypertension.      Marland Kitchen aspirin EC 81 MG tablet Take 81 mg by mouth daily.      Marland Kitchen atorvastatin (LIPITOR) 80 MG tablet Take 80 mg by mouth daily.      . Blood Glucose Monitoring Suppl (ACCU-CHEK NANO SMARTVIEW) W/DEVICE KIT 1 each by Does not apply route 2 (two) times daily.  1 kit  0  . Brinzolamide-Brimonidine (SIMBRINZA) 1-0.2 % SUSP Place 1 drop into both eyes 2 (two) times daily.      . clotrimazole (GYNE-LOTRIMIN) 1 % vaginal cream Place 1 Applicatorful vaginally at bedtime. Use for 7 days.  45 g  0  . colchicine 0.6 MG tablet Take 1 tablet (0.6 mg total) by mouth daily as needed (for gout flare-ups).      . divalproex (DEPAKOTE) 500 MG DR tablet  Take 1,000 mg by mouth at bedtime.       . gabapentin (NEURONTIN) 100 MG capsule Take 300 mg by mouth 2 (two) times daily.      Marland Kitchen gemfibrozil (LOPID) 600 MG tablet Take 600 mg by mouth daily.       Marland Kitchen glucose blood (ACCU-CHEK SMARTVIEW) test strip Check blood sugar 4 times a day Dx code- 250.00  150 each  6  . insulin aspart (NOVOLOG) 100 UNIT/ML injection Inject 4 Units into the skin 3 (three) times daily before meals.      . insulin glargine (LANTUS) 100 UNIT/ML injection Inject 0.25 mLs (25 Units total) into the skin at bedtime.  10 mL  1  . Insulin Syringe-Needle U-100 31G X 15/64" 0.3 ML MISC Check blood sugar 4 times a day Dx code- 250.00  200 each  5  . isometheptene-acetaminophen-dichloralphenazone (MIDRIN) 65-325-100 MG  capsule Take 1 capsule by mouth 4 (four) times daily as needed for migraine. Maximum 5 capsules in 12 hours for migraine headaches, 8 capsules in 24 hours for tension headaches.  30 capsule  1  . latanoprost (XALATAN) 0.005 % ophthalmic solution Place 1 drop into both eyes 2 (two) times daily.      Marland Kitchen levofloxacin (LEVAQUIN) 750 MG tablet Take 1 tablet (750 mg total) by mouth daily.  4 tablet  0  . levothyroxine (SYNTHROID, LEVOTHROID) 50 MCG tablet Take 1 tablet (50 mcg total) by mouth daily before breakfast.  30 tablet  2  . lisinopril (PRINIVIL,ZESTRIL) 40 MG tablet Take 1 tablet (40 mg total) by mouth daily.  30 tablet  3  . metroNIDAZOLE (FLAGYL) 500 MG tablet Take 1 tablet (500 mg total) by mouth 2 (two) times daily.  14 tablet  0  . mometasone-formoterol (DULERA) 100-5 MCG/ACT AERO Inhale 2 puffs into the lungs 2 (two) times daily.  1 Inhaler    . nitroGLYCERIN (NITROSTAT) 0.4 MG SL tablet Place 1 tablet (0.4 mg total) under the tongue every 5 (five) minutes as needed for chest pain.  30 tablet  3  . ondansetron (ZOFRAN ODT) 8 MG disintegrating tablet Take 1 tablet (8 mg total) by mouth every 8 (eight) hours as needed for nausea or vomiting.  12 tablet  0  . pantoprazole (PROTONIX) 40 MG tablet Take 1 tablet (40 mg total) by mouth daily. For reflux.  30 tablet  11  . sertraline (ZOLOFT) 100 MG tablet Take 1 tablet (100 mg total) by mouth daily.  30 tablet  0  . topiramate (TOPAMAX) 25 MG tablet Take one tablet at night for one week, then take 2 tablets at night for one week, then take 3 tablets at night.      . traMADol (ULTRAM) 50 MG tablet Take 1 tablet (50 mg total) by mouth every 6 (six) hours as needed.  20 tablet  0   No current facility-administered medications for this visit.    Allergies Acetaminophen; Hydrocodone-acetaminophen; and Metformin  Family History: Family History  Problem Relation Age of Onset  . Arthritis Mother   . Diabetes Mother   . Hypertension Mother   .  Kidney disease Mother   . Heart disease Mother     died 75  . Heart disease      mother & father  . Hypertension      mother & father  . Colon cancer Neg Hx   . Prostate cancer Neg Hx   . Breast cancer Neg Hx   . Stomach cancer  Neg Hx   . Esophageal cancer Neg Hx   . Migraines Neg Hx   . Hypertension Sister   . Hypertension Brother   . Kidney disease Brother     Social History: History   Social History  . Marital Status: Single    Spouse Name: N/A    Number of Children: 3  . Years of Education: college   Occupational History  . Disability    Social History Main Topics  . Smoking status: Current Every Day Smoker -- 0.02 packs/day for 30 years    Types: Cigarettes  . Smokeless tobacco: Never Used     Comment: smokes 2 -3 cigs daily/uses patches.  . Alcohol Use: No     Comment: denied alcohol at this time  . Drug Use: Yes    Special: Cocaine     Comment: denies.  . Sexual Activity: Not on file     Comment: hysterectomy   Other Topics Concern  . Not on file   Social History Narrative   On disability   Single 3 sons   3 coffee/day   3 regular pepsi/day   Updated 08/11/2013                   Electrocardiogram:  SR rate 69 nonspecific ST/T wave changes   Assessment and Plan

## 2013-10-06 NOTE — Assessment & Plan Note (Addendum)
ADDENDUM: Patient shown to have Gardnerella on Wet Prep, negative for Candida. Discussed w/ patient this AM, has not taken Diflucan yet.  -Started patient on Metronidazole 500 mg bid for 7 days and instruct patient to discontinue Diflucan.  -Will follow up on 10/12/13

## 2013-10-06 NOTE — Assessment & Plan Note (Signed)
ADDENDUM: Patient shown to have Cr of 1.45 on BMP drawn yesterday, most likely pre-renal d/t polyuria in the setting of severe hyperglycemia.  -Please repeat BMP during next visit (10/12/13) -If Cr still elevated, discontinue lisinopril

## 2013-10-09 NOTE — Discharge Summary (Signed)
INTERNAL MEDICINE ATTENDING DISCHARGE COSIGN   I evaluated the patient on the day of discharge and discussed the discharge plan with my resident team. I agree with the discharge documentation and disposition.   [name] 10/09/2013, 11:17 AM

## 2013-10-11 ENCOUNTER — Telehealth: Payer: Self-pay | Admitting: Internal Medicine

## 2013-10-11 NOTE — Telephone Encounter (Signed)
   Reason for call:   I received a call from Ms. Forestine Chute at 8:18  PM indicating that her blood sugars were elevated.   Pertinent Data:   Pt called stating that her blood sugar at 6:10pm was 510. She states that her sugars are normally in the 300 range. She takes Lantus 25u qhs and Novolog 5u TID with meals. Her last dose of Novolog 5u was after checking her CBG at 6:10pm. She endorses only noticing increased blurry vision, but denies any other symptoms.    Assessment / Plan / Recommendations:   She was advised to take another 5u of Novolog and then check her blood sugar 1 hour later. If her sugars were still elevated over 300, she was advised to call the on-call pager again.   She was also advised to call the clinic to scheduled an appointment to evaluate and possibly revise her insulin regimen.  As always, pt is advised that if symptoms worsen or new symptoms arise, they should go to an urgent care facility or to to ER for further evaluation.   Otho Bellows, MD   10/11/2013, 8:22 PM

## 2013-10-12 ENCOUNTER — Encounter: Payer: Self-pay | Admitting: Internal Medicine

## 2013-10-12 ENCOUNTER — Ambulatory Visit (INDEPENDENT_AMBULATORY_CARE_PROVIDER_SITE_OTHER): Payer: PRIVATE HEALTH INSURANCE | Admitting: Internal Medicine

## 2013-10-12 ENCOUNTER — Ambulatory Visit (INDEPENDENT_AMBULATORY_CARE_PROVIDER_SITE_OTHER): Payer: PRIVATE HEALTH INSURANCE | Admitting: Dietician

## 2013-10-12 VITALS — BP 120/68 | HR 56 | Temp 97.5°F | Ht 62.0 in | Wt 191.2 lb

## 2013-10-12 VITALS — Wt 191.2 lb

## 2013-10-12 DIAGNOSIS — E1149 Type 2 diabetes mellitus with other diabetic neurological complication: Secondary | ICD-10-CM

## 2013-10-12 DIAGNOSIS — E1165 Type 2 diabetes mellitus with hyperglycemia: Principal | ICD-10-CM

## 2013-10-12 DIAGNOSIS — N949 Unspecified condition associated with female genital organs and menstrual cycle: Secondary | ICD-10-CM

## 2013-10-12 DIAGNOSIS — IMO0002 Reserved for concepts with insufficient information to code with codable children: Secondary | ICD-10-CM

## 2013-10-12 LAB — BASIC METABOLIC PANEL WITH GFR
BUN: 17 mg/dL (ref 6–23)
CO2: 28 meq/L (ref 19–32)
Calcium: 9.5 mg/dL (ref 8.4–10.5)
Chloride: 93 mEq/L — ABNORMAL LOW (ref 96–112)
Creat: 1.43 mg/dL — ABNORMAL HIGH (ref 0.50–1.10)
GFR, EST AFRICAN AMERICAN: 48 mL/min — AB
GFR, Est Non African American: 41 mL/min — ABNORMAL LOW
Glucose, Bld: 569 mg/dL (ref 70–99)
Potassium: 4.6 mEq/L (ref 3.5–5.3)
SODIUM: 130 meq/L — AB (ref 135–145)

## 2013-10-12 LAB — GLUCOSE, CAPILLARY: Glucose-Capillary: 536 mg/dL — ABNORMAL HIGH (ref 70–99)

## 2013-10-12 MED ORDER — INSULIN ASPART 100 UNIT/ML ~~LOC~~ SOLN: 6.0000 [IU] | Freq: Three times a day (TID) | SUBCUTANEOUS | Status: DC

## 2013-10-12 MED ORDER — INSULIN GLARGINE 100 UNIT/ML ~~LOC~~ SOLN: 30.0000 [IU] | Freq: Every day | SUBCUTANEOUS | Status: DC

## 2013-10-12 NOTE — Progress Notes (Signed)
Patient ID: [name], female   DOB: Dec 02, 1955, 57 y.o.   MRN: [id number] HPI The patient is a 58 y.o. female with a history of HTN, COPD, schatzki ring (EGD 07/2013), hypothyroidism, poorly controlled DM type 2, depression/anxiety, bipolar d/o, chronic back pain who presents for a 1 week follow up appointment.  Bacterial Vaginosis: Patient with dysuria, vaginal discharge, vaginal pruritis at her hospital follow up visit last week. Wet prep showed no yeast, but was positive for gardnerella. Pt prescribed metronidazole 590m BID x 7 days--she has finished this prescription. Symptoms have completely resolved since finishing the medication.  DM type 2, poorly controlled: Patient with last Hb A1c 15.5% 08/2013. She is currently taking lantus 25U qHS and novolog 5U TID WC. She has hx of medication noncompliance, especially with regard to her insulin. However, today she is insistent that she has been taking her medications as prescribed. She has also started checking her CBGs and brought in her meter today. CBGs at home are ranging 400-500 with fasting AM CBGs in the 300s-500s range. Per chart review, patient required approx 43U of insulin total during her last hospitalization last week. Denies any symptoms of hypoglycemia (of note, she does have h/o hospitalization a few months ago for hypoglycemia).  ROS: General: no fevers, chills Skin: no rash HEENT:+intermittent blurry vision bilaterally; no hearing changes, sore throat Pulm: no dyspnea, coughing CV: no chest pain, shortness of breath Abd: no abdominal pain, nausea/vomiting, diarrhea GU: +polyuria; no dysuria, hematuria Ext: chronic back pain Neuro: no weakness, numbness, or tingling  Filed Vitals:   10/12/13 1426  BP: 120/68  Pulse: 56  Temp: 97.5 F (36.4 C)   Physical Exam  General: alert, cooperative, and in no apparent distress HEENT: pupils equal round and reactive to light, vision grossly intact, oropharynx clear and  non-erythematous  Neck: supple Lungs: CTAB Heart: RRR Abdomen: soft, non-tender, non-distended, normal bowel sounds Extremities: warm, well perfused; no pedal edema Neurologic: alert & oriented X3, cranial nerves II-XII grossly intact, strength grossly intact, sensation intact to light touch; gait normal  Current Outpatient Prescriptions on File Prior to Visit  Medication Sig Dispense Refill  . ACCU-CHEK FASTCLIX LANCETS MISC Check blood sugar 4 times a day Dx code- 250.00  102 each  5  . albuterol (PROVENTIL HFA;VENTOLIN HFA) 108 (90 BASE) MCG/ACT inhaler Inhale 2 puffs into the lungs every 6 (six) hours as needed for wheezing.  1 Inhaler  2  . albuterol (PROVENTIL) (2.5 MG/3ML) 0.083% nebulizer solution Inhale 2.5 mg into the lungs every 6 (six) hours as needed for wheezing.      .Marland Kitchenallopurinol (ZYLOPRIM) 300 MG tablet Take 300 mg by mouth daily. For gout.      .Marland KitchenALPRAZolam (XANAX) 0.5 MG tablet Take 0.5 mg by mouth 3 (three) times daily.      .Marland KitchenamLODipine (NORVASC) 5 MG tablet Take 5 mg by mouth daily. For hypertension.      .Marland Kitchenaspirin EC 81 MG tablet Take 81 mg by mouth daily.      .Marland Kitchenatorvastatin (LIPITOR) 80 MG tablet Take 80 mg by mouth daily.      . Blood Glucose Monitoring Suppl (ACCU-CHEK NANO SMARTVIEW) W/DEVICE KIT 1 each by Does not apply route 2 (two) times daily.  1 kit  0  . Brinzolamide-Brimonidine (SIMBRINZA) 1-0.2 % SUSP Place 1 drop into both eyes 2 (two) times daily.      . clotrimazole (GYNE-LOTRIMIN) 1 % vaginal cream Place 1 Applicatorful vaginally at bedtime.  Use for 7 days.  45 g  0  . colchicine 0.6 MG tablet Take 1 tablet (0.6 mg total) by mouth daily as needed (for gout flare-ups).      . divalproex (DEPAKOTE) 500 MG DR tablet Take 1,000 mg by mouth at bedtime.       . gabapentin (NEURONTIN) 100 MG capsule Take 300 mg by mouth 2 (two) times daily.      Marland Kitchen gemfibrozil (LOPID) 600 MG tablet Take 600 mg by mouth daily.       Marland Kitchen glucose blood (ACCU-CHEK SMARTVIEW) test  strip Check blood sugar 4 times a day Dx code- 250.00  150 each  6  . insulin aspart (NOVOLOG) 100 UNIT/ML injection Inject 4 Units into the skin 3 (three) times daily before meals.      . insulin glargine (LANTUS) 100 UNIT/ML injection Inject 0.25 mLs (25 Units total) into the skin at bedtime.  10 mL  1  . Insulin Syringe-Needle U-100 31G X 15/64" 0.3 ML MISC Check blood sugar 4 times a day Dx code- 250.00  200 each  5  . isometheptene-acetaminophen-dichloralphenazone (MIDRIN) 65-325-100 MG capsule Take 1 capsule by mouth 4 (four) times daily as needed for migraine. Maximum 5 capsules in 12 hours for migraine headaches, 8 capsules in 24 hours for tension headaches.  30 capsule  1  . latanoprost (XALATAN) 0.005 % ophthalmic solution Place 1 drop into both eyes 2 (two) times daily.      Marland Kitchen levothyroxine (SYNTHROID, LEVOTHROID) 50 MCG tablet Take 1 tablet (50 mcg total) by mouth daily before breakfast.  30 tablet  2  . lisinopril (PRINIVIL,ZESTRIL) 40 MG tablet Take 1 tablet (40 mg total) by mouth daily.  30 tablet  3  . metroNIDAZOLE (FLAGYL) 500 MG tablet Take 1 tablet (500 mg total) by mouth 2 (two) times daily.  14 tablet  0  . mometasone-formoterol (DULERA) 100-5 MCG/ACT AERO Inhale 2 puffs into the lungs 2 (two) times daily.  1 Inhaler    . nitroGLYCERIN (NITROSTAT) 0.4 MG SL tablet Place 1 tablet (0.4 mg total) under the tongue every 5 (five) minutes as needed for chest pain.  30 tablet  3  . ondansetron (ZOFRAN ODT) 8 MG disintegrating tablet Take 1 tablet (8 mg total) by mouth every 8 (eight) hours as needed for nausea or vomiting.  12 tablet  0  . pantoprazole (PROTONIX) 40 MG tablet Take 1 tablet (40 mg total) by mouth daily. For reflux.  30 tablet  11  . sertraline (ZOLOFT) 100 MG tablet Take 1 tablet (100 mg total) by mouth daily.  30 tablet  0  . topiramate (TOPAMAX) 25 MG tablet Take one tablet at night for one week, then take 2 tablets at night for one week, then take 3 tablets at night.       . traMADol (ULTRAM) 50 MG tablet Take 1 tablet (50 mg total) by mouth every 6 (six) hours as needed.  20 tablet  0   No current facility-administered medications on file prior to visit.    Assessment/Plan

## 2013-10-12 NOTE — Assessment & Plan Note (Addendum)
Pt with multiple recent hospitalizations for both hypo and hyperglycemia. Poor control of her diabetes, last A1c 15.5% in 08/2013. We are persistently working on obtaining better control without causing hypoglycemia. We are increasing insulin slowly as she is a risk for hypoglycemia given inconsistent and improper use of her insulin therapy in the past. Pt appears to be doing better as far as her DM management goes. She has started taking CBG measurements at home and brought her meter with her to this visit. CBGs still with very poor control and pt insistent that she is taking her lantus 25U qHS and novolog 5U TID WC. Pt asked about doing a SSI instead of Novolog today as her sugar is more well controlled on SSI while hospitalized. I discussed with her and Butch Penny Plyler that I do not believe pt is a good candidate for SSI given hx of medication noncompliance and confusion surrounding insulin therapy. I do believe that patient is taking her insulin properly. She required about 43U in 24h during her last hospitalization in May 2015. -increase lantus to 30U qHS  -increase Novolog to 6U TID WC -recheck BMP as pt with elevated Cr at her last visit (b/l Cr 1-1.1 and Cr last week was 1.45); likely prerenal from polyuria and pt with persistent polyuria so this may not have improved; if Cr remains elevated in this range we will plan to stop lisinopril until her f/u visit next week where BMP should be rechecked -f/u in 1 week for further management of DM and insulin regimen as I think this patient needs close and frequent follow up  10/13/2013 ADDENDUM: Pt's Cr remains elevated. I attempted to call patient multiple times without any success. Please hold her lisinopril at her follow up visit next week with a plan to recheck her Cr the following week. I suspect this represents a prerenal AKI 2/2 polyuria (due to glucosuria). The main solution to this problem is obtaining better control of CBGs, which we need to do with  caution given recent episode of hypoglycemia.

## 2013-10-12 NOTE — Progress Notes (Signed)
INTERNAL MEDICINE TEACHING ATTENDING ADDENDUM - Jasmia Angst, MD: I reviewed and discussed at the time of visit with the resident Dr. Chikowski, the patient's medical history, physical examination, diagnosis and results of tests and treatment and I agree with the patient's care as documented. 

## 2013-10-12 NOTE — Patient Instructions (Signed)
Thank you for your visit.  We increased your lantus 30 units daily.  We increased your novolog to 6 units three times per day with meals.   We also checked some basic lab work. I will call you with any abnormality.  Please follow up late next week. Call if you need [place] before that.

## 2013-10-12 NOTE — Progress Notes (Signed)
Diabetes Self-Management Education  Visit Number: Follow-up 1  10/12/2013 [name], identified by name and date of birth, is a 58 y.o. female with Type 2 diabetes  .     ASSESSMENT  Patient Concerns:  Medication;Support  Weight 191 lb 3.2 oz (86.728 kg). Body mass index is 34.96 kg/(m^2).  Lab Results: LDL Cholesterol  Date Value Ref Range Status  09/03/2013 UNABLE TO CALCULATE IF TRIGLYCERIDE OVER 400 mg/dL  0 - 99 mg/dL Final                Hemoglobin A1C  Date Value Ref Range Status  09/03/2013 15.5* <5.7 % Final     (NOTE)                                                                               Hemoglobin-A1c  Date Value Ref Range Status  09/03/2008 9.7 (NOTE)  Diabetic Adult       <9.0                                             Healthy Adult   3.9 - 7.3                                        (DCCT/NGSP)                                       Current ADA guidelines recommend a treatment goal of <7.0%  HgbA1c for diabetic patients, which corresponds to a <9.0% Glycohemoglobin result with this method.  * <9.0 % Final     Family History  Problem Relation Age of Onset  . Arthritis Mother   . Diabetes Mother   . Hypertension Mother   . Kidney disease Mother   . Heart disease Mother     died 74  . Heart disease      mother & father  . Hypertension      mother & father  . Colon cancer Neg Hx   . Prostate cancer Neg Hx   . Breast cancer Neg Hx   . Stomach cancer Neg Hx   . Esophageal cancer Neg Hx   . Migraines Neg Hx   . Hypertension Sister   . Hypertension Brother   . Kidney disease Brother    History  Substance Use Topics  . Smoking status: Current Every Day Smoker -- 0.02 packs/day for 30 years    Types: Cigarettes  . Smokeless tobacco: Never Used     Comment: smokes 2 -3 cigs daily/uses patches.  . Alcohol Use: No     Comment: denied alcohol at this time    Support Systems:  Other (comment) Special Needs:     Prior DM Education:   Daily Foot  Exams:   Patient Belief / Attitude about Diabetes:  Diabetes can be controlled  Assessment comments:  Patient initially presented requesting a "  sliding scale" to address her high blood sugars. Blood sugar was 486 today on her meter- her technique was good  Meter doanload showed 100% of readings > 200. Discussed this with physician. Both basal and bolus insulin were increased.  Calculated TDD 44-104, current TDD 37 units/day, correction 1800/40 = 1 unit to drop her CBG ~ 40    Diet Recall: patient did not want to discuss today   Individualized Plan for Diabetes Self-Management Training:  Patient individualized diabetes plan discussed today with patient and includes: monitoring, how to handle highs and lows, Dealing daily with diabetes   Education Topics Reviewed with Patient Today:  Topic Points Discussed  Disease State    Nutrition Management    Physical Activity and Exercise    Medications Patient to try taking lantus earlier in Pm to avoid falling asleep and missing it  Monitoring    Acute Complications    Chronic Complications    Psychosocial Adjustment Identified and addressed patients feelings and concerns about diabetes  Goal Setting    Preconception Care (if applicable)      PATIENTS GOALS   Learning Objective(s):     Goal The patient agrees to:  Nutrition    Physical Activity    Medications take my medication as prescribed  Monitoring    Problem Solving    Reducing Risk    Health Coping     Patient Self-Evaluation of Goals (Subsequent Visits)  Goal The patient rates self as meeting goals (% of time)  Nutrition    Physical Activity    Medications    Monitoring    Problem Solving    Reducing [place] / SUPPORT  Self-Management Support:  [place] office;Family;CDE visits ______________________________________________________________________  Outcomes  Expected Outcomes:  Demonstrated interest in learning. Expect  positive outcomes Self-Care Barriers:  Lack of material resources Education material provided: yes- meter download If problems or questions, patient to contact team via:  Phone Time in: 1400     Time out: 1430 Future DSME appointment: -1 week      [place] 10/12/2013 3:34 PM

## 2013-10-13 ENCOUNTER — Telehealth: Payer: Self-pay | Admitting: Internal Medicine

## 2013-10-13 NOTE — Telephone Encounter (Signed)
Attempted to call patient multiple times today (called both phone numbers listed) with no answer. There was no answering machine on home phone and the cell phone did not specify I had reached [name], therefore I did not leave a voicemail. Patient's creatinine remains elevated and she will need to hold her lisinopril until we can obtain better control of blood sugar and reduce her polyuria (likely the cause of her AKI). Please hold her lisinopril at her next visit next week and recheck Cr the following week.

## 2013-10-15 NOTE — Progress Notes (Signed)
Case discussed with Dr. Jones at the time of the visit.  We reviewed the resident's history and exam and pertinent patient test results.  I agree with the assessment, diagnosis, and plan of care documented in the resident's note. 

## 2013-10-20 ENCOUNTER — Ambulatory Visit (INDEPENDENT_AMBULATORY_CARE_PROVIDER_SITE_OTHER): Payer: PRIVATE HEALTH INSURANCE | Admitting: Internal Medicine

## 2013-10-20 ENCOUNTER — Encounter: Payer: Self-pay | Admitting: Internal Medicine

## 2013-10-20 ENCOUNTER — Ambulatory Visit: Payer: Self-pay | Admitting: Internal Medicine

## 2013-10-20 ENCOUNTER — Ambulatory Visit (INDEPENDENT_AMBULATORY_CARE_PROVIDER_SITE_OTHER): Payer: PRIVATE HEALTH INSURANCE | Admitting: Dietician

## 2013-10-20 VITALS — BP 129/78 | HR 62 | Temp 96.4°F | Ht 62.0 in | Wt 192.2 lb

## 2013-10-20 DIAGNOSIS — E1149 Type 2 diabetes mellitus with other diabetic neurological complication: Secondary | ICD-10-CM

## 2013-10-20 DIAGNOSIS — G8929 Other chronic pain: Secondary | ICD-10-CM

## 2013-10-20 DIAGNOSIS — IMO0002 Reserved for concepts with insufficient information to code with codable children: Secondary | ICD-10-CM

## 2013-10-20 DIAGNOSIS — E1165 Type 2 diabetes mellitus with hyperglycemia: Principal | ICD-10-CM

## 2013-10-20 DIAGNOSIS — N179 Acute kidney failure, unspecified: Secondary | ICD-10-CM | POA: Insufficient documentation

## 2013-10-20 DIAGNOSIS — M549 Dorsalgia, unspecified: Secondary | ICD-10-CM

## 2013-10-20 MED ORDER — TRAMADOL HCL 50 MG PO TABS: 50.0000 mg | ORAL_TABLET | Freq: Four times a day (QID) | ORAL | Status: DC | PRN

## 2013-10-20 NOTE — Progress Notes (Signed)
Subjective:    Patient ID: [name], female    DOB: 10/17/1955, 58 y.o.   MRN: [id number]  HPI  The patient is a 58 y.o. female with a history of HTN, COPD, schatzki ring (EGD 07/2013), hypothyroidism, poorly controlled DM type 2, depression/anxiety, bipolar d/o, chronic back pain who presents for diabetes follow up.  DM type 2, poorly controlled - Patient with last Hb A1c 15.5% 08/2013. She has a history of multiple recent hospitalizations for both hypo- and hyperglycemia. Providers have been increasing insulin slowly as she is a risk for hypoglycemia given inconsistent and improper use of her insulin therapy in the past. On 10/12/2013 CBG was 569, so her Lantus was increased to 30 units each bedtime, NovoLog increased to 6 units 3 times a day with meals. She returns for 1 week follow up.   She brings her glucometer. She tells me she is taking Lantus 30 units each bedtime but only 5 units Novolog with meals. She denies any low blood sugars. She still having some polyuria, but no dysuria.  AKI - Patient's creatinine was elevated from baseline at 1.43 last visit (usually 1-1.1). Dr. Mechele Claude attempted to get in touch with the patient about holding her lisinopril, but the patient did not pick up the phone. Patient reports she has been taking lisinopril.  Chronic pain - Patient has been denied from 3/4 pain clinics. We are still waiting on the last referral per Lela. She requests more Tramadol. Last filled 09/07/13 #20 by Dr. Mechele Claude.   Current Outpatient Prescriptions on File Prior to Visit  Medication Sig Dispense Refill  . ACCU-CHEK FASTCLIX LANCETS MISC Check blood sugar 4 times a day Dx code- 250.00  102 each  5  . albuterol (PROVENTIL HFA;VENTOLIN HFA) 108 (90 BASE) MCG/ACT inhaler Inhale 2 puffs into the lungs every 6 (six) hours as needed for wheezing.  1 Inhaler  2  . albuterol (PROVENTIL) (2.5 MG/3ML) 0.083% nebulizer solution Inhale 2.5 mg into the lungs every 6 (six) hours as  needed for wheezing.      Marland Kitchen allopurinol (ZYLOPRIM) 300 MG tablet Take 300 mg by mouth daily. For gout.      Marland Kitchen ALPRAZolam (XANAX) 0.5 MG tablet Take 0.5 mg by mouth 3 (three) times daily.      Marland Kitchen amLODipine (NORVASC) 5 MG tablet Take 5 mg by mouth daily. For hypertension.      Marland Kitchen aspirin EC 81 MG tablet Take 81 mg by mouth daily.      Marland Kitchen atorvastatin (LIPITOR) 80 MG tablet Take 80 mg by mouth daily.      . Blood Glucose Monitoring Suppl (ACCU-CHEK NANO SMARTVIEW) W/DEVICE KIT 1 each by Does not apply route 2 (two) times daily.  1 kit  0  . Brinzolamide-Brimonidine (SIMBRINZA) 1-0.2 % SUSP Place 1 drop into both eyes 2 (two) times daily.      . clotrimazole (GYNE-LOTRIMIN) 1 % vaginal cream Place 1 Applicatorful vaginally at bedtime. Use for 7 days.  45 g  0  . colchicine 0.6 MG tablet Take 1 tablet (0.6 mg total) by mouth daily as needed (for gout flare-ups).      . divalproex (DEPAKOTE) 500 MG DR tablet Take 1,000 mg by mouth at bedtime.       . gabapentin (NEURONTIN) 100 MG capsule Take 300 mg by mouth 2 (two) times daily.      Marland Kitchen gemfibrozil (LOPID) 600 MG tablet Take 600 mg by mouth daily.       Marland Kitchen  glucose blood (ACCU-CHEK SMARTVIEW) test strip Check blood sugar 4 times a day Dx code- 250.00  150 each  6  . insulin aspart (NOVOLOG) 100 UNIT/ML injection Inject 6 Units into the skin 3 (three) times daily before meals.  10 mL    . insulin glargine (LANTUS) 100 UNIT/ML injection Inject 0.3 mLs (30 Units total) into the skin at bedtime.  10 mL  1  . Insulin Syringe-Needle U-100 31G X 15/64" 0.3 ML MISC Check blood sugar 4 times a day Dx code- 250.00  200 each  5  . isometheptene-acetaminophen-dichloralphenazone (MIDRIN) 65-325-100 MG capsule Take 1 capsule by mouth 4 (four) times daily as needed for migraine. Maximum 5 capsules in 12 hours for migraine headaches, 8 capsules in 24 hours for tension headaches.  30 capsule  1  . latanoprost (XALATAN) 0.005 % ophthalmic solution Place 1 drop into both eyes 2  (two) times daily.      Marland Kitchen levothyroxine (SYNTHROID, LEVOTHROID) 50 MCG tablet Take 1 tablet (50 mcg total) by mouth daily before breakfast.  30 tablet  2  . lisinopril (PRINIVIL,ZESTRIL) 40 MG tablet Take 1 tablet (40 mg total) by mouth daily.  30 tablet  3  . metroNIDAZOLE (FLAGYL) 500 MG tablet Take 1 tablet (500 mg total) by mouth 2 (two) times daily.  14 tablet  0  . mometasone-formoterol (DULERA) 100-5 MCG/ACT AERO Inhale 2 puffs into the lungs 2 (two) times daily.  1 Inhaler    . nitroGLYCERIN (NITROSTAT) 0.4 MG SL tablet Place 1 tablet (0.4 mg total) under the tongue every 5 (five) minutes as needed for chest pain.  30 tablet  3  . ondansetron (ZOFRAN ODT) 8 MG disintegrating tablet Take 1 tablet (8 mg total) by mouth every 8 (eight) hours as needed for nausea or vomiting.  12 tablet  0  . pantoprazole (PROTONIX) 40 MG tablet Take 1 tablet (40 mg total) by mouth daily. For reflux.  30 tablet  11  . sertraline (ZOLOFT) 100 MG tablet Take 1 tablet (100 mg total) by mouth daily.  30 tablet  0  . topiramate (TOPAMAX) 25 MG tablet Take one tablet at night for one week, then take 2 tablets at night for one week, then take 3 tablets at night.      . traMADol (ULTRAM) 50 MG tablet Take 1 tablet (50 mg total) by mouth every 6 (six) hours as needed.  20 tablet  0    Review of Systems  Constitutional: Negative for fever and chills.  Respiratory: Negative for cough and shortness of breath.   Cardiovascular: Negative for chest pain.  Endocrine: Positive for polyuria.  Genitourinary: Negative for dysuria, vaginal discharge and vaginal pain.  Neurological: Negative for weakness and numbness.       Objective:   Physical Exam  Constitutional: She is oriented to person, place, and time. She appears well-developed and well-nourished.  HENT:  Head: Normocephalic and atraumatic.  Eyes: Conjunctivae and EOM are normal. Pupils are equal, round, and reactive to light.  Neck: Normal range of motion. Neck  supple.  Cardiovascular: Normal rate and regular rhythm.  Exam reveals no gallop and no friction rub.   No murmur heard. Pulmonary/Chest: Effort normal and breath sounds normal. No respiratory distress. She has no wheezes. She has no rales. She exhibits no tenderness.  Abdominal: Soft. She exhibits no distension.  Musculoskeletal: Normal range of motion. She exhibits no edema and no tenderness.  Neurological: She is alert and oriented to person, place,  and time. No cranial nerve deficit.  Skin: Skin is warm and dry.  Psychiatric: She has a normal mood and affect.     Filed Vitals:   10/20/13 1457  BP: 129/78  Pulse: 62  Temp: 96.4 F (35.8 C)       Assessment & Plan:   Please see problem based charting.

## 2013-10-20 NOTE — Assessment & Plan Note (Addendum)
Most likely etiology is prerenal AKI from polyuria secondary to glucosuria. The best way to remedy this problem is to control her blood sugars, which we are working on. - Will recheck BMP today - Instructed patient to hold her lisinopril until followup next week  Creat  Date Value Ref Range Status  10/12/2013 1.43* 0.50 - 1.10 mg/dL Final  10/05/2013 1.45* 0.50 - 1.10 mg/dL Final     Creatinine, Ser  Date Value Ref Range Status  10/02/2013 1.00  0.50 - 1.10 mg/dL Final  10/01/2013 0.94  0.50 - 1.10 mg/dL Final  10/01/2013 1.09  0.50 - 1.10 mg/dL Final    ADDENDUM: Cr back close to baseline at 1.14.  BMET    Component Value Date/Time   NA 136 10/20/2013 1526   K 4.6 10/20/2013 1526   CL 98 10/20/2013 1526   CO2 27 10/20/2013 1526   GLUCOSE 288* 10/20/2013 1526   BUN 13 10/20/2013 1526   CREATININE 1.14* 10/20/2013 1526   CREATININE 1.00 10/02/2013 0708   CALCIUM 9.4 10/20/2013 1526   GFRNONAA 54* 10/20/2013 1526   GFRNONAA 62* 10/02/2013 0708   GFRAA 63 10/20/2013 1526   GFRAA 72* 10/02/2013 0708

## 2013-10-20 NOTE — Patient Instructions (Addendum)
Thank you for your visit. - For your insulin: Increase your Lantus to 34 units at bedtime. Increase your NovoLog to 6 units 3 times a day with meals. - I will call you if your lab work is abnormal. We are checking your kidney function today. - Please hold your lisinopril until your next appointment here in one week. - I have refilled your tramadol. We are still waiting on your pain clinic referral. - Please followup in one week and continue to check your blood sugars regularly.

## 2013-10-20 NOTE — Assessment & Plan Note (Addendum)
Lab Results  Component Value Date   HGBA1C 15.5* 09/03/2013   HGBA1C 15.3* 07/20/2013   HGBA1C 13.6* 04/08/2013     Assessment: Diabetes control:  Poor Progress toward A1C goal:   Unchanged Comments: Glucometer shows all but 1 reading "above range" (128 x1 at 4:43pm on 5/21), no lows. Average 421, highest 583. No clear pattern, she is high at all times of day.  Plan: Medications:    Increase Lantus to 34 units each bedtime, increase NovoLog to 6 units 3 times a day with meals (this increase was made last visit but patient did not comply) Home glucose monitoring: Frequency:  4 times per day Timing:  Before meals and at bedtime Instruction/counseling given: reminded to bring blood glucose meter & log to each visit and reminded to bring medications to each visit Other plans: Patient to meet with [name] after our visit. Followup in one week for continued insulin titration.

## 2013-10-20 NOTE — Assessment & Plan Note (Signed)
Patient is in the process of being referred to a pain clinic. Apparently she has been denied by 3 out of 4 so far, due to recent cocaine use. She's been told numerous times in the past that her clinic no longer prescribe narcotics to her. - Refilled tramadol 50 mg #20 - Await response from final pain clinic

## 2013-10-20 NOTE — Progress Notes (Signed)
Medical Nutrition Therapy:  Appt start time: 3:45pm end time: 4:05pm Assessment:  Primary concerns today: Blood sugar control.  Patient's blood glucose reading in office was 345.   Patient has understanding of food items that increase blood sugar based on being able to identifying some items in her 24 hour recall.    She reports purposefulfy varying food intake which is likely impacting blood sugar control.   24-hr recall:  (Up at  AM) B ( 8AM)-  1/2 cup corned beef hash (potatoes, onions, canned corn beef), scrambled egg, 1/2 cup grits, coffee with cream and equal, 8oz juice L ( 1PM)- bottle of water, 8 cherries, a strawberry             Sometimes eats 1 slice of bread with cheese and meat and mayo and a few chips with either an orange or 1/2 banana  Snk ( 3PM)- sometimes lucky charms with milk D ( PM)- fried fish in vegetable oil (3oz), 1/2 cup brown rice, 1/2 cup corn  Snk ( PM)-watermelon      Progress Towards Goal(s):  In progress.   Nutritional Diagnosis:  NB-1.1 Food and nutrition-related knowledge deficit As related to lack of sufficient prior nutrition-related education.  As evidenced by chronically high blood glucose readings,.    Intervention:  Nutrition-related education.Explained relationship between food and meal time insulin.  Suggested to help make blood sugar control better by having the same amount of food at each meal. Coordination of care-encouraged patient to discuss insulin options that would fit her food pattern preferences with physician   Monitoring/Evaluation:  Dietary intake, exercise, meter, and body weight in 1 week(s).

## 2013-10-21 LAB — BASIC METABOLIC PANEL WITH GFR
BUN: 13 mg/dL (ref 6–23)
CHLORIDE: 98 meq/L (ref 96–112)
CO2: 27 mEq/L (ref 19–32)
Calcium: 9.4 mg/dL (ref 8.4–10.5)
Creat: 1.14 mg/dL — ABNORMAL HIGH (ref 0.50–1.10)
GFR, EST AFRICAN AMERICAN: 63 mL/min
GFR, EST NON AFRICAN AMERICAN: 54 mL/min — AB
Glucose, Bld: 288 mg/dL — ABNORMAL HIGH (ref 70–99)
POTASSIUM: 4.6 meq/L (ref 3.5–5.3)
Sodium: 136 mEq/L (ref 135–145)

## 2013-10-24 NOTE — Progress Notes (Signed)
Case discussed with Dr. Cater at time of visit.  We reviewed the resident's history and exam and pertinent patient test results.  I agree with the assessment, diagnosis, and plan of care documented in the resident's note. 

## 2013-10-26 HISTORY — PX: REFRACTIVE SURGERY: SHX103

## 2013-10-27 ENCOUNTER — Ambulatory Visit (INDEPENDENT_AMBULATORY_CARE_PROVIDER_SITE_OTHER): Payer: PRIVATE HEALTH INSURANCE | Admitting: Internal Medicine

## 2013-10-27 ENCOUNTER — Encounter: Payer: Self-pay | Admitting: Internal Medicine

## 2013-10-27 VITALS — BP 155/85 | HR 53 | Temp 96.3°F | Ht 62.0 in | Wt 200.0 lb

## 2013-10-27 DIAGNOSIS — E039 Hypothyroidism, unspecified: Secondary | ICD-10-CM

## 2013-10-27 DIAGNOSIS — K219 Gastro-esophageal reflux disease without esophagitis: Secondary | ICD-10-CM

## 2013-10-27 DIAGNOSIS — IMO0002 Reserved for concepts with insufficient information to code with codable children: Secondary | ICD-10-CM

## 2013-10-27 DIAGNOSIS — E1165 Type 2 diabetes mellitus with hyperglycemia: Principal | ICD-10-CM

## 2013-10-27 DIAGNOSIS — E1149 Type 2 diabetes mellitus with other diabetic neurological complication: Secondary | ICD-10-CM

## 2013-10-27 DIAGNOSIS — I10 Essential (primary) hypertension: Secondary | ICD-10-CM

## 2013-10-27 DIAGNOSIS — N179 Acute kidney failure, unspecified: Secondary | ICD-10-CM

## 2013-10-27 LAB — BASIC METABOLIC PANEL WITH GFR
BUN: 12 mg/dL (ref 6–23)
CO2: 28 mEq/L (ref 19–32)
Calcium: 9.2 mg/dL (ref 8.4–10.5)
Chloride: 98 mEq/L (ref 96–112)
Creat: 1.12 mg/dL — ABNORMAL HIGH (ref 0.50–1.10)
GFR, EST AFRICAN AMERICAN: 64 mL/min
GFR, EST NON AFRICAN AMERICAN: 55 mL/min — AB
Glucose, Bld: 289 mg/dL — ABNORMAL HIGH (ref 70–99)
POTASSIUM: 4.1 meq/L (ref 3.5–5.3)
SODIUM: 135 meq/L (ref 135–145)

## 2013-10-27 LAB — GLUCOSE, CAPILLARY: Glucose-Capillary: 276 mg/dL — ABNORMAL HIGH (ref 70–99)

## 2013-10-27 MED ORDER — INSULIN GLARGINE 100 UNIT/ML ~~LOC~~ SOLN: 36.0000 [IU] | Freq: Every day | SUBCUTANEOUS | Status: DC

## 2013-10-27 NOTE — Assessment & Plan Note (Addendum)
BP Readings from Last 3 Encounters:  10/27/13 155/85  10/20/13 129/78  10/12/13 120/68   Lab Results  Component Value Date   NA 136 10/20/2013   K 4.6 10/20/2013   CREATININE 1.14* 10/20/2013    Assessment: Blood pressure control: mildly elevated Progress toward BP goal:  deteriorated Comments: lisinopril has been held given AKI, did not take norvasc today due to being in a rush  Plan: Medications:  continue current medications norvasc 5mg , holding lisinopril 40mg  but will likely restart today Educational resources provided:   Self management tools provided:   Other plans: likely will restart lisinopril today pending renal function  Addendum 6/3: Cr continues to trend down, now 1.12. Restarted lisinopril, called in prescription to new pharmacy. Discussed with patient on the phone this morning.

## 2013-10-27 NOTE — Progress Notes (Signed)
Subjective:   Patient ID: [name] female   DOB: [birth year] 58 y.o.   MRN: [id number]  HPI: [name] is a 58 y.o. African American female with a history of HTN, COPD, schatzki ring (EGD 07/2013), hypothyroidism, poorly controlled DM type 2, depression/anxiety, bipolar d/o, and chronic back pain who presents to [facility] today for diabetes follow up.  She was last seen by Dr. Lucila Maine in [facility] for diabetes follow up on 10/20/13 and Lantus was increased to 34 units qhs along with novolog 6 units TID with meals.  Providers have been slowly increasing her lantus dose with caution for hypoglycemia.  She did not bring her meter in today, however reports checking it three times a day and all values are always >200.  Today, fasting without any insulin cbg was 276.  Therefore, we will increase her lantus dose today and she will need to follow up with pcp next month for further adjustment as necessary and recheck A1C. Of note, her last A1C was 15.5 on 08/2013.  She has been drinking a lot of water lately that is making her urinate more frequently and she is also thirsty but mainly when she has been working outside in the heat.   Additionally, she was seen for AKI likely in setting of hyperglycemia with Cr up to 1.43 prior to last visit but improved to 1.14 near to her baseline.  Her lisinopril was still held and is to be evaluated to restart today.  We will recheck bmet today but I suspect will likely restart given trending down renal function, elevated BP and slowly improving glycemic control. Baseline Cr ~1 in 2015.    Finally, she was due for TSH check, which will also be done done and adjustment of levothyroxine dose as needed.  She has a new pharmacy now at [facility] where she would eventually like her medications to go.   Past Medical History  Diagnosis Date  . Asthma     past 10 years  . Diabetes mellitus     past 5 years  . Glaucoma     dx 06/2011-[facility]  . Hypertension   .  Hyperlipidemia   . Hyperthyroidism   . History of TIA (transient ischemic attack)     01/06/2011, x2  . Pancreatitis   . Gout   . Depression   . Renal disorder     chronic kidney disease, stage 3  . Chronic pain   . Glaucoma   . Arthritis     djd -lower back, left knee  . Degenerative disc disease   . Bipolar disorder   . Gastritis   . COPD (chronic obstructive pulmonary disease)   . Schizophrenia   . 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 Fatty liver     mild CT 06/2012  . DKA (diabetic ketoacidoses) 07/21/2013  . Stroke     TIAs  . Esophageal ring, acquired 08/12/2013  . Cocaine abuse   . Chronic abdominal pain    Current Outpatient Prescriptions  Medication Sig Dispense Refill  . ACCU-CHEK FASTCLIX LANCETS MISC Check blood sugar 4 times a day Dx code- 250.00  102 each  5  . albuterol (PROVENTIL HFA;VENTOLIN HFA) 108 (90 BASE) MCG/ACT inhaler Inhale 2 puffs into the lungs every 6 (six) hours as needed for wheezing.  1 Inhaler  2  . albuterol (PROVENTIL) (2.5 MG/3ML) 0.083% nebulizer solution Inhale 2.5 mg into the lungs every 6 (six) hours as needed for wheezing.      Marland Kitchen  allopurinol (ZYLOPRIM) 300 MG tablet Take 300 mg by mouth daily. For gout.      Marland Kitchen ALPRAZolam (XANAX) 0.5 MG tablet Take 0.5 mg by mouth 3 (three) times daily.      Marland Kitchen amLODipine (NORVASC) 5 MG tablet Take 5 mg by mouth daily. For hypertension.      Marland Kitchen aspirin EC 81 MG tablet Take 81 mg by mouth daily.      Marland Kitchen atorvastatin (LIPITOR) 80 MG tablet Take 80 mg by mouth daily.      . Blood Glucose Monitoring Suppl (ACCU-CHEK NANO SMARTVIEW) W/DEVICE KIT 1 each by Does not apply route 2 (two) times daily.  1 kit  0  . Brinzolamide-Brimonidine (SIMBRINZA) 1-0.2 % SUSP Place 1 drop into both eyes 2 (two) times daily.      . divalproex (DEPAKOTE) 500 MG DR tablet Take 1,000 mg by mouth at bedtime.       . gabapentin (NEURONTIN) 100 MG capsule Take 300 mg by mouth 2 (two) times daily.      Marland Kitchen gemfibrozil (LOPID)  600 MG tablet Take 600 mg by mouth daily.       Marland Kitchen glucose blood (ACCU-CHEK SMARTVIEW) test strip Check blood sugar 4 times a day Dx code- 250.00  150 each  6  . insulin aspart (NOVOLOG) 100 UNIT/ML injection Inject 6 Units into the skin 3 (three) times daily before meals.  10 mL    . insulin glargine (LANTUS) 100 UNIT/ML injection Inject 0.36 mLs (36 Units total) into the skin at bedtime.  10 mL  1  . Insulin Syringe-Needle U-100 31G X 15/64" 0.3 ML MISC Check blood sugar 4 times a day Dx code- 250.00  200 each  5  . latanoprost (XALATAN) 0.005 % ophthalmic solution Place 1 drop into both eyes 2 (two) times daily.      Marland Kitchen levothyroxine (SYNTHROID, LEVOTHROID) 50 MCG tablet Take 1 tablet (50 mcg total) by mouth daily before breakfast.  30 tablet  2  . lisinopril (PRINIVIL,ZESTRIL) 40 MG tablet Take 1 tablet (40 mg total) by mouth daily.  30 tablet  3  . mometasone-formoterol (DULERA) 100-5 MCG/ACT AERO Inhale 2 puffs into the lungs 2 (two) times daily.  1 Inhaler    . nitroGLYCERIN (NITROSTAT) 0.4 MG SL tablet Place 1 tablet (0.4 mg total) under the tongue every 5 (five) minutes as needed for chest pain.  30 tablet  3  . pantoprazole (PROTONIX) 40 MG tablet Take 1 tablet (40 mg total) by mouth daily. For reflux.  30 tablet  11  . sertraline (ZOLOFT) 100 MG tablet Take 1 tablet (100 mg total) by mouth daily.  30 tablet  0  . topiramate (TOPAMAX) 25 MG tablet Take one tablet at night for one week, then take 2 tablets at night for one week, then take 3 tablets at night.      . traMADol (ULTRAM) 50 MG tablet Take 1 tablet (50 mg total) by mouth every 6 (six) hours as needed.  20 tablet  0  . colchicine 0.6 MG tablet Take 1 tablet (0.6 mg total) by mouth daily as needed (for gout flare-ups).      . isometheptene-acetaminophen-dichloralphenazone (MIDRIN) 65-325-100 MG capsule Take 1 capsule by mouth 4 (four) times daily as needed for migraine. Maximum 5 capsules in 12 hours for migraine headaches, 8 capsules  in 24 hours for tension headaches.  30 capsule  1  . ondansetron (ZOFRAN ODT) 8 MG disintegrating tablet Take 1 tablet (8 mg  total) by mouth every 8 (eight) hours as needed for nausea or vomiting.  12 tablet  0   No current facility-administered medications for this visit.   Family History  Problem Relation Age of Onset  . Arthritis Mother   . Diabetes Mother   . Hypertension Mother   . Kidney disease Mother   . Heart disease Mother     died 69  . Heart disease      mother & father  . Hypertension      mother & father  . Colon cancer Neg Hx   . Prostate cancer Neg Hx   . Breast cancer Neg Hx   . Stomach cancer Neg Hx   . Esophageal cancer Neg Hx   . Migraines Neg Hx   . Hypertension Sister   . Hypertension Brother   . Kidney disease Brother    History   Social History  . Marital Status: Single    Spouse Name: N/A    Number of Children: 3  . Years of Education: college   Occupational History  . Disability    Social History Main Topics  . Smoking status: Current Every Day Smoker -- 0.02 packs/day for 30 years    Types: Cigarettes  . Smokeless tobacco: Never Used     Comment: smokes 2 -3 cigs daily/uses patches.  . Alcohol Use: No     Comment: denied alcohol at this time  . Drug Use: Yes    Special: Cocaine     Comment: "a little'  . Sexual Activity: None     Comment: hysterectomy   Other Topics Concern  . None   Social History Narrative   On disability   Single 3 sons   3 coffee/day   3 regular pepsi/day   Updated 08/11/2013                  Review of Systems:  Constitutional:  Denies fever, chills  HEENT:  Polydipsia  Respiratory:  Denies SOB, DOE, cough, and wheezing.   Cardiovascular:  Denies chest pain,  Gastrointestinal:  Denies nausea, vomiting, abdominal pain  Genitourinary:  Polyuria.   Musculoskeletal:  Denies gait problem  Skin:  Denies pallor, rash and wound.   Neurological:  Denies syncope   Objective:  Physical Exam: Filed  Vitals:   10/27/13 0843  BP: 155/85  Pulse: 53  Temp: 96.3 F (35.7 C)  TempSrc: Oral  Height: 5' 2"  (1.575 m)  Weight: 200 lb (90.719 kg)  SpO2: 100%   Vitals reviewed. General: sitting in chair, NAD HEENT: EOMI Cardiac: RRR, no rubs, murmurs or gallops Pulm: clear to auscultation bilaterally, no wheezes, rales, or rhonchi Abd: soft, obese, +bs Ext: warm and well perfused, no pedal edema, moving all 4 extremities Neuro: alert and oriented X3, strength and sensation grossly intact  Assessment & Plan:  Discussed with Dr. Daryll Drown Increased Lantus to 36 units, continue novolog 6 units TID RTC next month for recheck a1c and further adjustment of insulin Have CDE call next week to touch base BMET today, likely restart lisinopril if Cr improving

## 2013-10-27 NOTE — Patient Instructions (Signed)
General Instructions:  Dear Ms. Henrene Pastor,  Please increase your lantus to 36 units daily and continue your novolog at 6 units three times a day with meals  Please keep checking your blood sugar three times a day AND BRING IN YOUR METER TO YOUR NEXT VISIT  i will call you with the results of your blood test today to restart your medications or not. And we may also adjust your thyroid medication.  Please bring your medicines with you each time you come to clinic.  Medicines may include prescription medications, over-the-counter medications, herbal remedies, eye drops, vitamins, or other pills.  Progress Toward Treatment Goals:  Treatment Goal 10/27/2013  Hemoglobin A1C unable to assess  Blood pressure deteriorated  Stop smoking smoking the same amount  Prevent falls at goal    Self Care Goals & Plans:  Self Care Goal 10/27/2013  Manage my medications take my medicines as prescribed; bring my medications to every visit; refill my medications on time  Monitor my health keep track of my blood glucose; bring my glucose meter and log to each visit; check my feet daily  Eat healthy foods eat more vegetables; eat foods that are low in salt; eat baked foods instead of fried foods; drink diet soda or water instead of juice or soda  Be physically active take a walk every day  Stop smoking cut down the number of cigarettes smoked; call [facility] (1-800-QUIT-NOW)  Prevent falls wear appropriate shoes  Meeting treatment goals -    Home Blood Glucose Monitoring 01/23/2013  Check my blood sugar 3 times a day    Care Management & Community Referrals:  Referral 01/23/2013  Referrals made for care management support diabetes educator  Referrals made to community resources -    Treatment Goals:  Goals (1 Years of Data) as of 10/27/13         As of Today 10/20/13 10/12/13 10/05/13 10/02/13     Blood Pressure    . Blood Pressure < 140/90  155/85 129/78 120/68 127/78 131/77     Result Component    .  HEMOGLOBIN A1C < 7.0            Progress Toward Treatment Goals:  Treatment Goal 10/27/2013  Hemoglobin A1C unable to assess  Blood pressure deteriorated  Stop smoking smoking the same amount  Prevent falls at goal    Self Care Goals & Plans:  Self Care Goal 10/27/2013  Manage my medications take my medicines as prescribed; bring my medications to every visit; refill my medications on time  Monitor my health keep track of my blood glucose; bring my glucose meter and log to each visit; check my feet daily  Eat healthy foods eat more vegetables; eat foods that are low in salt; eat baked foods instead of fried foods; drink diet soda or water instead of juice or soda  Be physically active take a walk every day  Stop smoking cut down the number of cigarettes smoked; call [facility] (1-800-QUIT-NOW)  Prevent falls wear appropriate shoes  Meeting treatment goals -    Home Blood Glucose Monitoring 01/23/2013  Check my blood sugar 3 times a day    Care Management & Community Referrals:  Referral 01/23/2013  Referrals made for care management support diabetes educator  Referrals made to community resources -

## 2013-10-27 NOTE — Assessment & Plan Note (Addendum)
Due for TSH recheck  -TSH today and adjust levothyroxine as needed. Currently at 80mcg qd  Addendum 6/3: TSH elevated from prior to 93.15. Patient has been out of synthroid approximately 1 week but reports taking 30mcg daily prior to that. Will increase synthroid to 73mcg and called into new pharmacy. Discussed with patient on phone. Recheck ~6 weeks.

## 2013-10-27 NOTE — Assessment & Plan Note (Addendum)
Improved on last visit to 1.14, baseline ~1 in 2015. Continued to hold lisinopril on last visit to be re-evaluated today.   -repeat bmet, likely will restart lisinopril if continues to trend down or stable given persistent HTN and improved glycemic control  Addendum 6/3: Cr continues to trend down, restarted lisinopril. Will need to follow up renal function on future visits  Recent Labs Lab 10/27/13 0945  CREATININE 1.12*

## 2013-10-27 NOTE — Assessment & Plan Note (Signed)
Lab Results  Component Value Date   HGBA1C 15.5* 09/03/2013   HGBA1C 15.3* 07/20/2013   HGBA1C 13.6* 04/08/2013    Assessment: Diabetes control: poor control (HgbA1C >9%) Progress toward A1C goal:  unable to assess Comments: recheck next month, did not bring meter but says she checks three times a day and always >200. Denies any hypoglycemic episodes.   Plan: Medications:  Increased lantus to 36 units qhs and continue novolog 6 units TID Home glucose monitoring: Frequency:   Timing:   Instruction/counseling given: reminded to bring blood glucose meter & log to each visit and reminded to bring medications to each visit

## 2013-10-28 LAB — TSH: TSH: 93.15 u[IU]/mL — AB (ref 0.350–4.500)

## 2013-10-28 MED ORDER — LEVOTHYROXINE SODIUM 75 MCG PO TABS: 75.0000 ug | ORAL_TABLET | Freq: Every day | ORAL | Status: DC

## 2013-10-28 MED ORDER — PANTOPRAZOLE SODIUM 40 MG PO TBEC: 40.0000 mg | DELAYED_RELEASE_TABLET | Freq: Every day | ORAL | Status: AC

## 2013-10-28 MED ORDER — LISINOPRIL 40 MG PO TABS: 40.0000 mg | ORAL_TABLET | Freq: Every day | ORAL | Status: DC

## 2013-10-28 NOTE — Addendum Note (Signed)
Addended by: Wilber Oliphant on: 10/28/2013 08:40 AM   Modules accepted: Orders

## 2013-10-29 NOTE — Progress Notes (Signed)
Case discussed with Dr. Qureshi soon after the resident saw the patient.  We reviewed the resident's history and exam and pertinent patient test results.  I agree with the assessment, diagnosis, and plan of care documented in the resident's note. 

## 2013-11-02 ENCOUNTER — Ambulatory Visit (INDEPENDENT_AMBULATORY_CARE_PROVIDER_SITE_OTHER): Payer: PRIVATE HEALTH INSURANCE | Admitting: Cardiovascular Disease

## 2013-11-02 ENCOUNTER — Encounter: Payer: Self-pay | Admitting: Cardiovascular Disease

## 2013-11-02 VITALS — BP 144/82 | HR 76 | Ht 62.0 in | Wt 194.4 lb

## 2013-11-02 DIAGNOSIS — IMO0002 Reserved for concepts with insufficient information to code with codable children: Secondary | ICD-10-CM

## 2013-11-02 DIAGNOSIS — E1149 Type 2 diabetes mellitus with other diabetic neurological complication: Secondary | ICD-10-CM

## 2013-11-02 DIAGNOSIS — R079 Chest pain, unspecified: Secondary | ICD-10-CM

## 2013-11-02 DIAGNOSIS — E1165 Type 2 diabetes mellitus with hyperglycemia: Secondary | ICD-10-CM

## 2013-11-02 DIAGNOSIS — I10 Essential (primary) hypertension: Secondary | ICD-10-CM

## 2013-11-02 NOTE — Assessment & Plan Note (Signed)
Well controlled.  Continue current medications and low sodium Dash type diet.    

## 2013-11-02 NOTE — Assessment & Plan Note (Signed)
Atypical but longstanding poorly controlled diabetic now on insulin  Nonspecific ST changes  F/U lexiscan myovue

## 2013-11-02 NOTE — Progress Notes (Signed)
Patient ID: [name], female   DOB: 1955-09-08, 58 y.o.   MRN: [id number]   [name] is a 58 y.o. African American female with a history of HTN, COPD, schatzki ring (EGD 07/2013), hypothyroidism, poorly controlled DM type 2, depression/anxiety, bipolar d/o, and chronic back pain  Seen in the hospital May with atypical chest pain, epigastric pain and DKA.  R/O no arrhythmia on telemetry but never had stress testing  Pain is atypical  Vibration sensation in chest  Can have daily Lasts minutes  Not exertional  No dyspnea palpitations or syncope  BS still poorly controlled Been on insulin about 7 months Likely has GERD and diabetic motility disorder  On protonix but persitant symptoms  She is sedentary and obese.  She has HTN and elevated lipids on RX and followed by [facility]     ROS: Denies fever, malais, weight loss, blurry vision, decreased visual acuity, cough, sputum, SOB, hemoptysis, pleuritic pain, palpitaitons, heartburn, abdominal pain, melena, lower extremity edema, claudication, or rash.  All other systems reviewed and negative   General: Affect appropriate Obese black female  HEENT: normal Neck supple with no adenopathy JVP normal no bruits no thyromegaly Lungs clear with no wheezing and good diaphragmatic motion Heart:  S1/S2 no murmur,rub, gallop or click PMI normal Abdomen: benighn, BS positve, no tenderness, no AAA no bruit.  No HSM or HJR Distal pulses intact with no bruits No edema Neuro non-focal Skin warm and dry No muscular weakness  Medications Current Outpatient Prescriptions  Medication Sig Dispense Refill  . ACCU-CHEK FASTCLIX LANCETS MISC Check blood sugar 4 times a day Dx code- 250.00  102 each  5  . albuterol (PROVENTIL HFA;VENTOLIN HFA) 108 (90 BASE) MCG/ACT inhaler Inhale 2 puffs into the lungs every 6 (six) hours as needed for wheezing.  1 Inhaler  2  . albuterol (PROVENTIL) (2.5 MG/3ML) 0.083% nebulizer solution Inhale 2.5 mg into the lungs  every 6 (six) hours as needed for wheezing.      Marland Kitchen allopurinol (ZYLOPRIM) 300 MG tablet Take 300 mg by mouth daily. For gout.      Marland Kitchen ALPRAZolam (XANAX) 0.5 MG tablet Take 0.5 mg by mouth 3 (three) times daily.      Marland Kitchen amLODipine (NORVASC) 5 MG tablet Take 5 mg by mouth daily. For hypertension.      Marland Kitchen aspirin EC 81 MG tablet Take 81 mg by mouth daily.      Marland Kitchen atorvastatin (LIPITOR) 80 MG tablet Take 80 mg by mouth daily.      . Blood Glucose Monitoring Suppl (ACCU-CHEK NANO SMARTVIEW) W/DEVICE KIT 1 each by Does not apply route 2 (two) times daily.  1 kit  0  . Brinzolamide-Brimonidine (SIMBRINZA) 1-0.2 % SUSP Place 1 drop into both eyes 2 (two) times daily.      . colchicine 0.6 MG tablet Take 1 tablet (0.6 mg total) by mouth daily as needed (for gout flare-ups).      . divalproex (DEPAKOTE) 500 MG DR tablet Take 1,000 mg by mouth at bedtime.       . gabapentin (NEURONTIN) 100 MG capsule Take 300 mg by mouth 2 (two) times daily.      Marland Kitchen gemfibrozil (LOPID) 600 MG tablet Take 600 mg by mouth daily.       Marland Kitchen glucose blood (ACCU-CHEK SMARTVIEW) test strip Check blood sugar 4 times a day Dx code- 250.00  150 each  6  . insulin aspart (NOVOLOG) 100 UNIT/ML injection Inject 6  Units into the skin 3 (three) times daily before meals.  10 mL    . insulin glargine (LANTUS) 100 UNIT/ML injection Inject 0.36 mLs (36 Units total) into the skin at bedtime.  10 mL  1  . Insulin Syringe-Needle U-100 31G X 15/64" 0.3 ML MISC Check blood sugar 4 times a day Dx code- 250.00  200 each  5  . isometheptene-acetaminophen-dichloralphenazone (MIDRIN) 65-325-100 MG capsule Take 1 capsule by mouth 4 (four) times daily as needed for migraine. Maximum 5 capsules in 12 hours for migraine headaches, 8 capsules in 24 hours for tension headaches.  30 capsule  1  . latanoprost (XALATAN) 0.005 % ophthalmic solution Place 1 drop into both eyes 2 (two) times daily.      Marland Kitchen levothyroxine (SYNTHROID, LEVOTHROID) 75 MCG tablet Take 1 tablet  (75 mcg total) by mouth daily before breakfast.  30 tablet  3  . lisinopril (PRINIVIL,ZESTRIL) 40 MG tablet Take 1 tablet (40 mg total) by mouth daily.  30 tablet  3  . mometasone-formoterol (DULERA) 100-5 MCG/ACT AERO Inhale 2 puffs into the lungs 2 (two) times daily.  1 Inhaler    . nitroGLYCERIN (NITROSTAT) 0.4 MG SL tablet Place 1 tablet (0.4 mg total) under the tongue every 5 (five) minutes as needed for chest pain.  30 tablet  3  . ondansetron (ZOFRAN ODT) 8 MG disintegrating tablet Take 1 tablet (8 mg total) by mouth every 8 (eight) hours as needed for nausea or vomiting.  12 tablet  0  . pantoprazole (PROTONIX) 40 MG tablet Take 1 tablet (40 mg total) by mouth daily. For reflux.  30 tablet  11  . sertraline (ZOLOFT) 100 MG tablet Take 1 tablet (100 mg total) by mouth daily.  30 tablet  0  . topiramate (TOPAMAX) 25 MG tablet Take one tablet at night for one week, then take 2 tablets at night for one week, then take 3 tablets at night.      . traMADol (ULTRAM) 50 MG tablet Take 1 tablet (50 mg total) by mouth every 6 (six) hours as needed.  20 tablet  0   No current facility-administered medications for this visit.    Allergies Acetaminophen; Hydrocodone-acetaminophen; and Metformin  Family History: Family History  Problem Relation Age of Onset  . Arthritis Mother   . Diabetes Mother   . Hypertension Mother   . Kidney disease Mother   . Heart disease Mother     died 9  . Heart disease      mother & father  . Hypertension      mother & father  . Colon cancer Neg Hx   . Prostate cancer Neg Hx   . Breast cancer Neg Hx   . Stomach cancer Neg Hx   . Esophageal cancer Neg Hx   . Migraines Neg Hx   . Hypertension Sister   . Hypertension Brother   . Kidney disease Brother     Social History: History   Social History  . Marital Status: Single    Spouse Name: N/A    Number of Children: 3  . Years of Education: college   Occupational History  . Disability    Social  History Main Topics  . Smoking status: Current Every Day Smoker -- 0.02 packs/day for 30 years    Types: Cigarettes  . Smokeless tobacco: Never Used     Comment: smokes 2 -3 cigs daily/uses patches.  . Alcohol Use: No     Comment: denied  alcohol at this time  . Drug Use: Yes    Special: Cocaine     Comment: "a little'  . Sexual Activity: Not on file     Comment: hysterectomy   Other Topics Concern  . Not on file   Social History Narrative   On disability   Single 3 sons   3 coffee/day   3 regular pepsi/day   Updated 08/11/2013                   Electrocardiogram:  SR rate 60  Nonspecific ST T wave change s  Assessment and Plan

## 2013-11-02 NOTE — Assessment & Plan Note (Signed)
Discussed low carb diet.  Target hemoglobin A1c is 6.5 or less.  Continue current medications.  

## 2013-11-02 NOTE — Patient Instructions (Signed)
Your physician recommends that you schedule a follow-up appointment in:  AS NEEDED  Your physician recommends that you continue on your current medications as directed. Please refer to the Current Medication list given to you today.  Your physician has requested that you have a lexiscan myoview. For further information please visit [email or web address]. Please follow instruction sheet, as given.  

## 2013-11-05 ENCOUNTER — Telehealth: Payer: Self-pay | Admitting: Cardiovascular Disease

## 2013-11-05 ENCOUNTER — Encounter: Payer: Self-pay | Admitting: *Deleted

## 2013-11-05 NOTE — Telephone Encounter (Signed)
NOTE  FAXED TO LISTED NUMBER  AT PT'S REQUEST .Adonis Housekeeper

## 2013-11-05 NOTE — Telephone Encounter (Signed)
New message     Pt saw Dr Johnsie Cancel on 11-02-13.  She forgot to bring her medicaid verification form for transportation.  Please fax to [phone] a note saying pt was here on June 8th  If any questions please call patient

## 2013-11-06 ENCOUNTER — Telehealth: Payer: Self-pay | Admitting: *Deleted

## 2013-11-06 NOTE — Telephone Encounter (Signed)
Nevin Bloodgood a NP with [place] [phone]  working with pt - wanted to keep [place] up to date . Pt has 4+ glucose in urine and past week CBG 250 - 500. Stated [facility] did not need to call her back - just wanted to share this information. Hilda Blades Renada Cronin RN 11/06/13 11AM

## 2013-11-12 ENCOUNTER — Ambulatory Visit (HOSPITAL_COMMUNITY): Payer: PRIVATE HEALTH INSURANCE | Attending: Cardiovascular Disease | Admitting: Radiology

## 2013-11-12 ENCOUNTER — Telehealth: Payer: Self-pay | Admitting: *Deleted

## 2013-11-12 VITALS — BP 140/89 | HR 69 | Ht 62.0 in | Wt 194.0 lb

## 2013-11-12 DIAGNOSIS — I10 Essential (primary) hypertension: Secondary | ICD-10-CM | POA: Diagnosis not present

## 2013-11-12 DIAGNOSIS — Z8249 Family history of ischemic heart disease and other diseases of the circulatory system: Secondary | ICD-10-CM | POA: Diagnosis not present

## 2013-11-12 DIAGNOSIS — R42 Dizziness and giddiness: Secondary | ICD-10-CM | POA: Insufficient documentation

## 2013-11-12 DIAGNOSIS — R079 Chest pain, unspecified: Secondary | ICD-10-CM

## 2013-11-12 DIAGNOSIS — Z8673 Personal history of transient ischemic attack (TIA), and cerebral infarction without residual deficits: Secondary | ICD-10-CM | POA: Insufficient documentation

## 2013-11-12 DIAGNOSIS — I209 Angina pectoris, unspecified: Secondary | ICD-10-CM

## 2013-11-12 DIAGNOSIS — R0789 Other chest pain: Secondary | ICD-10-CM | POA: Insufficient documentation

## 2013-11-12 DIAGNOSIS — J449 Chronic obstructive pulmonary disease, unspecified: Secondary | ICD-10-CM | POA: Insufficient documentation

## 2013-11-12 DIAGNOSIS — E119 Type 2 diabetes mellitus without complications: Secondary | ICD-10-CM | POA: Diagnosis not present

## 2013-11-12 DIAGNOSIS — J4489 Other specified chronic obstructive pulmonary disease: Secondary | ICD-10-CM | POA: Insufficient documentation

## 2013-11-12 DIAGNOSIS — R002 Palpitations: Secondary | ICD-10-CM | POA: Diagnosis not present

## 2013-11-12 DIAGNOSIS — R0602 Shortness of breath: Secondary | ICD-10-CM

## 2013-11-12 MED ORDER — TECHNETIUM TC 99M SESTAMIBI GENERIC - CARDIOLITE
10.8000 | Freq: Once | INTRAVENOUS | Status: AC | PRN
  Administered 2013-11-12: 11 via INTRAVENOUS

## 2013-11-12 MED ORDER — REGADENOSON 0.4 MG/5ML IV SOLN
0.4000 mg | Freq: Once | INTRAVENOUS | Status: AC
  Administered 2013-11-12: 0.4 mg via INTRAVENOUS

## 2013-11-12 MED ORDER — TECHNETIUM TC 99M SESTAMIBI GENERIC - CARDIOLITE
33.0000 | Freq: Once | INTRAVENOUS | Status: AC | PRN
  Administered 2013-11-12: 33 via INTRAVENOUS

## 2013-11-12 NOTE — Progress Notes (Signed)
[facility] [address] [place], [place] [address] [phone]    Cardiology Nuclear Med Study  [name] is a 58 y.o. female     MRN : [id number]     DOB: 1955/07/28  Procedure Date: 11/12/2013  Nuclear Med Background Indication for Stress Test:  Evaluation for Ischemia History:  No known CAD, hx. cocaine abuse, Echo 2015 EF 55-60% MPI 2014 EF 74%, Asthma, COPD Cardiac Risk Factors: CVA, Family History - CAD, Hypertension, IDDM, Lipids and Smoker  Symptoms:  Chest Pain (last date of chest discomfort was one week ago), Dizziness and Palpitations   Nuclear Pre-Procedure Caffeine/Decaff Intake:  None>  NPO After: 7:30am   Lungs:  clear O2 Sat: 94% on room air. IV 0.9% NS with Angio Cath:  22g  IV Site: R Antecubital x 1, tolerated well IV Started by:  Irven Baltimore, RN  Chest Size (in):  38 Cup Size: DD  Height: 5\' 2"  (1.575 m)  Weight:  194 lb (87.998 kg)  BMI:  Body mass index is 35.47 kg/(m^2). Tech Comments:  Full dose Lantus Insulin last night. Fasting CBG was 442 at 0600 today with 1/2 dose of Novolog Insulin taken with breakfast at 0730 today.4 hr pc CBG was 400 at 11:45am per patient. Irven Baltimore, RN.    Nuclear Med Study 1 or 2 day study: 1 day  Stress Test Type:  Carlton Adam  Reading MD: N/A  Order Authorizing Provider:  Jenkins Rouge, MD  Resting Radionuclide: Technetium 38m Sestamibi  Resting Radionuclide Dose: 11.0 mCi   Stress Radionuclide:  Technetium 59m Sestamibi  Stress Radionuclide Dose: 33.0 mCi           Stress Protocol Rest HR: 69 Stress HR: 81  Rest BP: 140/89 Stress BP: 136/98  Exercise Time (min): n/a METS: n/a           Dose of Adenosine (mg):  n/a Dose of Lexiscan: 0.4 mg  Dose of Atropine (mg): n/a Dose of Dobutamine: n/a mcg/kg/min (at max HR)  Stress Test Technologist: Glade Lloyd, BS-ES  Nuclear Technologist:  Charlton Amor, CNMT     Rest Procedure:  Myocardial perfusion imaging was performed at  rest 45 minutes following the intravenous administration of Technetium 37m Sestamibi. Rest ECG: Normal sinus rhythm. Normal EKG.  Stress Procedure:  The patient received IV Lexiscan 0.4 mg over 15-seconds.  Technetium 24m Sestamibi injected at 30-seconds.  Quantitative spect images were obtained after a 45 minute delay.  During the infusion of Lexiscan the patient complained of cough, lightheadedness and feet tingling.  Symptoms began to resolve in recovery.  Stress ECG: No significant change from baseline ECG  QPS Raw Data Images:  Normal; no motion artifact; normal heart/lung ratio. Stress Images:  Normal homogeneous uptake in all areas of the myocardium. Rest Images:  Normal homogeneous uptake in all areas of the myocardium. Subtraction (SDS):  No evidence of ischemia. Transient Ischemic Dilatation (Normal <1.22):  0.96 Lung/Heart Ratio (Normal <0.45):  0.25  Quantitative Gated Spect Images QGS EDV:  78 ml QGS ESV:  27 ml  Impression Exercise Capacity:  Lexiscan with no exercise. BP Response:  Normal blood pressure response. Clinical Symptoms:  Cough ECG Impression:  No significant ST segment change suggestive of ischemia. Comparison with Prior Nuclear Study: No images to compare  Overall Impression:  Normal stress nuclear study. This is a low risk scan. There is no scar or ischemia.  LV Ejection Fraction: 65%.  LV Wall Motion:  Normal Wall Motion.  Dola Argyle, MD

## 2013-11-12 NOTE — Telephone Encounter (Signed)
Stated rx for Protonix was not called to her pharmacy. Protonix 40mg  called to [facility].

## 2013-11-17 ENCOUNTER — Telehealth: Payer: Self-pay | Admitting: Cardiovascular Disease

## 2013-11-17 NOTE — Telephone Encounter (Signed)
Pt given results of recent myoview 

## 2013-11-17 NOTE — Telephone Encounter (Signed)
New message ° ° ° ° °Want stress test results °

## 2013-12-01 ENCOUNTER — Telehealth: Payer: Self-pay | Admitting: Internal Medicine

## 2013-12-01 NOTE — Telephone Encounter (Signed)
Seen by Dr. Alphonzo Grieve last week and was asked about filling multiple benzo prescriptions. Patient was using Xanax and Valium due to a misunderstanding. Valium was discontinued and the four pharmacies at which patient had filled prescriptions were all notified by Dr. Ailene Rud practice. Started tapering Xanax. I agreed to not prescribe benzos from my end as her PCP here.

## 2013-12-01 NOTE — Addendum Note (Signed)
Addended by: Hulan Fray on: 12/01/2013 06:37 PM   Modules accepted: Orders

## 2013-12-03 ENCOUNTER — Other Ambulatory Visit: Payer: Self-pay | Admitting: Internal Medicine

## 2013-12-04 ENCOUNTER — Other Ambulatory Visit: Payer: Self-pay | Admitting: *Deleted

## 2013-12-04 ENCOUNTER — Other Ambulatory Visit: Payer: Self-pay

## 2013-12-04 MED ORDER — ALLOPURINOL 300 MG PO TABS: 300.0000 mg | ORAL_TABLET | Freq: Every day | ORAL | Status: DC

## 2013-12-06 ENCOUNTER — Observation Stay (HOSPITAL_COMMUNITY)
Admission: EM | Admit: 2013-12-06 | Discharge: 2013-12-07 | Disposition: A | Discharge status: Home / Self Care | Payer: PRIVATE HEALTH INSURANCE | Attending: Oncology | Admitting: Oncology

## 2013-12-06 ENCOUNTER — Encounter (HOSPITAL_COMMUNITY): Payer: Self-pay | Admitting: Emergency Medicine

## 2013-12-06 DIAGNOSIS — IMO0002 Reserved for concepts with insufficient information to code with codable children: Secondary | ICD-10-CM | POA: Insufficient documentation

## 2013-12-06 DIAGNOSIS — F209 Schizophrenia, unspecified: Secondary | ICD-10-CM | POA: Insufficient documentation

## 2013-12-06 DIAGNOSIS — R51 Headache: Secondary | ICD-10-CM | POA: Diagnosis not present

## 2013-12-06 DIAGNOSIS — Z79899 Other long term (current) drug therapy: Secondary | ICD-10-CM | POA: Diagnosis not present

## 2013-12-06 DIAGNOSIS — M1990 Unspecified osteoarthritis, unspecified site: Secondary | ICD-10-CM | POA: Insufficient documentation

## 2013-12-06 DIAGNOSIS — E1100 Type 2 diabetes mellitus with hyperosmolarity without nonketotic hyperglycemic-hyperosmolar coma (NKHHC): Secondary | ICD-10-CM | POA: Diagnosis present

## 2013-12-06 DIAGNOSIS — F1910 Other psychoactive substance abuse, uncomplicated: Secondary | ICD-10-CM

## 2013-12-06 DIAGNOSIS — K2990 Gastroduodenitis, unspecified, without bleeding: Secondary | ICD-10-CM

## 2013-12-06 DIAGNOSIS — N183 Chronic kidney disease, stage 3 unspecified: Secondary | ICD-10-CM | POA: Diagnosis not present

## 2013-12-06 DIAGNOSIS — Z7982 Long term (current) use of aspirin: Secondary | ICD-10-CM | POA: Insufficient documentation

## 2013-12-06 DIAGNOSIS — F17200 Nicotine dependence, unspecified, uncomplicated: Secondary | ICD-10-CM | POA: Insufficient documentation

## 2013-12-06 DIAGNOSIS — Z9119 Patient's noncompliance with other medical treatment and regimen: Secondary | ICD-10-CM

## 2013-12-06 DIAGNOSIS — B3731 Acute candidiasis of vulva and vagina: Secondary | ICD-10-CM

## 2013-12-06 DIAGNOSIS — Z8673 Personal history of transient ischemic attack (TIA), and cerebral infarction without residual deficits: Secondary | ICD-10-CM

## 2013-12-06 DIAGNOSIS — M109 Gout, unspecified: Secondary | ICD-10-CM | POA: Diagnosis not present

## 2013-12-06 DIAGNOSIS — K2970 Gastritis, unspecified, without bleeding: Secondary | ICD-10-CM | POA: Diagnosis not present

## 2013-12-06 DIAGNOSIS — F319 Bipolar disorder, unspecified: Secondary | ICD-10-CM

## 2013-12-06 DIAGNOSIS — R739 Hyperglycemia, unspecified: Secondary | ICD-10-CM | POA: Diagnosis present

## 2013-12-06 DIAGNOSIS — E039 Hypothyroidism, unspecified: Secondary | ICD-10-CM | POA: Diagnosis not present

## 2013-12-06 DIAGNOSIS — G43909 Migraine, unspecified, not intractable, without status migrainosus: Secondary | ICD-10-CM

## 2013-12-06 DIAGNOSIS — M1710 Unilateral primary osteoarthritis, unspecified knee: Secondary | ICD-10-CM | POA: Diagnosis not present

## 2013-12-06 DIAGNOSIS — Z794 Long term (current) use of insulin: Secondary | ICD-10-CM | POA: Diagnosis not present

## 2013-12-06 DIAGNOSIS — Z91199 Patient's noncompliance with other medical treatment and regimen due to unspecified reason: Secondary | ICD-10-CM

## 2013-12-06 DIAGNOSIS — G8929 Other chronic pain: Secondary | ICD-10-CM | POA: Diagnosis not present

## 2013-12-06 DIAGNOSIS — K7689 Other specified diseases of liver: Secondary | ICD-10-CM | POA: Insufficient documentation

## 2013-12-06 DIAGNOSIS — K859 Acute pancreatitis without necrosis or infection, unspecified: Secondary | ICD-10-CM | POA: Insufficient documentation

## 2013-12-06 DIAGNOSIS — H409 Unspecified glaucoma: Secondary | ICD-10-CM | POA: Diagnosis not present

## 2013-12-06 DIAGNOSIS — J4489 Other specified chronic obstructive pulmonary disease: Secondary | ICD-10-CM

## 2013-12-06 DIAGNOSIS — E785 Hyperlipidemia, unspecified: Secondary | ICD-10-CM | POA: Diagnosis not present

## 2013-12-06 DIAGNOSIS — I129 Hypertensive chronic kidney disease with stage 1 through stage 4 chronic kidney disease, or unspecified chronic kidney disease: Secondary | ICD-10-CM | POA: Insufficient documentation

## 2013-12-06 DIAGNOSIS — J449 Chronic obstructive pulmonary disease, unspecified: Secondary | ICD-10-CM | POA: Insufficient documentation

## 2013-12-06 DIAGNOSIS — F341 Dysthymic disorder: Secondary | ICD-10-CM

## 2013-12-06 DIAGNOSIS — B373 Candidiasis of vulva and vagina: Secondary | ICD-10-CM

## 2013-12-06 DIAGNOSIS — E1101 Type 2 diabetes mellitus with hyperosmolarity with coma: Secondary | ICD-10-CM

## 2013-12-06 DIAGNOSIS — E119 Type 2 diabetes mellitus without complications: Principal | ICD-10-CM | POA: Insufficient documentation

## 2013-12-06 LAB — COMPREHENSIVE METABOLIC PANEL
ALK PHOS: 162 U/L — AB (ref 39–117)
ALT: 17 U/L (ref 0–35)
AST: 19 U/L (ref 0–37)
Albumin: 3.6 g/dL (ref 3.5–5.2)
Anion gap: 18 — ABNORMAL HIGH (ref 5–15)
BILIRUBIN TOTAL: 0.2 mg/dL — AB (ref 0.3–1.2)
BUN: 22 mg/dL (ref 6–23)
CHLORIDE: 89 meq/L — AB (ref 96–112)
CO2: 23 meq/L (ref 19–32)
Calcium: 9.5 mg/dL (ref 8.4–10.5)
Creatinine, Ser: 1.08 mg/dL (ref 0.50–1.10)
GFR calc non Af Amer: 57 mL/min — ABNORMAL LOW (ref >90)
GFR, EST AFRICAN AMERICAN: 66 mL/min — AB (ref >90)
GLUCOSE: 619 mg/dL — AB (ref 70–99)
POTASSIUM: 4.5 meq/L (ref 3.7–5.3)
SODIUM: 130 meq/L — AB (ref 137–147)
TOTAL PROTEIN: 7.5 g/dL (ref 6.0–8.3)

## 2013-12-06 LAB — CBC WITH DIFFERENTIAL/PLATELET
BASOS PCT: 0 % (ref 0–1)
Basophils Absolute: 0 10*3/uL (ref 0.0–0.1)
Eosinophils Absolute: 0.2 10*3/uL (ref 0.0–0.7)
Eosinophils Relative: 3 % (ref 0–5)
HCT: 39.2 % (ref 36.0–46.0)
Hemoglobin: 13.3 g/dL (ref 12.0–15.0)
LYMPHS PCT: 35 % (ref 12–46)
Lymphs Abs: 2.4 10*3/uL (ref 0.7–4.0)
MCH: 29.6 pg (ref 26.0–34.0)
MCHC: 33.9 g/dL (ref 30.0–36.0)
MCV: 87.3 fL (ref 78.0–100.0)
MONO ABS: 0.4 10*3/uL (ref 0.1–1.0)
MONOS PCT: 6 % (ref 3–12)
NEUTROS PCT: 56 % (ref 43–77)
Neutro Abs: 3.9 10*3/uL (ref 1.7–7.7)
PLATELETS: ADEQUATE 10*3/uL (ref 150–400)
RBC: 4.49 MIL/uL (ref 3.87–5.11)
RDW: 15.1 % (ref 11.5–15.5)
WBC: 6.9 10*3/uL (ref 4.0–10.5)

## 2013-12-06 LAB — URINALYSIS, ROUTINE W REFLEX MICROSCOPIC
Bilirubin Urine: NEGATIVE
Glucose, UA: >1000 mg/dL — AB
HGB URINE DIPSTICK: NEGATIVE
Ketones, ur: NEGATIVE mg/dL
Leukocytes, UA: NEGATIVE
NITRITE: NEGATIVE
PROTEIN: NEGATIVE mg/dL
Specific Gravity, Urine: 1.029 (ref 1.005–1.030)
Urobilinogen, UA: 0.2 mg/dL (ref 0.0–1.0)
pH: 6.5 (ref 5.0–8.0)

## 2013-12-06 LAB — URINE MICROSCOPIC-ADD ON

## 2013-12-06 LAB — CBG MONITORING, ED
GLUCOSE-CAPILLARY: 450 mg/dL — AB (ref 70–99)
Glucose-Capillary: 437 mg/dL — ABNORMAL HIGH (ref 70–99)
Glucose-Capillary: 574 mg/dL (ref 70–99)

## 2013-12-06 LAB — VALPROIC ACID LEVEL: VALPROIC ACID LVL: 45.1 ug/mL — AB (ref 50.0–100.0)

## 2013-12-06 MED ORDER — INSULIN ASPART 100 UNIT/ML ~~LOC~~ SOLN
10.0000 [IU] | Freq: Once | SUBCUTANEOUS | Status: AC
  Administered 2013-12-06: 10 [IU] via SUBCUTANEOUS
  Filled 2013-12-06: qty 1

## 2013-12-06 MED ORDER — INSULIN REGULAR HUMAN 100 UNIT/ML IJ SOLN
INTRAMUSCULAR | Status: DC
  Administered 2013-12-07: 3.8 [IU]/h via INTRAVENOUS
  Filled 2013-12-06: qty 1

## 2013-12-06 MED ORDER — SODIUM CHLORIDE 0.9 % IV BOLUS (SEPSIS)
2000.0000 mL | Freq: Once | INTRAVENOUS | Status: AC
  Administered 2013-12-06: 2000 mL via INTRAVENOUS

## 2013-12-06 MED ORDER — MORPHINE SULFATE 4 MG/ML IJ SOLN
4.0000 mg | Freq: Once | INTRAMUSCULAR | Status: AC
  Administered 2013-12-06: 4 mg via INTRAVENOUS
  Filled 2013-12-06: qty 1

## 2013-12-06 MED ORDER — DEXTROSE-NACL 5-0.45 % IV SOLN: INTRAVENOUS | Status: DC

## 2013-12-06 NOTE — ED Notes (Signed)
Per EMS, pt has hx of diabetes with a CBG of 524. Pt reports her average CBG is 250-300. Pt reports headache and bilateral knee pain when getting up. NAD at this time.

## 2013-12-06 NOTE — H&P (Signed)
Date: 12/07/2013               Patient Name:  [name] MRN: [id number]  DOB: [birth year] Age / Sex: 58 y.o., female   PCP: Charlott Rakes, MD         Medical Service: Internal Medicine Teaching Service         Attending Physician: Dr. Annia Belt, MD    First Contact: Dr. Ethelene Hal Pager: [phone]  Second Contact: Dr. Algis Liming Pager: [phone]       After Hours (After 5p/  First Contact Pager: [phone]  weekends / holidays): Second Contact Pager: [phone]   Chief Complaint: high blood sugar  History of Present Illness: [name] is a 58 y.o. Female with PMHx of Type II DM, COPD, CKD stage 3, hyperthyroidism, TIA x 2 in 2012, bipolar disorder, and substance abuse who presents with complaint of high blood sugar. Pt states that this morning her blood sugar was in the 400s. Tonight after dinner she felt dizzy and checked her blood sugar and it was over 600. She called EMS who brought her to the ED. Pt states her normal glucose is between 250 and 300. She admits to missing one of her doses of novolog recently, but states she has been very good about taking her scheduled insulin-- 6 units of Novolog at meal time and 34 units of Lantus at bedtime. She states her diet has been good--she has been measuring and limiting her carb intake. She admits to you using cocaine about a week ago, but denies any alcohol use. She does state that she has had a productive cough with green mucus for about one week. She admits to nausea and one episode of vomiting, but denies fever, chills or abdominal pain.  Meds: Current Facility-Administered Medications  Medication Dose Route Frequency Provider Last Rate Last Dose  . 0.9 %  sodium chloride infusion   Intravenous Continuous Corky Sox, MD      . albuterol (PROVENTIL HFA;VENTOLIN HFA) 108 (90 BASE) MCG/ACT inhaler 2 puff  2 puff Inhalation Q6H PRN Corky Sox, MD      . allopurinol (ZYLOPRIM) tablet 300 mg  300 mg Oral Daily Corky Sox, MD      . ALPRAZolam  Duanne Moron) tablet 0.5 mg  0.5 mg Oral TID Corky Sox, MD      . amLODipine (NORVASC) tablet 5 mg  5 mg Oral Daily Corky Sox, MD      . aspirin EC tablet 81 mg  81 mg Oral Daily Corky Sox, MD      . atorvastatin (LIPITOR) tablet 80 mg  80 mg Oral Daily Corky Sox, MD      . dextrose 5 %-0.45 % sodium chloride infusion   Intravenous Continuous Corky Sox, MD      . dextrose 50 % solution 25 mL  25 mL Intravenous PRN Corky Sox, MD      . divalproex (DEPAKOTE) DR tablet 1,000 mg  1,000 mg Oral QHS Corky Sox, MD      . enoxaparin (LOVENOX) injection 40 mg  40 mg Subcutaneous Q24H Corky Sox, MD      . fluconazole (DIFLUCAN) tablet 150 mg  150 mg Oral Once Corky Sox, MD      . gabapentin (NEURONTIN) capsule 300 mg  300 mg Oral BID Corky Sox, MD      . insulin regular (NOVOLIN R,HUMULIN R) 1 Units/mL in sodium  chloride 0.9 % 100 mL infusion   Intravenous Continuous Corky Sox, MD      . latanoprost (XALATAN) 0.005 % ophthalmic solution 1 drop  1 drop Both Eyes BID Corky Sox, MD      . levothyroxine (SYNTHROID, LEVOTHROID) tablet 75 mcg  75 mcg Oral QAC breakfast Corky Sox, MD      . lisinopril (PRINIVIL,ZESTRIL) tablet 40 mg  40 mg Oral Daily Corky Sox, MD      . mometasone-formoterol [facility]) 100-5 MCG/ACT inhaler 2 puff  2 puff Inhalation BID Corky Sox, MD      . ondansetron [facility]) injection 4 mg  4 mg Intravenous Q8H PRN Corky Sox, MD      . pantoprazole (PROTONIX) EC tablet 40 mg  40 mg Oral Daily Corky Sox, MD      . potassium chloride 10 mEq in 100 mL IVPB  10 mEq Intravenous Q1H Corky Sox, MD      . sertraline (ZOLOFT) tablet 100 mg  100 mg Oral Daily Corky Sox, MD      . topiramate (TOPAMAX) tablet 25 mg  25 mg Oral BID PRN Corky Sox, MD       Current Outpatient Prescriptions  Medication Sig Dispense Refill  . albuterol (PROVENTIL HFA;VENTOLIN HFA) 108 (90 BASE) MCG/ACT inhaler Inhale 2 puffs into the lungs every 6 (six) hours as needed  for wheezing.  1 Inhaler  2  . albuterol (PROVENTIL) (2.5 MG/3ML) 0.083% nebulizer solution Inhale 2.5 mg into the lungs every 6 (six) hours as needed for wheezing.      Marland Kitchen allopurinol (ZYLOPRIM) 300 MG tablet Take 1 tablet (300 mg total) by mouth daily. For gout.  30 tablet  3  . ALPRAZolam (XANAX) 0.5 MG tablet Take 0.5 mg by mouth 3 (three) times daily.      Marland Kitchen amLODipine (NORVASC) 5 MG tablet Take 5 mg by mouth daily. For hypertension.      Marland Kitchen aspirin EC 81 MG tablet Take 81 mg by mouth daily.      Marland Kitchen atorvastatin (LIPITOR) 80 MG tablet Take 80 mg by mouth daily.      . Brinzolamide-Brimonidine (SIMBRINZA) 1-0.2 % SUSP Place 1 drop into both eyes 2 (two) times daily.      . colchicine 0.6 MG tablet Take 1 tablet (0.6 mg total) by mouth daily as needed (for gout flare-ups).      . divalproex (DEPAKOTE) 500 MG DR tablet Take 1,000 mg by mouth at bedtime.       . gabapentin (NEURONTIN) 100 MG capsule Take 300 mg by mouth 2 (two) times daily.      Marland Kitchen gemfibrozil (LOPID) 600 MG tablet Take 600 mg by mouth daily.       . insulin aspart (NOVOLOG) 100 UNIT/ML injection Inject 6 Units into the skin 3 (three) times daily before meals.  10 mL    . insulin glargine (LANTUS) 100 UNIT/ML injection Inject 0.36 mLs (36 Units total) into the skin at bedtime.  10 mL  1  . isometheptene-acetaminophen-dichloralphenazone (MIDRIN) 65-325-100 MG capsule Take 1 capsule by mouth 4 (four) times daily as needed for migraine. Maximum 5 capsules in 12 hours for migraine headaches, 8 capsules in 24 hours for tension headaches.  30 capsule  1  . latanoprost (XALATAN) 0.005 % ophthalmic solution Place 1 drop into both eyes 2 (two) times daily.      Marland Kitchen levothyroxine (SYNTHROID, LEVOTHROID) 75 MCG tablet  Take 1 tablet (75 mcg total) by mouth daily before breakfast.  30 tablet  3  . lisinopril (PRINIVIL,ZESTRIL) 40 MG tablet Take 1 tablet (40 mg total) by mouth daily.  30 tablet  3  . mometasone-formoterol (DULERA) 100-5 MCG/ACT AERO  Inhale 2 puffs into the lungs 2 (two) times daily.  1 Inhaler    . nitroGLYCERIN (NITROSTAT) 0.4 MG SL tablet Place 1 tablet (0.4 mg total) under the tongue every 5 (five) minutes as needed for chest pain.  30 tablet  3  . ondansetron (ZOFRAN ODT) 8 MG disintegrating tablet Take 1 tablet (8 mg total) by mouth every 8 (eight) hours as needed for nausea or vomiting.  12 tablet  0  . oxyCODONE (ROXICODONE) 15 MG immediate release tablet Take 15 mg by mouth 3 (three) times daily.      . pantoprazole (PROTONIX) 40 MG tablet Take 1 tablet (40 mg total) by mouth daily. For reflux.  30 tablet  11  . sertraline (ZOLOFT) 100 MG tablet Take 1 tablet (100 mg total) by mouth daily.  30 tablet  0  . topiramate (TOPAMAX) 25 MG tablet Take 25 mg by mouth 2 (two) times daily as needed (migraine).       . traMADol (ULTRAM) 50 MG tablet Take 50 mg by mouth every 6 (six) hours as needed for moderate pain.        Allergies: Allergies as of 12/06/2013 - Review Complete 12/06/2013  Allergen Reaction Noted  . Acetaminophen Nausea And Vomiting 09/02/2013  . Hydrocodone-acetaminophen Hives 01/02/2013  . Metformin Diarrhea 01/02/2013   Past Medical History  Diagnosis Date  . Asthma     past 10 years  . Diabetes mellitus     past 5 years  . Glaucoma     dx 06/2011-[facility]  . Hypertension   . Hyperlipidemia   . Hyperthyroidism   . History of TIA (transient ischemic attack)     01/06/2011, x2  . Pancreatitis   . Gout   . Depression   . Renal disorder     chronic kidney disease, stage 3  . Chronic pain   . Glaucoma   . Arthritis     djd -lower back, left knee  . Degenerative disc disease   . Bipolar disorder   . Gastritis   . COPD (chronic obstructive pulmonary disease)   . Schizophrenia   . 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 Fatty liver     mild CT 06/2012  . DKA (diabetic ketoacidoses) 07/21/2013  . Stroke     TIAs  . Esophageal ring, acquired 08/12/2013  . Cocaine abuse    . Chronic abdominal pain    Past Surgical History  Procedure Laterality Date  . Abdominal hysterectomy  1990    still has ovaries   . Colonoscopy    . Refractive surgery      both eyes   Family History  Problem Relation Age of Onset  . Arthritis Mother   . Diabetes Mother   . Hypertension Mother   . Kidney disease Mother   . Heart disease Mother     died 50  . Heart disease      mother & father  . Hypertension      mother & father  . Colon cancer Neg Hx   . Prostate cancer Neg Hx   . Breast cancer Neg Hx   . Stomach cancer Neg Hx   . Esophageal cancer Neg Hx   .  Migraines Neg Hx   . Hypertension Sister   . Hypertension Brother   . Kidney disease Brother    History   Social History  . Marital Status: Single    Spouse Name: N/A    Number of Children: 3  . Years of Education: college   Occupational History  . Disability    Social History Main Topics  . Smoking status: Current Every Day Smoker -- 0.02 packs/day for 30 years    Types: Cigarettes  . Smokeless tobacco: Never Used     Comment: smokes 2 -3 cigs daily/uses patches.  . Alcohol Use: No     Comment: denied alcohol at this time  . Drug Use: Yes    Special: Cocaine     Comment: "a little'  . Sexual Activity: Not on file     Comment: hysterectomy   Other Topics Concern  . Not on file   Social History Narrative   On disability   Single 3 sons   3 coffee/day   3 regular pepsi/day   Updated 08/11/2013                   Review of Systems: General: Denies fever, chills, diaphoresis, appetite change and fatigue.  Respiratory: Admits to productive cough. Denies SOB, DOE, chest tightness, and wheezing.   Cardiovascular: Denies chest pain and palpitations.  Gastrointestinal: Admits to nausea and one episode of vomiting, but denies abdominal pain, diarrhea, constipation, blood in stool and abdominal distention.  Genitourinary: Admits to dysuria and itching. Admits to increased frequency. Denies  urgency, hematuria, and flank pain. Endocrine: Denies hot or cold intolerance, Admits to polyuria, and polydipsia. Musculoskeletal: Positive leg pain bilaterally. Denies myalgias, back pain, joint swelling, arthralgias and gait problem.  Skin: Denies pallor, rash and wounds.  Neurological: Denies dizziness, seizures, syncope, weakness, lightheadedness, numbness and headaches.  Psychiatric/Behavioral: Denies mood changes, confusion, nervousness, sleep disturbance and agitation.  Physical Exam: Filed Vitals:   12/06/13 2135 12/06/13 2200 12/06/13 2337 12/06/13 2350  BP:  138/77 158/90   Pulse: 66 72 70   Temp:      TempSrc:      Resp: 21     Height:    5\' 2"  (1.575 m)  Weight:    205 lb (92.987 kg)  SpO2: 97% 94% 97%    General: Vital signs reviewed.  Patient is a well-developed and well-nourished, in no acute distress and cooperative with exam.  Head: Normocephalic and atraumatic. Moist mucous membranes. Eyes: PERRL, EOMI, conjunctivae normal, No scleral icterus.  Neck: Supple, trachea midline, normal ROM, No JVD, masses. Cardiovascular: RRR, S1 normal, S2 normal, no murmurs, gallops, or rubs. Pulmonary/Chest: CTA b/l, no wheezes, rales, or rhonchi. Abdominal: Soft, mild suprapubic tenderness, non-distended, BS +, no masses, organomegaly, or guarding present.  Musculoskeletal: No joint deformities, erythema, or stiffness, ROM full and nontender. Extremities: Negative lower extremity edema bilaterally, pulses symmetric and intact bilaterally. No cyanosis or clubbing. Neurological: A&O x3, Strength is normal and symmetric bilaterally, cranial nerve II-XII are grossly intact, no focal motor deficit, sensory intact to light touch bilaterally.  Skin: Warm, dry and intact. Mucous membranes moist. No skin tenting. No rashes or erythema. Psychiatric: Normal mood and affect. speech and behavior is normal. Cognition and memory are normal.   Lab results: Basic Metabolic Panel:  Recent Labs   12/06/13 2118  NA 130*  K 4.5  CL 89*  CO2 23  GLUCOSE 619*  BUN 22  CREATININE 1.08  CALCIUM 9.5  Liver Function Tests:  Recent Labs  12/06/13 2118  AST 19  ALT 17  ALKPHOS 162*  BILITOT 0.2*  PROT 7.5  ALBUMIN 3.6   CBC:  Recent Labs  12/06/13 2118  WBC 6.9  NEUTROABS 3.9  HGB 13.3  HCT 39.2  MCV 87.3  PLT PLATELET CLUMPS NOTED ON SMEAR, COUNT APPEARS ADEQUATE   CBG:  Recent Labs  12/06/13 2117 12/06/13 2256 12/06/13 2349  GLUCAP 574* 450* 437*   Urinalysis:  Recent Labs  12/06/13 2110  COLORURINE YELLOW  LABSPEC 1.029  PHURINE 6.5  GLUCOSEU >1000*  HGBUR NEGATIVE  BILIRUBINUR NEGATIVE  KETONESUR NEGATIVE  PROTEINUR NEGATIVE  UROBILINOGEN 0.2  NITRITE NEGATIVE  LEUKOCYTESUR NEGATIVE   Assessment & Plan by Problem:  Hyperosmolar Hyperglycemic State: Pt has known history of Type II DM with neuropathy and history of hyperglycemic events from medical non-compliance. Pt presents to the ED with glucose of 619. Na 130, K 4.5, Cl 89, bicarb 23, and an anion gap of 18. No urine ketones. WBC 6.9. Hyperglycemia could be 2/2 medical non-compliance, pt admits that she missed a dose of her novolog, or it could be due to cocaine use or a viral upper respiratory infection. Last hemoglobin A1C was 15.5 from April 2015. Pt takes Novolog 6 units TID before meals and Lantus 36 units at bedtime. Pt was given Novolog 10 units, morphine 4 mg and a 2 L bolus of NS. -Started on regular insulin drip -D5-1/2 NS at 100 mL/hr -KCl 10 mEq x 2 doses -Cardiac monitor -EKG -NPO -I/Os -Bedrest -Hemoglobin A1c  Vaginal Candidiasis: Pt complains of dysuria and itching. Pt has recent history of vaginal candidiasis and with her hyperglycemia, it is very likely that she has recurrent infection. UA shows negative leukocytes and negative nitrites with rare yeast. -Fluconazole 150 mg once  COPD: Pt pulse ox is 97% on room air. No complaint of shortness of breath. Takes albuterol  2 puffs Q6H prn wheezing and dulera 2 puffs BID.  -Continue albuterol 2 puffs Q6H prn wheezing and dulera 2 puffs BID  CKD stage 3: Cr 1.08 and GFR 66. Pt is on lisinopril 40 mg daily at home.  -Continue lisinopril 40 mg  Hypothyroidism: Most recent TSH 93.15 on 10/27/2013. Most recent free T4 0.36 on 07/21/13. On synthroid 75 mcg daily at home.  -Stable -Continue synthroid 75 mcg daily   TIA x 2 in 2012: No complaints of weakness or neurologic deficits. No focal deficits on exam. On amlodipine 5 mg daily, aspirin 81 mg daily, atorvastatin 80 mg daily, and lisinopril 40 mg daily at home.  -Continue amlodipine 5 mg daily, aspirin 81 mg daily, atorvastatin 80 mg daily, and lisinopril 40 mg daily   Bipolar disorder with depression and anxiety: On xanax 0.5 mg TID daily, depakote 500 mg daily, and sertraline 100 mg daily. Valproic acid level 45.1.  -Continue xanax 0.5 mg TID daily, depakote 500 mg daily, and sertraline 100 mg daily -Stable  Migraine Headaches: Denies headache. Topiramate 25 mg BID and Midrin prn migraine.  -Continue Topiramate 25 mg BID   Substance abuse: Pt admits to cocaine use. Last use was approximately 1 week ago. She denies any alcohol use. -Urine Drug Screen  Gout: Last gout flare was less than 1 year ago. On allopurinol 300 mg daily and colchicine 0.6 mg daily prn gout flare. -Stable -Continue allopurinol 300 mg daily and colchicine 0.6 mg daily prn gout flare  DVT/PE ppx: Lovenox 40 mg SQ daily  Dispo:  Disposition is deferred at this time, awaiting improvement of current medical problems. Anticipated discharge in approximately 1-2 day(s).   The patient does have a current PCP (Charlott Rakes, MD) and does not need an [facility] hospital follow-up appointment after discharge.  The patient does not have transportation limitations that hinder transportation to clinic appointments.  Signed: Osa Craver, DO PGY-1 Internal Medicine Resident Pager # [phone] 12/07/2013  12:58 AM

## 2013-12-06 NOTE — ED Provider Notes (Addendum)
CSN: [id number]     Arrival date & time 12/06/13  2051 History   First MD Initiated Contact with Patient 12/06/13 2105     Chief Complaint  Patient presents with  . Hyperglycemia     (Consider location/radiation/quality/duration/timing/severity/associated sxs/prior Treatment) The history is provided by the patient.  [name] is a 58 y.o. female hx of DM, asthma, HTN, HL here with hyperglycemia. Has been recently put on insulin with poor control. Blood sugar usually runs in 200-300. Today, went up to 500. Has been drinking fruit smoothes. One episode of vomiting. No abdominal pain. Has chronic knee pain but denies recent injury. Has some headaches as well but not sudden onset and no numbness or weakness.    Past Medical History  Diagnosis Date  . Asthma     past 10 years  . Diabetes mellitus     past 5 years  . Glaucoma     dx 06/2011-[facility]  . Hypertension   . Hyperlipidemia   . Hyperthyroidism   . History of TIA (transient ischemic attack)     01/06/2011, x2  . Pancreatitis   . Gout   . Depression   . Renal disorder     chronic kidney disease, stage 3  . Chronic pain   . Glaucoma   . Arthritis     djd -lower back, left knee  . Degenerative disc disease   . Bipolar disorder   . Gastritis   . COPD (chronic obstructive pulmonary disease)   . Schizophrenia   . 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 Fatty liver     mild CT 06/2012  . DKA (diabetic ketoacidoses) 07/21/2013  . Stroke     TIAs  . Esophageal ring, acquired 08/12/2013  . Cocaine abuse   . Chronic abdominal pain    Past Surgical History  Procedure Laterality Date  . Abdominal hysterectomy  1990    still has ovaries   . Colonoscopy    . Refractive surgery      both eyes   Family History  Problem Relation Age of Onset  . Arthritis Mother   . Diabetes Mother   . Hypertension Mother   . Kidney disease Mother   . Heart disease Mother     died 18  . Heart disease     mother & father  . Hypertension      mother & father  . Colon cancer Neg Hx   . Prostate cancer Neg Hx   . Breast cancer Neg Hx   . Stomach cancer Neg Hx   . Esophageal cancer Neg Hx   . Migraines Neg Hx   . Hypertension Sister   . Hypertension Brother   . Kidney disease Brother    History  Substance Use Topics  . Smoking status: Current Every Day Smoker -- 0.02 packs/day for 30 years    Types: Cigarettes  . Smokeless tobacco: Never Used     Comment: smokes 2 -3 cigs daily/uses patches.  . Alcohol Use: No     Comment: denied alcohol at this time   OB History   Grav Para Term Preterm Abortions TAB SAB Ect Mult Living                 Review of Systems  Musculoskeletal:       Knee pain   Neurological: Positive for headaches.  All other systems reviewed and are negative.     Allergies  Acetaminophen; Hydrocodone-acetaminophen; and Metformin  Home Medications   Prior to Admission medications   Medication Sig Start Date End Date Taking? Authorizing Provider  albuterol (PROVENTIL HFA;VENTOLIN HFA) 108 (90 BASE) MCG/ACT inhaler Inhale 2 puffs into the lungs every 6 (six) hours as needed for wheezing. 01/23/13  Yes Jessee Avers, MD  albuterol (PROVENTIL) (2.5 MG/3ML) 0.083% nebulizer solution Inhale 2.5 mg into the lungs every 6 (six) hours as needed for wheezing. 07/22/13  Yes Otho Bellows, MD  allopurinol (ZYLOPRIM) 300 MG tablet Take 1 tablet (300 mg total) by mouth daily. For gout. 12/04/13  Yes Sid Falcon, MD  ALPRAZolam Duanne Moron) 0.5 MG tablet Take 0.5 mg by mouth 3 (three) times daily.   Yes Historical Provider, MD  amLODipine (NORVASC) 5 MG tablet Take 5 mg by mouth daily. For hypertension. 07/22/13  Yes Otho Bellows, MD  aspirin EC 81 MG tablet Take 81 mg by mouth daily.   Yes Historical Provider, MD  atorvastatin (LIPITOR) 80 MG tablet Take 80 mg by mouth daily.   Yes Historical Provider, MD  Brinzolamide-Brimonidine [facility]) 1-0.2 % SUSP Place 1 drop into  both eyes 2 (two) times daily.   Yes Historical Provider, MD  colchicine 0.6 MG tablet Take 1 tablet (0.6 mg total) by mouth daily as needed (for gout flare-ups). 04/08/13  Yes Nena Polio, PA-C  divalproex (DEPAKOTE) 500 MG DR tablet Take 1,000 mg by mouth at bedtime.    Yes Historical Provider, MD  gabapentin (NEURONTIN) 100 MG capsule Take 300 mg by mouth 2 (two) times daily.   Yes Historical Provider, MD  gemfibrozil (LOPID) 600 MG tablet Take 600 mg by mouth daily.    Yes Historical Provider, MD  insulin aspart (NOVOLOG) 100 UNIT/ML injection Inject 6 Units into the skin 3 (three) times daily before meals. 10/12/13  Yes Rebecca Eaton, MD  insulin glargine (LANTUS) 100 UNIT/ML injection Inject 0.36 mLs (36 Units total) into the skin at bedtime. 10/27/13  Yes Jerene Pitch, MD  isometheptene-acetaminophen-dichloralphenazone (MIDRIN) 65-325-100 MG capsule Take 1 capsule by mouth 4 (four) times daily as needed for migraine. Maximum 5 capsules in 12 hours for migraine headaches, 8 capsules in 24 hours for tension headaches. 09/22/13  Yes Kathrynn Ducking, MD  latanoprost (XALATAN) 0.005 % ophthalmic solution Place 1 drop into both eyes 2 (two) times daily. 04/13/13  Yes Nena Polio, PA-C  levothyroxine (SYNTHROID, LEVOTHROID) 75 MCG tablet Take 1 tablet (75 mcg total) by mouth daily before breakfast. 10/28/13  Yes Jerene Pitch, MD  lisinopril (PRINIVIL,ZESTRIL) 40 MG tablet Take 1 tablet (40 mg total) by mouth daily. 10/28/13  Yes Jerene Pitch, MD  mometasone-formoterol (DULERA) 100-5 MCG/ACT AERO Inhale 2 puffs into the lungs 2 (two) times daily. 04/13/13  Yes Nena Polio, PA-C  nitroGLYCERIN (NITROSTAT) 0.4 MG SL tablet Place 1 tablet (0.4 mg total) under the tongue every 5 (five) minutes as needed for chest pain. 11/09/12  Yes Ivor Costa, MD  ondansetron (ZOFRAN ODT) 8 MG disintegrating tablet Take 1 tablet (8 mg total) by mouth every 8 (eight) hours as needed for nausea or vomiting. 08/31/13  Yes  Hoy Morn, MD  oxyCODONE (ROXICODONE) 15 MG immediate release tablet Take 15 mg by mouth 3 (three) times daily.   Yes Historical Provider, MD  pantoprazole (PROTONIX) 40 MG tablet Take 1 tablet (40 mg total) by mouth daily. For reflux. 10/28/13  Yes Jerene Pitch, MD  sertraline (ZOLOFT) 100 MG tablet Take 1 tablet (100 mg total) by mouth daily. 08/18/13  Yes  Clinton Gallant, MD  topiramate (TOPAMAX) 25 MG tablet Take 25 mg by mouth 2 (two) times daily as needed (migraine).  09/22/13  Yes Kathrynn Ducking, MD  traMADol (ULTRAM) 50 MG tablet Take 50 mg by mouth every 6 (six) hours as needed for moderate pain.   Yes Historical Provider, MD   BP 138/77  Pulse 72  Temp(Src) 97.9 F (36.6 C) (Oral)  Resp 21  SpO2 94% Physical Exam  Nursing note and vitals reviewed. Constitutional: She is oriented to person, place, and time. She appears well-developed and well-nourished.  HENT:  Head: Normocephalic.  MM slightly dry   Eyes: Conjunctivae are normal. Pupils are equal, round, and reactive to light.  Neck: Normal range of motion. Neck supple.  Cardiovascular: Normal rate, regular rhythm and normal heart sounds.   Pulmonary/Chest: Effort normal and breath sounds normal. No respiratory distress. She has no wheezes. She has no rales.  Abdominal: Soft. Bowel sounds are normal. She exhibits no distension. There is no tenderness. There is no rebound and no guarding.  Musculoskeletal: Normal range of motion. She exhibits no edema and no tenderness.  Neurological: She is alert and oriented to person, place, and time. No cranial nerve deficit. Coordination normal.  Skin: Skin is warm and dry.  Psychiatric: She has a normal mood and affect. Her behavior is normal. Judgment and thought content normal.    ED Course  Procedures (including critical care time)  CRITICAL CARE Performed by: Darl Householder, DAVID   Total critical care time: 30 min   Critical care time was exclusive of separately billable procedures and  treating other patients.  Critical care was necessary to treat or prevent imminent or life-threatening deterioration.  Critical care was time spent personally by me on the following activities: development of treatment plan with patient and/or surrogate as well as nursing, discussions with consultants, evaluation of patient's response to treatment, examination of patient, obtaining history from patient or surrogate, ordering and performing treatments and interventions, ordering and review of laboratory studies, ordering and review of radiographic studies, pulse oximetry and re-evaluation of patient's condition.   Labs Review Labs Reviewed  COMPREHENSIVE METABOLIC PANEL - Abnormal; Notable for the following:    Sodium 130 (*)    Chloride 89 (*)    Glucose, Bld 619 (*)    Alkaline Phosphatase 162 (*)    Total Bilirubin 0.2 (*)    GFR calc non Af Amer 57 (*)    GFR calc Af Amer 66 (*)    Anion gap 18 (*)    All other components within normal limits  URINALYSIS, ROUTINE W REFLEX MICROSCOPIC - Abnormal; Notable for the following:    Glucose, UA >1000 (*)    All other components within normal limits  VALPROIC ACID LEVEL - Abnormal; Notable for the following:    Valproic Acid Lvl 45.1 (*)    All other components within normal limits  CBG MONITORING, ED - Abnormal; Notable for the following:    Glucose-Capillary 574 (*)    All other components within normal limits  CBG MONITORING, ED - Abnormal; Notable for the following:    Glucose-Capillary 450 (*)    All other components within normal limits  CBC WITH DIFFERENTIAL  URINE MICROSCOPIC-ADD ON    Imaging Review No results found.   EKG Interpretation None      MDM   Final diagnoses:  None    [name] is a 58 y.o. female here with hyperglycemia. Likely poorly controlled diabetes. Will  get labs to r/o DKA. Will hydrate and give sub Q insulin for now.   11:21 PM After 2L NS and 10 U SubQ insulin, CBG was 450. Initial labs  show glucose 620, AG 18. No ketones in urine. But she is difficult to control DM. Will start glucostabilizer. Will admit.    Wandra Arthurs, MD 12/06/13 2324  Wandra Arthurs, MD 12/06/13 [phone]

## 2013-12-07 ENCOUNTER — Inpatient Hospital Stay (HOSPITAL_COMMUNITY): Payer: PRIVATE HEALTH INSURANCE

## 2013-12-07 DIAGNOSIS — E119 Type 2 diabetes mellitus without complications: Secondary | ICD-10-CM | POA: Diagnosis not present

## 2013-12-07 LAB — GLUCOSE, CAPILLARY
GLUCOSE-CAPILLARY: 259 mg/dL — AB (ref 70–99)
GLUCOSE-CAPILLARY: 122 mg/dL — AB (ref 70–99)
GLUCOSE-CAPILLARY: 208 mg/dL — AB (ref 70–99)
GLUCOSE-CAPILLARY: 209 mg/dL — AB (ref 70–99)
Glucose-Capillary: 120 mg/dL — ABNORMAL HIGH (ref 70–99)
Glucose-Capillary: 203 mg/dL — ABNORMAL HIGH (ref 70–99)
Glucose-Capillary: 377 mg/dL — ABNORMAL HIGH (ref 70–99)
Glucose-Capillary: 429 mg/dL — ABNORMAL HIGH (ref 70–99)

## 2013-12-07 LAB — BASIC METABOLIC PANEL
ANION GAP: 13 (ref 5–15)
ANION GAP: 14 (ref 5–15)
Anion gap: 13 (ref 5–15)
Anion gap: 15 (ref 5–15)
Anion gap: 16 — ABNORMAL HIGH (ref 5–15)
BUN: 18 mg/dL (ref 6–23)
BUN: 17 mg/dL (ref 6–23)
BUN: 16 mg/dL (ref 6–23)
BUN: 15 mg/dL (ref 6–23)
BUN: 15 mg/dL (ref 6–23)
CALCIUM: 8.5 mg/dL (ref 8.4–10.5)
CHLORIDE: 99 meq/L (ref 96–112)
CHLORIDE: 103 meq/L (ref 96–112)
CHLORIDE: 100 meq/L (ref 96–112)
CHLORIDE: 100 meq/L (ref 96–112)
CO2: 23 meq/L (ref 19–32)
CO2: 22 meq/L (ref 19–32)
CO2: 22 mEq/L (ref 19–32)
CO2: 24 mEq/L (ref 19–32)
CO2: 22 mEq/L (ref 19–32)
CREATININE: 0.91 mg/dL (ref 0.50–1.10)
CREATININE: 0.92 mg/dL (ref 0.50–1.10)
CREATININE: 0.92 mg/dL (ref 0.50–1.10)
CREATININE: 0.92 mg/dL (ref 0.50–1.10)
Calcium: 8.9 mg/dL (ref 8.4–10.5)
Calcium: 9 mg/dL (ref 8.4–10.5)
Calcium: 8.7 mg/dL (ref 8.4–10.5)
Calcium: 8.5 mg/dL (ref 8.4–10.5)
Chloride: 101 mEq/L (ref 96–112)
Creatinine, Ser: 0.98 mg/dL (ref 0.50–1.10)
GFR calc non Af Amer: 64 mL/min — ABNORMAL LOW (ref >90)
GFR calc non Af Amer: 70 mL/min — ABNORMAL LOW (ref >90)
GFR calc non Af Amer: 69 mL/min — ABNORMAL LOW (ref >90)
GFR calc non Af Amer: 69 mL/min — ABNORMAL LOW (ref >90)
GFR calc non Af Amer: 69 mL/min — ABNORMAL LOW (ref >90)
GFR, EST AFRICAN AMERICAN: 74 mL/min — AB (ref >90)
GFR, EST AFRICAN AMERICAN: 81 mL/min — AB (ref >90)
GFR, EST AFRICAN AMERICAN: 80 mL/min — AB (ref >90)
GFR, EST AFRICAN AMERICAN: 80 mL/min — AB (ref >90)
GFR, EST AFRICAN AMERICAN: 80 mL/min — AB (ref >90)
Glucose, Bld: 423 mg/dL — ABNORMAL HIGH (ref 70–99)
Glucose, Bld: 125 mg/dL — ABNORMAL HIGH (ref 70–99)
Glucose, Bld: 141 mg/dL — ABNORMAL HIGH (ref 70–99)
Glucose, Bld: 228 mg/dL — ABNORMAL HIGH (ref 70–99)
Glucose, Bld: 229 mg/dL — ABNORMAL HIGH (ref 70–99)
POTASSIUM: 4.1 meq/L (ref 3.7–5.3)
Potassium: 4 mEq/L (ref 3.7–5.3)
Potassium: 4.6 mEq/L (ref 3.7–5.3)
Potassium: 4.3 mEq/L (ref 3.7–5.3)
Potassium: 4.4 mEq/L (ref 3.7–5.3)
SODIUM: 140 meq/L (ref 137–147)
SODIUM: 138 meq/L (ref 137–147)
SODIUM: 136 meq/L — AB (ref 137–147)
Sodium: 135 mEq/L — ABNORMAL LOW (ref 137–147)
Sodium: 138 mEq/L (ref 137–147)

## 2013-12-07 LAB — RAPID URINE DRUG SCREEN, HOSP PERFORMED
Amphetamines: NOT DETECTED
BENZODIAZEPINES: NOT DETECTED
Barbiturates: NOT DETECTED
Cocaine: POSITIVE — AB
OPIATES: POSITIVE — AB
Tetrahydrocannabinol: NOT DETECTED

## 2013-12-07 LAB — HEMOGLOBIN A1C
Hgb A1c MFr Bld: 14.8 % — ABNORMAL HIGH (ref <5.7)
MEAN PLASMA GLUCOSE: 378 mg/dL — AB (ref <117)

## 2013-12-07 LAB — CBG MONITORING, ED: Glucose-Capillary: 401 mg/dL — ABNORMAL HIGH (ref 70–99)

## 2013-12-07 MED ORDER — AMLODIPINE BESYLATE 5 MG PO TABS
5.0000 mg | ORAL_TABLET | Freq: Every day | ORAL | Status: DC
  Administered 2013-12-07: 5 mg via ORAL
  Filled 2013-12-07: qty 1

## 2013-12-07 MED ORDER — ONDANSETRON HCL 4 MG/2ML IJ SOLN: 4.0000 mg | Freq: Three times a day (TID) | INTRAMUSCULAR | Status: DC | PRN

## 2013-12-07 MED ORDER — PANTOPRAZOLE SODIUM 40 MG PO TBEC
40.0000 mg | DELAYED_RELEASE_TABLET | Freq: Every day | ORAL | Status: DC
  Administered 2013-12-07: 40 mg via ORAL
  Filled 2013-12-07: qty 1

## 2013-12-07 MED ORDER — ALLOPURINOL 300 MG PO TABS
300.0000 mg | ORAL_TABLET | Freq: Every day | ORAL | Status: DC
  Administered 2013-12-07: 300 mg via ORAL
  Filled 2013-12-07: qty 1

## 2013-12-07 MED ORDER — ALPRAZOLAM 0.5 MG PO TABS: 0.5000 mg | ORAL_TABLET | Freq: Three times a day (TID) | ORAL | Status: DC

## 2013-12-07 MED ORDER — SERTRALINE HCL 100 MG PO TABS
100.0000 mg | ORAL_TABLET | Freq: Every day | ORAL | Status: DC
  Administered 2013-12-07: 100 mg via ORAL
  Filled 2013-12-07: qty 1

## 2013-12-07 MED ORDER — DEXTROSE-NACL 5-0.45 % IV SOLN
INTRAVENOUS | Status: DC
  Administered 2013-12-07: 05:00:00 via INTRAVENOUS

## 2013-12-07 MED ORDER — MOMETASONE FURO-FORMOTEROL FUM 100-5 MCG/ACT IN AERO
2.0000 | INHALATION_SPRAY | Freq: Two times a day (BID) | RESPIRATORY_TRACT | Status: DC
  Administered 2013-12-07: 2 via RESPIRATORY_TRACT
  Filled 2013-12-07: qty 8.8

## 2013-12-07 MED ORDER — ALPRAZOLAM 0.5 MG PO TABS
0.5000 mg | ORAL_TABLET | Freq: Three times a day (TID) | ORAL | Status: DC | PRN
  Administered 2013-12-07: 0.5 mg via ORAL
  Filled 2013-12-07: qty 1

## 2013-12-07 MED ORDER — DEXTROSE 50 % IV SOLN: 25.0000 mL | INTRAVENOUS | Status: DC | PRN

## 2013-12-07 MED ORDER — SODIUM CHLORIDE 0.9 % IV SOLN
INTRAVENOUS | Status: DC
Start: 2013-12-07 — End: 2013-12-07
  Administered 2013-12-07: 12:00:00 via INTRAVENOUS

## 2013-12-07 MED ORDER — GABAPENTIN 300 MG PO CAPS
300.0000 mg | ORAL_CAPSULE | Freq: Two times a day (BID) | ORAL | Status: DC
  Administered 2013-12-07 (×2): 300 mg via ORAL
  Filled 2013-12-07 (×3): qty 1

## 2013-12-07 MED ORDER — LATANOPROST 0.005 % OP SOLN
1.0000 [drp] | Freq: Two times a day (BID) | OPHTHALMIC | Status: DC
  Filled 2013-12-07: qty 2.5

## 2013-12-07 MED ORDER — OXYCODONE HCL 5 MG PO TABS: 15.0000 mg | ORAL_TABLET | Freq: Three times a day (TID) | ORAL | Status: DC | PRN

## 2013-12-07 MED ORDER — FLUCONAZOLE 150 MG PO TABS
150.0000 mg | ORAL_TABLET | Freq: Once | ORAL | Status: AC
  Administered 2013-12-07: 150 mg via ORAL
  Filled 2013-12-07: qty 1

## 2013-12-07 MED ORDER — TOPIRAMATE 25 MG PO TABS
25.0000 mg | ORAL_TABLET | Freq: Two times a day (BID) | ORAL | Status: DC | PRN
  Filled 2013-12-07: qty 1

## 2013-12-07 MED ORDER — INSULIN ASPART 100 UNIT/ML ~~LOC~~ SOLN
0.0000 [IU] | Freq: Three times a day (TID) | SUBCUTANEOUS | Status: DC
  Administered 2013-12-07: 15 [IU] via SUBCUTANEOUS
  Administered 2013-12-07: 20 [IU] via SUBCUTANEOUS

## 2013-12-07 MED ORDER — INSULIN GLARGINE 100 UNIT/ML ~~LOC~~ SOLN
20.0000 [IU] | Freq: Every day | SUBCUTANEOUS | Status: DC
  Administered 2013-12-07: 20 [IU] via SUBCUTANEOUS
  Filled 2013-12-07: qty 0.2

## 2013-12-07 MED ORDER — ASPIRIN EC 81 MG PO TBEC
81.0000 mg | DELAYED_RELEASE_TABLET | Freq: Every day | ORAL | Status: DC
  Administered 2013-12-07: 81 mg via ORAL
  Filled 2013-12-07: qty 1

## 2013-12-07 MED ORDER — INSULIN REGULAR HUMAN 100 UNIT/ML IJ SOLN
INTRAMUSCULAR | Status: AC
  Administered 2013-12-07: 6.8 [IU]/h via INTRAVENOUS

## 2013-12-07 MED ORDER — ATORVASTATIN CALCIUM 80 MG PO TABS
80.0000 mg | ORAL_TABLET | Freq: Every day | ORAL | Status: DC
  Administered 2013-12-07: 80 mg via ORAL
  Filled 2013-12-07: qty 1

## 2013-12-07 MED ORDER — LEVOTHYROXINE SODIUM 75 MCG PO TABS
75.0000 ug | ORAL_TABLET | Freq: Every day | ORAL | Status: DC
  Administered 2013-12-07: 75 ug via ORAL
  Filled 2013-12-07 (×2): qty 1

## 2013-12-07 MED ORDER — ALBUTEROL SULFATE HFA 108 (90 BASE) MCG/ACT IN AERS: 2.0000 | INHALATION_SPRAY | Freq: Four times a day (QID) | RESPIRATORY_TRACT | Status: DC | PRN

## 2013-12-07 MED ORDER — OXYCODONE HCL 5 MG PO TABS
5.0000 mg | ORAL_TABLET | Freq: Four times a day (QID) | ORAL | Status: DC | PRN
  Administered 2013-12-07 (×3): 5 mg via ORAL
  Filled 2013-12-07 (×3): qty 1

## 2013-12-07 MED ORDER — POTASSIUM CHLORIDE 10 MEQ/100ML IV SOLN
10.0000 meq | INTRAVENOUS | Status: AC
  Administered 2013-12-07: 10 meq via INTRAVENOUS
  Filled 2013-12-07 (×2): qty 100

## 2013-12-07 MED ORDER — DIVALPROEX SODIUM 500 MG PO DR TAB
1000.0000 mg | DELAYED_RELEASE_TABLET | Freq: Every day | ORAL | Status: DC
  Administered 2013-12-07: 1000 mg via ORAL
  Filled 2013-12-07 (×2): qty 2

## 2013-12-07 MED ORDER — ENOXAPARIN SODIUM 40 MG/0.4ML ~~LOC~~ SOLN
40.0000 mg | Freq: Every day | SUBCUTANEOUS | Status: DC
  Administered 2013-12-07: 40 mg via SUBCUTANEOUS
  Filled 2013-12-07: qty 0.4

## 2013-12-07 MED ORDER — LISINOPRIL 40 MG PO TABS
40.0000 mg | ORAL_TABLET | Freq: Every day | ORAL | Status: DC
  Administered 2013-12-07: 40 mg via ORAL
  Filled 2013-12-07: qty 1

## 2013-12-07 MED ORDER — ALBUTEROL SULFATE (2.5 MG/3ML) 0.083% IN NEBU
2.5000 mg | INHALATION_SOLUTION | Freq: Four times a day (QID) | RESPIRATORY_TRACT | Status: DC | PRN
  Administered 2013-12-07: 2.5 mg via RESPIRATORY_TRACT
  Filled 2013-12-07: qty 3

## 2013-12-07 NOTE — Progress Notes (Signed)
Discharge documentation reviewed with pt; allowing time for questions. Pt verbalized understanding. Both IVs removed without issue. Pt leaving unit via wheelchair, accompanied by Tech.

## 2013-12-07 NOTE — Progress Notes (Signed)
Patient refused second dose of IV KCL. Dr. Marvel Plan notified.

## 2013-12-07 NOTE — Discharge Summary (Signed)
Attending Discharge Note: This patient was seen and examined on the day of discharge which was the same day as admission.  See attending admission note.

## 2013-12-07 NOTE — Progress Notes (Signed)
Patient arrived on unit via stretcher from ED, no family at bedside. Patient alert & oriented, insulin drip resumed on unit. Skin assessed. Patient instructed on call bell use.

## 2013-12-07 NOTE — H&P (Signed)
Attending physician admission note: I personally interviewed and examined this patient. History, physical findings, active problems, evaluation, and management plan, accurate as recorded above by resident physician Dr. Osa Craver. Clinical summary: 58 year old woman with hypertension, type 2 diabetes now insulin-dependent, poorly compliant, with most recent hemoglobin A1c 15.5 recorded on 09/03/2013 chronic renal insufficiency, cerebrovascular disease with history of TIA x2, history of substance abuse with recent cocaine use. Glaucoma. Bipolar disorder. She presents with blood sugar over 600. No signs or symptoms of infection. She states that she has been compliant with her medications. She denied any dietary indiscretions. There are no ketones in her urine. She does have an elevated anion gap of 22 but the serum bicarbonate is 23. She is admitted for glucose control with hyperosmolar non-ketotic diabetes. Current exam: Pleasant African American woman in no distress Blood pressure 125/86, pulse 61, temperature 97.4 F (36.3 C), temperature source Oral, resp. rate 19, height 5\' 2"  (1.575 m), weight 193 lb 9.6 oz (87.816 kg), SpO2 92.00%. Lungs are clear. Regular cardiac rhythm no murmur. Abdomen soft currently nontender. No edema. No calf tenderness. Impression: Poorly controlled diabetes Plan: Parenteral insulin infusion has been initiated. Insulin doses will be adjusted. No other acute issues identified at this time.

## 2013-12-07 NOTE — Progress Notes (Signed)
Subjective: [name] tolerated her hydration and insulin drip well overnight. She was transitioned to subcutaneous insulin this morning. She complained of bilateral shooting pain down her legs that is relieved by taking weight off her legs related to her DM2. She denies chest pain, SOB, palpitations, blurry vision, fevers, chills, night sweats, abdominal pain.  Objective: Vital signs in last 24 hours: Filed Vitals:   12/07/13 0214 12/07/13 0441 12/07/13 0947 12/07/13 1049  BP: 127/76 120/79 125/86   Pulse: 52 67 61   Temp: 97.5 F (36.4 C) 97.6 F (36.4 C) 97.4 F (36.3 C)   TempSrc: Oral Oral Oral   Resp: 19 18 19    Height: 5\' 2"  (1.575 m)     Weight: 193 lb 9.6 oz (87.816 kg)     SpO2: 93% 90% 90% 92%   Weight change:  No intake or output data in the 24 hours ending 12/07/13 1601 BP 125/86  Pulse 61  Temp(Src) 97.4 F (36.3 C) (Oral)  Resp 19  Ht 5\' 2"  (1.575 m)  Wt 193 lb 9.6 oz (87.816 kg)  BMI 35.40 kg/m2  SpO2 92%  General Appearance:    Alert, cooperative, no distress, appears stated age  Head:    Normocephalic, without obvious abnormality, atraumatic  Eyes:    PERRL, conjunctiva/corneas clear, EOM's intact  Throat:   Moist mucous membranes  Back:     Symmetric, no curvature  Lungs:     Clear to auscultation bilaterally, respirations unlabored  Chest Wall:    No tenderness or deformity   Heart:    Regular rate and rhythm, S1 and S2 normal, no murmur, rub   or gallop  Abdomen:     Soft, non-tender, bowel sounds active all four quadrants,    no masses, no organomegaly  Extremities:   Extremities normal, atraumatic, no cyanosis or edema  Pulses:   2+ and symmetric radial and DP pulses   Lab Results: Basic Metabolic Panel:  Recent Labs Lab 12/07/13 0707 12/07/13 0830  NA 138 136*  K 4.3 4.4  CL 101 100  CO2 24 22  GLUCOSE 228* 229*  BUN 15 15  CREATININE 0.92 0.92  CALCIUM 8.5 8.5   Liver Function Tests:  Recent Labs Lab 12/06/13 2118  AST 19    ALT 17  ALKPHOS 162*  BILITOT 0.2*  PROT 7.5  ALBUMIN 3.6   No results found for this basename: LIPASE, AMYLASE,  in the last 168 hours No results found for this basename: AMMONIA,  in the last 168 hours CBC:  Recent Labs Lab 12/06/13 2118  WBC 6.9  NEUTROABS 3.9  HGB 13.3  HCT 39.2  MCV 87.3  PLT PLATELET CLUMPS NOTED ON SMEAR, COUNT APPEARS ADEQUATE   Cardiac Enzymes: No results found for this basename: CKTOTAL, CKMB, CKMBINDEX, TROPONINI,  in the last 168 hours BNP: No results found for this basename: PROBNP,  in the last 168 hours D-Dimer: No results found for this basename: DDIMER,  in the last 168 hours CBG:  Recent Labs Lab 12/07/13 0320 12/07/13 0437 12/07/13 0540 12/07/13 0649 12/07/13 0817 12/07/13 1203  GLUCAP 122* 120* 208* 209* 203* 377*   Hemoglobin A1C:  Recent Labs Lab 12/07/13 0505  HGBA1C 14.8*   Fasting Lipid Panel: No results found for this basename: CHOL, HDL, LDLCALC, TRIG, CHOLHDL, LDLDIRECT,  in the last 168 hours Thyroid Function Tests: No results found for this basename: TSH, T4TOTAL, FREET4, T3FREE, THYROIDAB,  in the last 168 hours Coagulation: No results  found for this basename: LABPROT, INR,  in the last 168 hours Anemia Panel: No results found for this basename: VITAMINB12, FOLATE, FERRITIN, TIBC, IRON, RETICCTPCT,  in the last 168 hours Urine Drug Screen: Drugs of Abuse     Component Value Date/Time   LABOPIA POSITIVE* 12/07/2013 0234   LABOPIA NEG 01/23/2013 1507   COCAINSCRNUR POSITIVE* 12/07/2013 0234   COCAINSCRNUR NEG 01/23/2013 1507   LABBENZ NONE DETECTED 12/07/2013 0234   LABBENZ NEG 01/23/2013 1507   AMPHETMU NONE DETECTED 12/07/2013 0234   THCU NONE DETECTED 12/07/2013 0234   LABBARB NONE DETECTED 12/07/2013 0234   LABBARB NEG 01/23/2013 1507    Alcohol Level: No results found for this basename: ETH,  in the last 168 hours Urinalysis:  Recent Labs Lab 12/06/13 2110  COLORURINE YELLOW  LABSPEC 1.029  PHURINE  6.5  GLUCOSEU >1000*  HGBUR NEGATIVE  BILIRUBINUR NEGATIVE  KETONESUR NEGATIVE  PROTEINUR NEGATIVE  UROBILINOGEN 0.2  NITRITE NEGATIVE  [place]. Labs: Valproic acid level 45.1  Micro Results: No results found for this or any previous visit (from the past 240 hour(s)). Studies/Results: Dg Chest Port 1 View  12/07/2013   CLINICAL DATA:  Cough  EXAM: PORTABLE CHEST - 1 VIEW  COMPARISON:  Oct 01, 2013  FINDINGS: Lungs are clear. Heart size and pulmonary vascularity are normal. No adenopathy. No bone lesions.  IMPRESSION: No edema or consolidation.   Electronically Signed   By: Lowella Grip M.D.   On: 12/07/2013 01:09   Medications: I have reviewed the patient's current medications. Scheduled Meds: . allopurinol  300 mg Oral Daily  . amLODipine  5 mg Oral Daily  . aspirin EC  81 mg Oral Daily  . atorvastatin  80 mg Oral Daily  . divalproex  1,000 mg Oral QHS  . enoxaparin (LOVENOX) injection  40 mg Subcutaneous Daily  . gabapentin  300 mg Oral BID  . insulin aspart  0-20 Units Subcutaneous TID WC  . insulin glargine  20 Units Subcutaneous Daily  . latanoprost  1 drop Both Eyes BID  . levothyroxine  75 mcg Oral QAC breakfast  . lisinopril  40 mg Oral Daily  . mometasone-formoterol  2 puff Inhalation BID  . pantoprazole  40 mg Oral Daily  . sertraline  100 mg Oral Daily   Continuous Infusions: . sodium chloride 10 mL/hr at 12/07/13 1214   PRN Meds:.albuterol, ALPRAZolam, dextrose, ondansetron (ZOFRAN) IV, oxyCODONE, topiramate Assessment/Plan: Active Problems:   Hyperosmolar non-ketotic state in patient with type 2 diabetes mellitus   Hyperglycemia without ketosis  1. Hyperosmolar Hyperglycemic State: [name] has tolerated her hydration and insulin drip well. She has known history of Type II DM with neuropathy and history of hyperglycemic events from medical non-compliance. Her HgbA1c was 14.8 and she lives in a hyperglycemic state. Pt takes Novolog 6  units TID before meals and Lantus 36 units at bedtime when at home. This was also in the setting of cocaine use on UDS. Her gap closed from 18 to 13 and her inslun drip was transitioned to subcutaneous insulin. Her potassium remained wnl with 10 mEq KCl iv overnight. She was asymptomatic this morning and felt stable for discharge.   -d/c insulin drip -NS @ 10 mL/hr -SSI -lantus 20 u daily -advance diet as tolerated -I/Os   2. Vaginal Candidiasis: Pt complains of dysuria and itching. Pt has recent history of vaginal candidiasis and with her hyperglycemia, it is very likely that she has recurrent infection.  UA shows negative leukocytes and negative nitrites with rare yeast.  -Fluconazole 150 mg once   3. COPD: Pt pulse ox 90-97% on room air during stay. No complaint of shortness of breath. Takes albuterol 2 puffs Q6H prn wheezing and dulera 2 puffs BID.  -Continue albuterol 2 puffs Q6H prn wheezing and dulera 2 puffs BID   4. CKD stage 3: Likely related to uncontrolled DM2. Most recent creatinine 0.92 and GFR 80. Pt is on lisinopril 40 mg daily at home.  -Continue lisinopril 40 mg   5. Hypothyroidism: Most recent TSH 93.15 on 10/27/2013. Most recent free T4 0.36 on 07/21/13. On synthroid 75 mcg daily at home.  -Stable  -Continue synthroid 75 mcg daily   6. TIA x 2 in 2012: No complaints of weakness or neurologic deficits. No focal deficits on exam. On amlodipine 5 mg daily, aspirin 81 mg daily, atorvastatin 80 mg daily, and lisinopril 40 mg daily at home.  -Continue amlodipine 5 mg daily, aspirin 81 mg daily, atorvastatin 80 mg daily, and lisinopril 40 mg daily   7. Bipolar disorder with depression and anxiety: On xanax 0.5 mg TID daily, depakote 500 mg daily, and sertraline 100 mg daily. Valproic acid level 45.1.  -Continue xanax 0.5 mg TID daily, depakote 500 mg daily, and sertraline 100 mg daily  -Stable   8. Migraine Headaches: Denies headache this morning. Topiramate 25 mg BID and Midrin  prn migraine.  -Continue Topiramate 25 mg BID   9. Substance abuse: Pt admits to cocaine use and tested positive on UDS. Last use was approximately 1 week ago. She denies any alcohol use.  -suggest substance abuse counseling  10. Gout: Last gout flare was less than 1 year ago. On allopurinol 300 mg daily and colchicine 0.6 mg daily prn gout flare. This is unrelated to current admission. -Continue allopurinol 300 mg daily and colchicine 0.6 mg daily prn gout flare    Dispo: Disposition is deferred at this time, awaiting improvement of current medical problems.  Anticipated discharge in approximately today.   The patient does have a current PCP (Charlott Rakes, MD) and does need an [facility] hospital follow-up appointment after discharge.  The patient does not know have transportation limitations that hinder transportation to clinic appointments.  .Services Needed at time of discharge: Y = Yes, Blank = No PT:   OT:   RN:   Equipment:   Other:     LOS: 1 day   Kelby Aline, MD 12/07/2013, 4:01 PM

## 2013-12-07 NOTE — Progress Notes (Signed)
Inpatient Diabetes Program Recommendations  AACE/ADA: New Consensus Statement on Inpatient Glycemic Control (2013)  Target Ranges:  Prepandial:   less than 140 mg/dL      Peak postprandial:   less than 180 mg/dL (1-2 hours)      Critically ill patients:  140 - 180 mg/dL     Results for [name], [name] (MRN [id number]) as of 12/07/2013 12:45  Ref. Range 12/06/2013 21:18  Glucose Latest Range: 70-99 mg/dL [facility])    Results for [name], [name] (MRN [id number]) as of 12/07/2013 12:45  Ref. Range 12/07/2013 01:07 12/07/2013 02:10 12/07/2013 03:20 12/07/2013 04:37 12/07/2013 05:40 12/07/2013 06:49 12/07/2013 08:17 12/07/2013 12:03  Glucose-Capillary Latest Range: 70-99 mg/dL 401 (H) 259 (H) 122 (H) 120 (H) 208 (H) 209 (H) 203 (H) 377 (H)     Admitted with Hyperglycemia.  History of DM, COPD, CKD.  Home DM Meds: Lantus 36 units QHS + Novolog 6 units tid with meals    **Glucose 619 mg/dl on admit.  Patient was given IVF and started on IV insulin drip.  Patient transitioned off IV insulin this AM to Lantus 20 units daily + Novolog Resistant SSI.    **Note patient positive for cocaine this admission.  **Note patient's 12noon CBG significantly elevated   MD- Please consider the following:  1. Increase Lantus to home dose 2. Add Novolog 6 units tid with meals (Meal Coverage)    Will follow Wyn Quaker RN, MSN, CDE Diabetes Coordinator Inpatient Diabetes Program Team Pager: [phone] (8a-10p)

## 2013-12-07 NOTE — Discharge Summary (Signed)
Name: MORGANN WOODBURN MRN: [id number] DOB: Oct 07, 1955 58 y.o. PCP: Charlott Rakes, MD  Date of Admission: 12/06/2013  8:51 PM Date of Discharge: 12/07/2013 Attending Physician: Annia Belt, MD  Discharge Diagnosis:  Active Problems:   Hyperosmolar non-ketotic state in patient with type 2 diabetes mellitus   Hyperglycemia without ketosis  Discharge Medications:   Medication List    STOP taking these medications       oxyCODONE 15 MG immediate release tablet  Commonly known as:  ROXICODONE      TAKE these medications       albuterol 108 (90 BASE) MCG/ACT inhaler  Commonly known as:  PROVENTIL HFA;VENTOLIN HFA  Inhale 2 puffs into the lungs every 6 (six) hours as needed for wheezing.     albuterol (2.5 MG/3ML) 0.083% nebulizer solution  Commonly known as:  PROVENTIL  Inhale 2.5 mg into the lungs every 6 (six) hours as needed for wheezing.     allopurinol 300 MG tablet  Commonly known as:  ZYLOPRIM  Take 1 tablet (300 mg total) by mouth daily. For gout.     ALPRAZolam 0.5 MG tablet  Commonly known as:  XANAX  Take 0.5 mg by mouth 3 (three) times daily.     amLODipine 5 MG tablet  Commonly known as:  NORVASC  Take 5 mg by mouth daily. For hypertension.     aspirin EC 81 MG tablet  Take 81 mg by mouth daily.     atorvastatin 80 MG tablet  Commonly known as:  LIPITOR  Take 80 mg by mouth daily.     colchicine 0.6 MG tablet  Take 1 tablet (0.6 mg total) by mouth daily as needed (for gout flare-ups).     divalproex 500 MG DR tablet  Commonly known as:  DEPAKOTE  Take 1,000 mg by mouth at bedtime.     gabapentin 100 MG capsule  Commonly known as:  NEURONTIN  Take 300 mg by mouth 2 (two) times daily.     gemfibrozil 600 MG tablet  Commonly known as:  LOPID  Take 600 mg by mouth daily.     insulin aspart 100 UNIT/ML injection  Commonly known as:  novoLOG  Inject 6 Units into the skin 3 (three) times daily before meals.     insulin glargine 100 UNIT/ML  injection  Commonly known as:  LANTUS  Inject 0.36 mLs (36 Units total) into the skin at bedtime.     isometheptene-acetaminophen-dichloralphenazone 65-325-100 MG capsule  Commonly known as:  MIDRIN  Take 1 capsule by mouth 4 (four) times daily as needed for migraine. Maximum 5 capsules in 12 hours for migraine headaches, 8 capsules in 24 hours for tension headaches.     latanoprost 0.005 % ophthalmic solution  Commonly known as:  XALATAN  Place 1 drop into both eyes 2 (two) times daily.     levothyroxine 75 MCG tablet  Commonly known as:  SYNTHROID, LEVOTHROID  Take 1 tablet (75 mcg total) by mouth daily before breakfast.     lisinopril 40 MG tablet  Commonly known as:  PRINIVIL,ZESTRIL  Take 1 tablet (40 mg total) by mouth daily.     mometasone-formoterol 100-5 MCG/ACT Aero  Commonly known as:  DULERA  Inhale 2 puffs into the lungs 2 (two) times daily.     nitroGLYCERIN 0.4 MG SL tablet  Commonly known as:  NITROSTAT  Place 1 tablet (0.4 mg total) under the tongue every 5 (five) minutes as needed for chest pain.  ondansetron 8 MG disintegrating tablet  Commonly known as:  ZOFRAN ODT  Take 1 tablet (8 mg total) by mouth every 8 (eight) hours as needed for nausea or vomiting.     pantoprazole 40 MG tablet  Commonly known as:  PROTONIX  Take 1 tablet (40 mg total) by mouth daily. For reflux.     sertraline 100 MG tablet  Commonly known as:  ZOLOFT  Take 1 tablet (100 mg total) by mouth daily.     SIMBRINZA 1-0.2 % Susp  Generic drug:  Brinzolamide-Brimonidine  Place 1 drop into both eyes 2 (two) times daily.     topiramate 25 MG tablet  Commonly known as:  TOPAMAX  Take 25 mg by mouth 2 (two) times daily as needed (migraine).     traMADol 50 MG tablet  Commonly known as:  ULTRAM  Take 50 mg by mouth every 6 (six) hours as needed for moderate pain.        Disposition and follow-up:   Ms.Nuala T Roldan was discharged from [facility] in [place]  condition.  At the hospital follow up visit please address:  1.  Glycemic control (medication adherence, diet, etc), substance abuse  2.  Labs / imaging needed at time of follow-up: none  3.  Pending labs/ test needing follow-up: none  Follow-up Appointments: Follow-up Information   Follow up with Charlott Rakes, MD On 12/11/2013. (@ 9:30)    Specialty:  Internal Medicine   Contact information:   [place] [address] [phone]       Discharge Instructions: Discharge Instructions   Diet - low sodium heart healthy    Complete by:  As directed      Increase activity slowly    Complete by:  As directed            Procedures Performed:  Dg Chest Port 1 View  12/07/2013   CLINICAL DATA:  Cough  EXAM: PORTABLE CHEST - 1 VIEW  COMPARISON:  Oct 01, 2013  FINDINGS: Lungs are clear. Heart size and pulmonary vascularity are normal. No adenopathy. No bone lesions.  IMPRESSION: No edema or consolidation.   Electronically Signed   By: Lowella Grip M.D.   On: 12/07/2013 01:09   Admission HPI: Ms. Heckmann is a 58 y.o. Female with PMHx of Type II DM, COPD, CKD stage 3, hyperthyroidism, TIA x 2 in 2012, bipolar disorder, and substance abuse who presents with complaint of high blood sugar. Pt states that this morning her blood sugar was in the 400s. Tonight after dinner she felt dizzy and checked her blood sugar and it was over 600. She called EMS who brought her to the ED. Pt states her normal glucose is between 250 and 300. She admits to missing one of her doses of novolog recently, but states she has been very good about taking her scheduled insulin-- 6 units of Novolog at meal time and 34 units of Lantus at bedtime. She states her diet has been good--she has been measuring and limiting her carb intake. She admits to you using cocaine about a week ago, but denies any alcohol use. She does state that she has had a productive cough with green mucus for about one week. She admits to  nausea and one episode of vomiting, but denies fever, chills or abdominal pain.  Hospital Course by problem list: Active Problems:   Hyperosmolar non-ketotic state in patient with type 2 diabetes mellitus   Hyperglycemia without ketosis   #  Hyperosmolar Hyperglycemic State: Ms. Hofmeister presented with a blood sugar over 600 in hyperosmolar hyperglycemic status. She has known history of Type II DM with neuropathy and history of hyperglycemic events from medical non-compliance. Her HgbA1c was 14.8 and she lives in a hyperglycemic state. Pt takes Novolog 6 units TID before meals and Lantus 36 units at bedtime when at home. This was also in the setting of cocaine use on UDS. Her gap closed from 18 to 13 and her insulin drip was transitioned to subcutaneous insulin (lantus 20 u daily and a sliding scale insulin). She tolerated her hydration and insulin drip well. Her potassium remained wnl with 10 mEq KCl iv overnight. She was asymptomatic this morning and tolerated po foods well.  She was felt stable for discharge and should be counseled about glycemic control and substance abuse as an outpatient. She was discharged on lantus 36 u qhs and novolog 6 u TIDAC.   #Vaginal Candidiasis: Ms. Fargo complained of dysuria and itching. She has recent history of vaginal candidiasis and with her hyperglycemia, it is very likely that she has recurrent infection. UA shows negative leukocytes and negative nitrites with rare yeast. She received fluconazole 150 mg once   #COPD: Her pulse ox ranged from 90-97% on room air during her stay. She had no complaint of shortness of breath. She takes albuterol 2 puffs Q6H prn wheezing and dulera 2 puffs BID.   #CKD stage 3: Ms. Starks's kidney disease is likely related to uncontrolled DM2. Her creatinine was 0.98 on admission and on discharge was 0.92 with a GFR of 80. She is on lisinopril 40 mg daily at home.   #Hypothyroidism: Ms. Bourdeau most recent TSH was 93.15 and free T4 was 0.36  on 10/27/2013. She continued her home synthroid 75 mcg daily.  #TIA x 2 in 2012: Ms. Tupou denied any weakness or neurologic deficits. She had no focal deficits on exam. She continued her amlodipine 5 mg daily, aspirin 81 mg daily, atorvastatin 80 mg daily, and lisinopril 40 mg daily.  #Bipolar disorder with depression and anxiety: Ms. Bajaj is on xanax 0.5 mg TID daily, depakote 500 mg daily, and sertraline 100 mg daily which were all continued. Her valproic acid level measured 45.1 which is slightly low.   #Migraine Headaches: Ms. Gutman denied any headache during her stay. She continued her topiramate 25 mg BID and midrin prn migraine.   #Substance abuse: Ms. Quickel admitted to recent cocaine use about one week ago and tested positive on UDS. She denies any alcohol use. She would benefit from substance abuse counseling as an outpatient.  #Gout: Her last gout flare was less than 1 year ago and is unrelated to the current admission. She continued her allopurinol 300 mg daily and colchicine 0.6 mg daily prn.   Discharge Vitals:   BP 125/86  Pulse 61  Temp(Src) 97.4 F (36.3 C) (Oral)  Resp 19  Ht 5\' 2"  (1.575 m)  Wt 193 lb 9.6 oz (87.816 kg)  BMI 35.40 kg/m2  SpO2 92%  Discharge Labs:  Results for orders placed during the hospital encounter of 12/06/13 (from the past 24 hour(s))  URINALYSIS, ROUTINE W REFLEX MICROSCOPIC     Status: Abnormal   Collection Time    12/06/13  9:10 PM      Result Value Ref Range   Color, Urine YELLOW  YELLOW   APPearance CLEAR  CLEAR   Specific Gravity, Urine 1.029  1.005 - 1.030   pH 6.5  5.0 -  8.0   Glucose, UA >1000 (*) NEGATIVE mg/dL   Hgb urine dipstick NEGATIVE  NEGATIVE   Bilirubin Urine NEGATIVE  NEGATIVE   Ketones, ur NEGATIVE  NEGATIVE mg/dL   Protein, ur NEGATIVE  NEGATIVE mg/dL   Urobilinogen, UA 0.2  0.0 - 1.0 mg/dL   Nitrite NEGATIVE  NEGATIVE   Leukocytes, UA NEGATIVE  NEGATIVE  URINE MICROSCOPIC-ADD ON     Status: None   Collection  Time    12/06/13  9:10 PM      Result Value Ref Range   Squamous Epithelial / LPF RARE  RARE   WBC, UA 0-2  <3 WBC/hpf   Bacteria, UA RARE  RARE   Urine-Other RARE YEAST    CBG MONITORING, ED     Status: Abnormal   Collection Time    12/06/13  9:17 PM      Result Value Ref Range   Glucose-Capillary 574 (*) 70 - 99 mg/dL   Comment 1 Notify RN    CBC WITH DIFFERENTIAL     Status: None   Collection Time    12/06/13  9:18 PM      Result Value Ref Range   WBC 6.9  4.0 - 10.5 K/uL   RBC 4.49  3.87 - 5.11 MIL/uL   Hemoglobin 13.3  12.0 - 15.0 g/dL   HCT 39.2  36.0 - 46.0 %   MCV 87.3  78.0 - 100.0 fL   MCH 29.6  26.0 - 34.0 pg   MCHC 33.9  30.0 - 36.0 g/dL   RDW 15.1  11.5 - 15.5 %   Platelets    150 - 400 K/uL   Value: PLATELET CLUMPS NOTED ON SMEAR, COUNT APPEARS ADEQUATE   Neutrophils Relative % 56  43 - 77 %   Lymphocytes Relative 35  12 - 46 %   Monocytes Relative 6  3 - 12 %   Eosinophils Relative 3  0 - 5 %   Basophils Relative 0  0 - 1 %   Neutro Abs 3.9  1.7 - 7.7 K/uL   Lymphs Abs 2.4  0.7 - 4.0 K/uL   Monocytes Absolute 0.4  0.1 - 1.0 K/uL   Eosinophils Absolute 0.2  0.0 - 0.7 K/uL   Basophils Absolute 0.0  0.0 - 0.1 K/uL  COMPREHENSIVE METABOLIC PANEL     Status: Abnormal   Collection Time    12/06/13  9:18 PM      Result Value Ref Range   Sodium 130 (*) 137 - 147 mEq/L   Potassium 4.5  3.7 - 5.3 mEq/L   Chloride 89 (*) 96 - 112 mEq/L   CO2 23  19 - 32 mEq/L   Glucose, Bld 619 (*) 70 - 99 mg/dL   BUN 22  6 - 23 mg/dL   Creatinine, Ser 1.08  0.50 - 1.10 mg/dL   Calcium 9.5  8.4 - 10.5 mg/dL   Total Protein 7.5  6.0 - 8.3 g/dL   Albumin 3.6  3.5 - 5.2 g/dL   AST 19  0 - 37 U/L   ALT 17  0 - 35 U/L   Alkaline Phosphatase 162 (*) 39 - 117 U/L   Total Bilirubin 0.2 (*) 0.3 - 1.2 mg/dL   GFR calc non Af Amer 57 (*) >90 mL/min   GFR calc Af Amer 66 (*) >90 mL/min   Anion gap 18 (*) 5 - 15  VALPROIC ACID LEVEL     Status: Abnormal   Collection  Time    12/06/13   9:47 PM      Result Value Ref Range   Valproic Acid Lvl 45.1 (*) 50.0 - 100.0 ug/mL  CBG MONITORING, ED     Status: Abnormal   Collection Time    12/06/13 10:56 PM      Result Value Ref Range   Glucose-Capillary 450 (*) 70 - 99 mg/dL  CBG MONITORING, ED     Status: Abnormal   Collection Time    12/06/13 11:49 PM      Result Value Ref Range   Glucose-Capillary 437 (*) 70 - 99 mg/dL  CBG MONITORING, ED     Status: Abnormal   Collection Time    12/07/13  1:07 AM      Result Value Ref Range   Glucose-Capillary 401 (*) 70 - 99 mg/dL  BASIC METABOLIC PANEL     Status: Abnormal   Collection Time    12/07/13  1:14 AM      Result Value Ref Range   Sodium 135 (*) 137 - 147 mEq/L   Potassium 4.0  3.7 - 5.3 mEq/L   Chloride 99  96 - 112 mEq/L   CO2 23  19 - 32 mEq/L   Glucose, Bld 423 (*) 70 - 99 mg/dL   BUN 18  6 - 23 mg/dL   Creatinine, Ser 0.98  0.50 - 1.10 mg/dL   Calcium 8.9  8.4 - 10.5 mg/dL   GFR calc non Af Amer 64 (*) >90 mL/min   GFR calc Af Amer 74 (*) >90 mL/min   Anion gap 13  5 - 15  GLUCOSE, CAPILLARY     Status: Abnormal   Collection Time    12/07/13  2:10 AM      Result Value Ref Range   Glucose-Capillary 259 (*) 70 - 99 mg/dL  URINE RAPID DRUG SCREEN (HOSP PERFORMED)     Status: Abnormal   Collection Time    12/07/13  2:34 AM      Result Value Ref Range   Opiates POSITIVE (*) NONE DETECTED   Cocaine POSITIVE (*) NONE DETECTED   Benzodiazepines NONE DETECTED  NONE DETECTED   Amphetamines NONE DETECTED  NONE DETECTED   Tetrahydrocannabinol NONE DETECTED  NONE DETECTED   Barbiturates NONE DETECTED  NONE DETECTED  BASIC METABOLIC PANEL     Status: Abnormal   Collection Time    12/07/13  3:16 AM      Result Value Ref Range   Sodium 140  137 - 147 mEq/L   Potassium 4.1  3.7 - 5.3 mEq/L   Chloride 103  96 - 112 mEq/L   CO2 22  19 - 32 mEq/L   Glucose, Bld 125 (*) 70 - 99 mg/dL   BUN 17  6 - 23 mg/dL   Creatinine, Ser 0.91  0.50 - 1.10 mg/dL   Calcium 9.0   8.4 - 10.5 mg/dL   GFR calc non Af Amer 70 (*) >90 mL/min   GFR calc Af Amer 81 (*) >90 mL/min   Anion gap 15  5 - 15  GLUCOSE, CAPILLARY     Status: Abnormal   Collection Time    12/07/13  3:20 AM      Result Value Ref Range   Glucose-Capillary 122 (*) 70 - 99 mg/dL  GLUCOSE, CAPILLARY     Status: Abnormal   Collection Time    12/07/13  4:37 AM      Result Value Ref Range   Glucose-Capillary  120 (*) 70 - 99 mg/dL  BASIC METABOLIC PANEL     Status: Abnormal   Collection Time    12/07/13  5:05 AM      Result Value Ref Range   Sodium 138  137 - 147 mEq/L   Potassium 4.6  3.7 - 5.3 mEq/L   Chloride 100  96 - 112 mEq/L   CO2 22  19 - 32 mEq/L   Glucose, Bld 141 (*) 70 - 99 mg/dL   BUN 16  6 - 23 mg/dL   Creatinine, Ser 0.92  0.50 - 1.10 mg/dL   Calcium 8.7  8.4 - 10.5 mg/dL   GFR calc non Af Amer 69 (*) >90 mL/min   GFR calc Af Amer 80 (*) >90 mL/min   Anion gap 16 (*) 5 - 15  HEMOGLOBIN A1C     Status: Abnormal   Collection Time    12/07/13  5:05 AM      Result Value Ref Range   Hemoglobin A1C 14.8 (*) <5.7 %   Mean Plasma Glucose 378 (*) <117 mg/dL  GLUCOSE, CAPILLARY     Status: Abnormal   Collection Time    12/07/13  5:40 AM      Result Value Ref Range   Glucose-Capillary 208 (*) 70 - 99 mg/dL  GLUCOSE, CAPILLARY     Status: Abnormal   Collection Time    12/07/13  6:49 AM      Result Value Ref Range   Glucose-Capillary 209 (*) 70 - 99 mg/dL  BASIC METABOLIC PANEL     Status: Abnormal   Collection Time    12/07/13  7:07 AM      Result Value Ref Range   Sodium 138  137 - 147 mEq/L   Potassium 4.3  3.7 - 5.3 mEq/L   Chloride 101  96 - 112 mEq/L   CO2 24  19 - 32 mEq/L   Glucose, Bld 228 (*) 70 - 99 mg/dL   BUN 15  6 - 23 mg/dL   Creatinine, Ser 0.92  0.50 - 1.10 mg/dL   Calcium 8.5  8.4 - 10.5 mg/dL   GFR calc non Af Amer 69 (*) >90 mL/min   GFR calc Af Amer 80 (*) >90 mL/min   Anion gap 13  5 - 15  GLUCOSE, CAPILLARY     Status: Abnormal   Collection Time     12/07/13  8:17 AM      Result Value Ref Range   Glucose-Capillary 203 (*) 70 - 99 mg/dL  BASIC METABOLIC PANEL     Status: Abnormal   Collection Time    12/07/13  8:30 AM      Result Value Ref Range   Sodium 136 (*) 137 - 147 mEq/L   Potassium 4.4  3.7 - 5.3 mEq/L   Chloride 100  96 - 112 mEq/L   CO2 22  19 - 32 mEq/L   Glucose, Bld 229 (*) 70 - 99 mg/dL   BUN 15  6 - 23 mg/dL   Creatinine, Ser 0.92  0.50 - 1.10 mg/dL   Calcium 8.5  8.4 - 10.5 mg/dL   GFR calc non Af Amer 69 (*) >90 mL/min   GFR calc Af Amer 80 (*) >90 mL/min   Anion gap 14  5 - 15  GLUCOSE, CAPILLARY     Status: Abnormal   Collection Time    12/07/13 12:03 PM      Result Value Ref Range   Glucose-Capillary  377 (*) 70 - 99 mg/dL  Valproic acid level 45.1  Discharge Physical Exam: General Appearance: Alert, cooperative, no distress, appears stated age  Head: Normocephalic, without obvious abnormality, atraumatic  Eyes: PERRL, conjunctiva/corneas clear, EOM's intact  Throat: Moist mucous membranes  Back: Symmetric, no curvature  Lungs: Clear to auscultation bilaterally, respirations unlabored  Chest Wall: No tenderness or deformity  Heart: Regular rate and rhythm, S1 and S2 normal, no murmur, rub or gallop  Abdomen: Soft, non-tender, bowel sounds active all four quadrants, no masses, no organomegaly  Extremities: Extremities normal, atraumatic, no cyanosis or edema Pulses: 2+ and symmetric radial and DP pulses    Signed: Kelby Aline, MD 12/07/2013, 4:20 PM    Services Ordered on Discharge: none Equipment Ordered on Discharge: none

## 2013-12-08 ENCOUNTER — Other Ambulatory Visit: Payer: Self-pay | Admitting: Internal Medicine

## 2013-12-10 ENCOUNTER — Telehealth: Payer: Self-pay | Admitting: Internal Medicine

## 2013-12-10 NOTE — Telephone Encounter (Signed)
I spoke to [employer] and let her know the patient was recently hospitalized and if there was anything from psych standpoint. She said that there is nothing knew and that their goal is to stabilize patients psychiatrically. As such, I reiterated that I will not be prescribing any benzos or pain medicine at my appointment tomorrow with her.

## 2013-12-11 ENCOUNTER — Other Ambulatory Visit: Payer: Self-pay | Admitting: Internal Medicine

## 2013-12-11 ENCOUNTER — Ambulatory Visit: Payer: Self-pay | Admitting: Internal Medicine

## 2013-12-17 ENCOUNTER — Encounter: Payer: Self-pay | Admitting: Internal Medicine

## 2013-12-17 ENCOUNTER — Ambulatory Visit (INDEPENDENT_AMBULATORY_CARE_PROVIDER_SITE_OTHER): Payer: PRIVATE HEALTH INSURANCE | Admitting: Internal Medicine

## 2013-12-17 ENCOUNTER — Observation Stay (HOSPITAL_COMMUNITY)
Admission: AD | Admit: 2013-12-17 | Discharge: 2013-12-18 | Disposition: A | Discharge status: Home / Self Care | Payer: PRIVATE HEALTH INSURANCE | Source: Ambulatory Visit | Attending: Internal Medicine | Admitting: Internal Medicine

## 2013-12-17 VITALS — BP 112/70 | HR 59 | Temp 97.1°F | Wt 193.4 lb

## 2013-12-17 DIAGNOSIS — R42 Dizziness and giddiness: Secondary | ICD-10-CM | POA: Insufficient documentation

## 2013-12-17 DIAGNOSIS — G8929 Other chronic pain: Secondary | ICD-10-CM | POA: Diagnosis not present

## 2013-12-17 DIAGNOSIS — N183 Chronic kidney disease, stage 3 unspecified: Secondary | ICD-10-CM | POA: Insufficient documentation

## 2013-12-17 DIAGNOSIS — Z794 Long term (current) use of insulin: Secondary | ICD-10-CM | POA: Diagnosis not present

## 2013-12-17 DIAGNOSIS — E1100 Type 2 diabetes mellitus with hyperosmolarity without nonketotic hyperglycemic-hyperosmolar coma (NKHHC): Secondary | ICD-10-CM | POA: Diagnosis present

## 2013-12-17 DIAGNOSIS — K7689 Other specified diseases of liver: Secondary | ICD-10-CM | POA: Diagnosis not present

## 2013-12-17 DIAGNOSIS — F411 Generalized anxiety disorder: Secondary | ICD-10-CM | POA: Diagnosis not present

## 2013-12-17 DIAGNOSIS — F1410 Cocaine abuse, uncomplicated: Secondary | ICD-10-CM | POA: Diagnosis not present

## 2013-12-17 DIAGNOSIS — F209 Schizophrenia, unspecified: Secondary | ICD-10-CM | POA: Diagnosis not present

## 2013-12-17 DIAGNOSIS — I129 Hypertensive chronic kidney disease with stage 1 through stage 4 chronic kidney disease, or unspecified chronic kidney disease: Secondary | ICD-10-CM | POA: Insufficient documentation

## 2013-12-17 DIAGNOSIS — F1310 Sedative, hypnotic or anxiolytic abuse, uncomplicated: Secondary | ICD-10-CM | POA: Insufficient documentation

## 2013-12-17 DIAGNOSIS — E1165 Type 2 diabetes mellitus with hyperglycemia: Secondary | ICD-10-CM

## 2013-12-17 DIAGNOSIS — Z8673 Personal history of transient ischemic attack (TIA), and cerebral infarction without residual deficits: Secondary | ICD-10-CM | POA: Insufficient documentation

## 2013-12-17 DIAGNOSIS — F319 Bipolar disorder, unspecified: Secondary | ICD-10-CM | POA: Diagnosis not present

## 2013-12-17 DIAGNOSIS — E039 Hypothyroidism, unspecified: Secondary | ICD-10-CM | POA: Insufficient documentation

## 2013-12-17 DIAGNOSIS — E1149 Type 2 diabetes mellitus with other diabetic neurological complication: Secondary | ICD-10-CM

## 2013-12-17 DIAGNOSIS — M109 Gout, unspecified: Secondary | ICD-10-CM | POA: Insufficient documentation

## 2013-12-17 DIAGNOSIS — Z9119 Patient's noncompliance with other medical treatment and regimen: Secondary | ICD-10-CM | POA: Insufficient documentation

## 2013-12-17 DIAGNOSIS — J45909 Unspecified asthma, uncomplicated: Secondary | ICD-10-CM | POA: Diagnosis not present

## 2013-12-17 DIAGNOSIS — IMO0002 Reserved for concepts with insufficient information to code with codable children: Secondary | ICD-10-CM

## 2013-12-17 DIAGNOSIS — Z91199 Patient's noncompliance with other medical treatment and regimen due to unspecified reason: Secondary | ICD-10-CM | POA: Insufficient documentation

## 2013-12-17 DIAGNOSIS — E1142 Type 2 diabetes mellitus with diabetic polyneuropathy: Secondary | ICD-10-CM | POA: Insufficient documentation

## 2013-12-17 DIAGNOSIS — F17200 Nicotine dependence, unspecified, uncomplicated: Secondary | ICD-10-CM | POA: Insufficient documentation

## 2013-12-17 LAB — CBC
HCT: 40.1 % (ref 36.0–46.0)
Hemoglobin: 13.2 g/dL (ref 12.0–15.0)
MCH: 29.7 pg (ref 26.0–34.0)
MCHC: 32.9 g/dL (ref 30.0–36.0)
MCV: 90.1 fL (ref 78.0–100.0)
PLATELETS: 207 10*3/uL (ref 150–400)
RBC: 4.45 MIL/uL (ref 3.87–5.11)
RDW: 14.9 % (ref 11.5–15.5)
WBC: 7 10*3/uL (ref 4.0–10.5)

## 2013-12-17 LAB — MAGNESIUM: MAGNESIUM: 2.2 mg/dL (ref 1.5–2.5)

## 2013-12-17 LAB — BASIC METABOLIC PANEL
Anion gap: 14 (ref 5–15)
BUN: 18 mg/dL (ref 6–23)
BUN: 16 mg/dL (ref 6–23)
CALCIUM: 8.8 mg/dL (ref 8.4–10.5)
CHLORIDE: 96 meq/L (ref 96–112)
CO2: 23 meq/L (ref 19–32)
CO2: 23 mEq/L (ref 19–32)
CREATININE: 1.14 mg/dL — AB (ref 0.50–1.10)
Calcium: 8.4 mg/dL (ref 8.4–10.5)
Chloride: 86 mEq/L — ABNORMAL LOW (ref 96–112)
Creatinine, Ser: 1.09 mg/dL (ref 0.50–1.10)
GFR, EST AFRICAN AMERICAN: 65 mL/min — AB (ref >90)
GFR, EST NON AFRICAN AMERICAN: 56 mL/min — AB (ref >90)
GLUCOSE: 691 mg/dL — AB (ref 70–99)
Glucose, Bld: 412 mg/dL — ABNORMAL HIGH (ref 70–99)
POTASSIUM: 4.1 meq/L (ref 3.7–5.3)
Potassium: 5.3 mEq/L (ref 3.5–5.3)
SODIUM: 133 meq/L — AB (ref 137–147)
Sodium: 124 mEq/L — ABNORMAL LOW (ref 135–145)

## 2013-12-17 LAB — POCT URINALYSIS DIPSTICK
Bilirubin, UA: NEGATIVE
Ketones, UA: NEGATIVE
Leukocytes, UA: NEGATIVE
NITRITE UA: NEGATIVE
Protein, UA: NEGATIVE
RBC UA: NEGATIVE
UROBILINOGEN UA: 0.2
pH, UA: 6

## 2013-12-17 LAB — GLUCOSE, CAPILLARY
GLUCOSE-CAPILLARY: 348 mg/dL — AB (ref 70–99)
GLUCOSE-CAPILLARY: 419 mg/dL — AB (ref 70–99)
Glucose-Capillary: 553 mg/dL (ref 70–99)
Glucose-Capillary: 435 mg/dL — ABNORMAL HIGH (ref 70–99)

## 2013-12-17 LAB — MRSA PCR SCREENING: MRSA by PCR: NEGATIVE

## 2013-12-17 MED ORDER — INSULIN ASPART 100 UNIT/ML ~~LOC~~ SOLN
8.0000 [IU] | Freq: Once | SUBCUTANEOUS | Status: AC
  Administered 2013-12-17: 8 [IU] via SUBCUTANEOUS

## 2013-12-17 MED ORDER — INSULIN ASPART 100 UNIT/ML ~~LOC~~ SOLN: 5.0000 [IU] | Freq: Once | SUBCUTANEOUS | Status: DC

## 2013-12-17 MED ORDER — SODIUM CHLORIDE 0.9 % IV BOLUS (SEPSIS)
1000.0000 mL | Freq: Once | INTRAVENOUS | Status: AC
  Administered 2013-12-17: 1000 mL via INTRAVENOUS

## 2013-12-17 MED ORDER — LATANOPROST 0.005 % OP SOLN
1.0000 [drp] | Freq: Every day | OPHTHALMIC | Status: DC
  Filled 2013-12-17: qty 2.5

## 2013-12-17 MED ORDER — ALPRAZOLAM 0.5 MG PO TABS: 0.5000 mg | ORAL_TABLET | Freq: Three times a day (TID) | ORAL | Status: DC | PRN

## 2013-12-17 MED ORDER — ALLOPURINOL 300 MG PO TABS
300.0000 mg | ORAL_TABLET | Freq: Every day | ORAL | Status: DC
  Administered 2013-12-18: 300 mg via ORAL
  Filled 2013-12-17: qty 1

## 2013-12-17 MED ORDER — BRINZOLAMIDE 1 % OP SUSP
1.0000 [drp] | Freq: Three times a day (TID) | OPHTHALMIC | Status: DC
  Administered 2013-12-17 – 2013-12-18 (×2): 1 [drp] via OPHTHALMIC
  Filled 2013-12-17: qty 10

## 2013-12-17 MED ORDER — INSULIN ASPART 100 UNIT/ML ~~LOC~~ SOLN
10.0000 [IU] | Freq: Once | SUBCUTANEOUS | Status: AC
  Administered 2013-12-17: 10 [IU] via SUBCUTANEOUS

## 2013-12-17 MED ORDER — TRAMADOL HCL 50 MG PO TABS
50.0000 mg | ORAL_TABLET | Freq: Four times a day (QID) | ORAL | Status: DC | PRN
  Administered 2013-12-18 (×2): 50 mg via ORAL
  Filled 2013-12-17 (×2): qty 1

## 2013-12-17 MED ORDER — SODIUM CHLORIDE 0.9 % IV SOLN
INTRAVENOUS | Status: AC
  Administered 2013-12-17 – 2013-12-18 (×2): via INTRAVENOUS

## 2013-12-17 MED ORDER — LATANOPROST 0.005 % OP SOLN
1.0000 [drp] | Freq: Two times a day (BID) | OPHTHALMIC | Status: DC
  Filled 2013-12-17: qty 2.5

## 2013-12-17 MED ORDER — ONDANSETRON HCL 4 MG PO TABS: 4.0000 mg | ORAL_TABLET | Freq: Four times a day (QID) | ORAL | Status: DC | PRN

## 2013-12-17 MED ORDER — TRAMADOL HCL 50 MG PO TABS: 50.0000 mg | ORAL_TABLET | Freq: Four times a day (QID) | ORAL | Status: DC | PRN

## 2013-12-17 MED ORDER — SERTRALINE HCL 100 MG PO TABS
100.0000 mg | ORAL_TABLET | Freq: Every day | ORAL | Status: DC
  Administered 2013-12-18: 100 mg via ORAL
  Filled 2013-12-17: qty 1

## 2013-12-17 MED ORDER — AMLODIPINE BESYLATE 5 MG PO TABS
5.0000 mg | ORAL_TABLET | Freq: Every day | ORAL | Status: DC
  Administered 2013-12-18: 5 mg via ORAL
  Filled 2013-12-17: qty 1

## 2013-12-17 MED ORDER — PANTOPRAZOLE SODIUM 40 MG PO TBEC
40.0000 mg | DELAYED_RELEASE_TABLET | Freq: Every day | ORAL | Status: DC
  Administered 2013-12-18: 40 mg via ORAL
  Filled 2013-12-17: qty 1

## 2013-12-17 MED ORDER — DIVALPROEX SODIUM 500 MG PO DR TAB
1000.0000 mg | DELAYED_RELEASE_TABLET | Freq: Every day | ORAL | Status: DC
  Administered 2013-12-17: 1000 mg via ORAL
  Filled 2013-12-17 (×2): qty 2

## 2013-12-17 MED ORDER — ALBUTEROL SULFATE (2.5 MG/3ML) 0.083% IN NEBU: 2.5000 mg | INHALATION_SOLUTION | Freq: Four times a day (QID) | RESPIRATORY_TRACT | Status: DC | PRN

## 2013-12-17 MED ORDER — INSULIN GLARGINE 100 UNIT/ML ~~LOC~~ SOLN
35.0000 [IU] | Freq: Every day | SUBCUTANEOUS | Status: DC
  Administered 2013-12-17: 35 [IU] via SUBCUTANEOUS
  Filled 2013-12-17 (×2): qty 0.35

## 2013-12-17 MED ORDER — ONDANSETRON HCL 4 MG/2ML IJ SOLN: 4.0000 mg | Freq: Four times a day (QID) | INTRAMUSCULAR | Status: DC | PRN

## 2013-12-17 MED ORDER — AMLODIPINE BESYLATE 5 MG PO TABS: 5.0000 mg | ORAL_TABLET | Freq: Every day | ORAL | Status: AC

## 2013-12-17 MED ORDER — MOMETASONE FURO-FORMOTEROL FUM 100-5 MCG/ACT IN AERO
2.0000 | INHALATION_SPRAY | Freq: Two times a day (BID) | RESPIRATORY_TRACT | Status: DC
  Administered 2013-12-18: 2 via RESPIRATORY_TRACT
  Filled 2013-12-17: qty 8.8

## 2013-12-17 MED ORDER — ENOXAPARIN SODIUM 40 MG/0.4ML ~~LOC~~ SOLN
40.0000 mg | SUBCUTANEOUS | Status: DC
  Administered 2013-12-17: 40 mg via SUBCUTANEOUS
  Filled 2013-12-17 (×2): qty 0.4

## 2013-12-17 MED ORDER — ARIPIPRAZOLE 5 MG PO TABS
5.0000 mg | ORAL_TABLET | Freq: Every morning | ORAL | Status: DC
  Administered 2013-12-18: 5 mg via ORAL
  Filled 2013-12-17: qty 1

## 2013-12-17 MED ORDER — LEVOTHYROXINE SODIUM 75 MCG PO TABS
75.0000 ug | ORAL_TABLET | Freq: Every day | ORAL | Status: DC
Start: 2013-12-18 — End: 2013-12-18
  Administered 2013-12-18: 75 ug via ORAL
  Filled 2013-12-17 (×2): qty 1

## 2013-12-17 MED ORDER — TRAZODONE HCL 50 MG PO TABS
50.0000 mg | ORAL_TABLET | Freq: Every day | ORAL | Status: DC
  Administered 2013-12-17: 50 mg via ORAL
  Filled 2013-12-17 (×2): qty 1

## 2013-12-17 MED ORDER — INSULIN GLARGINE 100 UNIT/ML ~~LOC~~ SOLN: 35.0000 [IU] | Freq: Every day | SUBCUTANEOUS | Status: DC

## 2013-12-17 MED ORDER — ASPIRIN EC 81 MG PO TBEC
81.0000 mg | DELAYED_RELEASE_TABLET | Freq: Every day | ORAL | Status: DC
  Administered 2013-12-18: 81 mg via ORAL
  Filled 2013-12-17: qty 1

## 2013-12-17 MED ORDER — LISINOPRIL 40 MG PO TABS
40.0000 mg | ORAL_TABLET | Freq: Every day | ORAL | Status: DC
  Administered 2013-12-18: 40 mg via ORAL
  Filled 2013-12-17: qty 1

## 2013-12-17 MED ORDER — GABAPENTIN 300 MG PO CAPS
300.0000 mg | ORAL_CAPSULE | Freq: Three times a day (TID) | ORAL | Status: DC
  Administered 2013-12-17 – 2013-12-18 (×2): 300 mg via ORAL
  Filled 2013-12-17 (×4): qty 1

## 2013-12-17 MED ORDER — TIMOLOL HEMIHYDRATE 0.5 % OP SOLN
1.0000 [drp] | Freq: Three times a day (TID) | OPHTHALMIC | Status: DC
  Filled 2013-12-17: qty 5

## 2013-12-17 MED ORDER — ATORVASTATIN CALCIUM 80 MG PO TABS
80.0000 mg | ORAL_TABLET | Freq: Every day | ORAL | Status: DC
  Administered 2013-12-18: 80 mg via ORAL
  Filled 2013-12-17: qty 1

## 2013-12-17 NOTE — Progress Notes (Signed)
Report was called to Nurse on [facility].  Pt transported via wheelchair to [place], RN 12/17/2013 6:39 PM

## 2013-12-17 NOTE — Assessment & Plan Note (Addendum)
Lab Results  Component Value Date   HGBA1C 14.8* 12/07/2013   HGBA1C 15.5* 09/03/2013   HGBA1C 15.3* 07/20/2013     Assessment: Diabetes control: poor control (HgbA1C >9%) Progress toward A1C goal:  unchanged Comments:  -Symptoms likely related to poor glycemic control since discharge.  -H/o non-adherence though inconsistent with her report of taking insulin and checking sugars -She will require close follow-up for better control.  Plan: Medications:  Lantus 34 units at bedtime; Novolog 6 units before meals Home glucose monitoring: Frequency: 2 times a day Timing: before breakfast;before meals Instruction/counseling given: reminded to bring blood glucose meter & log to each visit Educational resources provided:   Self management tools provided:   Other plans:   -Check BMET: Crt 1.14 (possible dehydration); Na 124 (likely pseudohyperglycemia),   -Give Novolog 8 units now and recheck sugar-->now 553  -Plan to admit tonight for observation to help control her sugar with IVF & insulin with glycemic target <400 -Discharge her on Lantus 38 & Novolog 8 -Advise her to check sugars in the morning and another time during the day -Plan to f/u next week

## 2013-12-17 NOTE — Progress Notes (Signed)
   Subjective:    Patient ID: [name], female    DOB: January 27, 1956, 58 y.o.   MRN: [id number]  HPI Ms. Lukasiewicz is a 58 year old female with HTN, COPD, poorly controlled DM2, depression/anxiety, bipolar disorder, chronic pain, h/o polysubstance abuse (cocaine & benzos) who presents today for follow-up for hospitalization.  She was recently hospitalized (7/12-7/13) for HHS.   She reports feeling terrible today (dizzy, headache, weakness in her right arm and both legs). Her sugar has been running >600. She ate breakfast (toast, bacon, boiled egg) and ate lunch today (1/2 tuna sandwich). She states that she has been taking her medicine regularly for the past month: 34 units of Lantus at bedtime along with 6 units of Novolog prior to meals.  She checks her sugars 2-3 times a day; her log shows morning sugars ranging 428-544 with several "high" readings.   She eat four meals a day:  7-8AM breakfast: boiled eggs, bacon toast 12-1PM lunch: chef's salad, leftovers 3-4PM snack: sandwich ([place] or ham), fruit 6-7PM dinner: baked chicken, rice, corn  Her weakness in the right arm and legs have been worsening over the last 2-3 weeks. Her right hand is becoming numb. Her legs are weak to the point she is wobbling more often while walking. Her neuropathic pain at night keeps her up and is not controlled on gabapentin 300mg  TID and tramadol 50mg  q6h prn for pain.   Otherwise, she denies vomiting, nausea.   She needs refills for amlodipine 5mg , tramadol 50mg , and albuterol nebulizer solution.   Polysubstance abuse: At her last hospitalization, her UDS was cocaine+. She admits to using it about 1-2 weeks ago but has now started going to [employer]. Pain is the biggest trigger for her, and she is trying to stick with her prescribed medications to get better control.   Review of Systems  Constitutional: Positive for fatigue.  Gastrointestinal: Negative for nausea, vomiting and diarrhea.  Neurological:  Positive for weakness, numbness and headaches.  All other systems reviewed and are negative.      Objective:   Physical Exam  Constitutional: She is oriented to person, place, and time. She appears well-developed and well-nourished. No distress.  HENT:  Head: Normocephalic and atraumatic.  Eyes: Conjunctivae are normal. Pupils are equal, round, and reactive to light.  Cardiovascular: Normal rate, regular rhythm and normal heart sounds.  Exam reveals no gallop and no friction rub.   No murmur heard. Pulmonary/Chest: Effort normal. No respiratory distress.  Difficult to auscultate air bilaterally but could not appreciate wheezing, crackles, or rales.  Musculoskeletal:  Grip strength R > L.   Neurological: She is alert and oriented to person, place, and time. No cranial nerve deficit.  Sensation decreased to light touch L > R.  Skin: Skin is warm and dry. She is not diaphoretic.  Psychiatric: She has a normal mood and affect. Her behavior is normal.         Assessment & Plan:

## 2013-12-17 NOTE — Assessment & Plan Note (Signed)
Assessment -She appears motivated given her willingness to go to NA meetings and to try to use her pain medicine versus cocaine. -She shows some insight into why she uses it which is her pain.  Plan -Continue reassessing control of her neuropathic pain at each visit through glycemic control and titrating up medicine

## 2013-12-17 NOTE — Progress Notes (Signed)
RECEIVED PATIENT AND ORIENTED TO ROOM. CHG BATH COMPLETED.

## 2013-12-17 NOTE — H&P (Signed)
Date: 12/17/2013               Patient Name:  [name] MRN: [id number]  DOB: [birth year] Age / Sex: 58 y.o., female   PCP: Charlott Rakes, MD         Medical Service: Internal Medicine Teaching Service         Attending Physician: Dr. Madilyn Fireman, MD    First Contact: Dr. Ethelene Hal Pager: [phone]  Second Contact: Dr. Algis Liming Pager: [phone]       After Hours (After 5p/  First Contact Pager: [phone]  weekends / holidays): Second Contact Pager: [phone]   Chief Complaint:   History of Present Illness: Ms. Travaglini is a 58 year old female with HTN, COPD, poorly controlled DM2, depression/anxiety, bipolar disorder, chronic pain, h/o polysubstance abuse (cocaine & benzos) who presents to the ED from the [facility] clinic. According to her clinic note, she reported feeling dizzy, with a headache, and  weakness. Her sugar has been running >600. She states that she has been taking her medicine regularly for the past month: 36 units of Lantus at bedtime along with 6 units of Novolog prior to meals. She checks her sugars 2-3 times a day; her log shows morning sugars ranging 428-544 with several "high" readings.  She was recently hospitalized (7/12-7/13) for HHS d/t diet and medical noncompliance.   Pt promises that she has been taking her medications as prescribed and eating how she is supposed to, but after further review she has not been compliant with her diet. In fact, pt was found eating orange sherbert and bojangles chicken and fries.   Meds: Current Facility-Administered Medications  Medication Dose Route Frequency Provider Last Rate Last Dose  . 0.9 %  sodium chloride infusion   Intravenous Continuous Corky Sox, MD 150 mL/hr at 12/17/13 2046    . albuterol (PROVENTIL) (2.5 MG/3ML) 0.083% nebulizer solution 2.5 mg  2.5 mg Inhalation Q6H PRN Corky Sox, MD      . Derrill Memo ON 12/18/2013] allopurinol (ZYLOPRIM) tablet 300 mg  300 mg Oral Daily Corky Sox, MD      . ALPRAZolam Duanne Moron) tablet 0.5  mg  0.5 mg Oral TID PRN Corky Sox, MD      . Derrill Memo ON 12/18/2013] amLODipine (NORVASC) tablet 5 mg  5 mg Oral Daily Corky Sox, MD      . Derrill Memo ON 12/18/2013] ARIPiprazole (ABILIFY) tablet 5 mg  5 mg Oral q morning - 10a Corky Sox, MD      . Derrill Memo ON 12/18/2013] aspirin EC tablet 81 mg  81 mg Oral Daily Corky Sox, MD      . Derrill Memo ON 12/18/2013] atorvastatin (LIPITOR) tablet 80 mg  80 mg Oral Daily Corky Sox, MD      . brinzolamide (AZOPT) 1 % ophthalmic suspension 1 drop  1 drop Both Eyes TID Alexa Richardson, MD      . divalproex (DEPAKOTE) DR tablet 1,000 mg  1,000 mg Oral QHS Corky Sox, MD   1,000 mg at 12/17/13 2200  . enoxaparin (LOVENOX) injection 40 mg  40 mg Subcutaneous Q24H Corky Sox, MD   40 mg at 12/17/13 2101  . gabapentin (NEURONTIN) capsule 300 mg  300 mg Oral TID Corky Sox, MD   300 mg at 12/17/13 2201  . insulin glargine (LANTUS) injection 35 Units  35 Units Subcutaneous QHS Corky Sox, MD   35 Units at 12/17/13 2227  . [  START ON 12/18/2013] latanoprost (XALATAN) 0.005 % ophthalmic solution 1 drop  1 drop Both Eyes QHS Madilyn Fireman, MD      . Derrill Memo ON 12/18/2013] levothyroxine (SYNTHROID, LEVOTHROID) tablet 75 mcg  75 mcg Oral QAC breakfast Corky Sox, MD      . Derrill Memo ON 12/18/2013] lisinopril (PRINIVIL,ZESTRIL) tablet 40 mg  40 mg Oral Daily Corky Sox, MD      . mometasone-formoterol [facility]) 100-5 MCG/ACT inhaler 2 puff  2 puff Inhalation BID Corky Sox, MD      . ondansetron [facility]) tablet 4 mg  4 mg Oral Q6H PRN Corky Sox, MD       Or  . ondansetron [facility]) injection 4 mg  4 mg Intravenous Q6H PRN Corky Sox, MD      . Derrill Memo ON 12/18/2013] pantoprazole (PROTONIX) EC tablet 40 mg  40 mg Oral Daily Corky Sox, MD      . Derrill Memo ON 12/18/2013] sertraline (ZOLOFT) tablet 100 mg  100 mg Oral Daily Corky Sox, MD      . sodium chloride 0.9 % bolus 1,000 mL  1,000 mL Intravenous Once Corky Sox, MD      . Derrill Memo ON 12/18/2013] timolol  (BETIMOL) 0.5 % ophthalmic solution 1 drop  1 drop Both Eyes TID Corky Sox, MD      . traMADol Veatrice Bourbon) tablet 50 mg  50 mg Oral Q6H PRN Corky Sox, MD      . traZODone (DESYREL) tablet 50 mg  50 mg Oral QHS Corky Sox, MD        Allergies: Allergies as of 12/17/2013 - Review Complete 12/17/2013  Allergen Reaction Noted  . Metformin Diarrhea 01/02/2013  . Acetaminophen Nausea And Vomiting 09/02/2013  . Hydrocodone-acetaminophen Hives    Past Medical History  Diagnosis Date  . Asthma     past 10 years  . Diabetes mellitus     past 5 years  . Glaucoma     dx 06/2011-[facility]  . Hypertension   . Hyperlipidemia   . Hyperthyroidism   . History of TIA (transient ischemic attack)     01/06/2011, x2  . Pancreatitis   . Gout   . Depression   . Renal disorder     chronic kidney disease, stage 3  . Chronic pain   . Glaucoma   . Arthritis     djd -lower back, left knee  . Degenerative disc disease   . Bipolar disorder   . Gastritis   . COPD (chronic obstructive pulmonary disease)   . Schizophrenia   . 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 Fatty liver     mild CT 06/2012  . DKA (diabetic ketoacidoses) 07/21/2013  . Stroke     TIAs  . Esophageal ring, acquired 08/12/2013  . Cocaine abuse   . Chronic abdominal pain    Past Surgical History  Procedure Laterality Date  . Abdominal hysterectomy  1990    still has ovaries   . Colonoscopy    . Refractive surgery      both eyes   Family History  Problem Relation Age of Onset  . Arthritis Mother   . Diabetes Mother   . Hypertension Mother   . Kidney disease Mother   . Heart disease Mother     died 57  . Heart disease      mother & father  . Hypertension      mother &  father  . Colon cancer Neg Hx   . Prostate cancer Neg Hx   . Breast cancer Neg Hx   . Stomach cancer Neg Hx   . Esophageal cancer Neg Hx   . Migraines Neg Hx   . Hypertension Sister   . Hypertension Brother   . Kidney  disease Brother    History   Social History  . Marital Status: Single    Spouse Name: N/A    Number of Children: 3  . Years of Education: college   Occupational History  . Disability    Social History Main Topics  . Smoking status: Current Every Day Smoker -- 0.30 packs/day for 30 years    Types: Cigarettes  . Smokeless tobacco: Never Used     Comment: smokes 2 -3 cigs daily/uses patches.  . Alcohol Use: No     Comment: denied alcohol at this time  . Drug Use: Yes    Special: Cocaine     Comment: "a little'  . Sexual Activity: Not on file     Comment: hysterectomy   Other Topics Concern  . Not on file   Social History Narrative   On disability   Single 3 sons   3 coffee/day   3 regular pepsi/day   Updated 08/11/2013                   Review of Systems: General: Denies fever, chills, diaphoresis, appetite change and fatigue.  Respiratory: Occasional productive cough with clear or yellowish sputum, Denies SOB, DOE, chest tightness, and wheezing.   Cardiovascular: Admits to chest discomfort, but Denies chest pain and palpitations.  Gastrointestinal: Denies nausea, vomiting, abdominal pain, diarrhea, constipation, blood in stool and abdominal distention.  Genitourinary: Denies dysuria, urgency, frequency, hematuria, and flank pain. Endocrine: Denies hot or cold intolerance, admits to polyuria, and polydipsia. Musculoskeletal: Denies myalgias, back pain, joint swelling, arthralgias and gait problem.  Skin: Denies pallor, rash and wounds.  Neurological: Denies dizziness, seizures, syncope, weakness, lightheadedness, numbness and headaches.  Psychiatric/Behavioral: Denies mood changes, confusion, nervousness, sleep disturbance and agitation.  Physical Exam: Filed Vitals:   12/17/13 1902 12/17/13 2000 12/17/13 2256  BP: 139/72 125/67 121/72  Pulse: 50 67 64  Temp: 97.5 F (36.4 C)  97.8 F (36.6 C)  TempSrc: Oral  Oral  Resp: 26 17 20   Height: 5' 1.81" (1.57 m)     Weight: 193 lb 5.5 oz (87.7 kg)    SpO2: 98% 99% 97%   General: Vital signs reviewed.  Patient is a well-developed and well-nourished, in no acute distress and cooperative with exam.  Head: Tender over maxillary and frontal sinuses on the right. Normocephalic and atraumatic. Eyes: PERRL, EOMI, conjunctivae normal, No scleral icterus.  Neck: Supple, trachea midline, normal ROM, No JVD, masses, thyromegaly, or carotid bruit present.  Cardiovascular: RRR, S1 normal, S2 normal, no murmurs, gallops, or rubs. Pulmonary/Chest:CTA bilaterally, no wheezes, rales, or rhonchi. Abdominal: Soft, non-tender, non-distended, BS +, no masses, organomegaly, or guarding present.  Musculoskeletal: No joint deformities, erythema, or stiffness, ROM full and nontender. Extremities: No lower extremity edema bilaterally,  pulses symmetric and intact bilaterally. No cyanosis or clubbing. Neurological: A&O x3, Strength is normal and symmetric bilaterally, cranial nerve II-XII are grossly intact, no focal motor deficit, sensory intact to light touch bilaterally.  Skin: Warm, dry and intact. No rashes or erythema. Psychiatric: Normal mood and affect. speech and behavior is normal. Cognition and memory are normal.   Lab results: Basic Metabolic Panel:  Recent Labs  12/17/13 1519 12/17/13 2200  NA 124* 133*  K 5.3 4.1  CL 86* 96  CO2 23 23  GLUCOSE 691* 412*  BUN 18 16  CREATININE 1.14* 1.09  CALCIUM 8.8 8.4  MG  --  2.2   CBG:  Recent Labs  12/17/13 1719 12/17/13 1908 12/17/13 2035 12/17/13 2224  GLUCAP 553* 348* 435* 419*    Other results: EKG: normal EKG, normal sinus rhythm, unchanged from previous tracings, sinus bradycardia.  Assessment & Plan by Problem:  Hyperosmolar Hyperglycemic State: Pt has known history of Type II DM with neuropathy and history of hyperglycemic events from medical non-compliance and inappropriate diet. Pt presents from the clinic for hyperglycemia. Blood glucose 691  on admission. Na 124, K 5.3, Cl 86, bicarb 23, and an anion gap of 15. No urine ketones. WBC 7.0. Hyperglycemia likely 2/2 inappropriate diet or drug use. Pt admits to cocaine use 1 week ago. Pt adamantly states she has been taking her medication as prescribed. She even sets an alarm so that she does not forget. Pts diet is inappropriate for her uncontrolled diabetes. It is unclear whether the patient truly does not understand what she should be eating or whether she is just noncompliant. She was eating a sherbet and had a bag of [employer] in her hospital room. Last hemoglobin A1C was 14.8 from 12/07/2013. Pt takes Novolog 6 units TID before meals and Lantus 36 units at bedtime. Pt was given Novolog 8 units in the clinic and 10 units on admission. She was given her 35 units of lantus at bedtime.  -NS 125 mL/hr -Lantus 35 units QHS -Restart insulin -CBG monitoring Q2H -Neurontin 300 mg TID for neuropathy -NPO  -I/Os  -Consult to diabetes coordinator  COPD: Pt pulse ox is 97% on room air. No complaint of shortness of breath. Takes albuterol 2 puffs Q6H prn wheezing and dulera 2 puffs BID.  -Continue albuterol 2 puffs Q6H prn wheezing and dulera 2 puffs BID   CKD stage 3: Cr 1.09 and GFR 65. Pt is on lisinopril 40 mg daily at home.  -Continue lisinopril 40 mg   Hypothyroidism: Most recent TSH 93.15 on 10/27/2013. Most recent free T4 0.36 on 07/21/13. On synthroid 75 mcg daily at home.  -Stable  -Continue synthroid 75 mcg daily   TIA x 2 in 2012: No complaints of weakness or neurologic deficits. No focal deficits on exam. On amlodipine 5 mg daily, aspirin 81 mg daily, atorvastatin 80 mg daily, and lisinopril 40 mg daily at home.  -Continue amlodipine 5 mg daily, aspirin 81 mg daily, atorvastatin 80 mg daily, and lisinopril 40 mg daily   Bipolar disorder with depression and anxiety: On xanax 0.5 mg TID daily, depakote 1000 mg daily, and sertraline 100 mg daily. Valproic acid level 45.1.  -Continue  xanax 0.5 mg TID daily, depakote1000 mg daily, and sertraline 100 mg daily  -Stable   Substance abuse: Pt admits to cocaine use. Last use was approximately 1 week ago. She denies any alcohol use. Pt has been reportedly going to NA meeting twice a day. -Urine Drug Screen  -Consult to social work  Gout: Last gout flare was less than 1 year ago. On allopurinol 300 mg daily and colchicine 0.6 mg daily prn gout flare.  -Stable  -Continue allopurinol 300 mg  -hold colchicine 0.6 mg daily prn gout flare   DVT/PE ppx: Lovenox 40 mg SQ daily  Dispo: Disposition is deferred at this time, awaiting improvement of current  medical problems. Anticipated discharge in approximately 1-2 day(s).   The patient does not have a current PCP (Charlott Rakes, MD) and does not need an [facility] hospital follow-up appointment after discharge.  The patient does not have transportation limitations that hinder transportation to clinic appointments.  Signed: Osa Craver, DO PGY-1 Internal Medicine Resident Pager # [phone] 12/17/2013 11:25 PM

## 2013-12-18 DIAGNOSIS — E1101 Type 2 diabetes mellitus with hyperosmolarity with coma: Secondary | ICD-10-CM

## 2013-12-18 DIAGNOSIS — E1149 Type 2 diabetes mellitus with other diabetic neurological complication: Secondary | ICD-10-CM | POA: Diagnosis not present

## 2013-12-18 LAB — BASIC METABOLIC PANEL
ANION GAP: 14 (ref 5–15)
Anion gap: 13 (ref 5–15)
BUN: 14 mg/dL (ref 6–23)
BUN: 15 mg/dL (ref 6–23)
CO2: 20 mEq/L (ref 19–32)
CO2: 23 mEq/L (ref 19–32)
Calcium: 8 mg/dL — ABNORMAL LOW (ref 8.4–10.5)
Calcium: 8.1 mg/dL — ABNORMAL LOW (ref 8.4–10.5)
Chloride: 100 mEq/L (ref 96–112)
Chloride: 101 mEq/L (ref 96–112)
Creatinine, Ser: 0.97 mg/dL (ref 0.50–1.10)
Creatinine, Ser: 1 mg/dL (ref 0.50–1.10)
GFR calc non Af Amer: 62 mL/min — ABNORMAL LOW (ref >90)
GFR, EST AFRICAN AMERICAN: 72 mL/min — AB (ref >90)
GFR, EST AFRICAN AMERICAN: 75 mL/min — AB (ref >90)
GFR, EST NON AFRICAN AMERICAN: 65 mL/min — AB (ref >90)
Glucose, Bld: 360 mg/dL — ABNORMAL HIGH (ref 70–99)
Glucose, Bld: 366 mg/dL — ABNORMAL HIGH (ref 70–99)
POTASSIUM: 4.3 meq/L (ref 3.7–5.3)
POTASSIUM: 4.4 meq/L (ref 3.7–5.3)
SODIUM: 135 meq/L — AB (ref 137–147)
SODIUM: 136 meq/L — AB (ref 137–147)

## 2013-12-18 LAB — RAPID URINE DRUG SCREEN, HOSP PERFORMED
AMPHETAMINES: NOT DETECTED
Barbiturates: NOT DETECTED
Benzodiazepines: NOT DETECTED
Cocaine: POSITIVE — AB
Opiates: NOT DETECTED
TETRAHYDROCANNABINOL: NOT DETECTED

## 2013-12-18 LAB — GLUCOSE, CAPILLARY
Glucose-Capillary: 129 mg/dL — ABNORMAL HIGH (ref 70–99)
Glucose-Capillary: 212 mg/dL — ABNORMAL HIGH (ref 70–99)
Glucose-Capillary: 314 mg/dL — ABNORMAL HIGH (ref 70–99)
Glucose-Capillary: 330 mg/dL — ABNORMAL HIGH (ref 70–99)
Glucose-Capillary: 358 mg/dL — ABNORMAL HIGH (ref 70–99)
Glucose-Capillary: 365 mg/dL — ABNORMAL HIGH (ref 70–99)

## 2013-12-18 MED ORDER — INSULIN ASPART 100 UNIT/ML ~~LOC~~ SOLN
0.0000 [IU] | SUBCUTANEOUS | Status: DC
  Administered 2013-12-18: 5 [IU] via SUBCUTANEOUS

## 2013-12-18 MED ORDER — INSULIN ASPART 100 UNIT/ML ~~LOC~~ SOLN: 0.0000 [IU] | Freq: Every day | SUBCUTANEOUS | Status: DC

## 2013-12-18 MED ORDER — GABAPENTIN 300 MG PO CAPS: ORAL_CAPSULE | ORAL | Status: DC

## 2013-12-18 MED ORDER — INSULIN ASPART 100 UNIT/ML ~~LOC~~ SOLN: 0.0000 [IU] | Freq: Three times a day (TID) | SUBCUTANEOUS | Status: DC

## 2013-12-18 MED ORDER — INSULIN ASPART 100 UNIT/ML ~~LOC~~ SOLN
0.0000 [IU] | Freq: Three times a day (TID) | SUBCUTANEOUS | Status: DC
  Administered 2013-12-18: 2 [IU] via SUBCUTANEOUS
  Administered 2013-12-18: 11 [IU] via SUBCUTANEOUS

## 2013-12-18 MED ORDER — INSULIN GLARGINE 100 UNIT/ML ~~LOC~~ SOLN: 36.0000 [IU] | Freq: Every day | SUBCUTANEOUS | Status: DC

## 2013-12-18 MED ORDER — SODIUM CHLORIDE 0.9 % IV BOLUS (SEPSIS)
500.0000 mL | Freq: Once | INTRAVENOUS | Status: AC
  Administered 2013-12-18: 500 mL via INTRAVENOUS

## 2013-12-18 MED ORDER — GLUCOSE BLOOD VI STRP: ORAL_STRIP | Status: DC

## 2013-12-18 MED ORDER — INSULIN ASPART 100 UNIT/ML ~~LOC~~ SOLN: 6.0000 [IU] | Freq: Three times a day (TID) | SUBCUTANEOUS | Status: DC

## 2013-12-18 MED ORDER — INSULIN ASPART 100 UNIT/ML ~~LOC~~ SOLN
8.0000 [IU] | Freq: Once | SUBCUTANEOUS | Status: AC
  Administered 2013-12-18: 8 [IU] via SUBCUTANEOUS

## 2013-12-18 NOTE — H&P (Signed)
PROGRESS NOTE MEDICINE TEACHING ATTENDING   Day 1 of stay Patient name: [name]   Medical record number: [id number] Date of birth: 09/14/1955     Key points in HPI and pertinent exam findings                        [name] is a 58 year old lady with hypertension, extremely poorly controlled DM2 (last A1c 14.8 in 7/15), anxiety, bipoalr disorder and history of polysubstance abuse. She is frequently admitted to the hospital due to severely elevated blood sugars, mostly due to non-compliance on the part of the patient with either herdiet or her insulin regimen. She was admitted from clinic yesterday with similar presentation. She also complained about feeling dizzy and weak. She complained of weakness in her right arm and both legs.     I met with her this morning. She has been administered her home dose of insulin and her sugars have been well controlled on that. She feels less dizzy today. On asking her more about her dizziness she claims that she feels dizzy off and on, however has been feeling more dizzy since her last hospital discharge. She denies falling down, syncope, presyncope, positional nature of the dizziness. She denies hearing problems, or ear pain. She denies visual problems. She says she "feels funny" while walking for the past few weeks. She also complains of numbness and tingling in her legs and right arm, which has been going on for weeks - off and on. She denies chest pain, however endorses a feeling of bloated discomfort in her abdomen. She denies nausea and vomiting. She complains of muscle cramping in her calves especially at rest. She has some usual back pain. She denies any urinary symptoms. She denies any bowel or bladder accidents.   On neurological exam: She has intact cranial nerves, no facial droop, she is alert and oriented and makes comprehensible conversation. I found 5/5 motor strength in all extremities, however may be there could have been 4+to 5/5 in her right  arm. She has decreased sensation in her right arm upto elbow. Left arm has normal sensation to touch. She has no neck, cervical spine or lumbar spine tenderness. She was able to stand without assistance and complained of very little dizziness on standing. Pronator drift was negative. Finger nose test was negative. Plantars were down-going. Was not able to elicit knee or ankle jerk. She has no carotid bruit. She has regular heart rate and rhythm, with no murmurs.   Assessment and Plan                                                                        Uncontrolled DM2 - In my opinion this is a case of extremely poorly controlled DM2 on insulin secondary to non-compliance. Currently, blood sugars are better controlled on home dose of insulin. Patient counseled for non-compliance.   Recent Labs Lab 12/18/13 0159 12/18/13 0412 12/18/13 0609 12/18/13 0750 12/18/13 1143  GLUCAP 365* 330* 212* 129* 314*    Dizziness - This could be multifactorial. She could feel dizzy when she keeps lower sugars because of relative hypoglycemia, she could also feel dizzy when she has very high sugars  like yesterday because she is dehydrated. She feels good today according to my visit with her. Neurologically, I do not find any focal deficits.   We can do a PT consult for home health services purposes and see if she is a fall risk. She was able to support herself and stand without my assistance.   She does not have orthostatic hypotension, on the contrary she has orthostatic hypertension, which could indicate neuropathy too.   She has bradycardia stable regular rate of 50-60 bpm, however we could refer her to cardiology as outpatient for an event monitor to have further work up on the dizziness.   She also takes xanax 0.5 mg three times a day which might be contributing to her dizziness, and thus we would recommend to try decreasing that to twice a day and wean her off it and substitute it with long acting  klonipin if possible, however we would defer it to the PCP. Other contributing medicine is tramadol.   Right arm numbness and subjective weakness/small motor function dysfunction - I am not sure if the arm is really weak, the patient says she is having trouble holding small objects and her dinner plate for example, and this could be a sign of small motor fiber neuropathy, however it is unilateral which is uncommon in diabetes.   Increase gabapentin as below.   Consider neurology opinion as outpatient for assymetric neuropathy.  Serum vitamin B12 and Vitamin D 25 hydroxy levels.    Leg weakness and cramps - Peripheral neuropathy due to diabetes. We can go up on her gabapentin at night, and make it 300-300-600.   Abdominal/epigastric discomfort - history of Schatzki ring. EGD done in March 2015 - chronic gastritis. Also could be gastroparetic features. Already on pantoprazole 40 mg.   Based on my exam and review of chart, we can discharge the patient today with appropriate follow ups as early as possible.   I have discussed the care of this patient with my IM team residents - Drs Algis Liming and Ethelene Hal. Please see the resident note for details.  Madilyn Fireman 12/18/2013, 12:24 PM.

## 2013-12-18 NOTE — Progress Notes (Signed)
Subjective: [name] had NAEON. She said she has had this dizziness for about a week but it was much worse yesterday and today it is better. She also said she felt jittery but less so now. She denies chest pain, palpitations, SOB, loss of consciousness, or nausea.   Objective: Vital signs in last 24 hours: Filed Vitals:   12/18/13 0600 12/18/13 0750 12/18/13 0756 12/18/13 1145  BP: 101/50 115/63  113/70  Pulse: 59 57  62  Temp:  97.9 F (36.6 C)  97.7 F (36.5 C)  TempSrc:  Oral  Oral  Resp: 18 20  18   Height:      Weight:      SpO2: 96% 98% 98% 98%   Weight change:   Intake/Output Summary (Last 24 hours) at 12/18/13 1326 Last data filed at 12/18/13 0915  Gross per 24 hour  Intake    240 ml  Output    400 ml  Net   -160 ml   BP 113/70  Pulse 62  Temp(Src) 97.7 F (36.5 C) (Oral)  Resp 18  Ht 5' 1.81" (1.57 m)  Wt 193 lb 5.5 oz (87.7 kg)  BMI 35.58 kg/m2  SpO2 98%  General Appearance:    Alert, cooperative, no distress, appears stated age  Head:    Normocephalic, without obvious abnormality, atraumatic, PERRL, EOMI, MMM  Neck:   No carotid bruit or JVD  Back:     Symmetric, no curvature  Lungs:     Clear to auscultation bilaterally, respirations unlabored  Chest Wall:    No tenderness or deformity   Heart:    Regular rate and rhythm, S1 and S2 normal, no murmur, rub   or gallop  Abdomen:     Soft, non-tender, bowel sounds active all four quadrants,    no masses, no organomegaly  Extremities:   Extremities normal, atraumatic, no cyanosis or edema  Pulses:   2+ and symmetric radial and DP pulses  Neurologic:   CNII-XII intact, 5/5 strength in U and LE, no Babinksi, patient reports "different" sensation to light touch in RLE that does not follow a dermatomal distribution, normal biceps and patellar reflexes   Lab Results: Basic Metabolic Panel:  Recent Labs Lab 12/17/13 2200 12/18/13 0005 12/18/13 0231  NA 133* 136* 135*  K 4.1 4.4 4.3  CL 96 100 101    CO2 23 23 20   GLUCOSE 412* 366* 360*  BUN 16 15 14   CREATININE 1.09 1.00 0.97  CALCIUM 8.4 8.1* 8.0*  MG 2.2  --   --    Liver Function Tests: No results found for this basename: AST, ALT, ALKPHOS, BILITOT, PROT, ALBUMIN,  in the last 168 hours No results found for this basename: LIPASE, AMYLASE,  in the last 168 hours No results found for this basename: AMMONIA,  in the last 168 hours CBC:  Recent Labs Lab 12/17/13 2200  WBC 7.0  HGB 13.2  HCT 40.1  MCV 90.1  PLT 207   Cardiac Enzymes: No results found for this basename: CKTOTAL, CKMB, CKMBINDEX, TROPONINI,  in the last 168 hours BNP: No results found for this basename: PROBNP,  in the last 168 hours D-Dimer: No results found for this basename: DDIMER,  in the last 168 hours CBG:  Recent Labs Lab 12/18/13 0022 12/18/13 0159 12/18/13 0412 12/18/13 0609 12/18/13 0750 12/18/13 1143  GLUCAP 358* 365* 330* 212* 129* 314*   Hemoglobin A1C: No results found for this basename: HGBA1C,  in the last  168 hours Fasting Lipid Panel: No results found for this basename: CHOL, HDL, LDLCALC, TRIG, CHOLHDL, LDLDIRECT,  in the last 168 hours Thyroid Function Tests: No results found for this basename: TSH, T4TOTAL, FREET4, T3FREE, THYROIDAB,  in the last 168 hours Coagulation: No results found for this basename: LABPROT, INR,  in the last 168 hours Anemia Panel: No results found for this basename: VITAMINB12, FOLATE, FERRITIN, TIBC, IRON, RETICCTPCT,  in the last 168 hours Urine Drug Screen: Drugs of Abuse     Component Value Date/Time   LABOPIA NONE DETECTED 12/18/2013 0204   LABOPIA NEG 01/23/2013 1507   COCAINSCRNUR POSITIVE* 12/18/2013 0204   COCAINSCRNUR NEG 01/23/2013 1507   LABBENZ NONE DETECTED 12/18/2013 0204   LABBENZ NEG 01/23/2013 1507   AMPHETMU NONE DETECTED 12/18/2013 0204   THCU NONE DETECTED 12/18/2013 0204   LABBARB NONE DETECTED 12/18/2013 0204   LABBARB NEG 01/23/2013 1507    Alcohol Level: No results found  for this basename: ETH,  in the last 168 hours Urinalysis:  Recent Labs Lab 12/17/13 1540  BILIRUBINUR negative  PROTEINUR negative  UROBILINOGEN 0.2  NITRITE negative  LEUKOCYTESUR Negative    Micro Results: Recent Results (from the past 240 hour(s))  MRSA PCR SCREENING     Status: None   Collection Time    12/17/13  7:13 PM      Result Value Ref Range Status   MRSA by PCR NEGATIVE  NEGATIVE Final   Comment:            The GeneXpert MRSA Assay (FDA     approved for NASAL specimens     only), is one component of a     comprehensive MRSA colonization     surveillance program. It is not     intended to diagnose MRSA     infection nor to guide or     monitor treatment for     MRSA infections.   Studies/Results: No results found. Medications: I have reviewed the patient's current medications. Scheduled Meds: . allopurinol  300 mg Oral Daily  . amLODipine  5 mg Oral Daily  . ARIPiprazole  5 mg Oral q morning - 10a  . aspirin EC  81 mg Oral Daily  . atorvastatin  80 mg Oral Daily  . brinzolamide  1 drop Both Eyes TID  . divalproex  1,000 mg Oral QHS  . enoxaparin (LOVENOX) injection  40 mg Subcutaneous Q24H  . gabapentin  300 mg Oral TID  . insulin aspart  0-15 Units Subcutaneous TID WC  . insulin glargine  35 Units Subcutaneous QHS  . latanoprost  1 drop Both Eyes QHS  . levothyroxine  75 mcg Oral QAC breakfast  . lisinopril  40 mg Oral Daily  . mometasone-formoterol  2 puff Inhalation BID  . pantoprazole  40 mg Oral Daily  . sertraline  100 mg Oral Daily  . timolol  1 drop Both Eyes TID  . traZODone  50 mg Oral QHS   Continuous Infusions:  PRN Meds:.albuterol, ALPRAZolam, ondansetron (ZOFRAN) IV, ondansetron, traMADol Assessment/Plan: Active Problems:   Diabetic hyperosmolar non-ketotic state  1. Hyperosmolar Hyperglycemic State: Patient presented with glucose 691, anion gap 15. She received 35 units lantus and 18 u novolog which brought her blood sugar down  to 212 when she was signed out by the night team. She was then placed on a SSI with her most recent BG 129 with anion gap 14. Her hyperglycemia likely 2/2 inappropriate diet or drug use.  Pt admits to cocaine use 1 week ago and positive on UDS. Pt adamantly states she has been taking her medication as prescribed. Last hemoglobin A1C was 14.8 from 12/07/2013. Pt takes Novolog 6 units TID before meals and Lantus 36 units at bedtime.  -NS 125 mL/hr  -Lantus 35 units QHS  -SSI -CBG monitoring ACQHS  -increase neurontin  To 300-300-600 for neuropathy  -diet carb modified -I/Os  -Consult to diabetes coordinator   2. Dizziness: Orthostatics were 113/70 lying, 138/65 sitting and 140/65 standing. Likely due to relative hypoglycemia since her baseline BG is so high (A1c ~15). Also xanax may contribute and can be weaned down as outpatient -PT eval -consider benzo wean as outpatient -if dizziness persists then see neuro as outpatient  3. TIA x 2 in 2012: While patient reports she feels weak, there are no neurologic deficits on exam. The R arm strength was weak for attending but I could solicit 5/5 equal strength in both arms and hands when I pushed the patient on exam. On amlodipine 5 mg daily, aspirin 81 mg daily, atorvastatin 80 mg daily, and lisinopril 40 mg daily at home.  -Continue amlodipine 5 mg daily, aspirin 81 mg daily, atorvastatin 80 mg daily, and lisinopril 40 mg daily   4. COPD: Pt pulse ox is 98% on room air. No complaint of shortness of breath. Takes albuterol 2 puffs Q6H prn wheezing and dulera 2 puffs BID.  -Continue albuterol 2 puffs Q6H prn wheezing and dulera 2 puffs BID   5. CKD stage 3: Cr currently 0.97 GFR 75. Pt is on lisinopril 40 mg daily at home.  -Continue lisinopril 40 mg   6. Hypothyroidism: Most recent TSH 93.15 on 10/27/2013. Most recent free T4 0.36 on 07/21/13. On synthroid 75 mcg daily at home.  -Stable  -Continue synthroid 75 mcg daily   7. Bipolar disorder with  depression and anxiety: On xanax 0.5 mg TID daily, depakote 1000 mg daily, and sertraline 100 mg daily. Valproic acid level 45.1.  -Continue xanax 0.5 mg TID daily, depakote1000 mg daily, and sertraline 100 mg daily  -Stable   8. Substance abuse: Pt admits to cocaine use and UDS positive. Last use was approximately 1 week ago. She denies any alcohol use. Pt has been reportedly going to NA meeting twice a day.  -Consult to social work   9. Gout: Last gout flare was less than 1 year ago. On allopurinol 300 mg daily and colchicine 0.6 mg daily prn gout flare.  -Stable  -Continue allopurinol 300 mg  -hold colchicine 0.6 mg daily prn gout flare   Dispo: Disposition is deferred at this time, awaiting improvement of current medical problems.  Anticipated discharge in approximately 1 day(s).   The patient does have a current PCP (Charlott Rakes, MD) and does need an [facility] hospital follow-up appointment after discharge.  The patient does not know have transportation limitations that hinder transportation to clinic appointments.  .Services Needed at time of discharge: Y = Yes, Blank = No PT:   OT:   RN:   Equipment:   Other:     LOS: 1 day   Kelby Aline, MD 12/18/2013, 1:26 PM

## 2013-12-18 NOTE — Plan of Care (Signed)
Problem: Limited Adherence to Nutrition-Related Recommendations (NB-1.6) Goal: Nutrition education Formal process to instruct or train a patient/client in a skill or to impart knowledge to help patients/clients voluntarily manage or modify food choices and eating behavior to maintain or improve health. Outcome: Completed/Met Date Met:  12/18/13  RD consulted for nutrition education regarding diabetes.     Lab Results  Component Value Date    HGBA1C 14.8* 12/07/2013    RD provided "Carbohydrate Counting for People with Diabetes" handout from the Academy of [employer] and Dietetics. Discussed different food groups and their effects on blood sugar, emphasizing carbohydrate-containing foods. Provided list of carbohydrates and recommended serving sizes of common foods.  Discussed importance of controlled and consistent carbohydrate intake throughout the day. Provided examples of ways to balance meals/snacks and encouraged intake of high-fiber, whole grain complex carbohydrates. Teach back method used.  Expect fair compliance.  Body mass index is 35.58 kg/(m^2). Pt meets criteria for Obesity Class II based on current BMI.  Current diet order is Carbohydrate Modified, patient is consuming approximately 100% of meals at this time. Labs and medications reviewed. No further nutrition interventions warranted at this time. RD contact information provided. If additional nutrition issues arise, please re-consult RD.  Arthur Holms, RD, LDN Pager #: [phone] After-Hours Pager #: [phone]

## 2013-12-18 NOTE — Discharge Summary (Signed)
Name: [name] MRN: [id number] DOB: 09-30-1955 58 y.o. PCP: Charlott Rakes, MD  Date of Admission: 12/17/2013  6:54 PM Date of Discharge: 12/19/2013 Attending Physician: No att. providers found  Discharge Diagnosis:  Active Problems:   Diabetic hyperosmolar non-ketotic state  Discharge Medications:   Medication List         albuterol 108 (90 BASE) MCG/ACT inhaler  Commonly known as:  PROVENTIL HFA;VENTOLIN HFA  Inhale 2 puffs into the lungs every 6 (six) hours as needed for wheezing.     albuterol (2.5 MG/3ML) 0.083% nebulizer solution  Commonly known as:  PROVENTIL  Inhale 2.5 mg into the lungs every 6 (six) hours as needed for wheezing.     allopurinol 300 MG tablet  Commonly known as:  ZYLOPRIM  Take 1 tablet (300 mg total) by mouth daily. For gout.     ALPRAZolam 0.5 MG tablet  Commonly known as:  XANAX  Take 0.5 mg by mouth 3 (three) times daily.     amLODipine 5 MG tablet  Commonly known as:  NORVASC  Take 1 tablet (5 mg total) by mouth daily. For hypertension.     ARIPiprazole 5 MG tablet  Commonly known as:  ABILIFY  Take 5 mg by mouth every morning.     aspirin EC 81 MG tablet  Take 81 mg by mouth daily.     atorvastatin 80 MG tablet  Commonly known as:  LIPITOR  Take 80 mg by mouth every morning.     BUTRANS 10 MCG/HR Ptwk patch  Generic drug:  buprenorphine  Place 1 patch onto the skin every Monday.     colchicine 0.6 MG tablet  Take 1 tablet (0.6 mg total) by mouth daily as needed (for gout flare-ups).     divalproex 500 MG DR tablet  Commonly known as:  DEPAKOTE  Take 1,000 mg by mouth at bedtime.     gabapentin 300 MG capsule  Commonly known as:  NEURONTIN  Take 1 tab in the morning, 1 tab at lunch, and 2 tabs at dinner     gemfibrozil 600 MG tablet  Commonly known as:  LOPID  Take 600 mg by mouth daily.     glucose blood test strip  Use as instructed     insulin aspart 100 UNIT/ML injection  Commonly known as:  novoLOG  Inject  6 Units into the skin 3 (three) times daily before meals.     insulin glargine 100 UNIT/ML injection  Commonly known as:  LANTUS  Inject 0.36 mLs (36 Units total) into the skin at bedtime.     latanoprost 0.005 % ophthalmic solution  Commonly known as:  XALATAN  Place 1 drop into both eyes every morning.     levothyroxine 75 MCG tablet  Commonly known as:  SYNTHROID, LEVOTHROID  Take 1 tablet (75 mcg total) by mouth daily before breakfast.     lisinopril 40 MG tablet  Commonly known as:  PRINIVIL,ZESTRIL  Take 1 tablet (40 mg total) by mouth daily.     mometasone-formoterol 100-5 MCG/ACT Aero  Commonly known as:  DULERA  Inhale 2 puffs into the lungs 2 (two) times daily.     nitroGLYCERIN 0.4 MG SL tablet  Commonly known as:  NITROSTAT  Place 1 tablet (0.4 mg total) under the tongue every 5 (five) minutes as needed for chest pain.     ondansetron 8 MG disintegrating tablet  Commonly known as:  ZOFRAN ODT  Take 1 tablet (8 mg  total) by mouth every 8 (eight) hours as needed for nausea or vomiting.     pantoprazole 40 MG tablet  Commonly known as:  PROTONIX  Take 1 tablet (40 mg total) by mouth daily. For reflux.     sertraline 100 MG tablet  Commonly known as:  ZOLOFT  Take 1 tablet (100 mg total) by mouth daily.     timolol 0.5 % ophthalmic solution  Commonly known as:  BETIMOL  Place 1 drop into both eyes 3 (three) times daily.     traMADol 50 MG tablet  Commonly known as:  ULTRAM  Take 1 tablet (50 mg total) by mouth every 6 (six) hours as needed for moderate pain.     traZODone 50 MG tablet  Commonly known as:  DESYREL  Take 50 mg by mouth at bedtime.        Disposition and follow-up:   [name] was discharged from [facility] in [place] condition.  At the hospital follow up visit please address:  1.  Dizziness, steadiness, any new neurologic complaints, medication and diet adherence  2.  Labs / imaging needed at time of follow-up:  vitamin b12, folate, consider neurology referral  3.  Pending labs/ test needing follow-up: none  Follow-up Appointments: Follow-up Information   Follow up with Charlott Rakes, MD On 12/21/2013. (at 8:15)    Specialty:  Internal Medicine   Contact information:   [place] [id number] [phone]       Discharge Instructions: Discharge Instructions   Diet - low sodium heart healthy    Complete by:  As directed      Increase activity slowly    Complete by:  As directed            Procedures Performed:  Dg Chest Port 1 View  12/07/2013   CLINICAL DATA:  Cough  EXAM: PORTABLE CHEST - 1 VIEW  COMPARISON:  Oct 01, 2013  FINDINGS: Lungs are clear. Heart size and pulmonary vascularity are normal. No adenopathy. No bone lesions.  IMPRESSION: No edema or consolidation.   Electronically Signed   By: Lowella Grip M.D.   On: 12/07/2013 01:09   Admission HPI: [name] is a 58 year old female with HTN, COPD, poorly controlled DM2, depression/anxiety, bipolar disorder, chronic pain, h/o polysubstance abuse (cocaine & benzos) who presents to the ED from the [facility] clinic. According to her clinic note, she reported feeling dizzy, with a headache, and weakness. Her sugar has been running >600. She states that she has been taking her medicine regularly for the past month: 36 units of Lantus at bedtime along with 6 units of Novolog prior to meals. She checks her sugars 2-3 times a day; her log shows morning sugars ranging 428-544 with several "high" readings. She was recently hospitalized (7/12-7/13) for HHS d/t diet and medical noncompliance.   Pt promises that she has been taking her medications as prescribed and eating how she is supposed to, but after further review she has not been compliant with her diet. In fact, pt was found eating orange [employer] chicken and fries.    Hospital Course by problem list: Active Problems:   Diabetic hyperosmolar non-ketotic  state  #Hyperosmolar Hyperglycemic State: [name] presented with glucose 691, anion gap 15. She received 35 units lantus and 18 u novolog which brought her blood sugar down to 212 when she was signed out by the night team. She was then placed on a  SSI with her BG down to 129 with anion gap 14. Her hyperglycemia is likely secondary to inappropriate diet or drug use. Patient admits to cocaine use 1 week ago and positive on UDS. Pt adamantly states she has been taking her medication as prescribed and following a strict diet but she was seen by staff eating fast food. Her last hemoglobin A1c was 14.8 on 12/07/2013. Pt takes Novolog 6 units TID before meals and Lantus 36 units at bedtime. She was seen by a diabetes coordinator to counsel her on the importance of mediation and diet adherence. We also increased her neurontin to 300-300-600 for neuropathy that she thinks is not adequately treated. The efficacy of this change should be evaluated at her follow-up appointment.  #Dizziness: [name] also complained of dizziness. This is likely explained by her HHS and, while her BG was lowered to the 100s-200s, this is likely relatively hypoglycemic for her as she has a HgbA1c of ~15. She had no focal neurologic deficits on exam as documented below. There was a question about effort on the attending's exam. Orthostatic BP were 113/70 lying, 138/65 sitting and 140/65 standing. Also xanax may contribute and can be weaned down as an outpatient. She was evaluated by PT who recommended home health PT which was ordered prior to discharge. If dizziness persists per PCP, she can be seen by neurology as an outpatient or consider cardiology referral for event monitor. Vitamin B12 and folate can also be checked.  #TIA x 2 in 2012: While patient reports she feels weak, there are no neurologic deficits on exam. Her R arm strength was weak for attending but I could solicit 5/5 equal strength in both arms and hands when I pushed the  patient on exam. She continued her home amlodipine 5 mg daily, aspirin 81 mg daily, atorvastatin 80 mg daily, and lisinopril 40 mg daily.   #COPD: [name] had a pulse ox in the high 90s on room air. She had no complaint of shortness of breath. She continued her home albuterol 2 puffs Q6H prn wheezing and dulera 2 puffs BID.   #CKD stage 3: [name]'s creatinine was 0.97-1.14 during her stay with GFR  65-75. She continued her home lisinopril 40 mg daily at home.   #Hypothyroidism: The patient's most recent TSH was 93.15 on 10/27/2013. Most recent free T4 0.36 on 07/21/13. This is likely noncontributory but the dosage of her home synthroid 75 mcg daily can be considered as an outpatient.  #Bipolar disorder with depression and anxiety: Patient was asymptomatic during her stay. She was continued on her home xanax 0.5 mg TID daily, depakote 1000 mg daily, and sertraline 100 mg daily. Her last valproic acid level was 45.1 on 12/06/13. Benzodiazepine wean can be considered as outpatient as it may contribute to dizziness.  #Substance abuse: [name] admits to cocaine use ~ one week ago and was UDS positive. She denies any alcohol use. Pt has been reportedly going to NA meeting twice a day.   #Gout: [name] last gout flare was less than 1 year ago. She continued her allopurinol 300 mg daily but held colchicine 0.6 mg daily prn as she didn't have a gout flare.  Discharge Vitals:   BP 113/70  Pulse 62  Temp(Src) 97.7 F (36.5 C) (Oral)  Resp 18  Ht 5' 1.81" (1.57 m)  Wt 193 lb 5.5 oz (87.7 kg)  BMI 35.58 kg/m2  SpO2 98%  Discharge Physical Exam: General Appearance: Alert, cooperative, no distress,  appears stated age  Head: Normocephalic, without obvious abnormality, atraumatic, PERRL, EOMI, MMM  Neck: No carotid bruit or JVD  Back: Symmetric, no curvature  Lungs: Clear to auscultation bilaterally, respirations unlabored  Chest Wall: No tenderness or deformity  Heart: Regular rate and rhythm,  S1 and S2 normal, no murmur, rub or gallop  Abdomen: Soft, non-tender, bowel sounds active all four quadrants, no masses, no organomegaly  Extremities: Extremities normal, atraumatic, no cyanosis or edema  Pulses: 2+ and symmetric radial and DP pulses  Neurologic: CNII-XII intact, 5/5 strength in U and LE, no Babinksi, patient reports "different" sensation to light touch in RLE that does not follow a dermatomal distribution, normal biceps and patellar reflexes    Discharge Labs:  No results found for this or any previous visit (from the past 24 hour(s)). Basic Metabolic Panel:  Recent Labs  12/17/13 2200 12/18/13 0005 12/18/13 0231  NA 133* 136* 135*  K 4.1 4.4 4.3  CL 96 100 101  CO2 23 23 20   GLUCOSE 412* 366* 360*  BUN 16 15 14   CREATININE 1.09 1.00 0.97  CALCIUM 8.4 8.1* 8.0*  MG 2.2  --   --    Liver Function Tests: No results found for this basename: AST, ALT, ALKPHOS, BILITOT, PROT, ALBUMIN,  in the last 72 hours No results found for this basename: LIPASE, AMYLASE,  in the last 72 hours No results found for this basename: AMMONIA,  in the last 72 hours CBC:  Recent Labs  12/17/13 2200  WBC 7.0  HGB 13.2  HCT 40.1  MCV 90.1  PLT 207   Cardiac Enzymes: No results found for this basename: CKTOTAL, CKMB, CKMBINDEX, TROPONINI,  in the last 72 hours BNP: No results found for this basename: PROBNP,  in the last 72 hours D-Dimer: No results found for this basename: DDIMER,  in the last 72 hours CBG:  Recent Labs  12/18/13 0022 12/18/13 0159 12/18/13 0412 12/18/13 0609 12/18/13 0750 12/18/13 1143  GLUCAP 358* 365* 330* 212* 129* 314*   Hemoglobin A1C: No results found for this basename: HGBA1C,  in the last 72 hours Fasting Lipid Panel: No results found for this basename: CHOL, HDL, LDLCALC, TRIG, CHOLHDL, LDLDIRECT,  in the last 72 hours Thyroid Function Tests: No results found for this basename: TSH, T4TOTAL, FREET4, T3FREE, THYROIDAB,  in the last 72  hours Anemia Panel: No results found for this basename: VITAMINB12, FOLATE, FERRITIN, TIBC, IRON, RETICCTPCT,  in the last 72 hours Coagulation: No results found for this basename: LABPROT, INR,  in the last 72 hours Urine Drug Screen: Drugs of Abuse     Component Value Date/Time   LABOPIA NONE DETECTED 12/18/2013 0204   LABOPIA NEG 01/23/2013 1507   COCAINSCRNUR POSITIVE* 12/18/2013 0204   COCAINSCRNUR NEG 01/23/2013 1507   LABBENZ NONE DETECTED 12/18/2013 0204   LABBENZ NEG 01/23/2013 1507   AMPHETMU NONE DETECTED 12/18/2013 0204   THCU NONE DETECTED 12/18/2013 0204   LABBARB NONE DETECTED 12/18/2013 0204   LABBARB NEG 01/23/2013 1507    Alcohol Level: No results found for this basename: ETH,  in the last 72 hours Urinalysis:  Recent Labs  12/17/13 1540  BILIRUBINUR negative  PROTEINUR negative  UROBILINOGEN 0.2  NITRITE negative  LEUKOCYTESUR Negative    Signed: Kelby Aline, MD 12/19/2013, 1:42 PM    Services Ordered on Discharge: none Equipment Ordered on Discharge: none

## 2013-12-18 NOTE — Progress Notes (Signed)
Inpatient Diabetes Program Recommendations  AACE/ADA: New Consensus Statement on Inpatient Glycemic Control (2013)  Target Ranges:  Prepandial:   less than 140 mg/dL      Peak postprandial:   less than 180 mg/dL (1-2 hours)      Critically ill patients:  140 - 180 mg/dL   Results for NICOLAS, SISLER (MRN [id number]) as of 12/18/2013 12:31  Ref. Range 12/17/2013 15:11 12/17/2013 17:19 12/17/2013 19:08 12/17/2013 20:35 12/17/2013 22:24 12/18/2013 00:22 12/18/2013 01:59 12/18/2013 04:12 12/18/2013 06:09 12/18/2013 07:50 12/18/2013 11:43  Glucose-Capillary Latest Range: 70-99 mg/dL >600 (HH) 553 (HH) 348 (H) 435 (H) 419 (H) 358 (H) 365 (H) 330 (H) 212 (H) 129 (H) 314 (H)    Diabetes history: DM2 Outpatient Diabetes medications: Lantus 36 units HS, Novolog 6 units TID with meals Current orders for Inpatient glycemic control: Lantus 35 units QHS, Novolog 0-15 units AC  Inpatient Diabetes Program Recommendations Insulin - Meal Coverage: Please consider ordering Novolog 6 unit TID with meals for meal coverage (in additon to Novolog correction scale). Insulin Correction: Please consider ordering Novolog bedtime correction scale. Outpatient Referral: Noted referral for education on diet. Placed order for inpatient dietitian consult. According to the chart, patient has been seen by Debera Lat, RD for diet education. Would suggest making a follow up appointment with Debera Lat, RD (with Internal Medicine) for further diabetes education as an outpatient.  Thanks, Barnie Alderman, RN, MSN, CCRN Diabetes Coordinator Inpatient Diabetes Program [phone] (Team Pager) [phone] ([facility] office) [phone] [facility] office)

## 2013-12-18 NOTE — Evaluation (Signed)
Physical Therapy Evaluation Patient Details Name: [name] MRN: [id number] DOB: 1955/10/02 Today's Date: 12/18/2013   History of Present Illness  Adm 12/17/13 with feeling dizzy and weak. She denied falling down, syncope, presyncope, positional nature of the dizziness. Pt also with uncontrolled DM2. UDS + for cocaine  PMHx- HTN, noncompliance with DM management, bipolar disorder, polysubstance abuse.  Clinical Impression  Pt admitted with dizziness and hyperglycemia. She reports it's a few months since she lost her balance and fell. Now uses either cane or RW at all times. Pt limited today by dizziness and tremulous with walking 25 ft. Pt questioned and reports this does not feel like withdrawal symptoms to her. Unclear etiology with medicine concluding it is from her poorly controlled DM. Pt currently with functional limitations due to the deficits listed below (see PT Problem List). Unclear if pt will benefit from skilled PT to increase her independence and safety with mobility as the cause of dizziness is unknown. Pt is agreeable to continued therapy, including HHPT.     Follow Up Recommendations Home health PT;Supervision for mobility/OOB (Pt states her husband can provide care 24/7)    Equipment Recommendations  None recommended by PT    Recommendations for Other Services       Precautions / Restrictions Precautions Precautions: Fall      Mobility  Bed Mobility Overal bed mobility: Modified Independent             General bed mobility comments: supine to sit and sit to sidelying without difficulty  Transfers Overall transfer level: Needs assistance Equipment used: Rolling walker (2 wheeled) Transfers: Sit to/from Stand Sit to Stand: Min guard         General transfer comment: vc for safe use of RW; assist to steady pt and RW as coming to stand  Ambulation/Gait Ambulation/Gait assistance: Min guard Ambulation Distance (Feet): 25 Feet Assistive device: Rolling  walker (2 wheeled) Gait Pattern/deviations: Step-through pattern;Decreased stride length Gait velocity: significantly decr Gait velocity interpretation: <1.8 ft/sec, indicative of risk for recurrent falls General Gait Details: pt reported increasing dizziness; became tremulous/shakey while walking; difficulty maneuvering RW (ran into wall on her Rt)  Stairs            Wheelchair Mobility    Modified Rankin (Stroke Patients Only)       Balance Overall balance assessment: Needs assistance                               Standardized Balance Assessment Standardized Balance Assessment : Berg Balance Test Berg Balance Test Sit to Stand: Needs minimal aid to stand or to stabilize Standing Unsupported: Needs several tries to stand 30 seconds unsupported Sitting with Back Unsupported but Feet Supported on Floor or Stool: Able to sit safely and securely 2 minutes Stand to Sit: Sits safely with minimal use of hands Transfers: Needs one person to assist Standing Unsupported with Eyes Closed: Able to stand 3 seconds From Standing, Reach Forward with Outstretched Arm: Can reach forward >5 cm safely (2") From Standing Position, Turn to Look Behind Over each Shoulder: Turn sideways only but maintains balance Standing Unsupported, One Foot in Front: Able to take small step independently and hold 30 seconds         Pertinent Vitals/Pain VSS on monitor; HR 64 Dizziness with lightheadedness; orthostatics negative    Home Living Family/patient expects to be discharged to:: Private residence Living Arrangements: Spouse/significant other Available Help  at Discharge: Family;Available 24 hours/day;Personal care attendant Type of Home: House Home Access: Ramped entrance     Home Layout: One level Home Equipment: [place] - single point;Walker - 2 wheels      Prior Function Level of Independence: Independent with assistive device(s)   Gait / Transfers Assistance Needed: uses  cane mostly, but will use RW when feeling worse           Hand Dominance   Dominant Hand: Right    Extremity/Trunk Assessment   Upper Extremity Assessment: Overall WFL for tasks assessed           Lower Extremity Assessment: Overall WFL for tasks assessed      Cervical / Trunk Assessment: Normal  Communication   Communication: No difficulties  Cognition Arousal/Alertness: Awake/alert Behavior During Therapy: WFL for tasks assessed/performed Overall Cognitive Status: Within Functional Limits for tasks assessed                      General Comments General comments (skin integrity, edema, etc.): Stood x2; first x 2 minutes with increasing dizziness despite marching and mini squats. Pt describes feeling as "lightheaded"    Exercises        Assessment/Plan    PT Assessment Patient needs continued PT services  PT Diagnosis Difficulty walking   PT Problem List Decreased activity tolerance;Decreased balance;Decreased mobility;Decreased knowledge of use of DME;Decreased safety awareness;Impaired sensation  PT Treatment Interventions DME instruction;Gait training;Functional mobility training;Therapeutic activities;Therapeutic exercise;Balance training;Cognitive remediation;Patient/family education   PT Goals (Current goals can be found in the Care Plan section) Acute Rehab PT Goals Patient Stated Goal: initially to go home today; after getting dizzy while walking, wants to stay another day PT Goal Formulation: With patient Time For Goal Achievement: 12/21/13 Potential to Achieve Goals: Good    Frequency Min 3X/week   Barriers to discharge        Co-evaluation               End of Session Equipment Utilized During Treatment: Gait belt Activity Tolerance: Treatment limited secondary to medical complications (Comment) Patient left: in bed;with call bell/phone within reach;with bed alarm set;with family/visitor present Nurse Communication: Mobility  status    Functional Assessment Tool Used: clinical judgement Functional Limitation: Mobility: Walking and moving around Mobility: Walking and Moving Around Current Status ([id number]): At least 1 percent but less than 20 percent impaired, limited or restricted Mobility: Walking and Moving Around Goal Status [phone]): At least 1 percent but less than 20 percent impaired, limited or restricted    Time: 1401-1417 PT Time Calculation (min): 16 min   Charges:   PT Evaluation $Initial PT Evaluation Tier I: 1 Procedure     PT G Codes:   Functional Assessment Tool Used: clinical judgement Functional Limitation: Mobility: Walking and moving around    Dabria Wadas 12/18/2013, 2:36 PM Pager [phone]

## 2013-12-18 NOTE — Progress Notes (Signed)
Inpatient Diabetes Program Recommendations  AACE/ADA: New Consensus Statement on Inpatient Glycemic Control (2013)  Target Ranges:  Prepandial:   less than 140 mg/dL      Peak postprandial:   less than 180 mg/dL (1-2 hours)      Critically ill patients:  140 - 180 mg/dL   12/18/13- Spoke with patient at bedside.  Can articulate use of both insulins (Novolog and Lantus), storage and site rotation for insulin.  Knows what carbs are and how to treat a low blood sugar. Keeps meter with her and has it in her purse at this time.  Patient did not know she had to discard her insulin after opening and keeps her insulin until she empties the bottle, often over a month and a half.  Reviewed the use of a correction scale to be used before each meal (in addition to her set Novolog dose pre-meal).  Gentry Fitz, RN, BA, MHA, CDE Diabetes Coordinator Inpatient Diabetes Program  [phone] (Team Pager) [phone] [facility] Office) 12/18/2013 2:09 PM

## 2013-12-18 NOTE — Care Management Note (Signed)
    Page 1 of 1   12/18/2013     3:16:07 PM CARE MANAGEMENT NOTE 12/18/2013  Patient:  [name], [name]   Account Number:  [id number]  Date Initiated:  12/18/2013  Documentation initiated by:  Stassi Fadely  Subjective/Objective Assessment:   dx hyperglycemia; lives with family    PCP  [place]  CM consult      Choice offered to / List presented to:  C-1 Patient    [place] arranged  [place] PT      [facility].   Status of service:  Completed, signed off  Discharge Disposition:  [place]  Per UR Regulation:  Reviewed for med. necessity/level of care/duration of stay  Comments:  12/18/13 [place] MSN BSN CCM PT recommends home therapy.  Provided list of [facility] health agencies, referral made per choice.

## 2013-12-21 ENCOUNTER — Ambulatory Visit (INDEPENDENT_AMBULATORY_CARE_PROVIDER_SITE_OTHER): Payer: PRIVATE HEALTH INSURANCE | Admitting: Internal Medicine

## 2013-12-21 ENCOUNTER — Encounter: Payer: Self-pay | Admitting: Internal Medicine

## 2013-12-21 VITALS — BP 166/91 | HR 52 | Temp 97.6°F | Ht 61.0 in | Wt 196.2 lb

## 2013-12-21 DIAGNOSIS — M549 Dorsalgia, unspecified: Secondary | ICD-10-CM

## 2013-12-21 DIAGNOSIS — R29818 Other symptoms and signs involving the nervous system: Secondary | ICD-10-CM

## 2013-12-21 DIAGNOSIS — IMO0002 Reserved for concepts with insufficient information to code with codable children: Secondary | ICD-10-CM

## 2013-12-21 DIAGNOSIS — E1149 Type 2 diabetes mellitus with other diabetic neurological complication: Secondary | ICD-10-CM

## 2013-12-21 DIAGNOSIS — E1165 Type 2 diabetes mellitus with hyperglycemia: Principal | ICD-10-CM

## 2013-12-21 DIAGNOSIS — G8929 Other chronic pain: Secondary | ICD-10-CM

## 2013-12-21 DIAGNOSIS — R2990 Unspecified symptoms and signs involving the nervous system: Secondary | ICD-10-CM

## 2013-12-21 LAB — GLUCOSE, CAPILLARY: GLUCOSE-CAPILLARY: 305 mg/dL — AB (ref 70–99)

## 2013-12-21 MED ORDER — INSULIN ASPART 100 UNIT/ML FLEXPEN: 8.0000 [IU] | PEN_INJECTOR | Freq: Three times a day (TID) | SUBCUTANEOUS | Status: DC

## 2013-12-21 MED ORDER — INSULIN GLARGINE 100 UNITS/ML SOLOSTAR PEN: 36.0000 [IU] | PEN_INJECTOR | Freq: Every day | SUBCUTANEOUS | Status: DC

## 2013-12-21 MED ORDER — TRAMADOL HCL 50 MG PO TABS: 50.0000 mg | ORAL_TABLET | Freq: Four times a day (QID) | ORAL | Status: DC | PRN

## 2013-12-21 NOTE — Assessment & Plan Note (Addendum)
Lab Results  Component Value Date   HGBA1C 14.8* 12/07/2013   HGBA1C 15.5* 09/03/2013   HGBA1C 15.3* 07/20/2013     Assessment: Diabetes control: poor control (HgbA1C >9%) Progress toward A1C goal:  unable to assess Comments: Sugar today is stable from d/c -Patient expresses some insight into her disease with regards to the insulin but not so much diet given what she was found to be eating in the hospital. -Requires close follow-up to help her maintain glycemic control  Plan: Medications:  Lantus 36 units (bedtime) & Novolog 8 units (before meals) Home glucose monitoring: Frequency:   Timing:   Instruction/counseling given: reminded to bring blood glucose meter & log to each visit Educational resources provided:   Self management tools provided:   Other plans:  -Reminded her to pick up her gabapentin  -Reminded her to bring medication and glucometer to next visit -Consult Butch Penny about availability of pens and plan to call patient today-->Prescribed pens and cancelled other prescriptions at pharmacy; called patient to inform -Reiterated the importance of diet to the patient -Plan to f/u next week to reassess glycemic control and neuropathic pain

## 2013-12-21 NOTE — Progress Notes (Signed)
   Subjective:    Patient ID: [name], female    DOB: 20-Jan-1956, 58 y.o.   MRN: [id number]  HPI [name] is a 58 year old female with HTN, COPD, poorly controlled DM2, depression/anxiety, bipolar disorder, chronic pain, h/o polysubstance abuse (cocaine & benzos) who presents today for follow-up for hospitalization.  On 7/23, she was seen by me in clinic and was admitted to  the hospital after her sugar was found to be 600+. UA was negative for ketones. She complained of not feeling well and was admitted overnight to bring her glucose back down. Of note, she had been hospitalized earlier this month (7/12-7/13) for the same issue.   DM2: Her POC glucose is 305, stable from discharge at 314. There was a question of her not eating well as she was found to be eating [employer] chicken and fries along with an orange sherbet. After meeting with the diabetic educator, she did tell me that one reason why her sugar might have been running high was because she may have been using expired insulin. She is now on 36 units of Lantus of night with 8 units of Novolog before meals.   At discharge, her gabapentin was increased 300mg TID->300/300/600, but she was not aware of this change in her prescription.  Dizziness: She complained of dizziness during her hospitalization. It was thought that since her sugar was decreased to 100-200, it may have precipitates symptoms akin to a hypoglycemic state given that her last A1c was 15. No focal neurologic efforts were documented on exam. Today, she feels it's better in general.   Pain: She needed a refill of Tramadol today.   Review of Systems  Cardiovascular: Negative for chest pain.  Endocrine: Negative for polydipsia and polyuria.  Musculoskeletal:       Neuropathic pain in feet at night  Neurological: Negative for dizziness, syncope and headaches.       Objective:   Physical Exam  Constitutional: She is oriented to person, place, and time. She appears  well-developed and well-nourished. No distress.  HENT:  Head: Normocephalic and atraumatic.  Eyes: Conjunctivae are normal. Pupils are equal, round, and reactive to light.  Cardiovascular: Normal rate, regular rhythm and normal heart sounds.  Exam reveals no gallop and no friction rub.   No murmur heard. Pulmonary/Chest: Effort normal and breath sounds normal. No respiratory distress. She has no wheezes. She has no rales.  Musculoskeletal:  UE & LE strengtht 5/5. Finger grip testing 2+.  Neurological: She is alert and oriented to person, place, and time. No cranial nerve deficit.  Skin: Skin is warm and dry. She is not diaphoretic.  Psychiatric: She has a normal mood and affect. Her behavior is normal.          Assessment & Plan:

## 2013-12-21 NOTE — Progress Notes (Signed)
I saw and evaluated the patient.  I personally confirmed the key portions of the history and exam documented by Dr. Posey Pronto and I reviewed pertinent patient test results.  The assessment, diagnosis, and plan were formulated together and I agree with the documentation in the resident's note.  [name] was deemed appropriate for admission.  Admitting team called and given report.

## 2013-12-21 NOTE — Progress Notes (Signed)
INTERNAL MEDICINE TEACHING ATTENDING ADDENDUM - Aldine Contes, MD: I personally saw and evaluated Ms. Gonsalves in this clinic visit in conjunction with the resident, Dr. Posey Pronto. I have discussed patient's plan of care with medical resident during this visit. I have confirmed the physical exam findings and have read and agree with the clinic note including the plan with the following addition: - Patient here for diabetes follow up - On exam: Cardio- RRR, normal heart sounds, Lungs - CTA b/l, No lower extremity edema - Patient expresses understanding of diet and will attempt to follow diabetic diet - Continue with current insulin regimen for now - Patient had her gabapentin dose increased on this hospitalization. Will monitor pain on new regimen - Tramadol refilled for back pain

## 2013-12-21 NOTE — Addendum Note (Signed)
Addended by: Riccardo Dubin on: 12/21/2013 02:03 PM   Modules accepted: Orders

## 2013-12-21 NOTE — Assessment & Plan Note (Signed)
Assessment -Her symptoms are better today are thus likely 2/2 HHS from last week -Her physical exam findings are inconsistent with reported symptoms -Less likely related to B12/folate given her unremarkable MCV & Hb  Plan -Continue re-assessing her symptoms

## 2013-12-21 NOTE — Patient Instructions (Addendum)
Please continue taking your insulin (36 units at night/8 units before each meal).  Please take gabapentin: 1 pill in morning & afternoon, 2 in evening.  Please bring your medicines & glucometer with you each time you come.   Medicines may be  Eye drops  Herbal   Vitamins  Pills  Seeing these help [place] take care of you.  Please see me back next week. Thank you!

## 2013-12-21 NOTE — Assessment & Plan Note (Signed)
Assessment -Pain is better today but likely related to overall not feeling well last week with sugars running high  Plan -Refilled Tramadol  -Asked patient to bring in medication to each visit to continue monitoring given her h/o polysubstance abuse

## 2013-12-22 ENCOUNTER — Telehealth: Payer: Self-pay | Admitting: *Deleted

## 2013-12-22 NOTE — Telephone Encounter (Signed)
Call from [place] with [facility]. # - [phone]  Pt in hospital from 7/23 - 7/25 for Diabetic hyperosmolar non-ketotic state. Therapist saw pt in home yesterday and she wants to be sure Dr Posey Pronto will sign off on home health PT orders.  PT recommended at hospital discharge. She also request Skilled nursing for medication management.  Pt seen in clinic yesterday by Dr Posey Pronto for [place]  I can take Verbal Order for this.

## 2013-12-22 NOTE — Telephone Encounter (Signed)
HHN informed 

## 2013-12-22 NOTE — Telephone Encounter (Signed)
Yes, this is fine. I would like to sign off on these orders.   Thank you.

## 2013-12-25 NOTE — Discharge Summary (Signed)
I discussed the plan of care and discharge management with my IM team - Dr Algis Liming, Arcelia Jew and Dr Ethelene Hal. I was not covering over the weekend, and I did not see the patient on the day of discharge. I have reviewed the discharge plan and I agree with the documentation of the management plan.

## 2013-12-26 ENCOUNTER — Telehealth: Payer: Self-pay | Admitting: Internal Medicine

## 2013-12-26 NOTE — Telephone Encounter (Signed)
I called her just to see how she was doing. She told me her sugars were doing well and that she looks forward to our next appointment together.

## 2013-12-28 NOTE — Telephone Encounter (Signed)
Was seen in ER 12/06/13 and admitted. Was seen in [facility] 12/17/13 Dr Posey Pronto.

## 2013-12-29 ENCOUNTER — Other Ambulatory Visit: Payer: Self-pay | Admitting: Internal Medicine

## 2013-12-29 MED ORDER — INSULIN PEN NEEDLE 31G X 5 MM MISC: Status: DC

## 2013-12-29 NOTE — Telephone Encounter (Signed)
Spoke to [employer] and refilled accordingly. Thanks!

## 2013-12-30 ENCOUNTER — Inpatient Hospital Stay (HOSPITAL_COMMUNITY)
Admission: AD | Admit: 2013-12-30 | Discharge: 2014-01-03 | DRG: 638 | Disposition: A | Discharge status: Home / Self Care | Payer: PRIVATE HEALTH INSURANCE | Source: Ambulatory Visit | Attending: Internal Medicine | Admitting: Internal Medicine

## 2013-12-30 ENCOUNTER — Encounter (HOSPITAL_COMMUNITY): Payer: Self-pay | Admitting: General Practice

## 2013-12-30 ENCOUNTER — Ambulatory Visit (INDEPENDENT_AMBULATORY_CARE_PROVIDER_SITE_OTHER): Payer: PRIVATE HEALTH INSURANCE | Admitting: Internal Medicine

## 2013-12-30 ENCOUNTER — Encounter: Payer: Self-pay | Admitting: Dietician

## 2013-12-30 ENCOUNTER — Ambulatory Visit: Payer: PRIVATE HEALTH INSURANCE | Admitting: Internal Medicine

## 2013-12-30 ENCOUNTER — Telehealth: Payer: Self-pay | Admitting: Internal Medicine

## 2013-12-30 VITALS — BP 100/68 | HR 71 | Temp 97.0°F | Ht 62.0 in | Wt 196.5 lb

## 2013-12-30 DIAGNOSIS — Z8673 Personal history of transient ischemic attack (TIA), and cerebral infarction without residual deficits: Secondary | ICD-10-CM | POA: Diagnosis not present

## 2013-12-30 DIAGNOSIS — K7689 Other specified diseases of liver: Secondary | ICD-10-CM | POA: Diagnosis present

## 2013-12-30 DIAGNOSIS — IMO0002 Reserved for concepts with insufficient information to code with codable children: Secondary | ICD-10-CM | POA: Diagnosis present

## 2013-12-30 DIAGNOSIS — E861 Hypovolemia: Secondary | ICD-10-CM | POA: Diagnosis present

## 2013-12-30 DIAGNOSIS — Z888 Allergy status to other drugs, medicaments and biological substances status: Secondary | ICD-10-CM

## 2013-12-30 DIAGNOSIS — J449 Chronic obstructive pulmonary disease, unspecified: Secondary | ICD-10-CM | POA: Diagnosis present

## 2013-12-30 DIAGNOSIS — F411 Generalized anxiety disorder: Secondary | ICD-10-CM | POA: Diagnosis present

## 2013-12-30 DIAGNOSIS — E039 Hypothyroidism, unspecified: Secondary | ICD-10-CM | POA: Diagnosis present

## 2013-12-30 DIAGNOSIS — F209 Schizophrenia, unspecified: Secondary | ICD-10-CM | POA: Diagnosis present

## 2013-12-30 DIAGNOSIS — G8929 Other chronic pain: Secondary | ICD-10-CM | POA: Diagnosis present

## 2013-12-30 DIAGNOSIS — Z6841 Body Mass Index (BMI) 40.0 and over, adult: Secondary | ICD-10-CM

## 2013-12-30 DIAGNOSIS — E785 Hyperlipidemia, unspecified: Secondary | ICD-10-CM | POA: Diagnosis present

## 2013-12-30 DIAGNOSIS — M47817 Spondylosis without myelopathy or radiculopathy, lumbosacral region: Secondary | ICD-10-CM | POA: Diagnosis present

## 2013-12-30 DIAGNOSIS — F1410 Cocaine abuse, uncomplicated: Secondary | ICD-10-CM | POA: Diagnosis present

## 2013-12-30 DIAGNOSIS — E1110 Type 2 diabetes mellitus with ketoacidosis without coma: Secondary | ICD-10-CM

## 2013-12-30 DIAGNOSIS — E1310 Other specified diabetes mellitus with ketoacidosis without coma: Principal | ICD-10-CM | POA: Diagnosis present

## 2013-12-30 DIAGNOSIS — H409 Unspecified glaucoma: Secondary | ICD-10-CM | POA: Diagnosis present

## 2013-12-30 DIAGNOSIS — M25559 Pain in unspecified hip: Secondary | ICD-10-CM | POA: Diagnosis present

## 2013-12-30 DIAGNOSIS — Z91199 Patient's noncompliance with other medical treatment and regimen due to unspecified reason: Secondary | ICD-10-CM

## 2013-12-30 DIAGNOSIS — N183 Chronic kidney disease, stage 3 unspecified: Secondary | ICD-10-CM | POA: Diagnosis present

## 2013-12-30 DIAGNOSIS — E1142 Type 2 diabetes mellitus with diabetic polyneuropathy: Secondary | ICD-10-CM | POA: Diagnosis present

## 2013-12-30 DIAGNOSIS — J4489 Other specified chronic obstructive pulmonary disease: Secondary | ICD-10-CM | POA: Diagnosis present

## 2013-12-30 DIAGNOSIS — Z794 Long term (current) use of insulin: Secondary | ICD-10-CM

## 2013-12-30 DIAGNOSIS — M109 Gout, unspecified: Secondary | ICD-10-CM

## 2013-12-30 DIAGNOSIS — E0590 Thyrotoxicosis, unspecified without thyrotoxic crisis or storm: Secondary | ICD-10-CM | POA: Diagnosis present

## 2013-12-30 DIAGNOSIS — E1101 Type 2 diabetes mellitus with hyperosmolarity with coma: Secondary | ICD-10-CM

## 2013-12-30 DIAGNOSIS — E1165 Type 2 diabetes mellitus with hyperglycemia: Secondary | ICD-10-CM

## 2013-12-30 DIAGNOSIS — Z886 Allergy status to analgesic agent status: Secondary | ICD-10-CM | POA: Diagnosis not present

## 2013-12-30 DIAGNOSIS — E1100 Type 2 diabetes mellitus with hyperosmolarity without nonketotic hyperglycemic-hyperosmolar coma (NKHHC): Secondary | ICD-10-CM

## 2013-12-30 DIAGNOSIS — Z9119 Patient's noncompliance with other medical treatment and regimen: Secondary | ICD-10-CM

## 2013-12-30 DIAGNOSIS — I129 Hypertensive chronic kidney disease with stage 1 through stage 4 chronic kidney disease, or unspecified chronic kidney disease: Secondary | ICD-10-CM | POA: Diagnosis present

## 2013-12-30 DIAGNOSIS — N179 Acute kidney failure, unspecified: Secondary | ICD-10-CM | POA: Diagnosis present

## 2013-12-30 DIAGNOSIS — F319 Bipolar disorder, unspecified: Secondary | ICD-10-CM | POA: Diagnosis present

## 2013-12-30 DIAGNOSIS — E1149 Type 2 diabetes mellitus with other diabetic neurological complication: Secondary | ICD-10-CM | POA: Diagnosis present

## 2013-12-30 DIAGNOSIS — F3189 Other bipolar disorder: Secondary | ICD-10-CM

## 2013-12-30 DIAGNOSIS — M549 Dorsalgia, unspecified: Secondary | ICD-10-CM

## 2013-12-30 DIAGNOSIS — Z8249 Family history of ischemic heart disease and other diseases of the circulatory system: Secondary | ICD-10-CM

## 2013-12-30 DIAGNOSIS — N949 Unspecified condition associated with female genital organs and menstrual cycle: Secondary | ICD-10-CM | POA: Diagnosis present

## 2013-12-30 HISTORY — DX: Chronic kidney disease, stage 3 (moderate): N18.3

## 2013-12-30 HISTORY — DX: Low back pain, unspecified: M54.50

## 2013-12-30 HISTORY — DX: Low back pain: M54.5

## 2013-12-30 HISTORY — DX: Chronic kidney disease, stage 3 unspecified: N18.30

## 2013-12-30 HISTORY — DX: Anxiety disorder, unspecified: F41.9

## 2013-12-30 HISTORY — DX: Hypothyroidism, unspecified: E03.9

## 2013-12-30 HISTORY — DX: Other chronic pain: G89.29

## 2013-12-30 HISTORY — DX: Type 2 diabetes mellitus without complications: E11.9

## 2013-12-30 LAB — URINALYSIS, ROUTINE W REFLEX MICROSCOPIC
Bilirubin Urine: NEGATIVE
Hgb urine dipstick: NEGATIVE
KETONES UR: NEGATIVE mg/dL
Nitrite: NEGATIVE
PH: 5 (ref 5.0–8.0)
Protein, ur: NEGATIVE mg/dL
Specific Gravity, Urine: 1.031 — ABNORMAL HIGH (ref 1.005–1.030)
Urobilinogen, UA: 0.2 mg/dL (ref 0.0–1.0)

## 2013-12-30 LAB — GLUCOSE, CAPILLARY
GLUCOSE-CAPILLARY: 447 mg/dL — AB (ref 70–99)
GLUCOSE-CAPILLARY: 333 mg/dL — AB (ref 70–99)
GLUCOSE-CAPILLARY: 303 mg/dL — AB (ref 70–99)
Glucose-Capillary: 283 mg/dL — ABNORMAL HIGH (ref 70–99)
Glucose-Capillary: 287 mg/dL — ABNORMAL HIGH (ref 70–99)

## 2013-12-30 LAB — RAPID URINE DRUG SCREEN, HOSP PERFORMED
AMPHETAMINES: NOT DETECTED
Barbiturates: NOT DETECTED
Benzodiazepines: NOT DETECTED
Cocaine: POSITIVE — AB
OPIATES: NOT DETECTED
Tetrahydrocannabinol: NOT DETECTED

## 2013-12-30 LAB — COMPREHENSIVE METABOLIC PANEL
ALT: 20 U/L (ref 0–35)
AST: 16 U/L (ref 0–37)
Albumin: 3.5 g/dL (ref 3.5–5.2)
Alkaline Phosphatase: 138 U/L — ABNORMAL HIGH (ref 39–117)
BUN: 19 mg/dL (ref 6–23)
CALCIUM: 9 mg/dL (ref 8.4–10.5)
CHLORIDE: 89 meq/L — AB (ref 96–112)
CO2: 21 meq/L (ref 19–32)
CREATININE: 1.35 mg/dL — AB (ref 0.50–1.10)
Glucose, Bld: 642 mg/dL (ref 70–99)
Potassium: 4.2 mEq/L (ref 3.5–5.3)
Sodium: 126 mEq/L — ABNORMAL LOW (ref 135–145)
Total Bilirubin: 0.3 mg/dL (ref 0.2–1.2)
Total Protein: 7 g/dL (ref 6.0–8.3)

## 2013-12-30 LAB — BASIC METABOLIC PANEL
Anion gap: 15 (ref 5–15)
Anion gap: 14 (ref 5–15)
BUN: 16 mg/dL (ref 6–23)
BUN: 16 mg/dL (ref 6–23)
CALCIUM: 8.7 mg/dL (ref 8.4–10.5)
CO2: 21 mEq/L (ref 19–32)
CO2: 21 mEq/L (ref 19–32)
CREATININE: 1.09 mg/dL (ref 0.50–1.10)
Calcium: 8.6 mg/dL (ref 8.4–10.5)
Chloride: 98 mEq/L (ref 96–112)
Chloride: 99 mEq/L (ref 96–112)
Creatinine, Ser: 1.04 mg/dL (ref 0.50–1.10)
GFR, EST AFRICAN AMERICAN: 65 mL/min — AB (ref >90)
GFR, EST AFRICAN AMERICAN: 69 mL/min — AB (ref >90)
GFR, EST NON AFRICAN AMERICAN: 56 mL/min — AB (ref >90)
GFR, EST NON AFRICAN AMERICAN: 59 mL/min — AB (ref >90)
Glucose, Bld: 286 mg/dL — ABNORMAL HIGH (ref 70–99)
Glucose, Bld: 285 mg/dL — ABNORMAL HIGH (ref 70–99)
Potassium: 3.7 mEq/L (ref 3.7–5.3)
Potassium: 3.8 mEq/L (ref 3.7–5.3)
Sodium: 134 mEq/L — ABNORMAL LOW (ref 137–147)
Sodium: 134 mEq/L — ABNORMAL LOW (ref 137–147)

## 2013-12-30 LAB — CBC
HCT: 39.3 % (ref 36.0–46.0)
HEMOGLOBIN: 13.1 g/dL (ref 12.0–15.0)
MCH: 29.3 pg (ref 26.0–34.0)
MCHC: 33.3 g/dL (ref 30.0–36.0)
MCV: 87.9 fL (ref 78.0–100.0)
Platelets: 296 10*3/uL (ref 150–400)
RBC: 4.47 MIL/uL (ref 3.87–5.11)
RDW: 14.2 % (ref 11.5–15.5)
WBC: 8 10*3/uL (ref 4.0–10.5)

## 2013-12-30 LAB — TSH: TSH: 67.19 u[IU]/mL — ABNORMAL HIGH (ref 0.350–4.500)

## 2013-12-30 LAB — MAGNESIUM: MAGNESIUM: 1.9 mg/dL (ref 1.5–2.5)

## 2013-12-30 LAB — URINE MICROSCOPIC-ADD ON

## 2013-12-30 LAB — KETONES, QUALITATIVE: Acetone, Bld: NEGATIVE

## 2013-12-30 LAB — PHOSPHORUS: Phosphorus: 2.8 mg/dL (ref 2.3–4.6)

## 2013-12-30 MED ORDER — NITROGLYCERIN 0.4 MG SL SUBL: 0.4000 mg | SUBLINGUAL_TABLET | SUBLINGUAL | Status: DC | PRN

## 2013-12-30 MED ORDER — SODIUM CHLORIDE 0.9 % IV SOLN
INTRAVENOUS | Status: AC
Start: 2013-12-30 — End: 2013-12-30
  Administered 2013-12-30: 19:00:00 via INTRAVENOUS

## 2013-12-30 MED ORDER — INSULIN ASPART 100 UNIT/ML ~~LOC~~ SOLN
0.0000 [IU] | Freq: Three times a day (TID) | SUBCUTANEOUS | Status: DC
Start: 2013-12-31 — End: 2013-12-31
  Administered 2013-12-31 (×2): 11 [IU] via SUBCUTANEOUS

## 2013-12-30 MED ORDER — ONDANSETRON HCL 4 MG/2ML IJ SOLN: 4.0000 mg | Freq: Four times a day (QID) | INTRAMUSCULAR | Status: DC | PRN

## 2013-12-30 MED ORDER — SODIUM CHLORIDE 0.9 % IV SOLN
INTRAVENOUS | Status: DC
  Filled 2013-12-30: qty 1

## 2013-12-30 MED ORDER — ALBUTEROL SULFATE HFA 108 (90 BASE) MCG/ACT IN AERS: 2.0000 | INHALATION_SPRAY | Freq: Four times a day (QID) | RESPIRATORY_TRACT | Status: DC | PRN

## 2013-12-30 MED ORDER — DEXTROSE 50 % IV SOLN: 25.0000 mL | INTRAVENOUS | Status: DC | PRN

## 2013-12-30 MED ORDER — HEPARIN SODIUM (PORCINE) 5000 UNIT/ML IJ SOLN
5000.0000 [IU] | Freq: Three times a day (TID) | INTRAMUSCULAR | Status: DC
  Administered 2013-12-30 – 2014-01-03 (×11): 5000 [IU] via SUBCUTANEOUS
  Filled 2013-12-30 (×16): qty 1

## 2013-12-30 MED ORDER — TRAMADOL HCL 50 MG PO TABS
50.0000 mg | ORAL_TABLET | Freq: Once | ORAL | Status: AC
  Administered 2013-12-30: 50 mg via ORAL
  Filled 2013-12-30: qty 1

## 2013-12-30 MED ORDER — MOMETASONE FURO-FORMOTEROL FUM 100-5 MCG/ACT IN AERO
2.0000 | INHALATION_SPRAY | Freq: Two times a day (BID) | RESPIRATORY_TRACT | Status: DC
  Administered 2013-12-30 – 2014-01-03 (×8): 2 via RESPIRATORY_TRACT
  Filled 2013-12-30: qty 8.8

## 2013-12-30 MED ORDER — ALBUTEROL SULFATE (2.5 MG/3ML) 0.083% IN NEBU: 2.5000 mg | INHALATION_SOLUTION | Freq: Four times a day (QID) | RESPIRATORY_TRACT | Status: DC | PRN

## 2013-12-30 MED ORDER — SODIUM CHLORIDE 0.9 % IV SOLN
INTRAVENOUS | Status: DC
  Administered 2013-12-30: 20:00:00 via INTRAVENOUS

## 2013-12-30 MED ORDER — INSULIN GLARGINE 100 UNIT/ML ~~LOC~~ SOLN
20.0000 [IU] | Freq: Once | SUBCUTANEOUS | Status: AC
  Administered 2013-12-31: 20 [IU] via SUBCUTANEOUS
  Filled 2013-12-30 (×2): qty 0.2

## 2013-12-30 MED ORDER — SODIUM CHLORIDE 0.9 % IV BOLUS (SEPSIS)
1000.0000 mL | Freq: Once | INTRAVENOUS | Status: AC
  Administered 2013-12-30: 1000 mL via INTRAVENOUS

## 2013-12-30 MED ORDER — DEXTROSE-NACL 5-0.45 % IV SOLN: INTRAVENOUS | Status: DC

## 2013-12-30 MED ORDER — POTASSIUM CHLORIDE 10 MEQ/100ML IV SOLN
10.0000 meq | INTRAVENOUS | Status: DC
  Filled 2013-12-30 (×2): qty 100

## 2013-12-30 MED ORDER — TIMOLOL MALEATE 0.5 % OP SOLN
1.0000 [drp] | Freq: Three times a day (TID) | OPHTHALMIC | Status: DC
  Administered 2013-12-30 – 2014-01-03 (×10): 1 [drp] via OPHTHALMIC
  Filled 2013-12-30 (×3): qty 5

## 2013-12-30 NOTE — Progress Notes (Signed)
Patient ID: [name], female   DOB: [birth year], 58 y.o.   MRN: [id number] Discussed patient with Dr. Eula Fried who reports patient is at home with Home health nurse. Her blood sugar read Hi on her meter ( >600mg /dl) Nurse gave patient 2 units rapid acting insulin then Dr. Eula Fried ordered another 10 units that was given~ 20 minutes ago.  Recommend giving another 4-5 units correction insulin to lower her blood sugar to ~ 150-200 mg/dl by 5 Pm tonight. Calculations: 91kg x 0.5- 1.2 u/kg = 45-108 unit/day is TDD. Current TDD is ~ 52 units/day ( 34 lantus and 6 units TID of rapid acting. Correction factor is 25-30 ( 1800/~ 60 units used as TDD as last A1C was 1>14)  12 units rapid acting insulin with acting time of 4 hours, it should take until ~ 4 Pm tonight blood sugar should be lowered. Will also need to push fluids- 1-2 liters minimum  Good option might be that patient take taxi to our office and be given bus pass home so we can monitor her sugars, vital signs  and labs here in the office.

## 2013-12-30 NOTE — Assessment & Plan Note (Addendum)
Patient has hx of poorly controlled Type 2 DM with 2 recent hospitalizations (7/15 and 7/23) for HHS. Patient presented to clinic with blood glucose >500. She has nausea, lethargy, polydipsia, polyuria, dizziness. CMP shows Na 126 K 4.2 CO2 21 Glucose 642. - Admit to tele bed - f/u appt upon discharge from hospital

## 2013-12-30 NOTE — Progress Notes (Signed)
Date: 12/30/2013               Patient Name:  [name] MRN: [id number]  DOB: 1956/05/14 Age / Sex: 58 y.o., female   PCP: Charlott Rakes, MD         Medical Service: Internal Medicine Teaching Service         Attending Physician: Dr. Axel Filler, MD    First Contact: Nadine Counts Pager: [phone]  Second Contact: Dr. Alice Rieger Pager: [phone]       After Hours (After 5p/  First Contact Pager: [phone]  weekends / holidays): Second Contact Pager: [phone]   Chief Complaint: Feeling tired  History of Present Illness: [name] is a 58 year old female with hx of uncontrolled DM2, substance abuse (with recent cocaine), HTN, and depression presenting to opc from home with persistently high blood sugars. Her meter has been reading "HIGH" all day.  [facility] was at her home today and called the on call pager to address her hyperglycemia.  She was also noted to feel tired and "groggy" with blurry vision, polyuria, and polydipsia.  She has apparently not been taking her prescribed dose of lantus and novolog and likely is not following a controlled diabetic diet but says she only had egg and fruit today and low carbs. She was given a total of 24 units of novolog today prior to opc visit and she drank ~5 bottles of 16 oz water prior to arrival along with her lantus 34 units the night before. She reports her cbg's at home since hospital discharge usually in the 300-500s.   She denies chest pain, vomiting, sob, fever, or chills. Last cocaine use was over the weekend.   Meds: Current Facility-Administered Medications  Medication Dose Route Frequency Provider Last Rate Last Dose  . 0.9 %  sodium chloride infusion   Intravenous Continuous Wilber Oliphant, MD      . 0.9 %  sodium chloride infusion   Intravenous Continuous Wilber Oliphant, MD      . albuterol (PROVENTIL HFA;VENTOLIN HFA) 108 (90 BASE) MCG/ACT inhaler 2 puff  2 puff Inhalation Q6H PRN Wilber Oliphant, MD      . dextrose 5  %-0.45 % sodium chloride infusion   Intravenous Continuous Wilber Oliphant, MD      . dextrose 50 % solution 25 mL  25 mL Intravenous PRN Wilber Oliphant, MD      . heparin injection 5,000 Units  5,000 Units Subcutaneous 3 times per day Wilber Oliphant, MD      . insulin regular (NOVOLIN R,HUMULIN R) 1 Units/mL in sodium chloride 0.9 % 100 mL infusion   Intravenous Continuous Wilber Oliphant, MD      . mometasone-formoterol (DULERA) 100-5 MCG/ACT inhaler 2 puff  2 puff Inhalation BID Wilber Oliphant, MD      . nitroGLYCERIN (NITROSTAT) SL tablet 0.4 mg  0.4 mg Sublingual Q5 min PRN Wilber Oliphant, MD      . potassium chloride 10 mEq in 100 mL IVPB  10 mEq Intravenous Q1H Wilber Oliphant, MD       Facility-Administered Medications Ordered in Other Encounters  Medication Dose Route Frequency Provider Last Rate Last Dose  . sodium chloride 0.9 % bolus 1,000 mL  1,000 mL Intravenous Once Albin Felling, MD   1,000 mL at 12/30/13 1738   Allergies: Allergies as of 12/30/2013 - Review Complete 12/30/2013  Allergen Reaction Noted  .  Metformin Diarrhea 01/02/2013  . Acetaminophen Nausea And Vomiting 09/02/2013  . Hydrocodone-acetaminophen Hives    Past Medical History  Diagnosis Date  . Asthma     past 10 years  . Diabetes mellitus     past 5 years  . Glaucoma     dx 06/2011-[facility]  . Hypertension   . Hyperlipidemia   . Hyperthyroidism   . History of TIA (transient ischemic attack)     01/06/2011, x2  . Pancreatitis   . Gout   . Depression   . Renal disorder     chronic kidney disease, stage 3  . Chronic pain   . Glaucoma   . Arthritis     djd -lower back, left knee  . Degenerative disc disease   . Bipolar disorder   . Gastritis   . COPD (chronic obstructive pulmonary disease)   . Schizophrenia   . 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 Fatty liver     mild CT 06/2012  . DKA (diabetic ketoacidoses) 07/21/2013  . Stroke     TIAs  . Esophageal ring,  acquired 08/12/2013  . Cocaine abuse   . Chronic abdominal pain    Past Surgical History  Procedure Laterality Date  . Abdominal hysterectomy  1990    still has ovaries   . Colonoscopy    . Refractive surgery      both eyes   Family History  Problem Relation Age of Onset  . Arthritis Mother   . Diabetes Mother   . Hypertension Mother   . Kidney disease Mother   . Heart disease Mother     died 17  . Heart disease      mother & father  . Hypertension      mother & father  . Colon cancer Neg Hx   . Prostate cancer Neg Hx   . Breast cancer Neg Hx   . Stomach cancer Neg Hx   . Esophageal cancer Neg Hx   . Migraines Neg Hx   . Hypertension Sister   . Hypertension Brother   . Kidney disease Brother    History   Social History  . Marital Status: Single    Spouse Name: N/A    Number of Children: 3  . Years of Education: college   Occupational History  . Disability    Social History Main Topics  . Smoking status: Current Every Day Smoker -- 0.30 packs/day for 30 years    Types: Cigarettes  . Smokeless tobacco: Never Used     Comment: smokes 2 -3 cigs daily/uses patches.  . Alcohol Use: No     Comment: denied alcohol at this time  . Drug Use: Yes    Special: Cocaine     Comment: "a little'  . Sexual Activity: Not on file     Comment: hysterectomy   Other Topics Concern  . Not on file   Social History Narrative   On disability   Single 3 sons   3 coffee/day   3 regular pepsi/day   Updated 08/11/2013                  Review of Systems:  Constitutional:  Fatigue  HEENT:  Denies congestion   Respiratory:  Denies SOB  Cardiovascular:  Denies palpitations and chest pain  Gastrointestinal:  Nausea and some abdominal pain. Denies Vomiting.   Genitourinary:  Polyuria. Denies dysuria  Musculoskeletal:  Chronic pain  Skin:  Denies pallo  Neurological:  Denies syncope.    Physical Exam: Blood pressure 106/72, pulse 59, temperature 97.4 F (36.3 C),  temperature source Oral, resp. rate 20, height 5\' 2"  (1.575 m), weight 232 lb 14.4 oz (105.643 kg), SpO2 96.00%. Vitals reviewed. General: sitting in chair, talking slow, tired appearing, NAD HEENT: EOMI Cardiac: RRR Pulm: clear to auscultation bilaterally Abd: soft, obese, mild tenderness to deep palpation epigastric region, BS present Ext: warm and well perfused, moving all 4 extremities Neuro: alert and oriented X3 but tired, cranial nerves II-XII grossly intact, generalized weakness and strength, sensation grossly intact  Lab results: Basic Metabolic Panel:  Recent Labs  12/30/13 1521  NA 126*  K 4.2  CL 89*  CO2 21  GLUCOSE 642*  BUN 19  CREATININE 1.35*  CALCIUM 9.0   Liver Function Tests:  Recent Labs  12/30/13 1521  AST 16  ALT 20  ALKPHOS 138*  BILITOT 0.3  PROT 7.0  ALBUMIN 3.5   CBG:  Recent Labs  12/30/13 1506 12/30/13 1817  GLUCAP >600* 447*   Urine Drug Screen: Drugs of Abuse     Component Value Date/Time   LABOPIA NONE DETECTED 12/18/2013 0204   LABOPIA NEG 01/23/2013 1507   COCAINSCRNUR POSITIVE* 12/18/2013 0204   COCAINSCRNUR NEG 01/23/2013 1507   LABBENZ NONE DETECTED 12/18/2013 0204   LABBENZ NEG 01/23/2013 1507   AMPHETMU NONE DETECTED 12/18/2013 0204   THCU NONE DETECTED 12/18/2013 0204   LABBARB NONE DETECTED 12/18/2013 0204   LABBARB NEG 01/23/2013 1507    Assessment & Plan by Problem: Principal Problem:   DKA, type 2, not at goal Active Problems:   Hypothyroidism   AKI (acute kidney injury)   Chronic back pain   Cocaine abuse  DKA vs HSS in setting of uncontrolled DM2 and incorrect diet and insulin regimen--also likely exacerbated in setting of substance abuse with cocaine. CBGs >600 today despite taking lantus last night and ~28 units of Novolog. AG 16 on admission, symptomatic with fatigue, polyuria, and polydipsia. Corrected Na 135. Last similar hospital admissions last month for HHS. Latest insulin regimen was recommended to be  Lantus 36 units qhs and novolog 8 units TID.  -no step down beds available, will admit to tele -NS bolus started in [facility], will give at least another NS bolus as that will definitely help with her hyperglycemia -BMET's q2, repeat next now and assess AG. If still elevated would favor starting Insulin gtt given symptoms and persistent hyperglycemia, however, if improving, may just be able to hydrate aggressive and treat with [place] insulin -supplement K while on insulin gtt -maintain NPO -u/a, uds -zofran prn -serum ketones pending  Acute on chronic CKD 2: Cr up to 1.35 likely pre-renal in setting of hypovolemia with DKA.  -aggressive fluid hydration -trend renal function  Substance abuse--last use of cocaine over the weekend. Strongly encouraged cessation.  -uds pending  Hypothyroidism--last TSH 93 10/2013 and synthroid dose was increased to 73mcg.  -f/u TSH -continue synthroid when taking PO  Chronic back pain: on tramadol at home but holding given lethargy on admission.   HTN--normotensive on admission. Home medications include lisinopril 40mg  and norvasc 5mg .  -holding for now given lower bp and AKI, will gradually resume given further clinical improvement  Diet: NPO Dvt Ppx: Heparin Dispo: Disposition is deferred at this time, awaiting improvement of current medical problems. Anticipated discharge in approximately 1-2 day(s).   The patient does have a current PCP (Charlott Rakes, MD) and does need an [facility] hospital follow-up appointment  after discharge.  The patient does have transportation limitations that hinder transportation to clinic appointments.  Signed: Wilber Oliphant, MD 12/30/2013, 6:41 PM

## 2013-12-30 NOTE — Progress Notes (Signed)
Called lab. Still waiting on them to draw and result pt's bmet

## 2013-12-30 NOTE — Progress Notes (Signed)
Paged provider on call about pt's complaint of pain as well as about starting pt's insulin drip. RN to continue to monitor pt's CBG and wait for results from BMET before starting insulin drip. MD to come see pt at bedside regarding pain medications.

## 2013-12-30 NOTE — Progress Notes (Signed)
   Subjective:    Patient ID: [name], female    DOB: 1955/11/24, 58 y.o.   MRN: [id number]  HPI [name] is a 58yo woman w/ PMHx of HTN, poorly controlled Type 2 DM (last HbA1c 14.8 on 12/07/13), bipolar disorder, and polysubstance abuse who presents to the clinic with a severely elevated blood glucose. Pt was hospitalized for HHS on 7/15 and 7/23. During her last admission, her insulin regimen was changed from Lantus 34 units to Lantus 36 units QHS and from Novolog 6 units TID to Novolog 8 units TID. Pt has not been using the corrected dosages since discharge from the hospital because she states she was not told of these changes. However, she had a follow-up clinic appointment with Dr. Posey Pronto on 12/21/13 and her insulin regimen was reviewed.   This AM, patient had BG 569. Pt reports she took her bedtime Lantus 34 units last night and Novolog 6 units at breakfast and lunch today. She states she had oatmeal and orange juice for breakfast this AM. Her home care RN retested her BG when she arrived and the reading was "HIGH." The RN gave an additional 2 units Novolog to equal 8 units for the lunch dose. At 12:45PM today, Dr. Eula Fried was contacted by the home care RN about the elevated blood sugars. Dr. Eula Fried instructed the RN to give an additional 10 units Novolog. Her total insulin was 24 units at this point. Case was then discussed with attending, Dr. Lynnae January and CDE, [name], who recommended an additional 4 units Novolog, which the patient received, for a total of 28 units Novolog given today.  Patient has been actively drinking water, with total of 5 (20 oz) bottles of water finished. Patient complains of blurry vision, nausea, lethargy, dizziness, polydipsia, and polyuria. Patient reports that she has been eating healthier, with cutbacks in sweets and fast food, and avoiding potatoes. She states she has increased her intake of veggies, fruits, and whole grains. Patient was asked to demonstrate  how she uses her insulin pens and she used them correctly. Blood glucose was taken in clinic at 3:05PM and was >600. CMET was ordered and shows Na 126 K 4.2 Cl 89 CO2 21 BUN 19 Cr 1.35 Glucose 642 TBili 0.3 Alk Phos 138 AST 16 ALT 20 TProtein 7.0 Albumin 3.5 Ca 9.0. Repeat fingerstick reading at 4PM is still "HIGH."    Review of Systems General: Denies changes in weight, night sweats HEENT: Denies headache, ear pain, rhinorrhea, sore throat CV: Denies CP, palpitations, orthopnea Pulm: Denies SOB, cough GI: Denies abdominal pain, diarrhea, constipation, dysphagia GU: Denies dysuria, hematuria Msk: Denies muscle cramps, joint pains Neuro: Denies weakness Skin: Denies rashes, bruises    Objective:   Physical Exam General: sick appearing, lethargic HEENT: [place]/AT, EOMI, mucus membranes dry Neck: supple, no JVD CV: RRR, normal S1/S2, no m/g/r Pulm: CTA bilaterally, breaths non-labored Abd: BS+, soft, obese, non-tender Ext: moves all, warm, no edema Neuro: cooperative, lethargic, CNs II-XII intact      Assessment & Plan:

## 2013-12-30 NOTE — Progress Notes (Signed)
Waiting for lab results before hanging pt's potassium and started pt's insulin drip per physicians.

## 2013-12-30 NOTE — Progress Notes (Signed)
Report called to Nurse on [address].  Pt transported via wheelchair to [place].  Pt remains alert and oriented.  IV infusing NS in right hand.  Sander Nephew, RN 12/30/2013 5:53 PM

## 2013-12-30 NOTE — Telephone Encounter (Signed)
Reason for call:   I received a call from [name], [facility] RN on behalf of [name] at 12:46 PM indicating that [name] has high blood sugars again. Apparently, [name] has not been taking her prescribed insulin regimen. She should beo n Lantus 36 units qhs and novolog 8 units tid, however, she has been taking 34 units qhs and 6 units TID. Today, she reports taking 6 units with breakfast and lunch and at 11 am her cbg was 569. Since RN arrival, the meter has read high, even after the RN gave her 2 more units of novolog to try to equal total 8 units in the afternoon.    Pertinent Data:   Given 2 more units of novolog around 1230pm by RN, total 14 units by the time of call, meter still reading high  I advised her to give [name] another 10 units at that time ~1250pm for total of 24 units today.   [name] is actively drinking water, finished at least 2 bottles so far, feeling thirsty, "groggy" and some blurry vision. She has a chronic non-productive cough and denies any urinary complaints. She admits to drinking juice, fruit, eggs, and sandwich for breakfast and lunch so far today. She is talking clearly to RN and following instructions, but can feel her sugar is high.   Last hospital admissions for HHS with discharge on 12/07/13 and 7/25 likely 2/2 inappropriate diet and/or drug use (cocaine UDS positive 7/24)--at that time, novolog 6 units tid and lantus 36 units qhs was prescribed  Subsequent hospital follow up visit with PCP Dr. Posey Pronto on 7/27 with increase in novolog to 8 units TID.  I have discussed the case briefly with Dr. Lynnae January and then discussed insulin correction with clinic CDE, [name]. Estimating correction of drop in sugar by 50 with 2 units of novolog. Thus, will advise RN to give 4 more units of novolog at this time and continue to drink fluids for total of 28 units.   Last blood sugar check around 130pm, still read HIGH per RN.  Upon review of prior  CBG's all >250 per RN. Likely will need increase in insulin regimen and further diabetic education.    Assessment / Plan / Recommendations:   [name] is a 58 year old female with uncontrolled DM2 (last A1C 14.8) and substance abuse with recent hospital admissions for HHS called by RN for persistent symptomatic hyperglycemia. Last meter reading was "high". Likely has recurrent HHS vs. DKA.   After discussing with RN, [name] can have a ride and come in to opc for further management that may possibly lead to hospital admission. This is advised instead of going directly to the emergency room if possible.   I have discussed the case further with Dr. Eppie Gibson and there is confirmed clinic schedule availability per [name] who will add her to the schedule at this time.   If she does not need to be admitted, the clinic can try to help her with transportation assistance for return to home if possible but patient may also have family members off work at that time.   RN is advised to have patient continue to drink water and give the last 4 units of novolog as explained above in efforts to try to bring her blood sugar down.   Case is also discussed with PCP and I will route him the note as well.   As always, pt is advised that  if symptoms worsen or new symptoms arise, they should go to an urgent care facility or to to ER for further evaluation.   Wilber Oliphant, MD   12/30/2013, 1:43 PM

## 2013-12-30 NOTE — Progress Notes (Signed)
[name] [id number] Code Status: full   Admission Data: 12/30/2013 6:29 PM Attending Provider:  Marinda Elk JKD:TOIZT, Fayrene Fearing, MD Consults/ Treatment Team:    [name] is a 58 y.o. female patient admitted from ED awake, alert - oriented  X 3 - no acute distress noted.  VSS - Blood pressure 106/72, pulse 59, temperature 97.4 F (36.3 C), temperature source Oral, resp. rate 20, height 5\' 2"  (1.575 m), weight 105.643 kg (232 lb 14.4 oz), SpO2 96.00%.    IV in place, occlusive dsg intact without redness.  Orientation to room, and floor completed with information packet given to patient/family.  Patient declined safety video at this time.  Admission INP armband ID verified with patient/family, and in place.   SR up x 2, fall assessment complete, with patient and family able to verbalize understanding of risk associated with falls, and verbalized understanding to call nsg before up out of bed.  Call light within reach, patient able to voice, and demonstrate understanding.  Skin, clean-dry- intact without evidence of bruising, or skin tears.   No evidence of skin break down noted on exam.     Will cont to eval and treat per MD orders.  Gracy Bruins, RN 12/30/2013 6:29 PM

## 2013-12-30 NOTE — Progress Notes (Signed)
Patient requesting pain meds for her chronic hip pain. Was drowsy earlier. Discussed with nurse who agrees that patient is more alert.   Patient is awake and alert. Not somnolent. NAD  Will give patient home tramadol 50mg  once for pain.

## 2013-12-30 NOTE — H&P (Signed)
  I have seen and examined the patient myself, and I have reviewed the note by Nadine Counts, MS 4 and was present during the interview and physical exam.  Please see my separate H&P for additional findings, assessment, and plan.   Signed: Wilber Oliphant, MD 12/30/2013, 8:08 PM

## 2013-12-30 NOTE — H&P (Signed)
Date: 12/30/2013               Patient Name:  [name] MRN: [id number]  DOB: [birth year] Age / Sex: 58 y.o., female   PCP: Charlott Rakes, MD              Medical Service: Internal Medicine Teaching Service              Attending Physician: Dr. Axel Filler, MD    First Contact: Nadine Counts, MS4 Pager: [phone]  Second Contact: Dr. Alice Rieger Pager: [phone]            After Hours (After 5p/  First Contact Pager: [phone]  weekends / holidays): Second Contact Pager: [phone]   Chief Complaint: Feeling tired  History of Present Illness: [name] is a 58 year old female with PMH of poorly controlled insulin-dependent T2DM, HTN, hypothyroidism, depression/anxiety, bipolar disorder, chronic pain, and polysubstance abuse (cocaine and benzodiazepines) who was a direct admit from clinic for elevated blood glucose levels, nausea, and blurry vision. Today, she reports taking 6 units with breakfast and lunch and at 11 am her blood glucose was 569. Her home RN re-checked her blood glucose that read "High", even after the RN gave her 2 more units of novolog to try to equal total 8 units in the afternoon. She reports that she has been taking 6 units of Novolog with meals and 34 units of Lantus last night, which is below her recommended 8 units of Novolog and 36 units of Lantus. Her blood glucose levels have been running between 400-500s since her discharge on 7/24. She last used cocaine one week ago. Endorses nausea, epigastric pain, blurriness of vision, lethargy, slow mentation, dizziness, polyuria, and peripheral neuropathy. Denies vomiting, headaches, and pain with urination. She was recently hospitalized (7/23-7/24 and 7/12-7/13) for HHS.  Meds: Current Facility-Administered Medications  Medication Dose Route Frequency Provider Last Rate Last Dose  . 0.9 %  sodium chloride infusion   Intravenous Continuous Wilber Oliphant, MD      . 0.9 %  sodium chloride infusion   Intravenous Continuous  Wilber Oliphant, MD      . albuterol (PROVENTIL HFA;VENTOLIN HFA) 108 (90 BASE) MCG/ACT inhaler 2 puff  2 puff Inhalation Q6H PRN Wilber Oliphant, MD      . dextrose 5 %-0.45 % sodium chloride infusion   Intravenous Continuous Wilber Oliphant, MD      . dextrose 50 % solution 25 mL  25 mL Intravenous PRN Wilber Oliphant, MD      . heparin injection 5,000 Units  5,000 Units Subcutaneous 3 times per day Wilber Oliphant, MD      . insulin regular (NOVOLIN R,HUMULIN R) 1 Units/mL in sodium chloride 0.9 % 100 mL infusion   Intravenous Continuous Wilber Oliphant, MD      . mometasone-formoterol (DULERA) 100-5 MCG/ACT inhaler 2 puff  2 puff Inhalation BID Wilber Oliphant, MD      . nitroGLYCERIN (NITROSTAT) SL tablet 0.4 mg  0.4 mg Sublingual Q5 min PRN Wilber Oliphant, MD      . potassium chloride 10 mEq in 100 mL IVPB  10 mEq Intravenous Q1H Wilber Oliphant, MD       Facility-Administered Medications Ordered in Other Encounters  Medication Dose Route Frequency Provider Last Rate Last Dose  . sodium chloride 0.9 % bolus 1,000 mL  1,000 mL Intravenous Once Albin Felling, MD   1,000 mL at 12/30/13 1738  Allergies: Allergies as of 12/30/2013 - Review Complete 12/30/2013  Allergen Reaction Noted  . Metformin Diarrhea 01/02/2013  . Acetaminophen Nausea And Vomiting 09/02/2013  . Hydrocodone-acetaminophen Hives    Past Medical History  Diagnosis Date  . Asthma     past 10 years  . Diabetes mellitus     past 5 years  . Glaucoma     dx 06/2011-[facility]  . Hypertension   . Hyperlipidemia   . Hyperthyroidism   . History of TIA (transient ischemic attack)     01/06/2011, x2  . Pancreatitis   . Gout   . Depression   . Renal disorder     chronic kidney disease, stage 3  . Chronic pain   . Glaucoma   . Arthritis     djd -lower back, left knee  . Degenerative disc disease   . Bipolar disorder   . Gastritis   . COPD (chronic obstructive pulmonary disease)   .  Schizophrenia   . 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 Fatty liver     mild CT 06/2012  . DKA (diabetic ketoacidoses) 07/21/2013  . Stroke     TIAs  . Esophageal ring, acquired 08/12/2013  . Cocaine abuse   . Chronic abdominal pain    Past Surgical History  Procedure Laterality Date  . Abdominal hysterectomy  1990    still has ovaries   . Colonoscopy    . Refractive surgery      both eyes   Family History  Problem Relation Age of Onset  . Arthritis Mother   . Diabetes Mother   . Hypertension Mother   . Kidney disease Mother   . Heart disease Mother     died 1  . Heart disease      mother & father  . Hypertension      mother & father  . Colon cancer Neg Hx   . Prostate cancer Neg Hx   . Breast cancer Neg Hx   . Stomach cancer Neg Hx   . Esophageal cancer Neg Hx   . Migraines Neg Hx   . Hypertension Sister   . Hypertension Brother   . Kidney disease Brother    History   Social History  . Marital Status: Single    Spouse Name: N/A    Number of Children: 3  . Years of Education: college   Occupational History  . Disability    Social History Main Topics  . Smoking status: Current Every Day Smoker -- 0.30 packs/day for 30 years    Types: Cigarettes  . Smokeless tobacco: Never Used     Comment: smokes 2 -3 cigs daily/uses patches.  . Alcohol Use: No     Comment: denied alcohol at this time  . Drug Use: Yes    Special: Cocaine     Comment: "a little'  . Sexual Activity: Not on file     Comment: hysterectomy   Other Topics Concern  . Not on file   Social History Narrative   On disability   Single 3 sons   3 coffee/day   3 regular pepsi/day   Updated 08/11/2013                  Review of Systems: Pertinent items are noted in HPI.  Physical Exam: BP 106/72  Pulse 59  Temp(Src) 97.4 F (36.3 C) (Oral)  Resp 20  Ht 5\' 2"  (1.575 m)  Wt 105.643 kg (232 lb 14.4  oz)  BMI 42.59 kg/m2  SpO2 96% General appearance: slowed mentation,  lethargic, no distress Head: Normocephalic, without obvious abnormality, atraumatic Eyes: negative findings: pupils equal, round, reactive to light and accomodation, positive findings: opaque lens, unable to visualize fundus Lungs: clear to auscultation bilaterally Heart: regular rate and rhythm, S1, S2 normal, no murmur, click, rub or gallop Abdomen: abnormal findings:  obese and epigastric tenderness to palpation Neuro: CN II-XII intact, 5/5 strength throughout  Lab results: Basic Metabolic Panel:  Recent Labs  12/30/13 1521  NA 126*  K 4.2  CL 89*  CO2 21  GLUCOSE 642*  BUN 19  CREATININE 1.35*  CALCIUM 9.0   Liver Function Tests:  Recent Labs  12/30/13 1521  AST 16  ALT 20  ALKPHOS 138*  BILITOT 0.3  PROT 7.0  ALBUMIN 3.5   No results found for this basename: LIPASE, AMYLASE,  in the last 72 hours No results found for this basename: AMMONIA,  in the last 72 hours CBC: No results found for this basename: WBC, NEUTROABS, HGB, HCT, MCV, PLT,  in the last 72 hours Cardiac Enzymes: No results found for this basename: CKTOTAL, CKMB, CKMBINDEX, TROPONINI,  in the last 72 hours BNP: No results found for this basename: PROBNP,  in the last 72 hours D-Dimer: No results found for this basename: DDIMER,  in the last 72 hours CBG:  Recent Labs  12/30/13 1506 12/30/13 1817  GLUCAP >600* 447*   Hemoglobin A1C: No results found for this basename: HGBA1C,  in the last 72 hours Fasting Lipid Panel: No results found for this basename: CHOL, HDL, LDLCALC, TRIG, CHOLHDL, LDLDIRECT,  in the last 72 hours Thyroid Function Tests: No results found for this basename: TSH, T4TOTAL, FREET4, T3FREE, THYROIDAB,  in the last 72 hours Anemia Panel: No results found for this basename: VITAMINB12, FOLATE, FERRITIN, TIBC, IRON, RETICCTPCT,  in the last 72 hours Coagulation: No results found for this basename: LABPROT, INR,  in the last 72 hours Urine Drug Screen: Drugs of Abuse       Component Value Date/Time   LABOPIA NONE DETECTED 12/18/2013 0204   LABOPIA NEG 01/23/2013 1507   COCAINSCRNUR POSITIVE* 12/18/2013 0204   COCAINSCRNUR NEG 01/23/2013 1507   LABBENZ NONE DETECTED 12/18/2013 0204   LABBENZ NEG 01/23/2013 1507   AMPHETMU NONE DETECTED 12/18/2013 0204   THCU NONE DETECTED 12/18/2013 0204   LABBARB NONE DETECTED 12/18/2013 0204   LABBARB NEG 01/23/2013 1507    Alcohol Level: No results found for this basename: ETH,  in the last 72 hours Urinalysis: No results found for this basename: COLORURINE, APPERANCEUR, LABSPEC, PHURINE, GLUCOSEU, HGBUR, BILIRUBINUR, KETONESUR, PROTEINUR, UROBILINOGEN, NITRITE, LEUKOCYTESUR,  in the last 72 hours  Imaging results:  No results found.  Assessment & Plan by Problem: Active Problems:   DKA, type 2, not at goal  [name] is a 58 year old female with PMH of poorly controlled insulin-dependent T2DM, HTN, hypothyroidism, depression/anxiety, bipolar disorder, chronic pain, and polysubstance abuse (cocaine and benzodiazepines) who presented with DKA vs. HHS.   DKA vs. HHS: Pt has known history of Type II DM with neuropathy and history of hyperglycemic events from medical non-compliance and inappropriate diet. Pt presents from the clinic for hyperglycemia. Blood glucose 642 on admission. Na 126, K 4.3, Cl 89, bicarb 21, and an anion gap of 16. Hyperglycemia likely 2/2 inappropriate diet or drug use. Pt admits to cocaine use 1 week ago. Pt adamantly states she has been taking her medication  as prescribed. Pts diet is inappropriate for her uncontrolled diabetes. It is unclear whether the patient truly does not understand what she should be eating or whether she is just noncompliant. Last hemoglobin A1C was 14.8 from 12/07/2013. Pt takes Novolog 6 units TID before meals and Lantus 34 units at bedtime. Pt was given Novolog 8 units in the clinic and 10 units on admission.  -NS 1L bolus and continuous 125 mL/hr  -Start insulin drip -BG  monitoring Q2H  -Neurontin 300 mg TID for neuropathy  -NPO  -I/Os  -Consult to diabetes coordinator   COPD: Pt pulse ox is 97% on room air. No complaint of shortness of breath. Takes albuterol 2 puffs Q6H prn wheezing and dulera 2 puffs BID.  -Continue albuterol 2 puffs Q6H prn wheezing and dulera 2 puffs BID   CKD stage 3: Cr 1.35 and GFR 65. Pt is on lisinopril 40 mg daily at home.  -Continue lisinopril 40 mg   Hypothyroidism: Most recent TSH 93.15 on 10/27/2013. Most recent free T4 0.36 on 07/21/13. On synthroid 75 mcg daily at home.  -Stable  -Continue synthroid 75 mcg daily   TIA x 2 in 2012: No complaints of weakness or neurologic deficits. No focal deficits on exam. On amlodipine 5 mg daily, aspirin 81 mg daily, atorvastatin 80 mg daily, and lisinopril 40 mg daily at home.  -Continue amlodipine 5 mg daily, aspirin 81 mg daily, atorvastatin 80 mg daily, and lisinopril 40 mg daily   Bipolar disorder with depression and anxiety: On alprazolam 0.5 mg TID daily, depakote 1000 mg daily, and sertraline 100 mg daily.  -Continue xanax 0.5 mg TID daily, depakote1000 mg daily, and sertraline 100 mg daily  -Stable   Substance abuse: Pt admits to cocaine use. Last use was approximately 1 week ago. She denies any alcohol use.  -Urine Drug Screen  -Consult to social work   Gout: Last gout flare was less than 1 year ago. On allopurinol 300 mg daily and colchicine 0.6 mg daily prn gout flare.  -Stable  -Continue allopurinol 300 mg  -Hold colchicine 0.6 mg daily prn gout flare   DVT/PE ppx: Lovenox 40 mg SQ daily   Dispo: Disposition is deferred at this time, awaiting improvement of current medical problems. Anticipated discharge in approximately 1-2 day(s).   This is a Careers information officer Note.  The care of the patient was discussed with Dr. Eula Fried and the assessment and plan was formulated with their assistance.  Please see their note for official documentation of the patient encounter.    Signed: Ulice Brilliant, Med Student 12/30/2013, 6:44 PM

## 2013-12-31 ENCOUNTER — Other Ambulatory Visit: Payer: Self-pay

## 2013-12-31 DIAGNOSIS — N179 Acute kidney failure, unspecified: Secondary | ICD-10-CM

## 2013-12-31 DIAGNOSIS — E1165 Type 2 diabetes mellitus with hyperglycemia: Secondary | ICD-10-CM

## 2013-12-31 DIAGNOSIS — IMO0001 Reserved for inherently not codable concepts without codable children: Secondary | ICD-10-CM

## 2013-12-31 DIAGNOSIS — F1910 Other psychoactive substance abuse, uncomplicated: Secondary | ICD-10-CM

## 2013-12-31 DIAGNOSIS — N182 Chronic kidney disease, stage 2 (mild): Secondary | ICD-10-CM

## 2013-12-31 DIAGNOSIS — M549 Dorsalgia, unspecified: Secondary | ICD-10-CM

## 2013-12-31 DIAGNOSIS — I129 Hypertensive chronic kidney disease with stage 1 through stage 4 chronic kidney disease, or unspecified chronic kidney disease: Secondary | ICD-10-CM

## 2013-12-31 DIAGNOSIS — E039 Hypothyroidism, unspecified: Secondary | ICD-10-CM

## 2013-12-31 DIAGNOSIS — G8929 Other chronic pain: Secondary | ICD-10-CM

## 2013-12-31 LAB — TROPONIN I: Troponin I: <0.3 ng/mL (ref <0.30)

## 2013-12-31 LAB — BASIC METABOLIC PANEL
Anion gap: 15 (ref 5–15)
BUN: 15 mg/dL (ref 6–23)
CO2: 21 mEq/L (ref 19–32)
Calcium: 8 mg/dL — ABNORMAL LOW (ref 8.4–10.5)
Chloride: 99 mEq/L (ref 96–112)
Creatinine, Ser: 1.01 mg/dL (ref 0.50–1.10)
GFR, EST AFRICAN AMERICAN: 71 mL/min — AB (ref >90)
GFR, EST NON AFRICAN AMERICAN: 61 mL/min — AB (ref >90)
Glucose, Bld: 432 mg/dL — ABNORMAL HIGH (ref 70–99)
Potassium: 3.8 mEq/L (ref 3.7–5.3)
Sodium: 135 mEq/L — ABNORMAL LOW (ref 137–147)

## 2013-12-31 LAB — GLUCOSE, CAPILLARY
GLUCOSE-CAPILLARY: 343 mg/dL — AB (ref 70–99)
GLUCOSE-CAPILLARY: 298 mg/dL — AB (ref 70–99)
Glucose-Capillary: 336 mg/dL — ABNORMAL HIGH (ref 70–99)
Glucose-Capillary: 217 mg/dL — ABNORMAL HIGH (ref 70–99)

## 2013-12-31 LAB — HIV ANTIBODY (ROUTINE TESTING W REFLEX): HIV 1&2 Ab, 4th Generation: NONREACTIVE

## 2013-12-31 MED ORDER — ATORVASTATIN CALCIUM 80 MG PO TABS
80.0000 mg | ORAL_TABLET | Freq: Every morning | ORAL | Status: DC
  Administered 2013-12-31 – 2014-01-03 (×4): 80 mg via ORAL
  Filled 2013-12-31 (×4): qty 1

## 2013-12-31 MED ORDER — INSULIN ASPART 100 UNIT/ML ~~LOC~~ SOLN
0.0000 [IU] | Freq: Every day | SUBCUTANEOUS | Status: DC
  Administered 2013-12-31: 2 [IU] via SUBCUTANEOUS
  Administered 2014-01-01 – 2014-01-02 (×2): 3 [IU] via SUBCUTANEOUS

## 2013-12-31 MED ORDER — ALLOPURINOL 300 MG PO TABS
300.0000 mg | ORAL_TABLET | Freq: Every day | ORAL | Status: DC
  Administered 2013-12-31 – 2014-01-03 (×4): 300 mg via ORAL
  Filled 2013-12-31 (×4): qty 1

## 2013-12-31 MED ORDER — PANTOPRAZOLE SODIUM 40 MG PO TBEC
40.0000 mg | DELAYED_RELEASE_TABLET | Freq: Every day | ORAL | Status: DC
  Administered 2013-12-31 – 2014-01-03 (×4): 40 mg via ORAL
  Filled 2013-12-31 (×4): qty 1

## 2013-12-31 MED ORDER — TRAMADOL HCL 50 MG PO TABS
50.0000 mg | ORAL_TABLET | Freq: Four times a day (QID) | ORAL | Status: DC | PRN
  Administered 2013-12-31 – 2014-01-02 (×6): 50 mg via ORAL
  Filled 2013-12-31 (×7): qty 1

## 2013-12-31 MED ORDER — GEMFIBROZIL 600 MG PO TABS
600.0000 mg | ORAL_TABLET | Freq: Every day | ORAL | Status: DC
  Administered 2013-12-31 – 2014-01-03 (×4): 600 mg via ORAL
  Filled 2013-12-31 (×4): qty 1

## 2013-12-31 MED ORDER — LATANOPROST 0.005 % OP SOLN
1.0000 [drp] | Freq: Every morning | OPHTHALMIC | Status: DC
  Administered 2013-12-31 – 2014-01-03 (×4): 1 [drp] via OPHTHALMIC
  Filled 2013-12-31 (×3): qty 2.5

## 2013-12-31 MED ORDER — DIVALPROEX SODIUM 500 MG PO DR TAB
1000.0000 mg | DELAYED_RELEASE_TABLET | Freq: Every day | ORAL | Status: DC
  Administered 2013-12-31 – 2014-01-02 (×3): 1000 mg via ORAL
  Filled 2013-12-31 (×4): qty 2

## 2013-12-31 MED ORDER — TRAMADOL HCL 50 MG PO TABS
50.0000 mg | ORAL_TABLET | Freq: Once | ORAL | Status: AC
  Administered 2013-12-31: 50 mg via ORAL
  Filled 2013-12-31: qty 1

## 2013-12-31 MED ORDER — INSULIN ASPART 100 UNIT/ML ~~LOC~~ SOLN
0.0000 [IU] | Freq: Three times a day (TID) | SUBCUTANEOUS | Status: DC
  Administered 2013-12-31: 11 [IU] via SUBCUTANEOUS
  Administered 2014-01-01: 7 [IU] via SUBCUTANEOUS
  Administered 2014-01-01: 4 [IU] via SUBCUTANEOUS
  Administered 2014-01-02: 3 [IU] via SUBCUTANEOUS
  Administered 2014-01-02: 7 [IU] via SUBCUTANEOUS
  Administered 2014-01-02: 11 [IU] via SUBCUTANEOUS
  Administered 2014-01-03: 4 [IU] via SUBCUTANEOUS

## 2013-12-31 MED ORDER — INSULIN GLARGINE 100 UNIT/ML ~~LOC~~ SOLN
36.0000 [IU] | Freq: Every day | SUBCUTANEOUS | Status: DC
  Administered 2013-12-31: 36 [IU] via SUBCUTANEOUS
  Filled 2013-12-31 (×2): qty 0.36

## 2013-12-31 MED ORDER — INSULIN ASPART 100 UNIT/ML ~~LOC~~ SOLN: 0.0000 [IU] | Freq: Three times a day (TID) | SUBCUTANEOUS | Status: DC

## 2013-12-31 MED ORDER — SERTRALINE HCL 100 MG PO TABS
100.0000 mg | ORAL_TABLET | Freq: Every day | ORAL | Status: DC
  Administered 2013-12-31 – 2014-01-03 (×4): 100 mg via ORAL
  Filled 2013-12-31 (×4): qty 1

## 2013-12-31 MED ORDER — ARIPIPRAZOLE 5 MG PO TABS
5.0000 mg | ORAL_TABLET | Freq: Every morning | ORAL | Status: DC
  Administered 2013-12-31 – 2014-01-03 (×4): 5 mg via ORAL
  Filled 2013-12-31 (×4): qty 1

## 2013-12-31 MED ORDER — GABAPENTIN 300 MG PO CAPS
300.0000 mg | ORAL_CAPSULE | Freq: Four times a day (QID) | ORAL | Status: DC
  Administered 2013-12-31 – 2014-01-03 (×12): 300 mg via ORAL
  Filled 2013-12-31 (×16): qty 1

## 2013-12-31 MED ORDER — ASPIRIN EC 81 MG PO TBEC
81.0000 mg | DELAYED_RELEASE_TABLET | Freq: Every day | ORAL | Status: DC
  Administered 2013-12-31 – 2014-01-03 (×4): 81 mg via ORAL
  Filled 2013-12-31 (×4): qty 1

## 2013-12-31 MED ORDER — SODIUM CHLORIDE 0.9 % IV SOLN
INTRAVENOUS | Status: AC
  Administered 2014-01-01: 03:00:00 via INTRAVENOUS

## 2013-12-31 MED ORDER — ALPRAZOLAM 0.5 MG PO TABS
0.5000 mg | ORAL_TABLET | Freq: Three times a day (TID) | ORAL | Status: DC
  Administered 2013-12-31 – 2014-01-03 (×9): 0.5 mg via ORAL
  Filled 2013-12-31 (×9): qty 1

## 2013-12-31 MED ORDER — TRAZODONE HCL 50 MG PO TABS
50.0000 mg | ORAL_TABLET | Freq: Every day | ORAL | Status: DC
  Administered 2013-12-31 – 2014-01-01 (×2): 50 mg via ORAL
  Filled 2013-12-31 (×4): qty 1

## 2013-12-31 MED ORDER — LEVOTHYROXINE SODIUM 100 MCG PO TABS
100.0000 ug | ORAL_TABLET | Freq: Every day | ORAL | Status: DC
  Administered 2014-01-01 – 2014-01-03 (×3): 100 ug via ORAL
  Filled 2013-12-31 (×4): qty 1

## 2013-12-31 MED ORDER — LIVING WELL WITH DIABETES BOOK
Freq: Once | Status: AC
  Administered 2013-12-31: 12:00:00
  Filled 2013-12-31 (×2): qty 1

## 2013-12-31 NOTE — Progress Notes (Signed)
Subjective: She feels better today. Blurred vision and dizziness have significantly improved. She reports good compliance with her insulin regimen at home. She requests for her pain medications, including Tramadol and Gabapentin. No chest pain today. No sob.  She has a diet and she had more than half of her breakfast.   Objective: Vital signs in last 24 hours: Filed Vitals:   12/31/13 0539 12/31/13 0749 12/31/13 1000 12/31/13 1421  BP: 129/84  124/82 108/70  Pulse: 56  63 72  Temp: 97.5 F (36.4 C)  97.5 F (36.4 C) 98.1 F (36.7 C)  TempSrc: Oral  Oral Oral  Resp: 18  20 20   Height:      Weight:      SpO2: 94% 97% 96% 92%   Weight change:   Intake/Output Summary (Last 24 hours) at 12/31/13 1428 Last data filed at 12/31/13 1423  Gross per 24 hour  Intake    578 ml  Output   1250 ml  Net   -672 ml   General: Vital signs reviewed.  Lungs: Clear bilaterally Heart: Regular rate and rhythm; no extra sounds or murmurs  Abdomen: Bowel sounds present, soft, nontender; no hepatosplenomegaly  Extremities: No bilateral ankle edema  Neurologic: Alert and oriented x3. Moves all extremities.   Lab Results: Basic Metabolic Panel:  Recent Labs Lab 12/30/13 2246 12/31/13 0033  NA 134* 135*  K 3.8 3.8  CL 99 99  CO2 21 21  GLUCOSE 285* 432*  BUN 16 15  CREATININE 1.04 1.01  CALCIUM 8.6 8.0*  MG 1.9  --   PHOS 2.8  --    Liver Function Tests:  Recent Labs Lab 12/30/13 1521  AST 16  ALT 20  ALKPHOS 138*  BILITOT 0.3  PROT 7.0  ALBUMIN 3.5   CBC:  Recent Labs Lab 12/30/13 2058  WBC 8.0  HGB 13.1  HCT 39.3  MCV 87.9  PLT 296   CBG:  Recent Labs Lab 12/30/13 2014 12/30/13 2124 12/30/13 2206 12/30/13 2311 12/31/13 0747 12/31/13 1125  GLUCAP 333* 283* 287* 303* 343* 336*    Recent Labs Lab 12/30/13 2058  TSH 67.190*   Urine Drug Screen: Drugs of Abuse     Component Value Date/Time   LABOPIA NONE DETECTED 12/30/2013 2058   LABOPIA NEG 01/23/2013  1507   COCAINSCRNUR POSITIVE* 12/30/2013 2058   COCAINSCRNUR NEG 01/23/2013 1507   LABBENZ NONE DETECTED 12/30/2013 2058   LABBENZ NEG 01/23/2013 1507   AMPHETMU NONE DETECTED 12/30/2013 2058   THCU NONE DETECTED 12/30/2013 2058   LABBARB NONE DETECTED 12/30/2013 2058   LABBARB NEG 01/23/2013 1507    Urinalysis:  Recent Labs Lab 12/30/13 2058  COLORURINE YELLOW  LABSPEC 1.031*  PHURINE 5.0  GLUCOSEU >1000*  HGBUR NEGATIVE  BILIRUBINUR NEGATIVE  KETONESUR NEGATIVE  PROTEINUR NEGATIVE  UROBILINOGEN 0.2  NITRITE NEGATIVE  LEUKOCYTESUR TRACE*   Micro Results: No results found for this or any previous visit (from the past 240 hour(s)). Studies/Results: No results found. Medications: I have reviewed the patient's current medications. Scheduled Meds: . allopurinol  300 mg Oral Daily  . ALPRAZolam  0.5 mg Oral TID  . ARIPiprazole  5 mg Oral q morning - 10a  . aspirin EC  81 mg Oral Daily  . atorvastatin  80 mg Oral q morning - 10a  . divalproex  1,000 mg Oral QHS  . gabapentin  300 mg Oral QID  . gemfibrozil  600 mg Oral Daily  . heparin  5,000 Units  Subcutaneous 3 times per day  . insulin aspart  0-20 Units Subcutaneous TID WC  . insulin aspart  0-5 Units Subcutaneous QHS  . insulin glargine  36 Units Subcutaneous QHS  . latanoprost  1 drop Both Eyes q morning - 10a  . [START ON 01/01/2014] levothyroxine  100 mcg Oral QAC breakfast  . mometasone-formoterol  2 puff Inhalation BID  . pantoprazole  40 mg Oral Daily  . sertraline  100 mg Oral Daily  . timolol  1 drop Both Eyes TID  . traZODone  50 mg Oral QHS   Continuous Infusions: . sodium chloride     PRN Meds:.albuterol, dextrose, nitroGLYCERIN, ondansetron (ZOFRAN) IV, traMADol Assessment/Plan: 58 year old female with hx of uncontrolled DM2, substance abuse (with recent cocaine), HTN, and depression presented with to opc from home with persistently high blood sugars with hyperglycemic symptoms. Her meter was reading "HIGH" all  day. She was found to have +cocaine on UDS.  HSS: Improving. She has uncontrolled DM with a1c >14%. Also likely exacerbated in setting of substance abuse with cocaine. CBGs >600 admission. She was restarted on Lantus and SSI. She had a diet today.  Plan  - cont with IVF over night at 100cc/hr of NS for 20 hrs  - cont with carb modified diet  - increased lantus to 36 units at bed time. She received 20 units last night  - increased sliding scale from moderate to resistant  - add qhs coverage  - monitor and replace electrolytes. AG is 15 today with CO2 of 21.  - consulted diabetic educator who made some recommendations on insulin therapy.   Acute on chronic CKD 2: Resolved. On admission her Cr up to 1.35 likely pre-renal in setting of hypovolemia with hyperglycemia.  -trend renal function   Substance abuse--last use of cocaine over the weekend. Strongly encouraged cessation. Consult Education officer, museum for drug abuse cessation. She denies chest pain at this time. EKG from this am without ischemic changes.  Plan - trend trops X3 - keep in tele - do ekg if she has chest pain   Hypothyroidism: Last TSH 93 10/2013 and synthroid dose was increased to 63mcg. TSH still very high 67.190. Plan  - increased Synthroid to 168mcg  - follow up TSH as outpatient.   Chronic back pain: restart home medication including Tramadol and Gabapentin.   HTN--normotensive on admission. Home medications include lisinopril 40mg  and norvasc 5mg . We can restarted if BP is high.    Diet: change npo to carb modified.   Dvt Ppx: Heparin   Dispo: Disposition is deferred at this time, awaiting improvement of current medical problems.  Anticipated discharge in approximately tomorrow once symptoms are better and insulin therapy for outpatient is clarified.   The patient does have a current PCP Posey Pronto, [place], MD), therefore is requiring OPC follow-up after discharge.   The patient does have transportation limitations that  hinder transportation to clinic appointments.  .Services Needed at time of discharge: Y = Yes, Blank = No PT:   OT:   RN:   Equipment:   Other:     LOS: 1 day   Jessee Avers PGY 3 - Internal Medicine Teaching Service Pager: [phone] 12/31/2013, 2:28 PM

## 2013-12-31 NOTE — Progress Notes (Signed)
Internal Medicine Attending Admission Note Date: 12/31/2013  Patient name: [name] Medical record number: [id number] Date of birth: Sep 17, 1955 Age: 58 y.o. Gender: female  I saw and evaluated the patient. I reviewed the resident's note and I agree with the resident's findings and plan as documented in the resident's note, with the following additional comments.  Chief Complaint(s): High blood sugar; fatigability  History - key components related to admission: Patient is a 58 year old woman with history of upper which controlled type 2 diabetes mellitus, hypertension, depression, substance abuse, and other problems as outlined in medical history admitted with persistent hyperglycemia noted on home blood glucose meter, with associated polyuria and polydipsia.  She reports taking her insulin regularly for the past week, but has apparently been taking a lower dose than prescribed of Lantus and mealtime NovoLog.  Review of systems is notable for recent indigestion with epigastric discomfort.   Physical Exam - key components related to admission:  Filed Vitals:   12/30/13 2209 12/31/13 0217 12/31/13 0539 12/31/13 0749  BP: 122/81 102/63 129/84   Pulse: 55 66 56   Temp: 97.8 F (36.6 C) 97.5 F (36.4 C) 97.5 F (36.4 C)   TempSrc: Oral Oral Oral   Resp: 18 18 18    Height:      Weight:      SpO2: 96% 94% 94% 97%    General: Alert, oriented, no distress Lungs: Clear Heart: Regular; S1-S2, no S3, no S4, no murmurs Abdomen: Bowel sounds present, soft; mild epigastric tenderness; no hepatosplenomegaly Extremities: No edema   Lab results:   Basic Metabolic Panel:  Recent Labs  12/30/13 2246 12/31/13 0033  NA 134* 135*  K 3.8 3.8  CL 99 99  CO2 21 21  GLUCOSE 285* 432*  BUN 16 15  CREATININE 1.04 1.01  CALCIUM 8.6 8.0*  MG 1.9  --   PHOS 2.8  --     Liver Function Tests:  Recent Labs  12/30/13 1521  AST 16  ALT 20  ALKPHOS 138*  BILITOT 0.3  PROT 7.0  ALBUMIN  3.5     CBC:  Recent Labs  12/30/13 2058  WBC 8.0  HGB 13.1  HCT 39.3  MCV 87.9  PLT 296    CBG:  Recent Labs  12/30/13 1817 12/30/13 2014 12/30/13 2124 12/30/13 2206 12/30/13 2311 12/31/13 0747  GLUCAP 447* 333* 283* 287* 303* 343*     Thyroid Function Tests:  Recent Labs  12/30/13 2058  TSH 67.190*    Lab Results  Component Value Date   TSH 67.190* 12/30/2013   TSH 93.150* 10/27/2013   TSH 77.320* 09/03/2013   TSH 78.029* 08/17/2013     Urine Drug Screen: Drugs of Abuse     Component Value Date/Time   LABOPIA NONE DETECTED 12/30/2013 2058   COCAINSCRNUR POSITIVE* 12/30/2013 2058   LABBENZ NONE DETECTED 12/30/2013 2058   AMPHETMU NONE DETECTED 12/30/2013 2058   THCU NONE DETECTED 12/30/2013 2058   LABBARB NONE DETECTED 12/30/2013 2058      Urinalysis    Component Value Date/Time   COLORURINE YELLOW 12/30/2013 2058   APPEARANCEUR CLEAR 12/30/2013 2058   LABSPEC 1.031* 12/30/2013 2058   PHURINE 5.0 12/30/2013 2058   GLUCOSEU >1000* 12/30/2013 2058   HGBUR NEGATIVE 12/30/2013 2058   BILIRUBINUR NEGATIVE 12/30/2013 2058   [place] NEGATIVE 12/30/2013 2058   PROTEINUR NEGATIVE 12/30/2013 2058   UROBILINOGEN 0.2 12/30/2013 2058   NITRITE NEGATIVE 12/30/2013 2058   LEUKOCYTESUR TRACE* 12/30/2013 2058  Urine microscopic:  Recent Labs  12/30/13 2058  EPIU MANY*  WBCU 0-2  RBCU 0-2  BACTERIA RARE  OTHERU FEW YEAST     Other results: EKG: Normal sinus rhythm with sinus arrhythmia; normal ECG    Assessment & Plan by Problem:  1.  Diabetic ketoacidosis.  There is no apparent precipitating illness; this may be related to nonadherence to her insulin regimen.  Given the improvement in the anion gap and clinical status following initial volume replacement and insulin, plan is to adjust subcutaneous insulin regimen and continue normal saline volume replacement; follow electrolyte panel and renal function; follow CBGs; if needed, can consider insulin drip.  2. Volume  depletion.  Plan is IV normal saline volume replacement; follow in/outs, electrolytes, and renal function.  3. Hypothyroidism.  Patient's TSH is markedly elevated, but improved since early June on levothyroxine 75 mcg daily; would increase to 100 mcg daily and recheck thyroid panel in 6 weeks.  4.  Substance abuse.  Urine drug screen is positive for cocaine, which may be a contributing factor.  Plan is counsel regarding absence/treatment and provide information regarding resources in the community.  5.  Other problems and plans as per the resident physician's note.

## 2013-12-31 NOTE — Progress Notes (Signed)
1st call notified of Troponin's not been drawn today.

## 2013-12-31 NOTE — Progress Notes (Signed)
  RD consulted for nutrition education regarding diabetes.   Lab Results  Component Value Date   HGBA1C 14.8* 12/07/2013    Patient reports receiving diet education in the past and denies any needs at this time. Per chart review patient also sees an outpatient dietitian. Pt agreeable to sharing diet recall and allowing RD to make recommendations.  Breakfast: [place] sausage, eggs, toast, grits or oatmeal Lunch: Sandwich Dinner: baked chicken, corn, macaroni and cheese Snacks; half an orange Beverages: Diet and regular soda, water, diet juice, orangeade  Discussed appropriate portions sizes of carbohydrate containing foods. Pt states she has measuring cups at home. Provided tips for balancing meals. Encouraged pt to incorporate a low carb vegetable with meals. Stressed the importance of eliminating regular soda and orangeade from her diet; encouraged her to check nutrition labels on diet soda for carbohydrate content.  RD provided "1800-Calorie 5-Day Sample Menus" from the Academy of [employer] Dietetics.   Teach back method used.  Expect fair compliance.  Body mass index is 42.59 kg/(m^2). Pt meets criteria for Morbid Obesity based on current BMI.  Current diet order is Heart Healthy/Carb Modified, patient is consuming approximately 85% of meals at this time. Labs and medications reviewed. No further nutrition interventions warranted at this time. RD contact information provided. If additional nutrition issues arise, please re-consult RD.  Pryor Ochoa RD, LDN Inpatient Clinical Dietitian Pager: [phone] After Hours Pager: [phone]

## 2013-12-31 NOTE — Progress Notes (Signed)
Inpatient Diabetes Program Recommendations  AACE/ADA: New Consensus Statement on Inpatient Glycemic Control (2013)  Target Ranges:  Prepandial:   less than 140 mg/dL      Peak postprandial:   less than 180 mg/dL (1-2 hours)      Critically ill patients:  140 - 180 mg/dL  Results for LYNNLEY, DODDRIDGE (MRN [id number]) as of 12/31/2013 13:50  Ref. Range 12/30/2013 15:06 12/30/2013 18:17 12/30/2013 20:14 12/30/2013 21:24 12/30/2013 22:06 12/30/2013 23:11 12/31/2013 07:47 12/31/2013 11:25  Glucose-Capillary Latest Range: 70-99 mg/dL >600 (HH) 447 (H) 333 (H) 283 (H) 287 (H) 303 (H) 343 (H) 336 (H)   Inpatient Diabetes Program Recommendations Insulin - Basal: Increase Lantus to home dose 36 units  Insulin - Meal Coverage: consider adding Novolog 8 units TID with meals per Glycemic Control order-set Thank you  Raoul Pitch BSN, RN,CDE Inpatient Diabetes Coordinator [phone] (team pager)

## 2013-12-31 NOTE — Progress Notes (Signed)
Subjective: Patient feels much more alert today. She reports significant back and pelvic pain and would like something for pain relief. She continues to endorse blurry vision that has improved but not resolved. Continues to endorse compliance with insulin regimen. Has re-started eating with heart healthy diet. Denies fatigue and polydipsia.  Objective: Vital signs in last 24 hours: Filed Vitals:   12/31/13 0217 12/31/13 0539 12/31/13 0749 12/31/13 1000  BP: 102/63 129/84  124/82  Pulse: 66 56  63  Temp: 97.5 F (36.4 C) 97.5 F (36.4 C)  97.5 F (36.4 C)  TempSrc: Oral Oral  Oral  Resp: 18 18  20   Height:      Weight:      SpO2: 94% 94% 97% 96%   Weight change:   Intake/Output Summary (Last 24 hours) at 12/31/13 1110 Last data filed at 12/31/13 0909  Gross per 24 hour  Intake    356 ml  Output    950 ml  Net   -594 ml   BP 124/82  Pulse 63  Temp(Src) 97.5 F (36.4 C) (Oral)  Resp 20  Ht 5\' 2"  (1.575 m)  Wt 105.643 kg (232 lb 14.4 oz)  BMI 42.59 kg/m2  SpO2 96% General appearance: alert, cooperative and no distress Lungs: clear to auscultation bilaterally Heart: regular rate and rhythm, S1, S2 normal, no murmur, click, rub or gallop Abdomen: soft, non-tender; bowel sounds normal; no masses,  no organomegaly Extremities: extremities normal, atraumatic, no cyanosis or edema Neurologic: AOx3  Lab Results: Basic Metabolic Panel:  Recent Labs Lab 12/30/13 2246 12/31/13 0033  NA 134* 135*  K 3.8 3.8  CL 99 99  CO2 21 21  GLUCOSE 285* 432*  BUN 16 15  CREATININE 1.04 1.01  CALCIUM 8.6 8.0*  MG 1.9  --   PHOS 2.8  --    Liver Function Tests:  Recent Labs Lab 12/30/13 1521  AST 16  ALT 20  ALKPHOS 138*  BILITOT 0.3  PROT 7.0  ALBUMIN 3.5   No results found for this basename: LIPASE, AMYLASE,  in the last 168 hours No results found for this basename: AMMONIA,  in the last 168 hours CBC:  Recent Labs Lab 12/30/13 2058  WBC 8.0  HGB 13.1  HCT  39.3  MCV 87.9  PLT 296   Cardiac Enzymes: No results found for this basename: CKTOTAL, CKMB, CKMBINDEX, TROPONINI,  in the last 168 hours BNP: No results found for this basename: PROBNP,  in the last 168 hours D-Dimer: No results found for this basename: DDIMER,  in the last 168 hours CBG:  Recent Labs Lab 12/30/13 1817 12/30/13 2014 12/30/13 2124 12/30/13 2206 12/30/13 2311 12/31/13 0747  GLUCAP 447* 333* 283* 287* 303* 343*   Hemoglobin A1C: No results found for this basename: HGBA1C,  in the last 168 hours Fasting Lipid Panel: No results found for this basename: CHOL, HDL, LDLCALC, TRIG, CHOLHDL, LDLDIRECT,  in the last 168 hours Thyroid Function Tests:  Recent Labs Lab 12/30/13 2058  TSH 67.190*   Coagulation: No results found for this basename: LABPROT, INR,  in the last 168 hours Anemia Panel: No results found for this basename: VITAMINB12, FOLATE, FERRITIN, TIBC, IRON, RETICCTPCT,  in the last 168 hours Urine Drug Screen: Drugs of Abuse     Component Value Date/Time   LABOPIA NONE DETECTED 12/30/2013 2058   LABOPIA NEG 01/23/2013 1507   COCAINSCRNUR POSITIVE* 12/30/2013 2058   COCAINSCRNUR NEG 01/23/2013 1507   LABBENZ NONE DETECTED  12/30/2013 2058   LABBENZ NEG 01/23/2013 1507   AMPHETMU NONE DETECTED 12/30/2013 2058   THCU NONE DETECTED 12/30/2013 2058   LABBARB NONE DETECTED 12/30/2013 2058   LABBARB NEG 01/23/2013 1507    Alcohol Level: No results found for this basename: ETH,  in the last 168 hours Urinalysis:  Recent Labs Lab 12/30/13 2058  COLORURINE YELLOW  LABSPEC 1.031*  PHURINE 5.0  GLUCOSEU >1000*  HGBUR NEGATIVE  BILIRUBINUR NEGATIVE  KETONESUR NEGATIVE  PROTEINUR NEGATIVE  UROBILINOGEN 0.2  NITRITE NEGATIVE  LEUKOCYTESUR TRACE*   Studies/Results: No results found.  Medications:  Scheduled Meds: . allopurinol  300 mg Oral Daily  . ALPRAZolam  0.5 mg Oral TID  . ARIPiprazole  5 mg Oral q morning - 10a  . aspirin EC  81 mg Oral Daily    . atorvastatin  80 mg Oral q morning - 10a  . divalproex  1,000 mg Oral QHS  . gabapentin  300 mg Oral QID  . gemfibrozil  600 mg Oral Daily  . heparin  5,000 Units Subcutaneous 3 times per day  . insulin aspart  0-15 Units Subcutaneous TID WC  . latanoprost  1 drop Both Eyes q morning - 10a  . [START ON 01/01/2014] levothyroxine  100 mcg Oral QAC breakfast  . living well with diabetes book   Does not apply Once  . mometasone-formoterol  2 puff Inhalation BID  . pantoprazole  40 mg Oral Daily  . sertraline  100 mg Oral Daily  . timolol  1 drop Both Eyes TID  . traZODone  50 mg Oral QHS   Continuous Infusions: . sodium chloride 75 mL/hr at 12/30/13 2224   PRN Meds:.albuterol, dextrose, nitroGLYCERIN, ondansetron (ZOFRAN) IV, traMADol  Assessment/Plan: Principal Problem:   DKA, type 2, not at goal Active Problems:   Hypothyroidism   Chronic back pain   Cocaine abuse   AKI (acute kidney injury)  HSS in setting of uncontrolled DM2 and incorrect diet and insulin regimen--also likely exacerbated in setting of substance abuse with cocaine. CBGs 200s-400s today despite lantus 20 units at midnight and SSI with meals. AG 15 today, but symptomatically improved from yesterday. Denies fatigue and polydipsia. Corrected Na 135. Last similar hospital admissions last month for HHS. Latest insulin regimen was recommended to be Lantus 36 units qhs and novolog 8 units TID.  -Continue NS at 100 mL/hr IV -Lantus 36 U and resistant SSI tonight -Diabetic counselor consulted, appreciate recs -Consider correction factor of 1800: <180 mg/dL - 6U; 180-215 mg/dL - 7U; 216-250 mg/dL - 8U; 251-285 mg/dL - 9U; 286-320 mg/dL - 10U; 321-355 mg/dL - 11U; 355-390 mg/dL - 12U; 391-425 mg/dL - 13U; 426-460 mg/dL - 14U; 461-495 mg/dL - 15U; 496-530 mg/dL - 16U; 531-565 mg/dL - 17U; 566-600 mg/dL - 18U -HH diet -replete electrolytes as needed -Zofran prn  -Restarted home meds, except for lisinopril given low BP and  increase in SCr with h/o CKD2  Acute on chronic CKD 2: Cr down-trended to 1.01 today from 1.35 (8/5), likely pre-renal in setting of hypovolemia with DKA.  -Continue NS at 100 mL/hr IV -Trend renal function   Substance abuse--last use of cocaine over the weekend. UDS positive for cocaine. Strongly encouraged cessation. -Consider SW consult  Hypothyroidism--TSH 67 yesterday, down from 93 on 10/2013 when synthroid dose was increased to 50mcg. No s/s of hypothyroidism. -Increased Synthroid to 100 mcg   Chronic back pain: endorses significant back and pelvic pain -Continue home tramadol  -Continue home gabapentin  HTN: normotensive since admission. Home medications include lisinopril 40mg  and norvasc 5mg .  -holding for now given lower bp and AKI, will gradually resume given further clinical improvement   Diet: HH   Dvt Ppx: Heparin   Dispo: Disposition is deferred at this time, awaiting improvement of current medical problems. Anticipated discharge in approximately 1-2 day(s).   This is a Careers information officer Note.  The care of the patient was discussed with Dr. Alice Rieger and the assessment and plan formulated with their assistance.  Please see their attached note for official documentation of the daily encounter.   LOS: 1 day   Ulice Brilliant, Med Student 12/31/2013, 11:10 AM   I have seen the patient and reviewed the daily progress note by Lenise Arena IV and discussed the care of the patient with them.  Please see note for my findings, assessment, and plans/additions.   Signed:  Jessee Avers, MD PGY-2 Internal Medicine Teaching Service Pager: [phone]

## 2013-12-31 NOTE — Progress Notes (Signed)
[facility]  Patient Status: Active (receiving services up to time of hospitalization)  [facility] is providing the following services: RN and PT  If patient discharges after hours, please call [phone].   Consepcion Hearing 12/31/2013, 2:43 PM

## 2013-12-31 NOTE — Progress Notes (Signed)
Utilization review completed.  

## 2013-12-31 NOTE — Discharge Summary (Addendum)
Name: [name] MRN: [id number] DOB: [birth year] 58 y.o. PCP: Charlott Rakes, MD  Date of Admission: 12/30/2013  6:08 PM Date of Discharge: 01/03/2014 Attending Physician: Dr Carlyle Basques Discharge Diagnosis: Principal Problem:   DKA, type 2, not at goal Active Problems:   Hypothyroidism   Chronic back pain   Cocaine abuse   AKI (acute kidney injury)  Discharge Medications:   Medication List         albuterol 108 (90 BASE) MCG/ACT inhaler  Commonly known as:  PROVENTIL HFA;VENTOLIN HFA  Inhale 2 puffs into the lungs every 6 (six) hours as needed for wheezing.     albuterol (2.5 MG/3ML) 0.083% nebulizer solution  Commonly known as:  PROVENTIL  Inhale 2.5 mg into the lungs every 6 (six) hours as needed for wheezing.     allopurinol 300 MG tablet  Commonly known as:  ZYLOPRIM  Take 1 tablet (300 mg total) by mouth daily. For gout.     ALPRAZolam 0.5 MG tablet  Commonly known as:  XANAX  Take 0.5 mg by mouth 3 (three) times daily.     amLODipine 5 MG tablet  Commonly known as:  NORVASC  Take 1 tablet (5 mg total) by mouth daily. For hypertension.     ARIPiprazole 5 MG tablet  Commonly known as:  ABILIFY  Take 5 mg by mouth every morning.     aspirin EC 81 MG tablet  Take 81 mg by mouth daily.     atorvastatin 80 MG tablet  Commonly known as:  LIPITOR  Take 80 mg by mouth every morning.     BUTRANS 10 MCG/HR Ptwk patch  Generic drug:  buprenorphine  Place 1 patch onto the skin every Monday.     colchicine 0.6 MG tablet  Take 1 tablet (0.6 mg total) by mouth daily as needed (for gout flare-ups).     divalproex 500 MG DR tablet  Commonly known as:  DEPAKOTE  Take 1,000 mg by mouth at bedtime.     gabapentin 300 MG capsule  Commonly known as:  NEURONTIN  300 mg 4 (four) times daily.     gemfibrozil 600 MG tablet  Commonly known as:  LOPID  Take 600 mg by mouth daily.     glucose blood test strip  Use as instructed     insulin aspart 100 UNIT/ML  FlexPen  Commonly known as:  NOVOLOG  - For blood sugar 151 - 200 take 4 units   - For blood sugar 201 - 250 take 7 units   - For blood sugar 251 - 300 take 11 units   - For blood sugar 301 - 350 take 15 units   - If blood sugar is above 350, please call clinic for instructions.     insulin glargine 100 unit/mL Sopn  Commonly known as:  LANTUS  Inject 0.52 mLs (52 Units total) into the skin at bedtime.     Insulin Pen Needle 31G X 5 MM Misc  Use as directed.     latanoprost 0.005 % ophthalmic solution  Commonly known as:  XALATAN  Place 1 drop into both eyes every morning.     levothyroxine 75 MCG tablet  Commonly known as:  SYNTHROID, LEVOTHROID  Take 1 tablet (75 mcg total) by mouth daily before breakfast.     lisinopril 40 MG tablet  Commonly known as:  PRINIVIL,ZESTRIL  Take 1 tablet (40 mg total) by mouth daily.     mometasone-formoterol 100-5 MCG/ACT  Aero  Commonly known as:  DULERA  Inhale 2 puffs into the lungs 2 (two) times daily.     nitroGLYCERIN 0.4 MG SL tablet  Commonly known as:  NITROSTAT  Place 1 tablet (0.4 mg total) under the tongue every 5 (five) minutes as needed for chest pain.     ondansetron 8 MG disintegrating tablet  Commonly known as:  ZOFRAN ODT  Take 1 tablet (8 mg total) by mouth every 8 (eight) hours as needed for nausea or vomiting.     pantoprazole 40 MG tablet  Commonly known as:  PROTONIX  Take 1 tablet (40 mg total) by mouth daily. For reflux.     sertraline 100 MG tablet  Commonly known as:  ZOLOFT  Take 1 tablet (100 mg total) by mouth daily.     timolol 0.5 % ophthalmic solution  Commonly known as:  BETIMOL  Place 1 drop into both eyes 3 (three) times daily.     traMADol 50 MG tablet  Commonly known as:  ULTRAM  Take 50 mg by mouth every 6 (six) hours as needed for moderate pain.     traZODone 50 MG tablet  Commonly known as:  DESYREL  Take 50 mg by mouth at bedtime.        Disposition and follow-up:   Maria  T Mathis was discharged from [facility] in Good condition.  At the hospital follow up visit please address:  1. Please evaluate patient's glycemic control and make changes to her insulin regimen as needed. 2. Evaluate for urinary symptoms and do urinalysis if she is symptomatic 3. Address symptoms of epigastric/chest pain. Cardiac etiology was excluded during hospitalization 4. 3.  Pending labs/ test needing follow-up: none  Follow-up Appointments: Follow-up Information   Follow up with Albin Felling, MD On 01/08/2014. (at 2:30pm )    Specialty:  Internal Medicine   Contact information:   [place] [id number] [phone]       Discharge Instructions:   Thank you for allowing [place] to be involved in your healthcare while you were hospitalized at [facility].   Please note that there have been changes to your home medications.  --> PLEASE LOOK AT YOUR DISCHARGE MEDICATION LIST FOR DETAILS.  For you premeal insulin, please follow the new sliding scale instructions as below:  For blood sugar 151 - 200 take 4 units  For blood sugar 201 - 250 take 7 units  For blood sugar 251 - 300 take 11 units  For blood sugar 301 - 350 take 15 units  If blood sugar is above 350, please call clinic for instructions.  Please check you blood sugar 3-4 times a day and bring your meter to the next appointment in the clinic to see if we can make changes to your insulin regimen   Please call your PCP if you have any questions or concerns, or any difficulty getting any of your medications.  Please return to the ER if you have worsening of your symptoms or new severe symptoms arise.   Consultations:   none  Procedures Performed:  Dg Chest Port 1 View  12/07/2013   CLINICAL DATA:  Cough  EXAM: PORTABLE CHEST - 1 VIEW  COMPARISON:  Oct 01, 2013  FINDINGS: Lungs are clear. Heart size and pulmonary vascularity are normal. No adenopathy. No bone lesions.   IMPRESSION: No edema or consolidation.   Electronically Signed   By: Lowella Grip M.D.  On: 12/07/2013 01:09   Admission HPI: [name] is a 58 year old female with PMH of poorly controlled insulin-dependent T2DM, HTN, hypothyroidism, depression/anxiety, bipolar disorder, chronic pain, and polysubstance abuse (cocaine and benzodiazepines) who was a direct admit from clinic for elevated blood glucose levels, nausea, and blurry vision. Today, she reports taking 6 units with breakfast and lunch and at 11 am her blood glucose was 569. Her home RN re-checked her blood glucose that read "High", even after the RN gave her 2 more units of novolog to try to equal total 8 units in the afternoon. She reports that she has been taking 6 units of Novolog with meals and 34 units of Lantus last night, which is below her recommended 8 units of Novolog and 36 units of Lantus. Her blood glucose levels have been running between 400-500s since her discharge on 7/24. She last used cocaine one week ago. Endorses nausea, epigastric pain, blurriness of vision, lethargy, slow mentation, dizziness, polyuria, and peripheral neuropathy. Denies vomiting, headaches, and pain with urination. She was recently hospitalized (7/23-7/24 and 7/12-7/13) for HHS.  Hospital Course by problem list: Principal Problem:   DKA, type 2, not at goal Active Problems:   Hypothyroidism   Chronic back pain   Cocaine abuse   AKI (acute kidney injury)   DKA, versus HHS: [name]'s CBG was >600 and AG of 16 upon admission. She has uncontrolled DM with a1c >14%. Also likely exacerbated in setting of substance abuse with cocaine. She was complaining of increased fatigue, and other hyperglycemic symptoms. There was no obvious infectious trigger of her DKA, even though she had a small gluteal wound. Her urinalysis revealed moderate leukocytes, and she was a concern of suprapubic tenderness. She has diffuse abdominal tenderness more so in the epigastric  area. She denied dysuria. She remained afebrile and at discharge her symptoms had largely resolved. No antibiotics were prescribed. She'll need close followup as outpatient if she developed urinary symptoms are urinalysis can be performed and treated. Her BMP revealed a mildly low bicarbonate of 21, which gradually normalized with treatment with IV fluids, and insulin. A subcutaneous Lantus insulin dose was gradually titrated and over the days, her CBG is improved. She required up to 52 units of each bedtime of Lantus before bedtime to achieve a reasonable glycemic control during her hospitalization. She also required resistant sliding scale insulin regimen of Novolog and bedtime coverage.  She did not require an insulin drip. A diabetic educator was consulted to assist in insulin therapy recommendations. She did not suffer any hypoglycemic episodes. She was therefore, discharged on a higher dose of insulin, Lantus 52 units qhs with a sliding scale (detailed above in patient instructions). She was encouraged to check her blood sugar 3-4 times daily, and bring her glucose meter during her clinic visit. Further adjustments of insulin therapy may be required.   Epigastric pain/chest pain: During her last 2 days of hospitalization she complained of pain in the epigastric area, but also involving her chest area. On examination, she was tender in the epigastric area. Her pain was somehow positional and appeared to be in the epigastric area. EKG and Trop were negative. No nausea or vomiting. The pain improved somehow with Phenergan and Simethicone. Patient refuses a GI cocktail, due to vomiting with it before. She was discharged on a PPI. It was presumed that cardiac etiology was unlikely given the patient has significant risk factors, including diabetes, hypertension, and cocaine use. Her epigastric pain, will need to  be evaluated in outpatient setting.  Skin wound: She was found to have a non-draining,  non-erythematous superficial <1cm skin lesion left side of intergluteal clef, less concerning for a soft tissue infection. No evidence of infection on exam. Clinically continue to monitor. This can be reexamined during outpatient followup visit in a few days.  Acute on chronic CKD 2: She had a slight elevation of her creatinine level up to 1.35 on admission. It was likely pre-renal in setting of hypovolemia with hyperglycemia. Her serum creatinine down-trended and was 0.1 at discharge.  Substance abuse: Patient has a history of cocaine use and admitted to using cocaine one week prior to admission. The team strongly encouraged her to stop using cocaine, and social work was consulted for drug abuse cessation. EKG showed no ischemic changes and troponin was negative.   Hypothyroidism: Her TSH down-trended from 93 (10/2013) to 67 (12/30/2013). Her synthroid dose was increased to 100 mcg. She will require TSH measurement in 6-8 weeks.   Discharge Vitals:   BP 135/84  Pulse 57  Temp(Src) 97.8 F (36.6 C) (Oral)  Resp 12  Ht 5\' 2"  (1.575 m)  Wt 232 lb 14.4 oz (105.643 kg)  BMI 42.59 kg/m2  SpO2 94%  Discharge Labs:  Results for orders placed during the hospital encounter of 12/30/13 (from the past 24 hour(s))  GLUCOSE, CAPILLARY     Status: Abnormal   Collection Time    01/02/14 11:56 AM      Result Value Ref Range   Glucose-Capillary 252 (*) 70 - 99 mg/dL  TROPONIN I     Status: None   Collection Time    01/02/14  2:16 PM      Result Value Ref Range   Troponin I <0.30  <0.30 ng/mL  GLUCOSE, CAPILLARY     Status: Abnormal   Collection Time    01/02/14  4:52 PM      Result Value Ref Range   Glucose-Capillary 209 (*) 70 - 99 mg/dL  TROPONIN I     Status: None   Collection Time    01/02/14  6:43 PM      Result Value Ref Range   Troponin I <0.30  <0.30 ng/mL  GLUCOSE, CAPILLARY     Status: Abnormal   Collection Time    01/02/14  7:55 PM      Result Value Ref Range   Glucose-Capillary  215 (*) 70 - 99 mg/dL  GLUCOSE, CAPILLARY     Status: Abnormal   Collection Time    01/02/14  9:02 PM      Result Value Ref Range   Glucose-Capillary 269 (*) 70 - 99 mg/dL   Comment 1 Notify RN     Comment 2 Documented in Chart    TROPONIN I     Status: None   Collection Time    01/03/14 12:41 AM      Result Value Ref Range   Troponin I <0.30  <0.30 ng/mL  BASIC METABOLIC PANEL     Status: Abnormal   Collection Time    01/03/14 12:41 AM      Result Value Ref Range   Sodium 141  137 - 147 mEq/L   Potassium 4.2  3.7 - 5.3 mEq/L   Chloride 102  96 - 112 mEq/L   CO2 27  19 - 32 mEq/L   Glucose, Bld 135 (*) 70 - 99 mg/dL   BUN 13  6 - 23 mg/dL   Creatinine, Ser 1.10  0.50 -  1.10 mg/dL   Calcium 9.1  8.4 - 10.5 mg/dL   GFR calc non Af Amer 55 (*) >90 mL/min   GFR calc Af Amer 64 (*) >90 mL/min   Anion gap 12  5 - 15  GLUCOSE, CAPILLARY     Status: Abnormal   Collection Time    01/03/14  7:48 AM      Result Value Ref Range   Glucose-Capillary 158 (*) 70 - 99 mg/dL    Signed: Jessee Avers, MD 01/03/2014, 8:45 AM

## 2014-01-01 ENCOUNTER — Encounter: Payer: Self-pay | Admitting: Internal Medicine

## 2014-01-01 DIAGNOSIS — E1310 Other specified diabetes mellitus with ketoacidosis without coma: Principal | ICD-10-CM

## 2014-01-01 DIAGNOSIS — L989 Disorder of the skin and subcutaneous tissue, unspecified: Secondary | ICD-10-CM

## 2014-01-01 DIAGNOSIS — F1410 Cocaine abuse, uncomplicated: Secondary | ICD-10-CM

## 2014-01-01 LAB — GLUCOSE, CAPILLARY
Glucose-Capillary: 438 mg/dL — ABNORMAL HIGH (ref 70–99)
Glucose-Capillary: 384 mg/dL — ABNORMAL HIGH (ref 70–99)
Glucose-Capillary: 245 mg/dL — ABNORMAL HIGH (ref 70–99)
Glucose-Capillary: 197 mg/dL — ABNORMAL HIGH (ref 70–99)
Glucose-Capillary: 299 mg/dL — ABNORMAL HIGH (ref 70–99)

## 2014-01-01 LAB — BASIC METABOLIC PANEL
ANION GAP: 15 (ref 5–15)
BUN: 13 mg/dL (ref 6–23)
CHLORIDE: 100 meq/L (ref 96–112)
CO2: 20 mEq/L (ref 19–32)
CREATININE: 1.16 mg/dL — AB (ref 0.50–1.10)
Calcium: 7.9 mg/dL — ABNORMAL LOW (ref 8.4–10.5)
GFR calc Af Amer: 60 mL/min — ABNORMAL LOW (ref >90)
GFR, EST NON AFRICAN AMERICAN: 52 mL/min — AB (ref >90)
Glucose, Bld: 381 mg/dL — ABNORMAL HIGH (ref 70–99)
Potassium: 4.2 mEq/L (ref 3.7–5.3)
Sodium: 135 mEq/L — ABNORMAL LOW (ref 137–147)

## 2014-01-01 LAB — URINE MICROSCOPIC-ADD ON

## 2014-01-01 LAB — URINALYSIS, ROUTINE W REFLEX MICROSCOPIC
Bilirubin Urine: NEGATIVE
Glucose, UA: >1000 mg/dL — AB
Hgb urine dipstick: NEGATIVE
Ketones, ur: NEGATIVE mg/dL
Nitrite: NEGATIVE
Protein, ur: NEGATIVE mg/dL
Specific Gravity, Urine: 1.022 (ref 1.005–1.030)
Urobilinogen, UA: 0.2 mg/dL (ref 0.0–1.0)
pH: 5.5 (ref 5.0–8.0)

## 2014-01-01 LAB — GLUCOSE, RANDOM: Glucose, Bld: 434 mg/dL — ABNORMAL HIGH (ref 70–99)

## 2014-01-01 MED ORDER — SODIUM CHLORIDE 0.9 % IV SOLN: INTRAVENOUS | Status: AC

## 2014-01-01 MED ORDER — LISINOPRIL 20 MG PO TABS
20.0000 mg | ORAL_TABLET | Freq: Every day | ORAL | Status: DC
  Administered 2014-01-01 – 2014-01-03 (×3): 20 mg via ORAL
  Filled 2014-01-01 (×3): qty 1

## 2014-01-01 MED ORDER — INSULIN ASPART 100 UNIT/ML ~~LOC~~ SOLN
18.0000 [IU] | Freq: Once | SUBCUTANEOUS | Status: AC
  Administered 2014-01-01: 18 [IU] via SUBCUTANEOUS

## 2014-01-01 MED ORDER — INSULIN GLARGINE 100 UNIT/ML ~~LOC~~ SOLN
46.0000 [IU] | Freq: Every day | SUBCUTANEOUS | Status: DC
  Administered 2014-01-01: 46 [IU] via SUBCUTANEOUS
  Filled 2014-01-01 (×2): qty 0.46

## 2014-01-01 NOTE — Progress Notes (Signed)
CARE MANAGEMENT NOTE 01/01/2014  Patient:  [name], [name]   Account Number:  [id number]  Date Initiated:  01/01/2014  Documentation initiated by:  [facility]  Subjective/Objective Assessment:   DKA Type 2     Action/Plan:   lives with husband   Anticipated DC Date:  01/02/2014   Anticipated DC Plan:  [place]  CM consult      Choice offered to / List presented to:             Status of service:   Medicare Important Message given?  YES (If response is "NO", the following Medicare IM given date fields will be blank) Date Medicare IM given:  01/01/2014 Medicare IM given by:  Tomi Bamberger Date Additional Medicare IM given:   Additional Medicare IM given by:    Discharge Disposition:    Per UR Regulation:    If discussed at Long Length of Stay Meetings, dates discussed:    Comments:  01/01/2014 1120 NCM will continue to follow for dc needs. Jonnie Finner RN CCM Case Mgmt phone [phone]

## 2014-01-01 NOTE — Progress Notes (Signed)
Internal Medicine Attending  Date: 01/01/2014  Patient name: [name] Medical record number: [id number] Date of birth: 1956/02/21 Age: 58 y.o. Gender: female  I saw and evaluated the patient, and discussed her care with resident on A.M rounds.  I reviewed the resident's note by Dr. Alice Rieger and I agree with the resident's findings and plans as documented in his note.

## 2014-01-01 NOTE — Progress Notes (Signed)
Patient ID: [name], female   DOB: February 04, 1956, 58 y.o.   MRN: [id number]  I received authorization forms from [place] for her cane and home health RN. I will sign off on both as I feel it is a medical necessity for her given her current state of health.

## 2014-01-01 NOTE — Progress Notes (Signed)
I saw and evaluated the patient.  I personally confirmed the key portions of the history and exam documented by Dr. Arcelia Jew and I reviewed pertinent patient test results.  The assessment, diagnosis, and plan were formulated together and I agree with the documentation in the resident's note.  [name] has blood work consistent with HHS, likely due to returning to previous dose of insulin.  She will likely need bedside guided teaching re: the correct insulin dosing prior to her next discharge.

## 2014-01-01 NOTE — Progress Notes (Signed)
I have seen the patient and reviewed the daily progress note by Lenise Arena MS IV and discussed the care of the patient with them.  Please see note for my findings, assessment, and plans/additions.   Subjective: She continues to feels a little poorly today. She has been afebrile since admission. She reports a wound at her bottom which she noticed yesterday. She also reports "discomfort" on passing urine since yesterday but she denies other urinary symptoms. She also reports pain on deep inspiration, but without shortness of breath, or cough. Her blood sugar remains elevated with a total of 72 units of both Lantus and short-acting insulin.   Objective: Vital signs in last 24 hours: Filed Vitals:   01/01/14 0554 01/01/14 0919 01/01/14 1000 01/01/14 1410  BP: 117/66  116/66 102/53  Pulse: 73  70 72  Temp: 97.7 F (36.5 C)  98.1 F (36.7 C) 97.7 F (36.5 C)  TempSrc: Oral  Oral Oral  Resp: 15  16 16   Height:      Weight:      SpO2: 95% 91% 90% 92%   Weight change:   Intake/Output Summary (Last 24 hours) at 01/01/14 1513 Last data filed at 01/01/14 1423  Gross per 24 hour  Intake    876 ml  Output   1450 ml  Net   -574 ml   General: Vital signs reviewed. She reports feeling tired Lungs: Clear bilaterally Heart: Regular rate and rhythm; no extra sounds or murmurs  Abdomen: Mild abdominal tenderness, which is generalized, normal bowel sounds present, soft; no hepatosplenomegaly  non-draining, superficial <1cm skin lesion left side of intergluteal clef. No signs of infection or inflammation. Extremities: No bilateral ankle edema  Neurologic: Alert and oriented x3. Moves all extremities.   Lab Results: Basic Metabolic Panel:  Recent Labs Lab 12/30/13 2246 12/31/13 0033 01/01/14 0511 01/01/14 0836  NA 134* 135* 135*  --   K 3.8 3.8 4.2  --   CL 99 99 100  --   CO2 21 21 20   --   GLUCOSE 285* 432* 381* 434*  BUN 16 15 13   --   CREATININE 1.04 1.01 1.16*  --   CALCIUM 8.6 8.0*  7.9*  --   MG 1.9  --   --   --   PHOS 2.8  --   --   --    Liver Function Tests:  Recent Labs Lab 12/30/13 1521  AST 16  ALT 20  ALKPHOS 138*  BILITOT 0.3  PROT 7.0  ALBUMIN 3.5   CBC:  Recent Labs Lab 12/30/13 2058  WBC 8.0  HGB 13.1  HCT 39.3  MCV 87.9  PLT 296   CBG:  Recent Labs Lab 12/31/13 1125 12/31/13 1718 12/31/13 2148 01/01/14 0804 01/01/14 0943 01/01/14 1136  GLUCAP 336* 298* 217* 438* 384* 245*    Recent Labs Lab 12/30/13 2058  TSH 67.190*   Urine Drug Screen: Drugs of Abuse     Component Value Date/Time   LABOPIA NONE DETECTED 12/30/2013 2058   LABOPIA NEG 01/23/2013 1507   COCAINSCRNUR POSITIVE* 12/30/2013 2058   COCAINSCRNUR NEG 01/23/2013 1507   LABBENZ NONE DETECTED 12/30/2013 2058   LABBENZ NEG 01/23/2013 1507   AMPHETMU NONE DETECTED 12/30/2013 2058   THCU NONE DETECTED 12/30/2013 2058   LABBARB NONE DETECTED 12/30/2013 2058   LABBARB NEG 01/23/2013 1507    Urinalysis:  Recent Labs Lab 12/30/13 2058  COLORURINE YELLOW  LABSPEC 1.031*  PHURINE 5.0  GLUCOSEU >1000*  HGBUR  NEGATIVE  BILIRUBINUR NEGATIVE  KETONESUR NEGATIVE  PROTEINUR NEGATIVE  UROBILINOGEN 0.2  NITRITE NEGATIVE  LEUKOCYTESUR TRACE*   Micro Results: No results found for this or any previous visit (from the past 240 hour(s)). Studies/Results: No results found. Medications: I have reviewed the patient's current medications. Scheduled Meds: . allopurinol  300 mg Oral Daily  . ALPRAZolam  0.5 mg Oral TID  . ARIPiprazole  5 mg Oral q morning - 10a  . aspirin EC  81 mg Oral Daily  . atorvastatin  80 mg Oral q morning - 10a  . divalproex  1,000 mg Oral QHS  . gabapentin  300 mg Oral QID  . gemfibrozil  600 mg Oral Daily  . heparin  5,000 Units Subcutaneous 3 times per day  . insulin aspart  0-20 Units Subcutaneous TID WC  . insulin aspart  0-5 Units Subcutaneous QHS  . insulin glargine  46 Units Subcutaneous QHS  . latanoprost  1 drop Both Eyes q morning - 10a   . levothyroxine  100 mcg Oral QAC breakfast  . lisinopril  20 mg Oral Daily  . mometasone-formoterol  2 puff Inhalation BID  . pantoprazole  40 mg Oral Daily  . sertraline  100 mg Oral Daily  . timolol  1 drop Both Eyes TID  . traZODone  50 mg Oral QHS   Continuous Infusions:   PRN Meds:.albuterol, dextrose, nitroGLYCERIN, ondansetron (ZOFRAN) IV, traMADol Assessment/Plan: 58 year old female with hx of uncontrolled DM2, substance abuse (with recent cocaine), HTN, and depression presented with to opc from home with persistently high blood sugars with hyperglycemic symptoms. Her meter was reading "HIGH" all day. She was found to have +cocaine on UDS.  DKA versus HSS: CBGs continue to remain high despite high insulin dose regimen. She has uncontrolled DM with a1c >14%. Also likely exacerbated in setting of substance abuse with cocaine. CBGs >600 admission. Bicarbonate of 20, and anion gap of 15. She is on a diet - carb modified. No identifiable source of infection that can explain the persistent hyperglycemia despite substantial insulin TDD. Plan  - cont with IVF over night at 100cc/hr of NS for another 20 hrs since she still has a low bicarbonate - cont with carb modified diet  - She received 36 units of Lantus last night. Morning CBGs 300-400. She required a 10 units of NovoLog this morning prebreakfast. Will increase Lantus by 10 units to 46 units each bedtime - cont with SSI resistant  - Continue with qhs coverage   - urinalysis to rule out a UTI. On admission her UA was negative. - Appreciate diabetic educator recommendations  - Plan will be to discharge her on appropriate doses of insulin once we have her blood sugars controlled.  Skin wound: non-draining, non-erythematous superficial <1cm skin lesion left side of intergluteal clef, less concerning for MRSA infection. It's unlikely that this is the source of infection that could be driving her elevated blood sugars. - Clinically  continue to monitor  - Will consider antibiotic coverage if concerning for MRSA infection  Acute on chronic CKD 2: Resolved. -trend renal function   Substance abuse--last use of cocaine over the weekend. Strongly encouraged cessation. Consult Education officer, [facility] for drug abuse cessation. She denies chest pain at this time. EKG from this am without ischemic changes. Troponin negative Plan - keep in tele - do ekg if she has chest pain   Hypothyroidism: Last TSH 93 10/2013 and synthroid dose was increased to 80mcg. TSH still very  high 67.190. Plan  - Continue with Synthroid to 19mcg  - follow up TSH as outpatient.   Chronic back pain: restart home medication including Tramadol and Gabapentin.   HTN--normotensive on admission. Home medications include lisinopril 40mg  and norvasc 5mg . We can restarted if BP is high.    Diet: carb modified.   Dvt Ppx: Heparin   Dispo: Disposition is deferred at this time, awaiting improvement of current medical problems.  She will likely be discharged over the weekend once we have figured out her insulin requirement.   The patient does have a current PCP Posey Pronto, [place], MD), therefore is requiring OPC follow-up after discharge.   The patient does have transportation limitations that hinder transportation to clinic appointments.  .Services Needed at time of discharge: Y = Yes, Blank = No PT:   OT:   RN:   Equipment:   Other:     LOS: 2 days   Jessee Avers PGY 3 - Internal Medicine Teaching Service Pager: [phone] 01/01/2014, 3:13 PM

## 2014-01-01 NOTE — Progress Notes (Signed)
Subjective: Today, [name] was feeling fatigued and disconcerted about her elevated blood glucose level. Her blood glucose continues to remain elevated even with an increase in total insulin (52 units -> 71 units) given over the past 24 hours. She reports having low blood glucose levels once this year and that it has occurred often a few years ago. She was drinking cranberry juice this morning when we arrived. She endorses back pain that started yesterday. In addition, she has been having discomfort with urination that started yesterday. Endorses pain with deep inspiration. She denies shortness of breath and cough.   Objective: Vital signs in last 24 hours: Filed Vitals:   01/01/14 0145 01/01/14 0554 01/01/14 0919 01/01/14 1000  BP: 118/60 117/66  116/66  Pulse: 71 73  70  Temp: 97.6 F (36.4 C) 97.7 F (36.5 C)  98.1 F (36.7 C)  TempSrc: Oral Oral  Oral  Resp: 15 15  16   Height:      Weight:      SpO2: 90% 95% 91% 90%   Weight change:   Intake/Output Summary (Last 24 hours) at 01/01/14 1305 Last data filed at 01/01/14 0920  Gross per 24 hour  Intake   1956 ml  Output   1350 ml  Net    606 ml   BP 116/66  Pulse 70  Temp(Src) 98.1 F (36.7 C) (Oral)  Resp 16  Ht 5\' 2"  (1.575 m)  Wt 105.643 kg (232 lb 14.4 oz)  BMI 42.59 kg/m2  SpO2 90% General appearance: cooperative, sleepy, slow speech Back: CVA tenderness bilaterally, non-draining, superficial <1cm skin lesion left side of intergluteal clef Lungs: clear to auscultation bilaterally Heart: regular rate and rhythm, S1, S2 normal, no murmur, click, rub or gallop Abdomen: Mild tenderness to palpation in mid-epigastric region, normal bowel sounds Extremities: extremities normal, atraumatic, no cyanosis or edema  Lab Results: Basic Metabolic Panel:  Recent Labs Lab 12/30/13 2246 12/31/13 0033 01/01/14 0511 01/01/14 0836  NA 134* 135* 135*  --   K 3.8 3.8 4.2  --   CL 99 99 100  --   CO2 21 21 20   --   GLUCOSE  285* 432* 381* 434*  BUN 16 15 13   --   CREATININE 1.04 1.01 1.16*  --   CALCIUM 8.6 8.0* 7.9*  --   MG 1.9  --   --   --   PHOS 2.8  --   --   --    Liver Function Tests:  Recent Labs Lab 12/30/13 1521  AST 16  ALT 20  ALKPHOS 138*  BILITOT 0.3  PROT 7.0  ALBUMIN 3.5   CBC:  Recent Labs Lab 12/30/13 2058  WBC 8.0  HGB 13.1  HCT 39.3  MCV 87.9  PLT 296   Cardiac Enzymes:  Recent Labs Lab 12/31/13 1542  TROPONINI <0.30   CBG:  Recent Labs Lab 12/31/13 1125 12/31/13 1718 12/31/13 2148 01/01/14 0804 01/01/14 0943 01/01/14 1136  GLUCAP 336* 298* 217* 438* 384* 245*   Thyroid Function Tests:  Recent Labs Lab 12/30/13 2058  TSH 67.190*   Urine Drug Screen: Drugs of Abuse     Component Value Date/Time   LABOPIA NONE DETECTED 12/30/2013 2058   LABOPIA NEG 01/23/2013 1507   COCAINSCRNUR POSITIVE* 12/30/2013 2058   COCAINSCRNUR NEG 01/23/2013 1507   LABBENZ NONE DETECTED 12/30/2013 2058   LABBENZ NEG 01/23/2013 1507   AMPHETMU NONE DETECTED 12/30/2013 2058   THCU NONE DETECTED 12/30/2013 2058  LABBARB NONE DETECTED 12/30/2013 2058   LABBARB NEG 01/23/2013 1507    Urinalysis:  Recent Labs Lab 12/30/13 2058  COLORURINE YELLOW  LABSPEC 1.031*  PHURINE 5.0  GLUCOSEU >1000*  HGBUR NEGATIVE  BILIRUBINUR NEGATIVE  KETONESUR NEGATIVE  PROTEINUR NEGATIVE  UROBILINOGEN 0.2  NITRITE NEGATIVE  LEUKOCYTESUR TRACE*   Medications:  Scheduled Meds: . allopurinol  300 mg Oral Daily  . ALPRAZolam  0.5 mg Oral TID  . ARIPiprazole  5 mg Oral q morning - 10a  . aspirin EC  81 mg Oral Daily  . atorvastatin  80 mg Oral q morning - 10a  . divalproex  1,000 mg Oral QHS  . gabapentin  300 mg Oral QID  . gemfibrozil  600 mg Oral Daily  . heparin  5,000 Units Subcutaneous 3 times per day  . insulin aspart  0-20 Units Subcutaneous TID WC  . insulin aspart  0-5 Units Subcutaneous QHS  . insulin glargine  46 Units Subcutaneous QHS  . latanoprost  1 drop Both Eyes q  morning - 10a  . levothyroxine  100 mcg Oral QAC breakfast  . lisinopril  20 mg Oral Daily  . mometasone-formoterol  2 puff Inhalation BID  . pantoprazole  40 mg Oral Daily  . sertraline  100 mg Oral Daily  . timolol  1 drop Both Eyes TID  . traZODone  50 mg Oral QHS   Continuous Infusions:   PRN Meds:.albuterol, dextrose, nitroGLYCERIN, ondansetron (ZOFRAN) IV, traMADol  Assessment/Plan: Principal Problem:   DKA, type 2, not at goal Active Problems:   Hypothyroidism   Chronic back pain   Cocaine abuse   AKI (acute kidney injury)  58 year old female with hx of uncontrolled DM2, substance abuse (with recent cocaine), HTN, and depression presented with persistently high blood sugars with hyperglycemic symptoms. Her meter was reading "HIGH" all day. She was found to have +cocaine on UDS.   DKA: her symptoms are improving but she continues to remain hyperglycemic. She has uncontrolled DM with a1c >14%. Also likely exacerbated in setting of substance abuse with cocaine. CBGs >600 admission. CBGs today between 300s-400s, with Lantus at 36U and resistant SSI. Considering possible infectious source contributing to continued hyperglycemia.  - completed IV fluids - cont with carb modified diet  - increase lantus to 46 units at bed time - continue resistant SSI  - add qhs coverage  - monitor and replace electrolytes. AG is 21 today with CO2 of 20  - diabetic educator consult, appreciate recs  - U/A for possible UTI  - monitor for s/s of pneumonia  Acute on chronic CKD 2: Resolved, Cr 1.16 today. On admission her Cr up to 1.35 likely pre-renal in setting of hypovolemia with hyperglycemia.  - trend renal function   Substance abuse: Last use of cocaine over the weekend. Strongly encouraged cessation. She denies chest pain at this time. EKG from yesterday showed no ischemic changes and negative troponin I x1.   - monitor for s/s of cocaine use  Hypothyroidism: Last TSH 93 10/2013 and  synthroid dose was increased to 35mcg. TSH still very high 67.190.  - continue Synthroid at 172mcg  - follow up TSH as outpatient.   Skin wound: non-draining, non-erythematous superficial <1cm skin lesion left side of intergluteal clef, less concerning for MRSA infection.  - continue to monitor - will consider doxycycline 100 mg PO BID, if concerning for MRSA infection  Chronic back pain: restart home medication including Tramadol and Gabapentin.   HTN:  normotensive on admission. Home medications include lisinopril 40mg  and norvasc 5mg . We can restart if BP is high.  - started lisinopril 20 mg in the setting of normal BP  Diet: carb modified  Dvt Ppx: Heparin   Dispo: Disposition is deferred at this time, awaiting improvement of current medical problems. Anticipated discharge in approximately tomorrow once symptoms are better and insulin therapy for outpatient is clarified.   This is a Careers information officer Note.  The care of the patient was discussed with Dr. Alice Rieger and the assessment and plan formulated with their assistance.  Please see their attached note for official documentation of the daily encounter.   LOS: 2 days   [name], Med Student 01/01/2014, 1:05 PM

## 2014-01-02 LAB — BASIC METABOLIC PANEL
ANION GAP: 12 (ref 5–15)
BUN: 15 mg/dL (ref 6–23)
CO2: 24 meq/L (ref 19–32)
CREATININE: 1.15 mg/dL — AB (ref 0.50–1.10)
Calcium: 8.6 mg/dL (ref 8.4–10.5)
Chloride: 101 mEq/L (ref 96–112)
GFR calc Af Amer: 60 mL/min — ABNORMAL LOW (ref >90)
GFR calc non Af Amer: 52 mL/min — ABNORMAL LOW (ref >90)
GLUCOSE: 286 mg/dL — AB (ref 70–99)
Potassium: 4.1 mEq/L (ref 3.7–5.3)
Sodium: 137 mEq/L (ref 137–147)

## 2014-01-02 LAB — TROPONIN I: Troponin I: <0.3 ng/mL (ref <0.30)

## 2014-01-02 LAB — GLUCOSE, CAPILLARY
GLUCOSE-CAPILLARY: 216 mg/dL — AB (ref 70–99)
GLUCOSE-CAPILLARY: 252 mg/dL — AB (ref 70–99)
Glucose-Capillary: 209 mg/dL — ABNORMAL HIGH (ref 70–99)
Glucose-Capillary: 215 mg/dL — ABNORMAL HIGH (ref 70–99)
Glucose-Capillary: 269 mg/dL — ABNORMAL HIGH (ref 70–99)

## 2014-01-02 MED ORDER — PROMETHAZINE HCL 25 MG PO TABS: 12.5000 mg | ORAL_TABLET | Freq: Four times a day (QID) | ORAL | Status: DC | PRN

## 2014-01-02 MED ORDER — INSULIN GLARGINE 100 UNIT/ML ~~LOC~~ SOLN
52.0000 [IU] | Freq: Every day | SUBCUTANEOUS | Status: DC
  Administered 2014-01-02: 52 [IU] via SUBCUTANEOUS
  Filled 2014-01-02 (×2): qty 0.52

## 2014-01-02 MED ORDER — ALUM & MAG HYDROXIDE-SIMETH 200-200-20 MG/5ML PO SUSP
15.0000 mL | Freq: Four times a day (QID) | ORAL | Status: DC | PRN
  Administered 2014-01-02: 15 mL via ORAL
  Filled 2014-01-02: qty 30

## 2014-01-02 NOTE — Progress Notes (Signed)
MD, patient is requesting a vaginal cream to ward off any infection.  She stated that she gets yeast infections when taking a lot of medication.

## 2014-01-02 NOTE — Progress Notes (Signed)
Notified MD that patient is still very drowsy, but arousable and responds to commands. RN told to not give scheduled trazodone and xanax

## 2014-01-02 NOTE — Progress Notes (Signed)
Pt BP 161/101. CBG 215. Paged provider on call who responded at 8:13pm. Notified MD of pt BP and that patient is drowsy but responds to voice and is alert and oriented. No new orders given. MD stated to notify if systolic is > 161. Will continue to monitor patients condition.

## 2014-01-02 NOTE — Progress Notes (Signed)
Daily Progress Note  Subjective: Patient reports ongoing epigastric pain which is positional in nature. Her epigastric pain is chronic in nature and tends to recur. No nausea or vomiting. She has not bitter taste in her mouth. She has otherwise felt better since yesterday. Her CBGs are better since increasing Lantus dose yesterday.   Objective: Vital signs in last 24 hours: Filed Vitals:   01/01/14 2023 01/01/14 2144 01/01/14 2318 01/02/14 0331  BP: 133/84  120/75 125/75  Pulse: 76  71 73  Temp: 97.4 F (36.3 C)  97.5 F (36.4 C) 97.6 F (36.4 C)  TempSrc: Oral  Oral Oral  Resp: 15  12 12   Height:      Weight:      SpO2: 94% 94% 91% 94%   Weight change:   Intake/Output Summary (Last 24 hours) at 01/02/14 8416 Last data filed at 01/01/14 2318  Gross per 24 hour  Intake    415 ml  Output   1500 ml  Net  -1085 ml   General: Vital signs reviewed. No distress. Lungs: Clear bilaterally Heart: Regular rate and rhythm; no extra sounds or murmurs  Abdomen: Mild abdominal tenderness in the epigastric area, normal bowel sounds present, soft; no hepatosplenomegaly. Non-draining, superficial <1cm skin lesion left side of intergluteal clef. No signs of infection or inflammation. Unchanged from yesterdays exam by me.   Extremities: No bilateral ankle edema  Neurologic: Alert and oriented x3. Moves all extremities.   Lab Results: Basic Metabolic Panel:  Recent Labs Lab 12/30/13 2246 12/31/13 0033 01/01/14 0511 01/01/14 0836  NA 134* 135* 135*  --   K 3.8 3.8 4.2  --   CL 99 99 100  --   CO2 21 21 20   --   GLUCOSE 285* 432* 381* 434*  BUN 16 15 13   --   CREATININE 1.04 1.01 1.16*  --   CALCIUM 8.6 8.0* 7.9*  --   MG 1.9  --   --   --   PHOS 2.8  --   --   --    Liver Function Tests:  Recent Labs Lab 12/30/13 1521  AST 16  ALT 20  ALKPHOS 138*  BILITOT 0.3  PROT 7.0  ALBUMIN 3.5   CBC:  Recent Labs Lab 12/30/13 2058  WBC 8.0  HGB 13.1  HCT 39.3  MCV 87.9   PLT 296   CBG:  Recent Labs Lab 12/31/13 2148 01/01/14 0804 01/01/14 0943 01/01/14 1136 01/01/14 1648 01/01/14 2135  GLUCAP 217* 438* 384* 245* 197* 299*    Recent Labs Lab 12/30/13 2058  TSH 67.190*   Urine Drug Screen: Drugs of Abuse     Component Value Date/Time   LABOPIA NONE DETECTED 12/30/2013 2058   LABOPIA NEG 01/23/2013 1507   COCAINSCRNUR POSITIVE* 12/30/2013 2058   COCAINSCRNUR NEG 01/23/2013 1507   LABBENZ NONE DETECTED 12/30/2013 2058   LABBENZ NEG 01/23/2013 1507   AMPHETMU NONE DETECTED 12/30/2013 2058   THCU NONE DETECTED 12/30/2013 2058   LABBARB NONE DETECTED 12/30/2013 2058   LABBARB NEG 01/23/2013 1507    Urinalysis:  Recent Labs Lab 12/30/13 2058 01/01/14 1430  COLORURINE YELLOW YELLOW  LABSPEC 1.031* 1.022  PHURINE 5.0 5.5  GLUCOSEU >1000* >1000*  HGBUR NEGATIVE NEGATIVE  BILIRUBINUR NEGATIVE NEGATIVE  KETONESUR NEGATIVE NEGATIVE  PROTEINUR NEGATIVE NEGATIVE  UROBILINOGEN 0.2 0.2  NITRITE NEGATIVE NEGATIVE  LEUKOCYTESUR TRACE* MODERATE*   Medications: I have reviewed the patient's current medications. Scheduled Meds: . allopurinol  300 mg  Oral Daily  . ALPRAZolam  0.5 mg Oral TID  . ARIPiprazole  5 mg Oral q morning - 10a  . aspirin EC  81 mg Oral Daily  . atorvastatin  80 mg Oral q morning - 10a  . divalproex  1,000 mg Oral QHS  . gabapentin  300 mg Oral QID  . gemfibrozil  600 mg Oral Daily  . heparin  5,000 Units Subcutaneous 3 times per day  . insulin aspart  0-20 Units Subcutaneous TID WC  . insulin aspart  0-5 Units Subcutaneous QHS  . insulin glargine  46 Units Subcutaneous QHS  . latanoprost  1 drop Both Eyes q morning - 10a  . levothyroxine  100 mcg Oral QAC breakfast  . lisinopril  20 mg Oral Daily  . mometasone-formoterol  2 puff Inhalation BID  . pantoprazole  40 mg Oral Daily  . sertraline  100 mg Oral Daily  . timolol  1 drop Both Eyes TID  . traZODone  50 mg Oral QHS   Continuous Infusions: . sodium chloride Stopped  (01/01/14 1100)   PRN Meds:.albuterol, dextrose, nitroGLYCERIN, ondansetron (ZOFRAN) IV, traMADol Assessment/Plan: 58 year old female with hx of uncontrolled DM2, substance abuse (with recent cocaine), HTN, and depression presented with to opc from home with persistently high blood sugars with hyperglycemic symptoms. Her meter was reading "HIGH" all day. She was found to have +cocaine on UDS.  DKA versus HSS: Improved. She has uncontrolled DM with a1c >14%. Also likely exacerbated in setting of substance abuse with cocaine. CBGs >600 admission. CBGs cont to improved with increased lantus dose. No identifiable source of infection that can explain the persistent hyperglycemia despite substantial insulin TDD. Her gluteal wound does not appear infected at all. Repeat yesterday U/A negative. She lacks symptoms of PNA. Plan  - off IVF cont with carb modified diet  - cont with Lantus 46 units QHS - cont with SSI resistant  - Continue with qhs coverage   - Will discharge her later today if she feels better - Appreciate diabetic educator recommendations   Epigastric pain: The patient describes chronic epigastric pain, which worsened yesterday. No nausea or vomiting. On examination, he is tender in the epigastric area.she reports, that she has responded well to Phenergan previously.  Plan. -Trial of oral Phenergan when necessary -Patient refuses a GI cocktail, due to vomiting with it before. -Will try simethicone. - she was rule out for cardiac cause of her epigastric pain with negative trop and normal ekg  Skin wound: non-draining, non-erythematous superficial <1cm skin lesion left side of intergluteal clef, less concerning for a soft tissue infection. On exam it looks better than yesterday.  It's unlikely that this is the source of infection that could be driving her elevated blood sugars. - Clinically continue to monitor  - may consider antibiotic coverage if needed  Acute on chronic CKD 2:  Resolved. -trend renal function   Substance abuse--last use of cocaine over the weekend. Strongly encouraged cessation. Consult Education officer, [facility] for drug abuse cessation. She denies chest pain at this time. EKG from this am without ischemic changes. Troponin negative Plan - keep in tele - do ekg if she has chest pain   Hypothyroidism: Last TSH 93 10/2013 and synthroid dose was increased to 21mcg. TSH still very high 67.190. Plan  - Continue with Synthroid to 123mcg  - follow up TSH as outpatient.   Chronic back pain: restart home medication including Tramadol and Gabapentin.   HTN--normotensive on admission and  remains stable. Home medications include lisinopril 40mg  and norvasc 5mg . We can restarted if BP is high.    Diet: carb modified.   Dvt Ppx: Heparin   Dispo: Disposition is deferred at this time, awaiting improvement of current medical problems.  She will likely be discharged over the weekend once we have figured out her insulin requirement.   The patient does have a current PCP Posey Pronto, [place], MD), therefore is requiring OPC follow-up after discharge.   The patient does have transportation limitations that hinder transportation to clinic appointments.  .Services Needed at time of discharge: Y = Yes, Blank = No PT:   OT:   RN:   Equipment:   Other:     LOS: 3 days   Jessee Avers PGY 3 - Internal Medicine Teaching Service Pager: [phone] 01/02/2014, 6:27 AM

## 2014-01-03 ENCOUNTER — Other Ambulatory Visit: Payer: Self-pay

## 2014-01-03 LAB — BASIC METABOLIC PANEL
Anion gap: 12 (ref 5–15)
BUN: 13 mg/dL (ref 6–23)
CALCIUM: 9.1 mg/dL (ref 8.4–10.5)
CO2: 27 meq/L (ref 19–32)
CREATININE: 1.1 mg/dL (ref 0.50–1.10)
Chloride: 102 mEq/L (ref 96–112)
GFR calc Af Amer: 64 mL/min — ABNORMAL LOW (ref >90)
GFR calc non Af Amer: 55 mL/min — ABNORMAL LOW (ref >90)
GLUCOSE: 135 mg/dL — AB (ref 70–99)
Potassium: 4.2 mEq/L (ref 3.7–5.3)
Sodium: 141 mEq/L (ref 137–147)

## 2014-01-03 LAB — GLUCOSE, CAPILLARY
GLUCOSE-CAPILLARY: 158 mg/dL — AB (ref 70–99)
Glucose-Capillary: 219 mg/dL — ABNORMAL HIGH (ref 70–99)

## 2014-01-03 LAB — TROPONIN I: Troponin I: <0.3 ng/mL (ref <0.30)

## 2014-01-03 MED ORDER — INSULIN GLARGINE 100 UNITS/ML SOLOSTAR PEN: 52.0000 [IU] | PEN_INJECTOR | Freq: Every day | SUBCUTANEOUS | Status: DC

## 2014-01-03 MED ORDER — INSULIN ASPART 100 UNIT/ML FLEXPEN: PEN_INJECTOR | SUBCUTANEOUS | Status: DC

## 2014-01-03 MED ORDER — LEVOTHYROXINE SODIUM 100 MCG PO TABS: 100.0000 ug | ORAL_TABLET | Freq: Every day | ORAL | Status: DC

## 2014-01-03 NOTE — Progress Notes (Signed)
Patient found on floor by NT, Torie. She was up without assist, bed alarm was not on. Patient was stripping linens on bed lost balance and fell onto her bottom. Patient stated she was not hurt. VSS Dr. Ree Kida still ok for D/C.

## 2014-01-03 NOTE — Progress Notes (Signed)
Daily Progress Note  Subjective: She slept well.  Had chest pain yesterday but EKG and trop were negative  Her CBGs are better since increasing Lantus to 52 units  Objective: Vital signs in last 24 hours: Filed Vitals:   01/02/14 2024 01/02/14 2128 01/02/14 2337 01/03/14 0356  BP:  127/79 128/87 135/84  Pulse:   56 57  Temp:   97.6 F (36.4 C) 97.8 F (36.6 C)  TempSrc:   Oral Oral  Resp:   15 12  Height:      Weight:      SpO2: 93%  94% 94%   Weight change:   Intake/Output Summary (Last 24 hours) at 01/03/14 0845 Last data filed at 01/03/14 0810  Gross per 24 hour  Intake      0 ml  Output    850 ml  Net   -850 ml   General: Vital signs reviewed. No distress. Lungs: Clear bilaterally Heart: Regular rate and rhythm; no extra sounds or murmurs  Abdomen: Mild abdominal tenderness in the epigastric area, normal bowel sounds present, soft; no hepatosplenomegaly. Non-draining, superficial <1cm skin lesion left side of intergluteal clef. No signs of infection or inflammation. Appears better.  Extremities: No bilateral ankle edema  Neurologic: Alert and oriented x3. Moves all extremities.   Lab Results: Basic Metabolic Panel:  Recent Labs Lab 12/30/13 2246  01/02/14 0640 01/03/14 0041  NA 134*  < > 137 141  K 3.8  < > 4.1 4.2  CL 99  < > 101 102  CO2 21  < > 24 27  GLUCOSE 285*  < > 286* 135*  BUN 16  < > 15 13  CREATININE 1.04  < > 1.15* 1.10  CALCIUM 8.6  < > 8.6 9.1  MG 1.9  --   --   --   PHOS 2.8  --   --   --   < > = values in this interval not displayed. Liver Function Tests:  Recent Labs Lab 12/30/13 1521  AST 16  ALT 20  ALKPHOS 138*  BILITOT 0.3  PROT 7.0  ALBUMIN 3.5   CBC:  Recent Labs Lab 12/30/13 2058  WBC 8.0  HGB 13.1  HCT 39.3  MCV 87.9  PLT 296   CBG:  Recent Labs Lab 01/02/14 0805 01/02/14 1156 01/02/14 1652 01/02/14 1955 01/02/14 2102 01/03/14 0748  GLUCAP 216* 252* 209* 215* 269* 158*    Recent Labs Lab  12/30/13 2058  TSH 67.190*   Urine Drug Screen: Drugs of Abuse     Component Value Date/Time   LABOPIA NONE DETECTED 12/30/2013 2058   LABOPIA NEG 01/23/2013 1507   COCAINSCRNUR POSITIVE* 12/30/2013 2058   COCAINSCRNUR NEG 01/23/2013 1507   LABBENZ NONE DETECTED 12/30/2013 2058   LABBENZ NEG 01/23/2013 1507   AMPHETMU NONE DETECTED 12/30/2013 2058   THCU NONE DETECTED 12/30/2013 2058   LABBARB NONE DETECTED 12/30/2013 2058   LABBARB NEG 01/23/2013 1507    Urinalysis:  Recent Labs Lab 12/30/13 2058 01/01/14 1430  COLORURINE YELLOW YELLOW  LABSPEC 1.031* 1.022  PHURINE 5.0 5.5  GLUCOSEU >1000* >1000*  HGBUR NEGATIVE NEGATIVE  BILIRUBINUR NEGATIVE NEGATIVE  KETONESUR NEGATIVE NEGATIVE  PROTEINUR NEGATIVE NEGATIVE  UROBILINOGEN 0.2 0.2  NITRITE NEGATIVE NEGATIVE  LEUKOCYTESUR TRACE* MODERATE*   Medications: I have reviewed the patient's current medications. Scheduled Meds: . allopurinol  300 mg Oral Daily  . ALPRAZolam  0.5 mg Oral TID  . ARIPiprazole  5 mg Oral q morning -  10a  . aspirin EC  81 mg Oral Daily  . atorvastatin  80 mg Oral q morning - 10a  . divalproex  1,000 mg Oral QHS  . gabapentin  300 mg Oral QID  . gemfibrozil  600 mg Oral Daily  . heparin  5,000 Units Subcutaneous 3 times per day  . insulin aspart  0-20 Units Subcutaneous TID WC  . insulin aspart  0-5 Units Subcutaneous QHS  . insulin glargine  52 Units Subcutaneous QHS  . latanoprost  1 drop Both Eyes q morning - 10a  . levothyroxine  100 mcg Oral QAC breakfast  . lisinopril  20 mg Oral Daily  . mometasone-formoterol  2 puff Inhalation BID  . pantoprazole  40 mg Oral Daily  . sertraline  100 mg Oral Daily  . timolol  1 drop Both Eyes TID  . traZODone  50 mg Oral QHS   Continuous Infusions:   PRN Meds:.albuterol, alum & mag hydroxide-simeth, dextrose, nitroGLYCERIN, ondansetron (ZOFRAN) IV, promethazine, traMADol Assessment/Plan: 58 year old female with hx of uncontrolled DM2, substance abuse (with  recent cocaine), HTN, and depression presented with to opc from home with persistently high blood sugars with hyperglycemic symptoms. Her meter was reading "HIGH" all day. She was found to have +cocaine on UDS.  DKA versus HSS: Improved. She has uncontrolled DM with a1c >14%. Also likely exacerbated in setting of substance abuse with cocaine. CBGs >600 admission. CBGs cont to improve with escalation in insulin dose. No identifiable source of infection that can explain the persistent hyperglycemia despite substantial insulin TDD. Her gluteal wound does not appear infected at all and repeat U/A negative. She does not have symptoms of PNA. Plan  - off IVF cont with carb modified diet  - CBGs are better, discharge at a lantus dose 52 units QHS - will start a sliding scale insulin regimen as below For blood sugar 151 - 200 take 4 units  For blood sugar 201 - 250 take 7 units  For blood sugar 251 - 300 take 11 units  For blood sugar 301 - 350 take 15 units  If blood sugar is above 350, please call clinic for instructions. - will follow up in clinic in a few days to make necessary changes to insulin regimen.  Epigastric pain/chest pain: The patient described yesterday.  On examination, she was tender in the epigastric area. Her pain was somehow positional and appeared to be in the epigastric area. EKG and Trop were negative. No nausea or vomiting. Improved somehow with Phenergan and Simethicone. Patient refuses a GI cocktail, due to vomiting with it before. Plan. - cont with PPI as outpatient  - counseled about home conservative management for acid reflux - unlikely to be cardiac in origin.   Skin wound: non-draining, non-erythematous superficial <1cm skin lesion left side of intergluteal clef, less concerning for a soft tissue infection. No evidence of infection on exam. Clinically continue to monitor  - may consider antibiotic coverage if needed  Acute on chronic CKD 2: Resolved. -trend renal  function   Substance abuse--last use of cocaine over the weekend. Strongly encouraged cessation. Consult Education officer, museum for drug abuse cessation.   Hypothyroidism: Last TSH 93 10/2013 and synthroid dose was increased to 9mcg. TSH still very high 67.190. Plan  - Continue with Synthroid to 115mcg  - follow up TSH as outpatient.   Chronic back pain: restart home medication including Tramadol and Gabapentin.   HTN--normotensive on admission and remains stable. Home medications include  lisinopril 40mg  and norvasc 5mg .   Diet: carb modified.   Dvt Ppx: Heparin   Dispo: Disposition is deferred at this time, awaiting improvement of current medical problems.  Discharge home today.   The patient does have a current PCP Posey Pronto, [place], MD), therefore is requiring OPC follow-up after discharge.   The patient does have transportation limitations that hinder transportation to clinic appointments.  .Services Needed at time of discharge: Y = Yes, Blank = No PT:   OT:   RN:   Equipment:   Other:     LOS: 4 days   Jessee Avers PGY 3 - Internal Medicine Teaching Service Pager: [phone] 01/03/2014, 8:45 AM

## 2014-01-03 NOTE — Progress Notes (Signed)
Nsg Discharge Note  Admit Date:  12/30/2013 Discharge date: 01/03/2014   [name] to be D/C'd Home per MD order.  AVS completed.  Copy for chart, and copy for patient signed, and dated. Patient/caregiver able to verbalize understanding.  Discharge Medication:   Medication List         albuterol 108 (90 BASE) MCG/ACT inhaler  Commonly known as:  PROVENTIL HFA;VENTOLIN HFA  Inhale 2 puffs into the lungs every 6 (six) hours as needed for wheezing.     albuterol (2.5 MG/3ML) 0.083% nebulizer solution  Commonly known as:  PROVENTIL  Inhale 2.5 mg into the lungs every 6 (six) hours as needed for wheezing.     allopurinol 300 MG tablet  Commonly known as:  ZYLOPRIM  Take 1 tablet (300 mg total) by mouth daily. For gout.     ALPRAZolam 0.5 MG tablet  Commonly known as:  XANAX  Take 0.5 mg by mouth 3 (three) times daily.     amLODipine 5 MG tablet  Commonly known as:  NORVASC  Take 1 tablet (5 mg total) by mouth daily. For hypertension.     ARIPiprazole 5 MG tablet  Commonly known as:  ABILIFY  Take 5 mg by mouth every morning.     aspirin EC 81 MG tablet  Take 81 mg by mouth daily.     atorvastatin 80 MG tablet  Commonly known as:  LIPITOR  Take 80 mg by mouth every morning.     BUTRANS 10 MCG/HR Ptwk patch  Generic drug:  buprenorphine  Place 1 patch onto the skin every Monday.     colchicine 0.6 MG tablet  Take 1 tablet (0.6 mg total) by mouth daily as needed (for gout flare-ups).     divalproex 500 MG DR tablet  Commonly known as:  DEPAKOTE  Take 1,000 mg by mouth at bedtime.     gabapentin 300 MG capsule  Commonly known as:  NEURONTIN  300 mg 4 (four) times daily.     gemfibrozil 600 MG tablet  Commonly known as:  LOPID  Take 600 mg by mouth daily.     glucose blood test strip  Use as instructed     insulin aspart 100 UNIT/ML FlexPen  Commonly known as:  NOVOLOG  - For blood sugar 151 - 200 take 4 units   - For blood sugar 201 - 250 take 7 units    - For blood sugar 251 - 300 take 11 units   - For blood sugar 301 - 350 take 15 units   - If blood sugar is above 350, please call clinic for instructions.     insulin glargine 100 unit/mL Sopn  Commonly known as:  LANTUS  Inject 0.52 mLs (52 Units total) into the skin at bedtime.     Insulin Pen Needle 31G X 5 MM Misc  Use as directed.     latanoprost 0.005 % ophthalmic solution  Commonly known as:  XALATAN  Place 1 drop into both eyes every morning.     levothyroxine 100 MCG tablet  Commonly known as:  SYNTHROID, LEVOTHROID  Take 1 tablet (100 mcg total) by mouth daily before breakfast.     lisinopril 40 MG tablet  Commonly known as:  PRINIVIL,ZESTRIL  Take 1 tablet (40 mg total) by mouth daily.     mometasone-formoterol 100-5 MCG/ACT Aero  Commonly known as:  DULERA  Inhale 2 puffs into the lungs 2 (two) times daily.  nitroGLYCERIN 0.4 MG SL tablet  Commonly known as:  NITROSTAT  Place 1 tablet (0.4 mg total) under the tongue every 5 (five) minutes as needed for chest pain.     ondansetron 8 MG disintegrating tablet  Commonly known as:  ZOFRAN ODT  Take 1 tablet (8 mg total) by mouth every 8 (eight) hours as needed for nausea or vomiting.     pantoprazole 40 MG tablet  Commonly known as:  PROTONIX  Take 1 tablet (40 mg total) by mouth daily. For reflux.     sertraline 100 MG tablet  Commonly known as:  ZOLOFT  Take 1 tablet (100 mg total) by mouth daily.     timolol 0.5 % ophthalmic solution  Commonly known as:  BETIMOL  Place 1 drop into both eyes 3 (three) times daily.     traMADol 50 MG tablet  Commonly known as:  ULTRAM  Take 50 mg by mouth every 6 (six) hours as needed for moderate pain.     traZODone 50 MG tablet  Commonly known as:  DESYREL  Take 50 mg by mouth at bedtime.        Discharge Assessment: Filed Vitals:   01/03/14 1544  BP: 125/82  Pulse: 73  Temp: 97.6 F (36.4 C)  Resp: 14   Skin clean, dry and intact without evidence  of skin break down, no evidence of skin tears noted. IV catheter discontinued intact. Site without signs and symptoms of complications - no redness or edema noted at insertion site, patient denies c/o pain - only slight tenderness at site.  Dressing with slight pressure applied.  D/c Instructions-Education: Discharge instructions given to patient/family with verbalized understanding. D/c education completed with patient/family including follow up instructions, medication list, d/c activities limitations if indicated, with other d/c instructions as indicated by MD - patient able to verbalize understanding, all questions fully answered. Patient instructed to return to ED, call 911, or call MD for any changes in condition.  Patient escorted via [place], and D/C home via private auto.  Maria Janish Margaretha Sheffield, RN 01/03/2014 4:11 PM

## 2014-01-03 NOTE — Discharge Instructions (Signed)
·   Thank you for allowing [place] to be involved in your healthcare while you were hospitalized at [facility].   Please note that there have been changes to your home medications.  --> PLEASE LOOK AT YOUR DISCHARGE MEDICATION LIST FOR DETAILS.  For you premeal insulin, please follow the new sliding scale instructions as below:  For blood sugar 151 - 200 take 4 units  For blood sugar 201 - 250 take 7 units  For blood sugar 251 - 300 take 11 units  For blood sugar 301 - 350 take 15 units  If blood sugar is above 350, please call clinic for instructions.  Please check you blood sugar 3-4 times a day and bring your meter to the next appointment in the clinic to see if we can make changes to your insulin regimen   Please call your PCP if you have any questions or concerns, or any difficulty getting any of your medications.  Please return to the ER if you have worsening of your symptoms or new severe symptoms arise.

## 2014-01-04 ENCOUNTER — Encounter: Payer: Self-pay | Admitting: Internal Medicine

## 2014-01-08 ENCOUNTER — Ambulatory Visit (INDEPENDENT_AMBULATORY_CARE_PROVIDER_SITE_OTHER): Payer: PRIVATE HEALTH INSURANCE | Admitting: Dietician

## 2014-01-08 ENCOUNTER — Ambulatory Visit: Payer: Self-pay | Admitting: Internal Medicine

## 2014-01-08 ENCOUNTER — Encounter: Payer: Self-pay | Admitting: Dietician

## 2014-01-08 ENCOUNTER — Encounter: Payer: Self-pay | Admitting: Internal Medicine

## 2014-01-08 ENCOUNTER — Ambulatory Visit (INDEPENDENT_AMBULATORY_CARE_PROVIDER_SITE_OTHER): Payer: PRIVATE HEALTH INSURANCE | Admitting: Internal Medicine

## 2014-01-08 VITALS — BP 111/69 | HR 83 | Temp 97.9°F | Ht 62.0 in | Wt 203.9 lb

## 2014-01-08 DIAGNOSIS — IMO0002 Reserved for concepts with insufficient information to code with codable children: Secondary | ICD-10-CM

## 2014-01-08 DIAGNOSIS — E039 Hypothyroidism, unspecified: Secondary | ICD-10-CM

## 2014-01-08 DIAGNOSIS — M1A00X0 Idiopathic chronic gout, unspecified site, without tophus (tophi): Secondary | ICD-10-CM

## 2014-01-08 DIAGNOSIS — E785 Hyperlipidemia, unspecified: Secondary | ICD-10-CM

## 2014-01-08 DIAGNOSIS — I10 Essential (primary) hypertension: Secondary | ICD-10-CM

## 2014-01-08 DIAGNOSIS — J441 Chronic obstructive pulmonary disease with (acute) exacerbation: Secondary | ICD-10-CM

## 2014-01-08 DIAGNOSIS — F411 Generalized anxiety disorder: Secondary | ICD-10-CM

## 2014-01-08 DIAGNOSIS — M1A9XX0 Chronic gout, unspecified, without tophus (tophi): Secondary | ICD-10-CM

## 2014-01-08 DIAGNOSIS — E1149 Type 2 diabetes mellitus with other diabetic neurological complication: Secondary | ICD-10-CM

## 2014-01-08 DIAGNOSIS — E1165 Type 2 diabetes mellitus with hyperglycemia: Secondary | ICD-10-CM

## 2014-01-08 LAB — GLUCOSE, CAPILLARY: GLUCOSE-CAPILLARY: 300 mg/dL — AB (ref 70–99)

## 2014-01-08 MED ORDER — COLCHICINE 0.6 MG PO TABS: 0.6000 mg | ORAL_TABLET | Freq: Every day | ORAL | Status: DC | PRN

## 2014-01-08 MED ORDER — GLUCOSE BLOOD VI STRP: ORAL_STRIP | Status: AC

## 2014-01-08 MED ORDER — INSULIN PEN NEEDLE 31G X 5 MM MISC: Status: AC

## 2014-01-08 MED ORDER — ALBUTEROL SULFATE (2.5 MG/3ML) 0.083% IN NEBU: 2.5000 mg | INHALATION_SOLUTION | Freq: Four times a day (QID) | RESPIRATORY_TRACT | Status: AC | PRN

## 2014-01-08 MED ORDER — ALPRAZOLAM 0.5 MG PO TABS: 0.5000 mg | ORAL_TABLET | Freq: Three times a day (TID) | ORAL | Status: DC

## 2014-01-08 MED ORDER — ALLOPURINOL 300 MG PO TABS: 300.0000 mg | ORAL_TABLET | Freq: Every day | ORAL | Status: DC

## 2014-01-08 MED ORDER — GEMFIBROZIL 600 MG PO TABS: 600.0000 mg | ORAL_TABLET | Freq: Every day | ORAL | Status: DC

## 2014-01-08 MED ORDER — ALBUTEROL SULFATE HFA 108 (90 BASE) MCG/ACT IN AERS: 2.0000 | INHALATION_SPRAY | Freq: Four times a day (QID) | RESPIRATORY_TRACT | Status: AC | PRN

## 2014-01-08 NOTE — Progress Notes (Signed)
Medical Nutrition Therapy:  Appt start time: 1030 end time:  1130. Last visit 09/2012 Assessment:  Primary concerns today: Blood sugar control.  Patient is here for help with blood sugar control. She reports she forgets to take her insulin at times and is generally has trouble remembering things. She accidentally threw her strips away and asks for more. Recently in the hospital for [place]: unsure of learning readiness Barriers to learning/adherence to lifestyle change: substance abuse Usual eating pattern includes 3 meals and 2 snacks per day. Frequent foods include diet and regular soda, chicken, rice and canned soup, orange juice.  Avoided foods include none metioned  today. 24-hr recall: (Up at 530- 6  AM) B ( 7-8AM)-  2sl. [place] bacon, ~ 1 c oatmeal( sometimes egg and toast instead, coffee Snk plums, baked lays, sometimes a [place] and cheese sandwich    L ( 11 am-12 PM)- can of chicken noodle soup, 8 crackers, grilled cheese, water Snk ( PM)- none D ( PM)- baked spaghetti or baked chicken with rice, corn and greens beans, sometimes sweet potato or pintos or a  "juicey" salad with cheese, croutons,   Snk ( PM)- once a week, small snack lf leftovers from dinner Beverages: diet and regular soda, water, orange juice and sunny delight.   Medication: lantus: reports dose of 54 units last dose was last night,     Novolog: reports taking 8 units three times a day, last dose this am with only coffee for breakfast,  etimated TDD 47-111, current TDD is 78 units, Correction factor is ~ 23, ICR ~ 1:6  PLEASE NOTE: per medicine that patient brought in today Lantus filled on 12/21/13, she had 3 unopened pens and one with 290 cc in it and another with 140 cc in it( 230 units of lantus used from 12/21/13 until today - her hospital admits 5 days, she should have used ~430 units/cc) . Her Novolog pens also were filled 12/21/13 an had 3 unopened pens it it, both pens had little used from them.  Doubt patient was taking insulin doses as prescribed.    Blood sugars: 181at beginning of visit and 212 at the end of our visit after reportedly only having 1/2 cup coffee this am and 8 units Novolog on my meter here in office. Usual physical activity includes not discussed in detail  Today- cooks cleans, watches TV.  Progress Towards Goal(s):  No progress.   Nutritional Diagnosis:  [place]-2.3 Food-medication interaction As related to lack of mathcing insulin to carb intake.  As evidenced by her fluctuating blood sugars.    Intervention:  Nutrition education using healthy meal planning, more vegetables and less sweets and stacrhes. Prunes daily , blood sugar control and vegetables for constipation. training on storage of insulin, not reusing pen needles or keeping them on insulin pens. Handouts given during visit include:  AVS Demonstrated degree of understanding via:  Teach Back   Monitoring/Evaluation:  Dietary intake, exercise, meter, and body weight in 2 week(s).

## 2014-01-08 NOTE — Patient Instructions (Addendum)
It was a pleasure taking care of you today, Ms. Henrene Pastor.  1. Diabetes - Please check your blood sugars before breakfast, lunch, dinner, and bedtime - Take Lantus 54 units at bedtime - Take Novolog 10 units three times a day with meals - Avoid eating sweets (Twinkies, Honeybuns), juices, and regular soda - make half your plate vegetables   General Instructions:   Thank you for bringing your medicines today. This helps [place] keep you safe from mistakes.   Progress Toward Treatment Goals:  Treatment Goal 12/21/2013  Hemoglobin A1C unable to assess  Blood pressure unable to assess  Stop smoking unable to assess  Prevent falls unable to assess    Self Care Goals & Plans:  Self Care Goal 01/08/2014  Manage my medications take my medicines as prescribed; bring my medications to every visit; refill my medications on time  Monitor my health keep track of my blood glucose; bring my glucose meter and log to each visit; check my feet daily  Eat healthy foods eat more vegetables; eat baked foods instead of fried foods; eat foods that are low in salt  Be physically active find an activity I enjoy  Stop smoking set a quit date and stop smoking  Prevent falls wear appropriate shoes; have my vision checked  Meeting treatment goals -    Home Blood Glucose Monitoring 12/17/2013  Check my blood sugar 2 times a day  When to check my blood sugar before breakfast; before meals     Care Management & Community Referrals:  Referral 01/23/2013  Referrals made for care management support diabetes educator  Referrals made to community resources -

## 2014-01-08 NOTE — Patient Instructions (Addendum)
Try to drink a clear soda like Sprite Zero instead of pepsi to help your sleep and your kidneys.   Please NEVER keep a insulin pen needle attached to pen after you use it for an injection.  ALWAYs date your insulin pens and discard them after 28-30 days.   Use one pen at a time that can be kept at room temperature. Then take another pen out of the reif when that one is finished and you have thrown it away.   You can eat about 3 servings of carb (fruit, starch and dairy) at each meal

## 2014-01-08 NOTE — Assessment & Plan Note (Signed)
Hx of poorly controlled DM (last HbA1c 14.8 on 12/07/13) with 3 hospital admissions in the last month for HHS. Blood sugars continue to be in 300-500 range. Pt reports she has been taking Lantus 54 units at bedtime and Novolog 8 units TID with meals. She has not been doing the sliding scale that she was discharged on from her last hospitalization on 01/03/14.  - Referral to Endocrinology made - Continue Lantus 54 units QHS - Start Novolog 10 units TID with meals - Test blood sugars 3-4 times per day, before breakfast, lunch, dinner, and bedtime - Pt educated on improving diet- no sweets (Twinkies, Honeybuns), no regular soda (diet soda ok for now), no juices. Recommended to pt to make half her plate vegetables at every meal  - Pt to record her food intake and each time she takes insulin throughout the day for the next week - Follow-up appointment next week to review progress

## 2014-01-08 NOTE — Assessment & Plan Note (Signed)
BP Readings from Last 3 Encounters:  01/08/14 111/69  01/03/14 125/82  12/30/13 100/68    Lab Results  Component Value Date   NA 141 01/03/2014   K 4.2 01/03/2014   CREATININE 1.10 01/03/2014    Assessment: Blood pressure control: controlled Progress toward BP goal:  improved Comments:   Plan: Medications:  continue current medications Educational resources provided:   Self management tools provided:   Other plans: BP in good control. Continue current regimen.

## 2014-01-08 NOTE — Progress Notes (Signed)
   Subjective:    Patient ID: [name], female    DOB: 09-Apr-1956, 58 y.o.   MRN: [id number]  HPI [name] is a 58yo woman w/ PMHx of HTN, uncontrolled Type 2 DM (HbA1c >14), COPD, bipolar disorder, hx substance abuse (cocaine and benzos), and HLD who presents today for a hospital follow-up.  [name] was hospitalized from the IM clinic on 8/5-8/9 after presenting with HHS. This will be her third admission in 1 month for HHS. She was discharged home on Lantus 52 units at bedtime, Novolog 8 units TID with meals, and a correcting sliding scale: BG 151-200 4 units BG 201-250 7 units BG 251-300 11 units BG 301-350 15 units BG >350 Call clinic for assistance  Pt reports she has actually been taking Lantus 54 units QHS and Novolog 8 units TID. She admits that she has missed doses of her Lantus and sometimes only takes Novolog BID. Her blood glucose today is 300. She reports she ran out of test strips last week and has not been able to test her blood sugar recently. Based on her meter readings, her BGs range from 283-"HI" with the highest sugars in the afternoon and evening. She reports she has been making an effort to eat better by only eating Twinkies 1x/week and drinking diet soda instead of regular soda. She reports blurry vision, dizziness, polydipsia, and polyuria. She denies any cocaine use in the last week. She has been going to [employer] and has a sponsor.    Review of Systems General: Denies fever, chills, night sweats, changes in weight, changes in appetite HEENT: Denies headaches, ear pain, changes in vision, rhinorrhea, sore throat CV: Denies CP, palpitations, SOB, orthopnea Pulm: Denies SOB, cough, wheezing GI: Denies abdominal pain, nausea, vomiting, diarrhea, constipation, melena, hematochezia GU: Denies dysuria, hematuria, frequency Msk: Denies muscle cramps, joint pains Neuro: Denies weakness, numbness, tingling Skin: Denies rashes, bruising    Objective:   Physical  Exam General: appears stated age, sitting up in chair, NAD HEENT: [place]/AT, EOMI, PERRL, sclera anicteric, pharynx non-erythematous, mucus membranes moist Neck: supple, no JVD, no lymphadenopathy CV: RRR, normal S1/S2, no m/g/r Pulm: CTA bilaterally, breaths non-labored, no wheezing Abd: BS+, soft, non-distended, non-tender Ext: warm, no edema, moves all Neuro: alert and oriented x 3, CNs II-XII intact, strength 5/5 in upper and lower extremities bilaterally     Assessment & Plan:

## 2014-01-08 NOTE — Assessment & Plan Note (Signed)
TSH elevated at 93 in June 2015 and 67 on 12/30/13. Synthroid increased to 100 mcg daily during hospitalization from 8/5-8/9. - Continue Synthroid 100 mcg daily - Recheck TSH in 6 weeks (02/19/14)

## 2014-01-10 ENCOUNTER — Encounter: Payer: Self-pay | Admitting: Internal Medicine

## 2014-01-10 NOTE — Progress Notes (Addendum)
Patient ID: [name], female   DOB: 04-25-1956, 58 y.o.   MRN: [id number]  [name] has sent me paperwork which requires my signature:  01/04/14: Authorization for a cane  01/07/14: [place] -Trazodone is listed as 100mg  here though is documented as 50mg  in our chart.   01/07/14: Notification that her PT is delayed

## 2014-01-11 NOTE — Progress Notes (Signed)
INTERNAL MEDICINE TEACHING ATTENDING ADDENDUM - Aldine Contes, MD: I personally saw and evaluated Ms. Ey in this clinic visit in conjunction with the resident, Dr. Arcelia Jew. I have discussed patient's plan of care with medical resident during this visit. I have confirmed the physical exam findings and have read and agree with the clinic note including the plan with the following addition: - Patient with uncontrolled DM s/p recent admission for HHS - Increase novolog to 10 units pre meal - Pt educated on compliance to her diet - To follow up next week to review blood sugars - Referral to endo made for DM management - Pt with elevated TSH. Synthroid increased during recent hospitalization. Recheck TSH on 9/25

## 2014-01-13 ENCOUNTER — Telehealth: Payer: Self-pay | Admitting: Cardiovascular Disease

## 2014-01-13 ENCOUNTER — Encounter: Payer: Self-pay | Admitting: Internal Medicine

## 2014-01-13 NOTE — Telephone Encounter (Signed)
New problem   Pt need letter fax [phone] stating she was in office on 11/12/13 for [facility].

## 2014-01-13 NOTE — Progress Notes (Signed)
Patient ID: [name], female   DOB: [birth year], 58 y.o.   MRN: [id number]  I am completing paperwork today from [place] that requires my signature:  01/12/14: oximetry to assess oxygen need for dyspnea overnight as well as with ambulation

## 2014-01-15 ENCOUNTER — Ambulatory Visit (INDEPENDENT_AMBULATORY_CARE_PROVIDER_SITE_OTHER): Payer: PRIVATE HEALTH INSURANCE | Admitting: Internal Medicine

## 2014-01-15 ENCOUNTER — Encounter: Payer: Self-pay | Admitting: Internal Medicine

## 2014-01-15 ENCOUNTER — Ambulatory Visit (HOSPITAL_COMMUNITY)
Admission: RE | Admit: 2014-01-15 | Discharge: 2014-01-15 | Disposition: A | Discharge status: Home / Self Care | Payer: PRIVATE HEALTH INSURANCE | Source: Ambulatory Visit | Attending: Internal Medicine | Admitting: Internal Medicine

## 2014-01-15 ENCOUNTER — Encounter: Payer: Self-pay | Admitting: *Deleted

## 2014-01-15 VITALS — BP 125/86 | HR 82 | Temp 97.5°F | Ht 62.0 in | Wt 208.0 lb

## 2014-01-15 DIAGNOSIS — T7491XA Unspecified adult maltreatment, confirmed, initial encounter: Secondary | ICD-10-CM

## 2014-01-15 DIAGNOSIS — Y929 Unspecified place or not applicable: Secondary | ICD-10-CM | POA: Insufficient documentation

## 2014-01-15 DIAGNOSIS — S0993XA Unspecified injury of face, initial encounter: Secondary | ICD-10-CM | POA: Insufficient documentation

## 2014-01-15 DIAGNOSIS — F411 Generalized anxiety disorder: Secondary | ICD-10-CM

## 2014-01-15 DIAGNOSIS — IMO0002 Reserved for concepts with insufficient information to code with codable children: Secondary | ICD-10-CM

## 2014-01-15 DIAGNOSIS — M549 Dorsalgia, unspecified: Secondary | ICD-10-CM

## 2014-01-15 DIAGNOSIS — E1149 Type 2 diabetes mellitus with other diabetic neurological complication: Secondary | ICD-10-CM

## 2014-01-15 DIAGNOSIS — I10 Essential (primary) hypertension: Secondary | ICD-10-CM

## 2014-01-15 DIAGNOSIS — G8929 Other chronic pain: Secondary | ICD-10-CM

## 2014-01-15 DIAGNOSIS — S199XXA Unspecified injury of neck, initial encounter: Principal | ICD-10-CM

## 2014-01-15 DIAGNOSIS — X58XXXA Exposure to other specified factors, initial encounter: Secondary | ICD-10-CM | POA: Diagnosis not present

## 2014-01-15 DIAGNOSIS — E1165 Type 2 diabetes mellitus with hyperglycemia: Secondary | ICD-10-CM

## 2014-01-15 LAB — GLUCOSE, CAPILLARY: Glucose-Capillary: 340 mg/dL — ABNORMAL HIGH (ref 70–99)

## 2014-01-15 MED ORDER — ALPRAZOLAM 0.5 MG PO TABS: 0.5000 mg | ORAL_TABLET | Freq: Three times a day (TID) | ORAL | Status: AC
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
## 2014-01-15 NOTE — Telephone Encounter (Signed)
NOTE  FAXED TO  [facility]  PER PT'S  REQUEST .Adonis Housekeeper

## 2014-01-16 DIAGNOSIS — IMO0002 Reserved for concepts with insufficient information to code with codable children: Secondary | ICD-10-CM | POA: Insufficient documentation

## 2014-01-16 NOTE — Assessment & Plan Note (Signed)
BP in good control. - Continue Amlodipine 5 mg daily - Continue Lisinopril 40 mg daily
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
## 2014-01-16 NOTE — Assessment & Plan Note (Addendum)
Pt improving with insulin compliance and eating healthier. Concern that she may not be getting enough insulin with meals since blood sugars still in 300s-500s. - Continue Lantus 54 units at bedtime - Increase to Novolog 15 units TID with meals - Continue sliding scale - Continue eating more vegetables and whole grains - Check referral to endocrinology

## 2014-01-16 NOTE — Assessment & Plan Note (Signed)
Pt complaining of lower back pain.  - Referral to physical therapy - Referral to sports medicine - Referral to new pain clinic
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
## 2014-01-19 ENCOUNTER — Telehealth: Payer: Self-pay | Admitting: *Deleted

## 2014-01-19 NOTE — Telephone Encounter (Signed)
Pt called for results of Xrays taken on 8/21  Results were negative for nasal bones, Pt informed and Dr Arcelia Jew informed.

## 2014-01-20 NOTE — Addendum Note (Signed)
Addended by: Hulan Fray on: 01/20/2014 08:23 PM   Modules accepted: Orders

## 2014-01-22 ENCOUNTER — Encounter: Payer: Self-pay | Admitting: Adult Health

## 2014-01-22 ENCOUNTER — Ambulatory Visit (INDEPENDENT_AMBULATORY_CARE_PROVIDER_SITE_OTHER): Payer: Medicare Other | Admitting: Adult Health

## 2014-01-22 VITALS — BP 128/84 | HR 76 | Ht 63.0 in | Wt 204.0 lb

## 2014-01-22 DIAGNOSIS — G43909 Migraine, unspecified, not intractable, without status migrainosus: Secondary | ICD-10-CM

## 2014-01-22 MED ORDER — TOPIRAMATE 50 MG PO TABS: 50.0000 mg | ORAL_TABLET | Freq: Two times a day (BID) | ORAL | Status: DC

## 2014-01-22 NOTE — Patient Instructions (Signed)

## 2014-01-22 NOTE — Progress Notes (Signed)
I have read the note, and I agree with the clinical assessment and plan.  [name]   
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
## 2014-01-26 ENCOUNTER — Other Ambulatory Visit: Payer: Self-pay | Admitting: *Deleted

## 2014-01-27 ENCOUNTER — Encounter: Payer: Self-pay | Admitting: Internal Medicine

## 2014-01-27 MED ORDER — ATORVASTATIN CALCIUM 80 MG PO TABS: 80.0000 mg | ORAL_TABLET | Freq: Every morning | ORAL | Status: AC

## 2014-01-27 NOTE — Telephone Encounter (Signed)
As I saw this patient recently and feel it's necessary for the treatment of their disease, I will refill this prescription for Lipitor 80mg . I discontinued colchicine as she has been on it for 2013 which is greater than the time window indicated for prophylaxis (3-6 months).

## 2014-01-27 NOTE — Progress Notes (Signed)
Patient ID: [name], female   DOB: 1956/03/21, 58 y.o.   MRN: [id number]  I am completing paperwork today from [place] that requires my signature:  01/13/14: [facility] RN order for skilled observation, checking vitals, instruction regarding medication and diet, ulcer prevention, education about diabetic foot care, fall prevention

## 2014-02-02 ENCOUNTER — Ambulatory Visit: Payer: PRIVATE HEALTH INSURANCE | Attending: Internal Medicine | Admitting: Physical Therapy

## 2014-02-02 ENCOUNTER — Telehealth: Payer: Self-pay | Admitting: *Deleted

## 2014-02-02 NOTE — Telephone Encounter (Signed)
Perhaps. I was backed up last month coming off the inpatient service which further delayed me from signing off. I will try to check back later today.

## 2014-02-02 NOTE — Telephone Encounter (Signed)
Call from [name], [place] with [facility] - # [phone] Nurse is requesting an order to see pt for additional 60 days.   She is working on Medication Compliance and safety.    Pt continues to run elevated CBG levels.  She questions increasing medication.    I read your note about completing paperwork for [facility] nursing.  They might not have received it yet??

## 2014-02-02 NOTE — Addendum Note (Signed)
Addended by: Hulan Fray on: 02/02/2014 03:20 PM   Modules accepted: Orders

## 2014-02-03 NOTE — Telephone Encounter (Signed)
Can I give the verbal order for 60 more days of home health?

## 2014-02-04 NOTE — Telephone Encounter (Signed)
VO given for recert of HH Paperwork has not been recieved

## 2014-02-04 NOTE — Telephone Encounter (Signed)
Thank you following up.

## 2014-02-06 ENCOUNTER — Other Ambulatory Visit: Payer: Self-pay | Admitting: Internal Medicine

## 2014-02-08 ENCOUNTER — Encounter: Payer: Self-pay | Admitting: Internal Medicine

## 2014-02-08 ENCOUNTER — Ambulatory Visit (INDEPENDENT_AMBULATORY_CARE_PROVIDER_SITE_OTHER): Payer: PRIVATE HEALTH INSURANCE | Admitting: Internal Medicine

## 2014-02-08 VITALS — BP 124/57 | HR 70 | Temp 97.6°F | Ht 62.0 in | Wt 203.0 lb

## 2014-02-08 DIAGNOSIS — E1169 Type 2 diabetes mellitus with other specified complication: Secondary | ICD-10-CM

## 2014-02-08 DIAGNOSIS — J449 Chronic obstructive pulmonary disease, unspecified: Secondary | ICD-10-CM

## 2014-02-08 DIAGNOSIS — E1165 Type 2 diabetes mellitus with hyperglycemia: Secondary | ICD-10-CM

## 2014-02-08 DIAGNOSIS — J441 Chronic obstructive pulmonary disease with (acute) exacerbation: Secondary | ICD-10-CM

## 2014-02-08 DIAGNOSIS — E1149 Type 2 diabetes mellitus with other diabetic neurological complication: Secondary | ICD-10-CM

## 2014-02-08 DIAGNOSIS — F17200 Nicotine dependence, unspecified, uncomplicated: Secondary | ICD-10-CM

## 2014-02-08 DIAGNOSIS — IMO0002 Reserved for concepts with insufficient information to code with codable children: Secondary | ICD-10-CM

## 2014-02-08 DIAGNOSIS — R29818 Other symptoms and signs involving the nervous system: Secondary | ICD-10-CM

## 2014-02-08 DIAGNOSIS — J4489 Other specified chronic obstructive pulmonary disease: Secondary | ICD-10-CM

## 2014-02-08 LAB — GLUCOSE, CAPILLARY: GLUCOSE-CAPILLARY: 235 mg/dL — AB (ref 70–99)

## 2014-02-08 MED ORDER — GABAPENTIN 300 MG PO CAPS: 300.0000 mg | ORAL_CAPSULE | Freq: Three times a day (TID) | ORAL | Status: DC

## 2014-02-08 MED ORDER — SULFAMETHOXAZOLE-TMP DS 800-160 MG PO TABS: 1.0000 | ORAL_TABLET | Freq: Two times a day (BID) | ORAL | Status: DC

## 2014-02-08 MED ORDER — PREDNISONE 20 MG PO TABS: 40.0000 mg | ORAL_TABLET | Freq: Every day | ORAL | Status: DC

## 2014-02-08 NOTE — Assessment & Plan Note (Addendum)
Pt likley having an exacerbation- and meets criteria for moderate Exacebation- Increased sputum purulence, increased cough and SOB- Mild.  No recorded fevers- temp here- 97.6, sat- 99% on RA, no wheezing or Crackles on Exam. No admissions this past year for COPD. Also may be aggravated by Viral URI- Hx of contact with sick persons, myalgias, no Flu shot yet. No chest pain or palpitations, no leg swelling.  Plan- Chest xray - CBC - Prednisone- 40mg  daily for 5 days - Bactrim- Ds- 1 tab BID for 5 days. - Flu shot next visit, when acute illness resolves. - Pt advised to call and make appoitment if she feels worse or symptoms do not resolve in a week.

## 2014-02-08 NOTE — Assessment & Plan Note (Signed)
  Assessment: Progress toward smoking cessation:   progressing, down to 2-3 cgs a day from 1ppd. Barriers to progress toward smoking cessation:   Family issues, stress. Comments: Pt says she has quit in the past( a few months ago) for 1 week only, and then restarted due to stress.   Plan: Instruction/counseling given:  I counseled patient on the dangers of tobacco use, advised patient to stop smoking, and reviewed strategies to maximize success. Educational resources provided: Yes   Other plans: reacess next visit if ready to quit.

## 2014-02-08 NOTE — Patient Instructions (Addendum)
General Instructions: I will be prescribing  2 New medications for you today which you will be taking for 5 days. The first is an antibiotic take one tablet 2ce a day for 5 days. The second is called prednisone- Take 2 tablets once a day for 5 days. Call and make an appointment in 1 week if you do not feel better, other wise come back in a month for follow up and to get a flu shot.  Also get the chest xray do and some lab work also.    Please bring your medicines with you each time you come to clinic.  Medicines may include prescription medications, over-the-counter medications, herbal remedies, eye drops, vitamins, or other pills.

## 2014-02-08 NOTE — Progress Notes (Signed)
Patient ID: [name], female   DOB: [birth year], 58 y.o.   MRN: [id number]   Subjective:   Patient ID: [name] female   DOB: 04-10-1956 58 y.o.   MRN: [id number]  HPI: [name] is a 58 y.o. with PMH listed below. Came in today with cough of about 4 days duration, productive of greenish sputum, progressively getting worse since it started, also pt reports body aches and congestion also around this period. Pt endorses chills, and mild fever but didn't check her temp. Pt has not tried any thing for this. Mild SOB, present at rest and with exertion- 3 days duration. Pts grand daughter also with similar symptoms, visted 2 weeks ago and then 4 days ago, she was having body aches and cough. Pt has not gotten the flu shot. Smokes- cigs- 2-3 cigs a day for the past year, previosly a heavier smoker- 1 ppd, started smoking when she was 14 years. Has tried to quit, tried for a week, stress and family issues made her start smoking again. Also still using Cocaine- last use 2 days ago.   Past Medical History  Diagnosis Date  . Glaucoma     dx 06/2011-[facility]  . Hypertension   . Hyperlipidemia   . History of TIA (transient ischemic attack) 12/2010-01/2011 X 2  . Pancreatitis   . Gout   . Depression   . Chronic pain   . Bipolar disorder   . Gastritis   . COPD (chronic obstructive pulmonary disease)   . Schizophrenia   . 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 Fatty liver     mild CT 06/2012  . Stroke     TIAs  . Esophageal ring, acquired 08/12/2013  . Cocaine abuse   . Chronic abdominal pain   . Asthma   . Hypothyroidism   . Type II diabetes mellitus dx'd ` 2009  . DKA (diabetic ketoacidoses) 07/21/2013  . Arthritis     "legs, lower back, left knee" (12/30/2013)  . Degenerative disc disease   . Chronic lower back pain   . Gout   . Anxiety   . CKD (chronic kidney disease), stage III    Current Outpatient Prescriptions  Medication Sig Dispense Refill  .  albuterol (PROVENTIL HFA;VENTOLIN HFA) 108 (90 BASE) MCG/ACT inhaler Inhale 2 puffs into the lungs every 6 (six) hours as needed for wheezing.  1 Inhaler  2  . albuterol (PROVENTIL) (2.5 MG/3ML) 0.083% nebulizer solution Inhale 3 mLs (2.5 mg total) into the lungs every 6 (six) hours as needed for wheezing.  75 mL  3  . allopurinol (ZYLOPRIM) 300 MG tablet Take 1 tablet (300 mg total) by mouth daily. For gout.  30 tablet  3  . ALPRAZolam (XANAX) 0.5 MG tablet Take 1 tablet (0.5 mg total) by mouth 3 (three) times daily.  30 tablet  0  . amLODipine (NORVASC) 5 MG tablet Take 1 tablet (5 mg total) by mouth daily. For hypertension.  30 tablet  3  . ARIPiprazole (ABILIFY) 5 MG tablet Take 5 mg by mouth every morning.      Marland Kitchen aspirin EC 81 MG tablet Take 81 mg by mouth daily.      Marland Kitchen atorvastatin (LIPITOR) 80 MG tablet Take 1 tablet (80 mg total) by mouth every morning.  30 tablet  6  . divalproex (DEPAKOTE) 500 MG DR tablet Take 500 mg by mouth at bedtime. Taking 2 tablets at bedtime      .  gabapentin (NEURONTIN) 300 MG capsule 300 mg 4 (four) times daily. Taking 300 mg three times a day and two 300 mg tablets at  night      . gemfibrozil (LOPID) 600 MG tablet Take 1 tablet (600 mg total) by mouth daily.  30 tablet  3  . glucose blood test strip Use as instructed  100 each  12  . insulin aspart (NOVOLOG) 100 UNIT/ML FlexPen For blood sugar 151 - 200 take 4 units  For blood sugar 201 - 250 take 7 units  For blood sugar 251 - 300 take 11 units  For blood sugar 301 - 350 take 15 units  If blood sugar is above 350, please call clinic for instructions.  15 mL  11  . insulin glargine (LANTUS) 100 unit/mL SOPN Inject 0.52 mLs (52 Units total) into the skin at bedtime.  15 mL  11  . Insulin Pen Needle 31G X 5 MM MISC Use as directed.  100 each  3  . latanoprost (XALATAN) 0.005 % ophthalmic solution Place 1 drop into both eyes every morning.       Marland Kitchen levothyroxine (SYNTHROID, LEVOTHROID) 100 MCG tablet Take 1  tablet (100 mcg total) by mouth daily before breakfast.  30 tablet  3  . lisinopril (PRINIVIL,ZESTRIL) 40 MG tablet Take 1 tablet (40 mg total) by mouth daily.  30 tablet  3  . mometasone-formoterol (DULERA) 100-5 MCG/ACT AERO Inhale 2 puffs into the lungs 2 (two) times daily.  1 Inhaler    . nitroGLYCERIN (NITROSTAT) 0.4 MG SL tablet Place 1 tablet (0.4 mg total) under the tongue every 5 (five) minutes as needed for chest pain.  30 tablet  3  . ondansetron (ZOFRAN ODT) 8 MG disintegrating tablet Take 1 tablet (8 mg total) by mouth every 8 (eight) hours as needed for nausea or vomiting.  12 tablet  0  . pantoprazole (PROTONIX) 40 MG tablet Take 1 tablet (40 mg total) by mouth daily. For reflux.  30 tablet  11  . sertraline (ZOLOFT) 100 MG tablet Take 1 tablet (100 mg total) by mouth daily.  30 tablet  0  . timolol (BETIMOL) 0.5 % ophthalmic solution Place 1 drop into both eyes 3 (three) times daily.      Marland Kitchen topiramate (TOPAMAX) 50 MG tablet Take 1 tablet (50 mg total) by mouth 2 (two) times daily.  60 tablet  3  . traMADol (ULTRAM) 50 MG tablet Take 50 mg by mouth every 6 (six) hours as needed for moderate pain.      . traZODone (DESYREL) 50 MG tablet Take 50 mg by mouth at bedtime.       No current facility-administered medications for this visit.   Family History  Problem Relation Age of Onset  . Arthritis Mother   . Diabetes Mother   . Hypertension Mother   . Kidney disease Mother   . Heart disease Mother     died 55  . Heart disease      mother & father  . Hypertension      mother & father  . Colon cancer Neg Hx   . Prostate cancer Neg Hx   . Breast cancer Neg Hx   . Stomach cancer Neg Hx   . Esophageal cancer Neg Hx   . Migraines Neg Hx   . Hypertension Sister   . Hypertension Brother   . Kidney disease Brother    History   Social History  . Marital Status: Single  Spouse Name: N/A    Number of Children: 3  . Years of Education: college   Occupational History  .  Disability    Social History Main Topics  . Smoking status: Current Every Day Smoker -- 0.25 packs/day for 40 years    Types: Cigarettes  . Smokeless tobacco: Never Used     Comment: smokes 2 -3 cigs daily.  Cutting back  . Alcohol Use: No     Comment: 12/30/2013 "haven't used alcohol since 1980's"  . Drug Use: No     Comment: 12/30/2013 "stopped using drugs ~ 11/2013"  . Sexual Activity: None     Comment: hysterectomy   Other Topics Concern  . None   Social History Narrative   On disability   Single 3 sons   3 coffee/day   3 regular pepsi/day   Updated 08/11/2013                  Review of Systems: CONSTITUTIONAL- No weightloss, night sweat or change in appetite. SKIN- No Rash, colour changes or itching. HEAD- No Headache or dizziness. CARDIAC- No Palpitations, DOE, PND or chest pain. GI- No nausea, vomiting, diarrhoea, constipation, abd pain. URINARY- No Frequency, urgency, straining or dysuria. NEUROLOGIC- No Numbness, syncope, seizures or burning. [facility]- Denies depression or anxiety.  Objective:  Physical Exam: Filed Vitals:   02/08/14 0852  BP: 124/57  Pulse: 70  Temp: 97.6 F (36.4 C)  TempSrc: Oral  Height: 5\' 2"  (1.575 m)  Weight: 203 lb (92.08 kg)  SpO2: 99%   GENERAL- alert, co-operative, appears as stated age, not in any distress. HEENT- Atraumatic, normocephalic, PERRL, EOMI, oral mucosa appears moist, neck supple. CARDIAC- RRR, no murmurs, rubs or gallops. RESP- Moving equal volumes of air, dose not appear short of breath, normal work of breathing, clear to auscultation bilaterally, no wheezes or crackles, but then not moving a lot of air- as breath sounds not very audible despite good effort, likely from obesity also. ABDOMEN- Soft, nontender, no guarding or rebound, no palpable masses or organomegaly, bowel sounds present. BACK- Normal curvature of the spine, No tenderness along the vertebrae, no CVA tenderness. NEURO- No obvious Cr N abnormality,  strenght upper and lower extremities-  intact, Gait- Normal. EXTREMITIES- pulse 2+, symmetric, no pedal edema. SKIN- Warm, dry, No rash or lesion. PSYCH- Normal mood and affect, appropriate thought content and speech.  Assessment & Plan:  The patient's case and plan of care was discussed with attending physician, Dr. Daryll Drown.  Please see problem based charting for assessment and plan.

## 2014-02-08 NOTE — Telephone Encounter (Signed)
As this patient was recently seen in clinic, and I feel it's necessary for the treatment of their disease, I will refill this prescription for lisinopril 40mg  for 90 days.

## 2014-02-08 NOTE — Assessment & Plan Note (Signed)
No Glucometer today, encouraged to be more discrete with her diet. Consider restarting metformin at a very low dose- 250mg  daily to start, considering high doses of insulin and still uncontrolled blood sugars. Pt currently on SSI and Lantus- 52 u daily, which she says she has been complaint with. Likely poor control related to dietary indiscretion.

## 2014-02-09 ENCOUNTER — Encounter: Payer: Self-pay | Admitting: Internal Medicine

## 2014-02-09 DIAGNOSIS — M549 Dorsalgia, unspecified: Principal | ICD-10-CM

## 2014-02-09 DIAGNOSIS — G8929 Other chronic pain: Secondary | ICD-10-CM

## 2014-02-09 NOTE — Progress Notes (Signed)
Patient ID: [name], female   DOB: 12/02/1955, 58 y.o.   MRN: [id number]  On [place] DEA database, it appears she filled two prescriptions for Xanax from our clinic dated 8/21 & 8/23.  Dr. Arcelia Jew saw her on 8/21 and did give her a prescription for 10 days, but I don't see how she could have gotten a prescription on 8/23 as it was Sunday.   Butrans patches were prescribed by Dr. Ledell Peoples who was a pain medicine fellow at [facility] last year. I don't think she has any current refills.

## 2014-02-09 NOTE — Progress Notes (Signed)
Called pharmacy:  Meds not on there list: Colchicine,Tapentadol, Formoterol, Zofran , ASA.  Meds by Dr Alphonzo Grieve - Zoloft 100 mg 1 daily , Depakote 500 mg 2 @ HS, and Trazodone 100 mg at HS   Meds by our clinic  -  Tramadol 50 mg q 6 hr prn, ( chikowski ), Lopid 600 mg daily, ( Rivet ), xanax 0.5mg  tid  ( Rivet )

## 2014-02-09 NOTE — Progress Notes (Signed)
There are inaccuracies in the [place] controlled substances database. So whenever something doesn't make sense or before I discuss contract violations with a pt, I call the pharmacy to verify the info. The 8/23 Xanax was written on the 23rd using the residents' DEA # (but didn't use EPIC to generate the script) but filled 9/4. So this is one where I would call the pharmacy.

## 2014-02-09 NOTE — Progress Notes (Signed)
Patient ID: [name], female   DOB: 12/16/1955, 58 y.o.   MRN: [id number]  I am completing paperwork today from [place] that requires my signature:  Received 02/04/14: Several adverse effects of medications were identified.  ASA 81mg  & Zoloft 100mg  Butrans 10 mcg/hr patch & Xanax 0.5mg  Butrans 10 mcg/hr patch & tramadol 50mg  Depakote 500mg  Gemfibrozil 600mg  & colchicine Zofran 8mg  & formoterol  Tramadol 50mg  & tapentadol Trazodone 100mg  & tricyclics  RN order for fall prevention, DM management, pain control.  I attempted to reach her psychiatrist's nurse, Hinton Dyer, and left a message to have her return my call to discuss as some of these medications are not on our MAR.  I called her pharmacy but was unable to reach them as well.

## 2014-02-10 ENCOUNTER — Encounter: Payer: Self-pay | Admitting: Internal Medicine

## 2014-02-10 NOTE — Progress Notes (Signed)
Internal Medicine Clinic Attending Date of visit: 02/08/2014   Case discussed with Dr. Denton Brick soon after the resident saw the patient.  We reviewed the resident's history and exam and pertinent patient test results.  I agree with the assessment, diagnosis, and plan of care documented in the resident's note.

## 2014-02-10 NOTE — Progress Notes (Signed)
Patient ID: [name], female   DOB: 03-Jan-1956, 58 y.o.   MRN: [id number]  I called [facility] [place] and spoke with the pharmacist.  On 8/23, she attempted to fill her Xanax prescription given to her by Dr. Arcelia Jew on 8/14. As she had filled one two days prior, she was not given the medication. She returned on 9/4 and filled it at that point.  I will need to review the medications she has filled here as she does not have this listed in the system.

## 2014-02-11 NOTE — Progress Notes (Deleted)
Opened in error

## 2014-02-11 NOTE — Progress Notes (Signed)
Patient ID: [name], female   DOB: Jan 16, 1956, 58 y.o.   MRN: [id number]  I am completing paperwork today from [place] that requires my signature:  Received 02/10/14: This paperwork appears similar in nature to the one I authorized initially on 9/15 and cannot reasonably discern any differences. I will go ahead and authorize the new orders.

## 2014-02-15 ENCOUNTER — Other Ambulatory Visit: Payer: Self-pay | Admitting: *Deleted

## 2014-02-15 DIAGNOSIS — F411 Generalized anxiety disorder: Secondary | ICD-10-CM

## 2014-02-16 ENCOUNTER — Ambulatory Visit: Payer: Self-pay | Admitting: Endocrinology

## 2014-02-22 NOTE — Telephone Encounter (Signed)
I'm concerned about polypharmacy in light of the documentation I received from [facility] along with her filling medications at a pharmacy unknown to [place]. Before I refill these medications, I need to know what she is filling at this other pharmacy.

## 2014-02-23 ENCOUNTER — Ambulatory Visit: Payer: Self-pay | Admitting: Endocrinology

## 2014-02-24 NOTE — Telephone Encounter (Signed)
[facility]- tramadol #20 11/19/2013 and alprazolam 0.5mg  #60 11/26/2013   [facility] 0.5mg  #90 on 01/29/2014           butrans patches 10mg  #4 on 12/26/2013           Tramadol 50mg  11/2013

## 2014-02-24 NOTE — Addendum Note (Signed)
Addended by: Truddie Crumble on: 02/24/2014 04:19 PM   Modules accepted: Orders

## 2014-02-25 MED ORDER — TRAMADOL HCL 50 MG PO TABS: 50.0000 mg | ORAL_TABLET | Freq: Four times a day (QID) | ORAL | Status: DC | PRN

## 2014-02-25 NOTE — Telephone Encounter (Addendum)
Given that she has a psychiatrist, I will defer Xanax refills to them. She has had a history of filling multiple benzo prescriptions over the last several months as I documented on 12/01/2013.   Tramadol is the only thing we filled for her back on 12/25/13, so I will fill for 30 tablets.

## 2014-02-25 NOTE — Addendum Note (Signed)
Addended by: Riccardo Dubin on: 02/25/2014 04:45 PM   Modules accepted: Orders

## 2014-02-26 ENCOUNTER — Encounter: Payer: Self-pay | Admitting: *Deleted

## 2014-02-26 ENCOUNTER — Encounter: Payer: Self-pay | Admitting: Internal Medicine

## 2014-02-26 NOTE — Progress Notes (Signed)
Patient ID: [name], female   DOB: 1956-01-30, 58 y.o.   MRN: [id number]  I am completing paperwork today from [place] that requires my signature:  Received 02/23/2014: Possible medical non-adherence to statin therapy given that she has received 30-day supply on following dates.  10/22/2013 12/04/2013 01/25/2014  I will address at next visit and set up a reminder in my inbasket to that effect.

## 2014-02-26 NOTE — Telephone Encounter (Signed)
Called to pharm 

## 2014-03-02 ENCOUNTER — Ambulatory Visit (INDEPENDENT_AMBULATORY_CARE_PROVIDER_SITE_OTHER): Payer: PRIVATE HEALTH INSURANCE | Admitting: Endocrinology

## 2014-03-02 ENCOUNTER — Encounter: Payer: Self-pay | Admitting: Endocrinology

## 2014-03-02 ENCOUNTER — Other Ambulatory Visit: Payer: Self-pay

## 2014-03-02 VITALS — BP 118/60 | HR 72 | Temp 97.9°F | Ht 62.0 in | Wt 205.0 lb

## 2014-03-02 DIAGNOSIS — E1141 Type 2 diabetes mellitus with diabetic mononeuropathy: Secondary | ICD-10-CM

## 2014-03-02 DIAGNOSIS — IMO0002 Reserved for concepts with insufficient information to code with codable children: Secondary | ICD-10-CM

## 2014-03-02 DIAGNOSIS — E1149 Type 2 diabetes mellitus with other diabetic neurological complication: Secondary | ICD-10-CM

## 2014-03-02 DIAGNOSIS — E1165 Type 2 diabetes mellitus with hyperglycemia: Principal | ICD-10-CM

## 2014-03-02 LAB — HEMOGLOBIN A1C: Hgb A1c MFr Bld: 13.1 % — ABNORMAL HIGH (ref 4.6–6.5)

## 2014-03-02 MED ORDER — GLUCOSE BLOOD VI STRP: 1.0000 | ORAL_STRIP | Freq: Two times a day (BID) | Status: DC

## 2014-03-02 MED ORDER — GLUCOSE BLOOD VI STRP: 1.0000 | ORAL_STRIP | Freq: Every day | Status: AC

## 2014-03-02 MED ORDER — INSULIN ASPART 100 UNIT/ML FLEXPEN: 20.0000 [IU] | PEN_INJECTOR | Freq: Three times a day (TID) | SUBCUTANEOUS | Status: AC

## 2014-03-02 NOTE — Progress Notes (Signed)
Subjective:    Patient ID: [name], female    DOB: [birth year], 58 y.o.   MRN: [id number]  HPI pt states DM was dx'ed in 2000; he has severe painful neuropathy of the lower extremities; she is unaware of any associated chronic complications; he has been on insulin since2012; pt says her diet and exercise are not good; she had pancreatitis in 2013; last episode of severe hypoglycemia was in early 2015; she has never had DKA, but she has had non ketotic hyperosmolar hyperglycemia.  She says she is doing much better remembering her insulin now.  She averages a total of approx 30-70 units of novolog, total per day.  She says cbg's vary from 128-500.  It is in general higher as the day goes on.  Past Medical History  Diagnosis Date  . Glaucoma     dx 06/2011-[facility]  . Hypertension   . Hyperlipidemia   . History of TIA (transient ischemic attack) 12/2010-01/2011 X 2  . Pancreatitis   . Gout   . Depression   . Chronic pain   . Bipolar disorder   . Gastritis   . COPD (chronic obstructive pulmonary disease)   . Schizophrenia   . 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 Fatty liver     mild CT 06/2012  . Stroke     TIAs  . Esophageal ring, acquired 08/12/2013  . Cocaine abuse   . Chronic abdominal pain   . Asthma   . Hypothyroidism   . Type II diabetes mellitus dx'd ` 2009  . DKA (diabetic ketoacidoses) 07/21/2013  . Arthritis     "legs, lower back, left knee" (12/30/2013)  . Degenerative disc disease   . Chronic lower back pain   . Gout   . Anxiety   . CKD (chronic kidney disease), stage III     Past Surgical History  Procedure Laterality Date  . Colonoscopy    . Refractive surgery Bilateral 6//2015  . Abdominal hysterectomy  1990    still has ovaries   . Esophagogastroduodenoscopy (egd) with esophageal dilation  2015    History   Social History  . Marital Status: Single    Spouse Name: N/A    Number of Children: 3  . Years of Education: college    Occupational History  . Disability    Social History Main Topics  . Smoking status: Current Every Day Smoker -- 0.25 packs/day for 40 years    Types: Cigarettes  . Smokeless tobacco: Never Used     Comment: smokes 2 -3 cigs daily.  Cutting back  . Alcohol Use: No     Comment: 12/30/2013 "haven't used alcohol since 1980's"  . Drug Use: No     Comment: 12/30/2013 "stopped using drugs ~ 11/2013"  . Sexual Activity: Not on file     Comment: hysterectomy   Other Topics Concern  . Not on file   Social History Narrative   On disability   Single 3 sons   3 coffee/day   3 regular pepsi/day   Updated 08/11/2013                   Current Outpatient Prescriptions on File Prior to Visit  Medication Sig Dispense Refill  . albuterol (PROVENTIL HFA;VENTOLIN HFA) 108 (90 BASE) MCG/ACT inhaler Inhale 2 puffs into the lungs every 6 (six) hours as needed for wheezing.  1 Inhaler  2  . albuterol (PROVENTIL) (2.5 MG/3ML) 0.083% nebulizer solution Inhale  3 mLs (2.5 mg total) into the lungs every 6 (six) hours as needed for wheezing.  75 mL  3  . allopurinol (ZYLOPRIM) 300 MG tablet Take 1 tablet (300 mg total) by mouth daily. For gout.  30 tablet  3  . ALPRAZolam (XANAX) 0.5 MG tablet Take 1 tablet (0.5 mg total) by mouth 3 (three) times daily.  30 tablet  0  . amLODipine (NORVASC) 5 MG tablet Take 1 tablet (5 mg total) by mouth daily. For hypertension.  30 tablet  3  . ARIPiprazole (ABILIFY) 5 MG tablet Take 5 mg by mouth every morning.      Marland Kitchen aspirin EC 81 MG tablet Take 81 mg by mouth daily.      Marland Kitchen atorvastatin (LIPITOR) 80 MG tablet Take 1 tablet (80 mg total) by mouth every morning.  30 tablet  6  . glucose blood test strip Use as instructed  100 each  12  . Insulin Pen Needle 31G X 5 MM MISC Use as directed.  100 each  3  . latanoprost (XALATAN) 0.005 % ophthalmic solution Place 1 drop into both eyes every morning.       Marland Kitchen levothyroxine (SYNTHROID, LEVOTHROID) 100 MCG tablet Take 1 tablet  (100 mcg total) by mouth daily before breakfast.  30 tablet  3  . lisinopril (PRINIVIL,ZESTRIL) 40 MG tablet TAKE ONE TABLET BY MOUTH EVERY DAY  90 tablet  0  . nitroGLYCERIN (NITROSTAT) 0.4 MG SL tablet Place 1 tablet (0.4 mg total) under the tongue every 5 (five) minutes as needed for chest pain.  30 tablet  3  . pantoprazole (PROTONIX) 40 MG tablet Take 1 tablet (40 mg total) by mouth daily. For reflux.  30 tablet  11  . timolol (BETIMOL) 0.5 % ophthalmic solution Place 1 drop into both eyes 3 (three) times daily.      . traMADol (ULTRAM) 50 MG tablet Take 1 tablet (50 mg total) by mouth every 6 (six) hours as needed for moderate pain.  30 tablet  0  . traZODone (DESYREL) 50 MG tablet Take 100 mg by mouth at bedtime.       . mometasone-formoterol (DULERA) 100-5 MCG/ACT AERO Inhale 2 puffs into the lungs 2 (two) times daily.  1 Inhaler    . ondansetron (ZOFRAN ODT) 8 MG disintegrating tablet Take 1 tablet (8 mg total) by mouth every 8 (eight) hours as needed for nausea or vomiting.  12 tablet  0   No current facility-administered medications on file prior to visit.    Allergies  Allergen Reactions  . Metformin Diarrhea  . Acetaminophen Nausea And Vomiting  . Hydrocodone-Acetaminophen Hives    Family History  Problem Relation Age of Onset  . Arthritis Mother   . Diabetes Mother   . Hypertension Mother   . Kidney disease Mother   . Heart disease Mother     died 32  . Heart disease      mother & father  . Hypertension      mother & father  . Colon cancer Neg Hx   . Prostate cancer Neg Hx   . Breast cancer Neg Hx   . Stomach cancer Neg Hx   . Esophageal cancer Neg Hx   . Migraines Neg Hx   . Hypertension Sister   . Hypertension Brother   . Kidney disease Brother     BP 118/60  Pulse 72  Temp(Src) 97.9 F (36.6 C) (Oral)  Ht 5\' 2"  (1.575 m)  Wt 205 lb (92.987 kg)  BMI 37.49 kg/m2  SpO2 95%  Review of Systems denies chest pain, n/v, excessive diaphoresis, and  rhinorrhea.  She has chronically blurry vision.  She has weight gain, polyuria, leg cramps, depression, cold intolerance, easy bruising, and headache.  She has chronic doe.      Objective:   Physical Exam VS: see vs page GEN: no distress HEAD: head: no deformity eyes: no periorbital swelling, no proptosis external nose and ears are normal mouth: no lesion seen NECK: supple, thyroid is not enlarged CHEST WALL: no deformity.  LUNGS:  Clear to auscultation. CV: reg rate and rhythm, no murmur. ABD: abdomen is soft, nontender.  no hepatosplenomegaly.  not distended.  no hernia.   MUSCULOSKELETAL: muscle bulk and strength are grossly normal.  no obvious joint swelling.  gait is normal and steady EXTEMITIES: no deformity.  no ulcer on the feet.  feet are of normal color and temp.  no edema.  There is bilateral onychomycosis.   PULSES: dorsalis pedis intact bilat.  no carotid bruit NEURO:  cn 2-12 grossly intact.   readily moves all 4's.  sensation is intact to touch on the feet SKIN:  Normal texture and temperature.  No rash or suspicious lesion is visible.   NODES:  None palpable at the neck PSYCH: alert, well-oriented.  Does not appear anxious nor depressed.   Lab Results  Component Value Date   HGBA1C 13.1* 03/02/2014   i reviewed electrocardiogram: 01/03/14  i have reviewed the following outside records: Office notes    Assessment & Plan:  DM: new to me.  Very poor control   Patient is advised the following: Patient Instructions  good diet and exercise habits significanly improve the control of your diabetes.  please let me know if you wish to be referred to a dietician.  high blood sugar is very risky to your health.  you should see an eye doctor and dentist every year.  It is very important to get all recommended vaccinations.  controlling your blood pressure and cholesterol drastically reduces the damage diabetes does to your body.  this also applies to quitting smoking.  please  discuss these with your doctor.  check your blood sugar twice a day.  vary the time of day when you check, between before the 3 meals, and at bedtime.  also check if you have symptoms of your blood sugar being too high or too low.  please keep a record of the readings and bring it to your next appointment here.  You can write it on any piece of paper.  please call us sooner if your blood sugar goes below 70, or if you have a lot of readings over 200.   blood tests are being requested for you today.  We'll contact you with results. For now, please reduce the lantus to 40 units at bedtime, and:  Increase the novolog to 30 units 3 times a day (just before each meal). This is no matter what your blood sugar is.   Please come back for a follow-up appointment in 2 weeks.

## 2014-03-02 NOTE — Patient Instructions (Addendum)
good diet and exercise habits significanly improve the control of your diabetes.  please let me know if you wish to be referred to a dietician.  high blood sugar is very risky to your health.  you should see an eye doctor and dentist every year.  It is very important to get all recommended vaccinations.  controlling your blood pressure and cholesterol drastically reduces the damage diabetes does to your body.  this also applies to quitting smoking.  please discuss these with your doctor.  check your blood sugar twice a day.  vary the time of day when you check, between before the 3 meals, and at bedtime.  also check if you have symptoms of your blood sugar being too high or too low.  please keep a record of the readings and bring it to your next appointment here.  You can write it on any piece of paper.  please call us sooner if your blood sugar goes below 70, or if you have a lot of readings over 200.   blood tests are being requested for you today.  We'll contact you with results. For now, please reduce the lantus to 40 units at bedtime, and:  Increase the novolog to 30 units 3 times a day (just before each meal). This is no matter what your blood sugar is.   Please come back for a follow-up appointment in 2 weeks.

## 2014-03-03 ENCOUNTER — Ambulatory Visit (INDEPENDENT_AMBULATORY_CARE_PROVIDER_SITE_OTHER): Payer: PRIVATE HEALTH INSURANCE | Admitting: Internal Medicine

## 2014-03-03 ENCOUNTER — Encounter: Payer: Self-pay | Admitting: Internal Medicine

## 2014-03-03 VITALS — BP 101/67 | HR 83 | Temp 97.5°F | Ht 62.0 in | Wt 204.8 lb

## 2014-03-03 DIAGNOSIS — E1149 Type 2 diabetes mellitus with other diabetic neurological complication: Secondary | ICD-10-CM

## 2014-03-03 DIAGNOSIS — G8929 Other chronic pain: Secondary | ICD-10-CM

## 2014-03-03 DIAGNOSIS — F331 Major depressive disorder, recurrent, moderate: Secondary | ICD-10-CM

## 2014-03-03 DIAGNOSIS — IMO0002 Reserved for concepts with insufficient information to code with codable children: Secondary | ICD-10-CM

## 2014-03-03 DIAGNOSIS — E1165 Type 2 diabetes mellitus with hyperglycemia: Secondary | ICD-10-CM

## 2014-03-03 DIAGNOSIS — I10 Essential (primary) hypertension: Secondary | ICD-10-CM

## 2014-03-03 DIAGNOSIS — E785 Hyperlipidemia, unspecified: Secondary | ICD-10-CM

## 2014-03-03 DIAGNOSIS — M549 Dorsalgia, unspecified: Secondary | ICD-10-CM

## 2014-03-03 DIAGNOSIS — F203 Undifferentiated schizophrenia: Secondary | ICD-10-CM

## 2014-03-03 LAB — GLUCOSE, CAPILLARY: Glucose-Capillary: 280 mg/dL — ABNORMAL HIGH (ref 70–99)

## 2014-03-03 MED ORDER — GABAPENTIN 300 MG PO CAPS: 300.0000 mg | ORAL_CAPSULE | Freq: Three times a day (TID) | ORAL | Status: DC

## 2014-03-03 MED ORDER — GEMFIBROZIL 600 MG PO TABS: 600.0000 mg | ORAL_TABLET | Freq: Every day | ORAL | Status: AC

## 2014-03-03 MED ORDER — TOPIRAMATE 50 MG PO TABS
50.0000 mg | ORAL_TABLET | Freq: Two times a day (BID) | ORAL | Status: AC
Start: 2014-03-03 — End: ?

## 2014-03-03 MED ORDER — SERTRALINE HCL 100 MG PO TABS: 100.0000 mg | ORAL_TABLET | Freq: Every day | ORAL | Status: AC

## 2014-03-03 MED ORDER — DIVALPROEX SODIUM 500 MG PO DR TAB: 500.0000 mg | DELAYED_RELEASE_TABLET | Freq: Every day | ORAL | Status: AC

## 2014-03-03 NOTE — Assessment & Plan Note (Signed)
Lab Results  Component Value Date   HGBA1C 13.1* 03/02/2014   HGBA1C 14.8* 12/07/2013   HGBA1C 15.5* 09/03/2013     Assessment: Diabetes control: poor control (HgbA1C >9%) Progress toward A1C goal:  unchanged Comments: was seen by endocrinology recently. On NovoLog 20 units 3 times a day and Lantus 40 units at bedtime. Does not have her glucose meter today.  Plan: Medications:  continue current medications Home glucose monitoring: Frequency: 4 times a day Timing: before breakfast;before lunch;before dinner Instruction/counseling given: reminded to bring blood glucose meter & log to each visit Educational resources provided: brochure Self management tools provided: copy of home glucose meter download Other plans: Followup with endocrinology.

## 2014-03-03 NOTE — Progress Notes (Signed)
Patient ID: ZAMERIA VOGL, female   DOB: 09-11-1955, 58 y.o.   MRN: [id number]    Subjective:   HPI: Ms.Sruti T Colburn is a 58 y.o. woman with past medical history as shown below presents for routine visit for medication refill.  Reason(s) for this visit: 1. Refill of some of her medications. No complaints today.  Kindly see the A&P for the status of the pt's chronic medical problems.   Past Medical History  Diagnosis Date  . Glaucoma     dx 06/2011-[facility]  . Hypertension   . Hyperlipidemia   . History of TIA (transient ischemic attack) 12/2010-01/2011 X 2  . Pancreatitis   . Gout   . Depression   . Chronic pain   . Bipolar disorder   . Gastritis   . COPD (chronic obstructive pulmonary disease)   . Schizophrenia   . 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 Fatty liver     mild CT 06/2012  . Stroke     TIAs  . Esophageal ring, acquired 08/12/2013  . Cocaine abuse   . Chronic abdominal pain   . Asthma   . Hypothyroidism   . Type II diabetes mellitus dx'd ` 2009  . DKA (diabetic ketoacidoses) 07/21/2013  . Arthritis     "legs, lower back, left knee" (12/30/2013)  . Degenerative disc disease   . Chronic lower back pain   . Gout   . Anxiety   . CKD (chronic kidney disease), stage III    ROS: Constitutional: Denies fever, chills, diaphoresis, appetite change and fatigue.  Respiratory: Denies SOB, DOE, cough, chest tightness, and wheezing. Denies chest pain. CVS: No chest pain, palpitations and leg swelling.  GI: No abdominal pain, nausea, vomiting, bloody stools GU: No dysuria, frequency, hematuria, or flank pain.  MSK: chronic pain. On tramadol. She requests me to increase her current dose. Deferred at this PCP. Psych: Depression and anxiety. No SI.  Requests for refill of her Xanax. Defer this to her PCP.  Objective:  Physical Exam: Filed Vitals:   03/03/14 0924 03/03/14 1000 03/03/14 1005 03/03/14 1008  BP: 93/60 114/59 90/55 101/67  Pulse: 74 72  74 83  Temp: 97.5 F (36.4 C)     TempSrc: Oral     Height: 5\' 2"  (1.575 m)     Weight: 204 lb 12.8 oz (92.897 kg)     SpO2: 98% 98% 98% 96%   General: Well nourished. No acute distress.  HEENT: Normal oral mucosa. MMM.  Lungs: CTA bilaterally. Heart: RRR; no extra sounds or murmurs  Abdomen: Non-distended, normal bowel sounds, soft, nontender; no hepatosplenomegaly  Extremities: No pedal edema. No joint swelling or tenderness. Neurologic: Normal EOM,  Alert and oriented x3. No obvious neurologic/cranial nerve deficits.  Assessment & Plan:  Discussed case with my attending in the clinic, Dr. Dareen Piano See problem based charting.

## 2014-03-03 NOTE — Assessment & Plan Note (Addendum)
Symptoms are stable. No suicidal ideations. Refilled Zoloft. I did not give a prescription of Xanax. Defered this to her PCP.

## 2014-03-03 NOTE — Assessment & Plan Note (Signed)
Patient reports that her current dose of Tramadol is not effective for pain. She takes tramadol 50 mg every 6 hours when necessary. She requests for an increased in the dose. Plan. -Explained to the patient that I will defer are chronic pain management to her PCP. I will send an Epic message to the PCP. She recently received refills of her tramadol.

## 2014-03-03 NOTE — Assessment & Plan Note (Signed)
Symptoms are stable. Refilled Topamax.

## 2014-03-03 NOTE — Patient Instructions (Signed)
General Instructions: Please stop taking Amlodipine because your blood pressure is low  I will send a message to Dr Posey Pronto for your tramadol and Xanax Please come back in one month.  Please bring your medicines with you each time you come to clinic.  Medicines may include prescription medications, over-the-counter medications, herbal remedies, eye drops, vitamins, or other pills.   Progress Toward Treatment Goals:  Treatment Goal 03/03/2014  Hemoglobin A1C unchanged  Blood pressure at goal  Stop smoking smoking less  Prevent falls -    Self Care Goals & Plans:  Self Care Goal 03/03/2014  Manage my medications take my medicines as prescribed; bring my medications to every visit; refill my medications on time  Monitor my health -  Eat healthy foods drink diet soda or water instead of juice or soda; eat more vegetables; eat foods that are low in salt; eat baked foods instead of fried foods; eat fruit for snacks and desserts  Be physically active -  Stop smoking -  Prevent falls -  Meeting treatment goals -    Home Blood Glucose Monitoring 03/03/2014  Check my blood sugar 4 times a day  When to check my blood sugar before breakfast; before lunch; before dinner     Care Management & Community Referrals:  Referral 03/03/2014  Referrals made for care management support none needed  Referrals made to community resources -

## 2014-03-03 NOTE — Assessment & Plan Note (Signed)
BP Readings from Last 3 Encounters:  03/03/14 101/67  03/02/14 118/60  02/08/14 124/57  No data found.  Orthostatic Vital Signs:  Blood Pressure Heart Rate  Laying  114/59 mmHg  72 bpm  Sitting   90/55 mmHg  74 bpm  Standing  101/67 mmHg  83 bpm    Lab Results  Component Value Date   NA 141 01/03/2014   K 4.2 01/03/2014   CREATININE 1.10 01/03/2014    Assessment: Blood pressure control: controlled Progress toward BP goal:  at goal Comments: Patient on amlodipine 5 mg daily, and lisinopril 40 mg daily.  Noted to have low blood pressure without orthostatic hypotension. She is asymptomatic.  By mouth intake has been stable.  Plan: Medications:  Discontinue amlodipine. Continue with lisinopril 40 mg daily. Educational resources provided: brochure Self management tools provided:   Other plans: Reevaluate blood pressure to followup and determine if she needs to be kept on amlodipine.

## 2014-03-04 NOTE — Progress Notes (Signed)
INTERNAL MEDICINE TEACHING ATTENDING ADDENDUM - Arhum Peeples, MD: I reviewed and discussed at the time of visit with the resident Dr. Kazibwe, the patient's medical history, physical examination, diagnosis and results of pertinent tests and treatment and I agree with the patient's care as documented.  

## 2014-03-09 NOTE — Addendum Note (Signed)
Addended by: Bethena Roys on: 03/09/2014 01:27 PM   Modules accepted: Orders

## 2014-03-11 ENCOUNTER — Encounter: Payer: Self-pay | Admitting: Internal Medicine

## 2014-03-11 NOTE — Progress Notes (Signed)
Patient ID: [name], female   DOB: [birth year], 58 y.o.   MRN: [id number]  I am completing paperwork today from [facility] that requires my signature:  Received 03/01/14: It appears similar to the ones I have received before.  Several adverse medication effects were identified consistent with the ones documented on 9/15.  RN to check CBGs at least weekly with report to MD should they be elevated  Check A1c unless one has been documented in past 90 days

## 2014-03-16 ENCOUNTER — Ambulatory Visit: Payer: Self-pay | Admitting: Endocrinology

## 2014-03-16 NOTE — Progress Notes (Signed)
This encounter was created in error - please disregard.

## 2014-03-24 ENCOUNTER — Encounter: Payer: Self-pay | Admitting: Internal Medicine

## 2014-03-24 NOTE — Progress Notes (Signed)
Patient ID: [name], female   DOB: May 22, 1956, 58 y.o.   MRN: [id number]  I am completing paperwork today from [facility] that requires my signature:  Received 03/24/14: [facility] orders related to her most recent hospitalization

## 2014-04-08 ENCOUNTER — Telehealth: Payer: Self-pay | Admitting: *Deleted

## 2014-04-08 NOTE — Telephone Encounter (Signed)
[name] with [name] called - needs an order for recert. Last appt will be 04/12/14. [name] states pt has appt with [facility] 04/09/14. [name] RN 04/08/14 9:25AM

## 2014-04-09 ENCOUNTER — Ambulatory Visit (INDEPENDENT_AMBULATORY_CARE_PROVIDER_SITE_OTHER): Payer: PRIVATE HEALTH INSURANCE | Admitting: Internal Medicine

## 2014-04-09 ENCOUNTER — Encounter: Payer: Self-pay | Admitting: Internal Medicine

## 2014-04-09 VITALS — BP 138/82 | HR 67 | Temp 97.5°F | Ht 62.0 in | Wt 208.3 lb

## 2014-04-09 DIAGNOSIS — E1165 Type 2 diabetes mellitus with hyperglycemia: Secondary | ICD-10-CM

## 2014-04-09 DIAGNOSIS — F418 Other specified anxiety disorders: Secondary | ICD-10-CM

## 2014-04-09 DIAGNOSIS — E1149 Type 2 diabetes mellitus with other diabetic neurological complication: Secondary | ICD-10-CM

## 2014-04-09 DIAGNOSIS — M549 Dorsalgia, unspecified: Secondary | ICD-10-CM

## 2014-04-09 DIAGNOSIS — Z91419 Personal history of unspecified adult abuse: Secondary | ICD-10-CM

## 2014-04-09 DIAGNOSIS — I10 Essential (primary) hypertension: Secondary | ICD-10-CM

## 2014-04-09 DIAGNOSIS — E1140 Type 2 diabetes mellitus with diabetic neuropathy, unspecified: Secondary | ICD-10-CM

## 2014-04-09 DIAGNOSIS — Z23 Encounter for immunization: Secondary | ICD-10-CM

## 2014-04-09 DIAGNOSIS — IMO0002 Reserved for concepts with insufficient information to code with codable children: Secondary | ICD-10-CM

## 2014-04-09 DIAGNOSIS — Z794 Long term (current) use of insulin: Secondary | ICD-10-CM

## 2014-04-09 DIAGNOSIS — G8929 Other chronic pain: Secondary | ICD-10-CM

## 2014-04-09 LAB — GLUCOSE, CAPILLARY: Glucose-Capillary: 87 mg/dL (ref 70–99)

## 2014-04-09 MED ORDER — TRAMADOL HCL 50 MG PO TABS: 50.0000 mg | ORAL_TABLET | Freq: Four times a day (QID) | ORAL | Status: AC | PRN

## 2014-04-09 NOTE — Progress Notes (Signed)
   Subjective:    Patient ID: [name], female    DOB: [birth year], 58 y.o.   MRN: [id number]  HPI [name] is a 58 year old female with HTN, COPD, poorly controlled DM2, depression/anxiety, bipolar disorder, chronic pain, h/o polysubstance abuse (cocaine & benzos) who presents today for a follow-up visit.   Please see assessment & plan for documentation of each problem.  Review of Systems  Constitutional: Negative for fever and appetite change.  Gastrointestinal: Negative for abdominal pain.  Musculoskeletal: Positive for back pain and arthralgias.       Chronic but stable  Psychiatric/Behavioral: Negative for suicidal ideas and self-injury. The patient is nervous/anxious.        Objective:   Physical Exam Constitutional: She is oriented to person, place, and time. She appears well-developed and well-nourished. No distress.  HENT:  Head: Normocephalic and atraumatic.  Eyes: Conjunctivae are normal. Pupils are equal, round, and reactive to light.  Cardiovascular: Normal rate, regular rhythm and normal heart sounds.  Exam reveals no gallop and no friction rub.   No murmur heard. Pulmonary/Chest: Effort normal. No respiratory distress. She has no wheezes. She has no rales.  Abdominal: Soft. Bowel sounds are normal. She exhibits no distension. There is no tenderness.  Neurological: She is alert and oriented to person, place, and time. No cranial nerve deficit. Coordination normal.  Skin: Skin is warm and dry. She is not diaphoretic.  Psychiatric: Her behavior is normal.         Assessment & Plan:

## 2014-04-09 NOTE — Addendum Note (Signed)
Addended by: Riccardo Dubin on: 04/09/2014 06:05 PM   Modules accepted: Level of Service

## 2014-04-09 NOTE — Assessment & Plan Note (Signed)
  BP Readings from Last 3 Encounters:  04/09/14 138/82  03/03/14 101/67  03/02/14 118/60    Lab Results  Component Value Date   NA 141 01/03/2014   K 4.2 01/03/2014   CREATININE 1.10 01/03/2014    Assessment: Blood pressure control: controlled Progress toward BP goal:  at goal Comments: No symptoms to report.  Plan: Medications:  amlodipine 5mg , lisinopril 40mg  Educational resources provided: brochure Self management tools provided:   Other plans: Stable for now. Continue current regimen.

## 2014-04-09 NOTE — Assessment & Plan Note (Signed)
Overview She would also like a refill on her Xanax as she notes chest pain 2/2 anxiety at night that keeps her from sleeping. She has taken nitroglycerin though doesn't help. She denies any associated dyspnea and reports some stress this past month.  Assessment -Already on a number of psychiatric medications for which benzos have been flagged by [facility] -Defer Xanax to her psychiatrist, Dr. Howell Rucks, per his request

## 2014-04-09 NOTE — Telephone Encounter (Signed)
You can give my verbal order based off our office visit. Her sugars look better but needs to check them more consistently - not just 7 days prior to appointment.

## 2014-04-09 NOTE — Assessment & Plan Note (Addendum)
Overview She reports pain in the back, lower legs, and knees. Pain is bad in the morning to the point where she can't get up. She feels like it's worsening and was tempted to take her husband's oxycodone pills. She is currently on tramadol 50mg  q6h prn though she feels like she has to do 100mg  q4h prn for pain. She has tried a Sports coach but did not help her at all. She reports getting oxycodone from the clinic before though was penalized after she took more than she should have. She is very much interested in going to a pain clinic where they count pills and check her urine now that she is past her history of drug abuse, including cocaine, which she reports as "not having used in awhile."  Per [facility], she has not filled Tramadol since 7/31, and Dr. Alice Rieger deferred refilling it at her last visit in October.    Assessment -Moderately high risk of substance abuse given her prior history and co-morbid psychiatric problems -Appears sincere in wanting to show improvement -Pain clinic referral pending from Dr. Shela Commons visit back in September  Plan -Check UDS today -Refill Tramadol 50mg  q6h for 15 tabs but consider 100mg  q6hr if UDS clean -Follow-up referral to pain clinic that Dr. Arcelia Jew started back in September

## 2014-04-09 NOTE — Assessment & Plan Note (Signed)
Overview: She was due to follow-up 2 weeks after her visit to Dr. Loanne Drilling (Endocrinology) in October but reports not knowing to follow-up. She has her glucometer with me which only shows readings over the past week mainly in the 200s. She reports misplacing her meter last time but has brought it back today and feels that she is making better choices with food. She brought her insulin pens today to show me that she has been taking them. Current regimen is Novolog 20 units TID and Lantus 40u QHS.   Lab Results  Component Value Date   HGBA1C 13.1* 03/02/2014   HGBA1C 14.8* 12/07/2013   HGBA1C 15.5* 09/03/2013     Assessment: Diabetes control: poor control (HgbA1C >9%) Progress toward A1C goal:  unchanged Comments:  -Progress appears promising though CBGs are limited to past week -Needs to continue follow-up with Endocrinology  Plan: Medications:  Lantus 40u QHS; Novolog 20u TID Home glucose monitoring: Frequency: 2 times a day Timing: before breakfast, at bedtime Instruction/counseling given: discussed the need for weight loss and discussed diet Educational resources provided: brochure Self management tools provided: copy of home glucose meter download, home glucose logbook Other plans:  -Recommended follow-up with Endocrinology within this month -Counseled her on diet as much as she can tolerate

## 2014-04-09 NOTE — Assessment & Plan Note (Signed)
Overview She denies feeling unsafe or at risk for current violence.  Assessment -Stable for now but continue assessing  Plan -Encourage her to reach out to the clinic should she ever feel unsafe

## 2014-04-09 NOTE — Patient Instructions (Addendum)
Thank you for coming to clinic today and getting your flu shoot :)   After we get your urine screen, I will call in for the new prescription of Tramadol.   Please make sure you follow-up with your endocrinologist (Dr. Renato Shin) and your psychiatrist.   Please bring your medicines with you each time you come to clinic.  Medicines may include prescription medications, over-the-counter medications, herbal remedies, eye drops, vitamins, or other pills.

## 2014-04-10 LAB — PRESCRIPTION ABUSE MONITORING 15P, URINE
Amphetamine/Meth: NEGATIVE ng/mL
Barbiturate Screen, Urine: NEGATIVE ng/mL
Benzodiazepine Screen, Urine: NEGATIVE ng/mL
Buprenorphine, Urine: NEGATIVE ng/mL
CANNABINOID SCRN UR: NEGATIVE ng/mL
CARISOPRODOL, URINE: NEGATIVE ng/mL
Cocaine Metabolites: NEGATIVE ng/mL
Creatinine, Urine: 120.25 mg/dL (ref >20.0)
FENTANYL URINE: NEGATIVE ng/mL
MEPERIDINE UR: NEGATIVE ng/mL
METHADONE SCREEN, URINE: NEGATIVE ng/mL
OPIATE SCREEN, URINE: NEGATIVE ng/mL
Oxycodone Screen, Ur: NEGATIVE ng/mL
PROPOXYPHENE: NEGATIVE ng/mL
Tramadol Scrn, Ur: NEGATIVE ng/mL
Zolpidem, Urine: NEGATIVE ng/mL

## 2014-04-12 ENCOUNTER — Other Ambulatory Visit: Payer: Self-pay | Admitting: Internal Medicine

## 2014-04-12 ENCOUNTER — Telehealth: Payer: Self-pay | Admitting: *Deleted

## 2014-04-12 NOTE — Telephone Encounter (Signed)
As this patient was seen by me on 11/13 and deemed necessary for their treatment as documented in the EHR, I will refill this prescription for Synthroid & gabapentin.

## 2014-04-12 NOTE — Telephone Encounter (Signed)
Pt calls and wamts a call back about her urine, please call her at [phone]

## 2014-04-12 NOTE — Telephone Encounter (Signed)
I spoke to her and reassured her that I would call in the prescription for tramadol 100mg  q6h prn for pain (240 tabs of 50mg  + 2 refills) and that she should not fill before 30 days.   She had not filled the Tramadol I gave her this past week at [facility] but at the [facility] where she last filled her Tramadol per [place] DEA database.

## 2014-04-12 NOTE — Telephone Encounter (Signed)
As this patient was seen by me on 11/13 and deemed necessary for their treatment as documented in the EHR, I will refill this prescription for allopurinol.

## 2014-04-13 NOTE — Progress Notes (Signed)
Internal Medicine Clinic Attending  I saw and evaluated the patient.  I personally confirmed the key portions of the history and exam documented by Dr. Patel and I reviewed pertinent patient test results.  The assessment, diagnosis, and plan were formulated together and I agree with the documentation in the resident's note.  

## 2014-04-14 NOTE — Telephone Encounter (Signed)
Received another call today from [place] with [facility].  She did not receive a return call and wants to re-certify so she can continue with diabetic and diet education. I gave the Verbal Order from Dr. Serita Grit note below. Nurse states pt is not compliant with diet, she will work on that and DM.

## 2014-04-15 ENCOUNTER — Ambulatory Visit: Payer: Self-pay | Admitting: Endocrinology

## 2014-04-19 ENCOUNTER — Ambulatory Visit: Payer: PRIVATE HEALTH INSURANCE | Admitting: Endocrinology

## 2014-04-19 NOTE — Progress Notes (Signed)
   Subjective:    Patient ID: [name], female    DOB: 08-25-1955, 58 y.o.   MRN: [id number]  HPI       Review of Systems     Objective:   Physical Exam        Assessment & Plan:

## 2014-04-20 ENCOUNTER — Telehealth: Payer: Self-pay | Admitting: Endocrinology

## 2014-04-20 NOTE — Telephone Encounter (Signed)
No Show letter sent to pt.

## 2014-04-20 NOTE — Telephone Encounter (Signed)
Follow up advised. Contact patient and schedule visit next available.

## 2014-04-20 NOTE — Telephone Encounter (Signed)
Patient no showed today's appt. Please advise on how to follow up. °A. No follow up necessary. °B. Follow up urgent. Contact patient immediately. °C. Follow up necessary. Contact patient and schedule visit in ___ days. °D. Follow up advised. Contact patient and schedule visit in ____weeks. ° °

## 2014-04-21 IMAGING — CT CT ANGIO NECK
2 of 5 series · 7 of 27 positions shown · IV contrast (OMNIPAQUE 300)
Comparison: CT head without contrast 08/17/2013

CLINICAL DATA: Right leg weakness. Blurred vision in the left eye.
Dizziness. Question aneurysm.

EXAM:
CT ANGIOGRAPHY HEAD AND NECK
TECHNIQUE: Multidetector CT imaging of the head and neck was performed using
the standard protocol during bolus administration of intravenous
contrast. Multiplanar CT image reconstructions and MIPs were
obtained to evaluate the vascular anatomy. Carotid stenosis
measurements (when applicable) are obtained utilizing NASCET
criteria, using the distal internal carotid diameter as the
denominator.
CONTRAST:  100mL OMNIPAQUE IOHEXOL 350 MG/ML SOLN

[Series 6: axial thin · axial · 0.39mm/px · z∈[-305,-92]mm · 6 of 308 slices shown]
[im 44/308  soft-tissue]
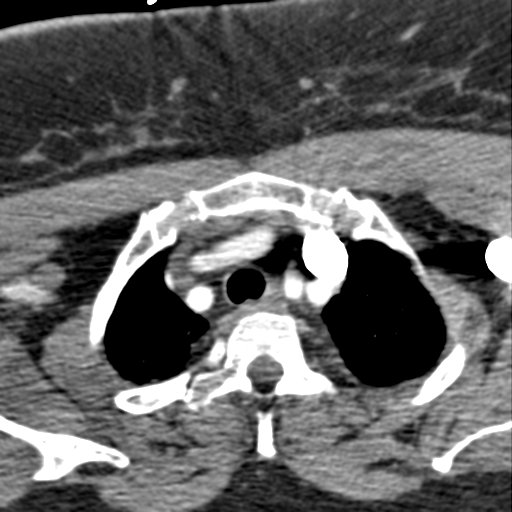
[im 88/308  bone]
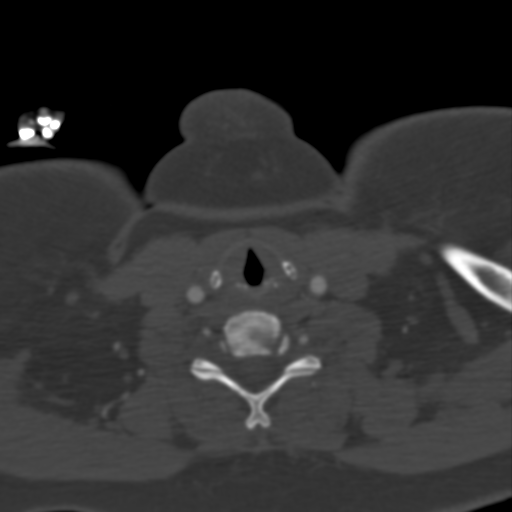
[im 132/308  soft-tissue]
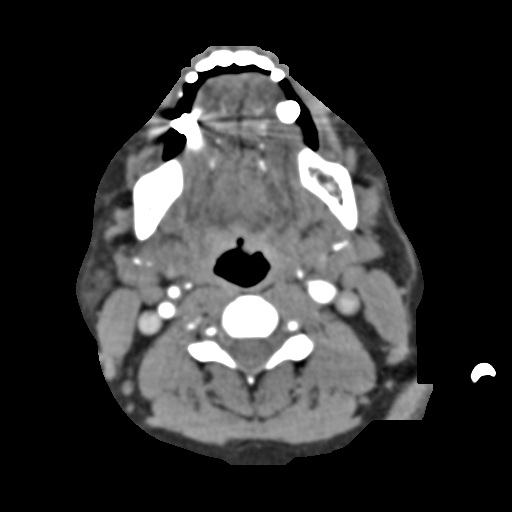
[im 176/308  bone]
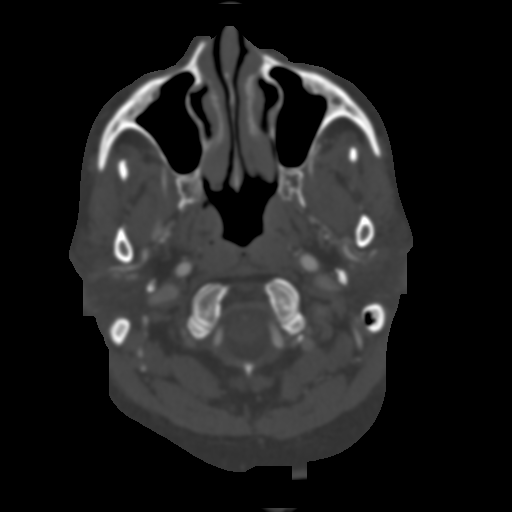
[im 220/308  soft-tissue]
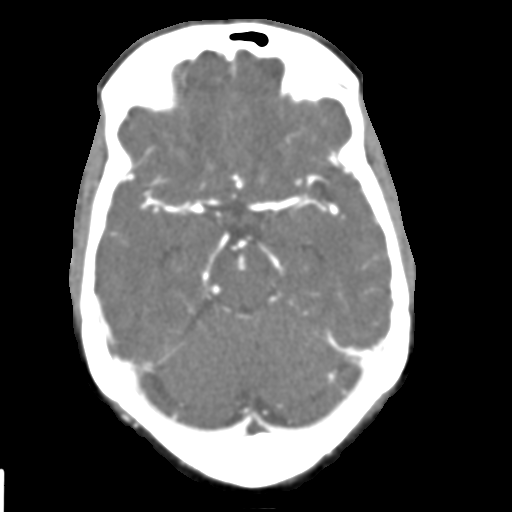
[im 264/308  bone]
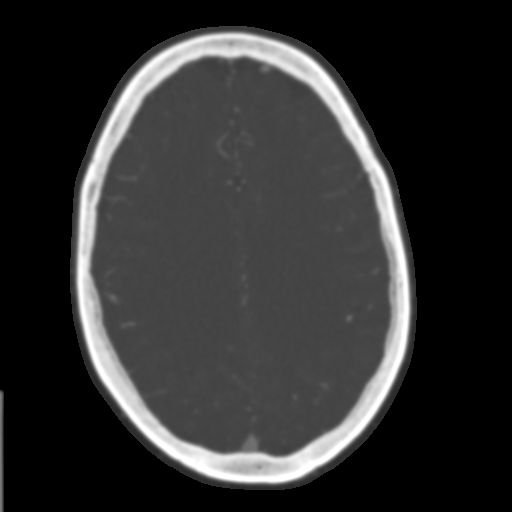

[Series 11: sagittal thick 20 x 5 · sagittal · 0.48mm/px · 1 of 32 slices shown]
[im 16/32  soft-tissue]
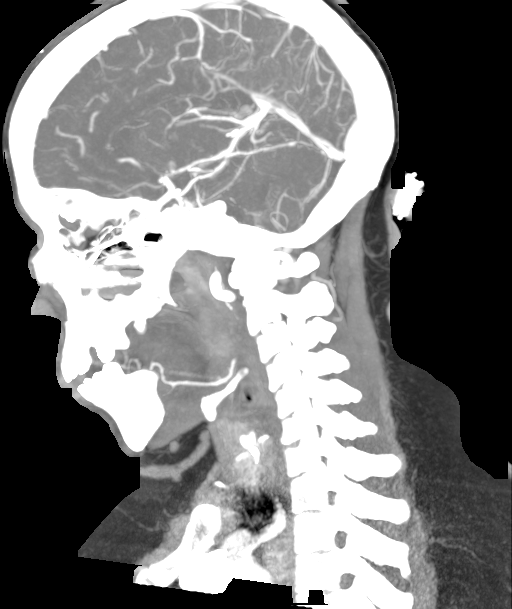

[7 of 27 positions shown; findings below may reference images not displayed]

FINDINGS: CTA HEAD FINDINGS

Mild white matter changes are stable. No acute cortical infarct,
hemorrhage, or mass lesion is present. The ventricles are of normal
size. No significant extra-axial fluid collection is present.

The paranasal sinuses and mastoid air cells are clear. A remote left
orbital blowout fracture is again seen.

The postcontrast images demonstrate no pathologic enhancement.

Intracranial internal carotid arteries demonstrate mild
atherosclerotic irregularity within the cavernous segments
bilaterally without significant stenosis. The ICA terminus is within
normal limits bilaterally. The left A1 segment is dominant to the
right. The anterior communicating artery is patent. The M1 segments
and MCA bifurcations are intact bilaterally. The MCA branch vessels
are within normal limits bilaterally.

The left vertebral artery is the dominant vessel. The right
vertebral artery essentially terminates at the PICA ascending a very
small branch to the vertebrobasilar junction. The basilar artery is
small with fetal type posterior cerebral arteries bilaterally. A
small left P1 segment is present.

The PCA branch vessels are within normal limits.

Review of the MIP images confirms the above findings.

CTA NECK FINDINGS

A standard 3 vessel arch configuration is present. The vertebral
arteries both originate from the subclavian arteries. The left
vertebral artery is dominant. The origin of the left vertebral
artery is obscured by an adjacent vein. There is no significant
stenosis within the vertebral arteries of the neck.

The right common carotid artery is within normal limits. There is a
focal calcification at the right carotid bifurcation without
significant stenosis of the internal carotid artery relative to the
distal vessel. The cervical ICA is otherwise normal.

The left common carotid artery is within normal limits. The
bifurcation is unremarkable. The cervical left ICA is mildly
tortuous without focal stenosis.

The soft tissues of the neck are unremarkable. The salivary glands
are intact. The lung apices demonstrate pleural blebs and mild
atelectasis.

Review of the MIP images confirms the above findings.
IMPRESSION: 1. No evidence for intracranial aneurysm.
2. Scattered white matter hypoattenuation is again noted without
significant change. The finding is nonspecific but can be seen in
the setting of chronic microvascular ischemia, a demyelinating
process such as multiple sclerosis, vasculitis, complicated migraine
headaches, or as the sequelae of a prior infectious or inflammatory
process.
3. Minimal atherosclerotic changes are present at the left carotid
bifurcation and bilateral cavernous carotid arteries without
significant stenosis.

## 2014-04-26 ENCOUNTER — Encounter: Payer: Self-pay | Admitting: *Deleted

## 2014-04-26 ENCOUNTER — Ambulatory Visit: Payer: Medicare Other | Admitting: Adult Health

## 2014-04-26 ENCOUNTER — Telehealth: Payer: Self-pay | Admitting: Adult Health

## 2014-04-26 NOTE — Telephone Encounter (Signed)
Patient no showed for a revisit appointment.  

## 2014-05-07 ENCOUNTER — Encounter: Payer: Self-pay | Admitting: Internal Medicine

## 2014-05-07 NOTE — Progress Notes (Signed)
Patient ID: [name], female   DOB: Nov 21, 1955, 58 y.o.   MRN: [id number]  I am completing paperwork today from [facility] that requires my signature:  Received 20/0/37: recertification of [facility] orders  Adverse medication effects: ASA & Zoloft Butrans & Xanax Butrans & Tramadol Depakote Gemfibrozil & colchicine Zoloft & formoterol Tramadol & tapendatol, Zofran Trazodone & SLT tricyclics (?)  I suspect some of these effects are being flagged for meds she was once prescribed but is no longer taking.   Per [facility], she has not filled any Butrans patches since 8/1 or Xanax since 9/4.   I do not see any significant differences otherwise on the order set.

## 2014-06-18 ENCOUNTER — Encounter: Payer: Self-pay | Admitting: Internal Medicine

## 2014-07-14 ENCOUNTER — Telehealth: Payer: Self-pay | Admitting: Internal Medicine

## 2014-07-14 NOTE — Telephone Encounter (Addendum)
Call to patient to confirm appointment for 07/15/14 at 1:15 unable to leave message

## 2014-07-15 ENCOUNTER — Encounter: Payer: Self-pay | Admitting: Internal Medicine

## 2014-08-10 ENCOUNTER — Encounter: Payer: Self-pay | Admitting: Pharmacist

## 2014-08-10 DIAGNOSIS — E785 Hyperlipidemia, unspecified: Secondary | ICD-10-CM

## 2014-08-10 DIAGNOSIS — I10 Essential (primary) hypertension: Secondary | ICD-10-CM

## 2014-09-03 IMAGING — CR DG NASAL BONES 3+V
3 series · 3 of 3 positions shown · non-contrast
Comparison: None.

CLINICAL DATA: Blunt trauma to face

EXAM:
NASAL BONES - 3+ VIEW

[w waters]
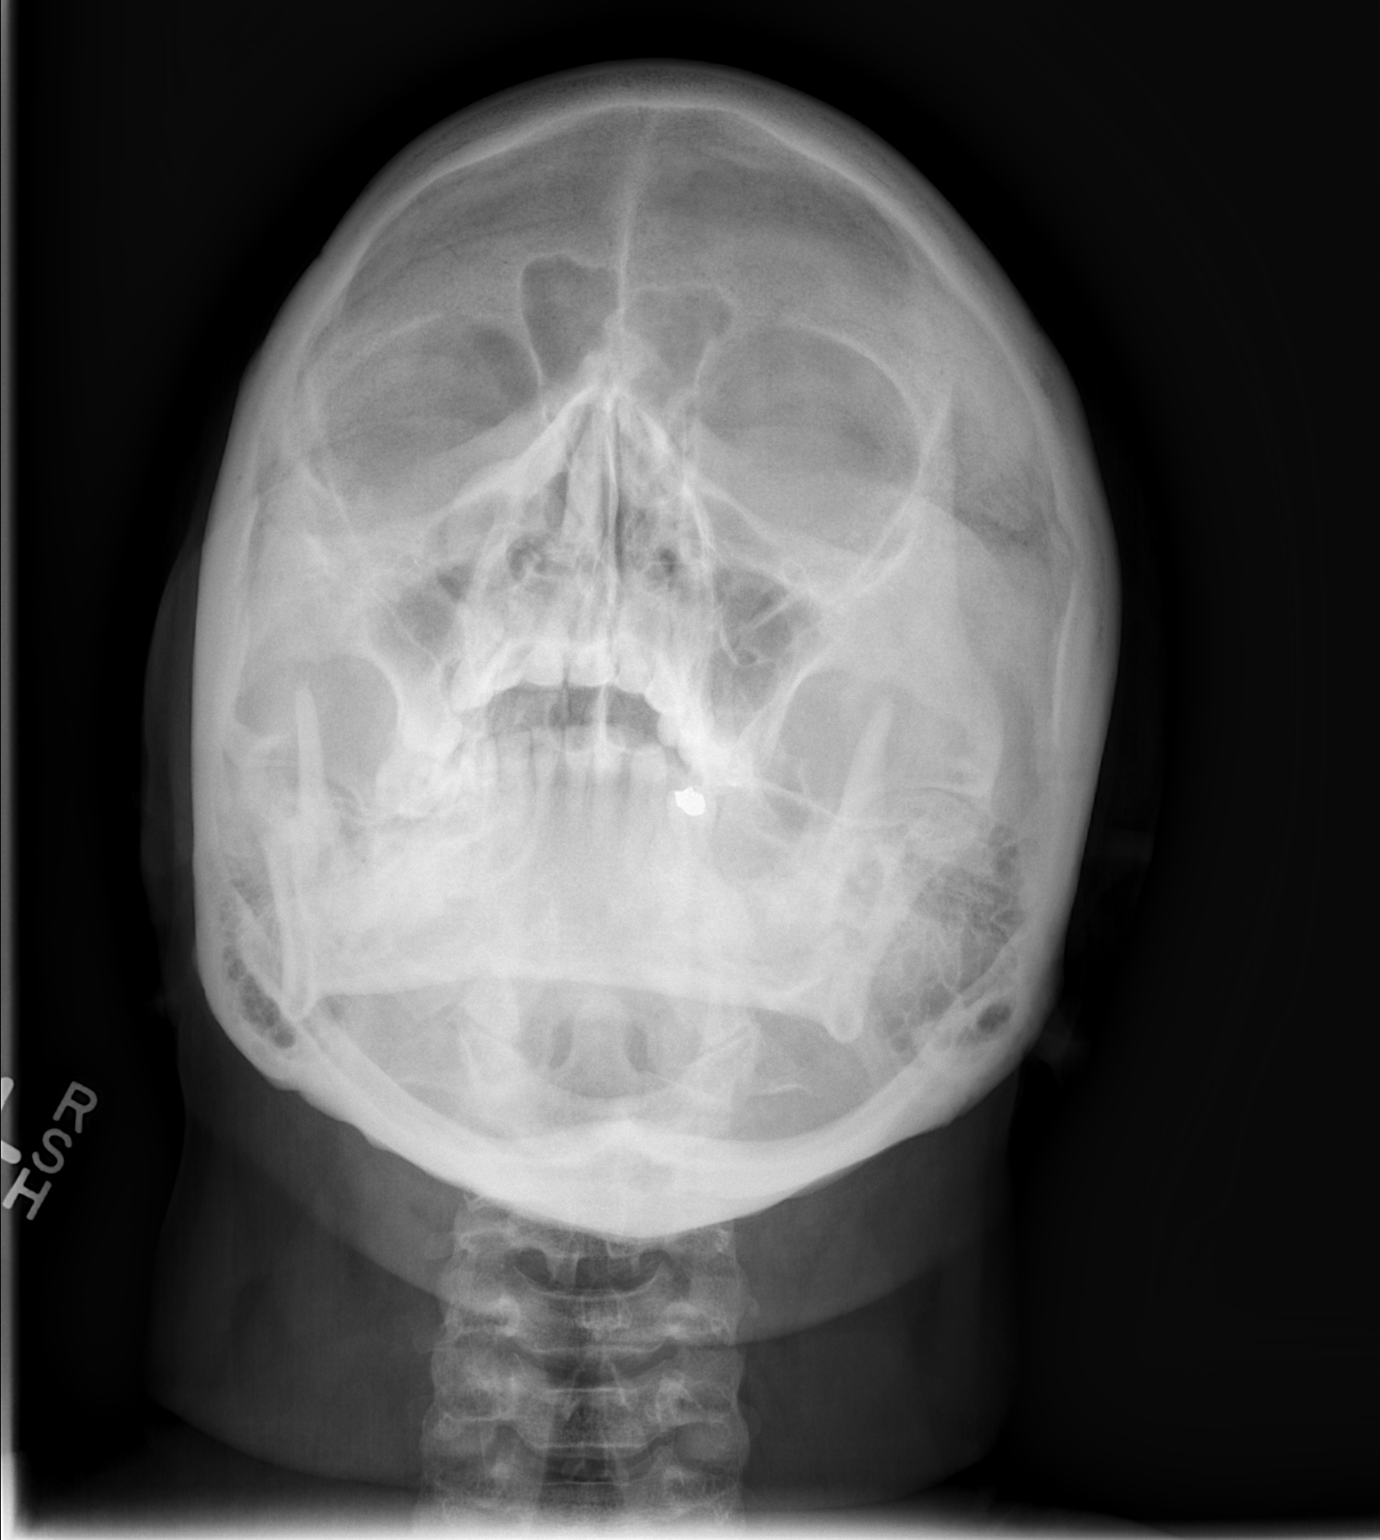

[w nasal bone lat * (1 of 2)]
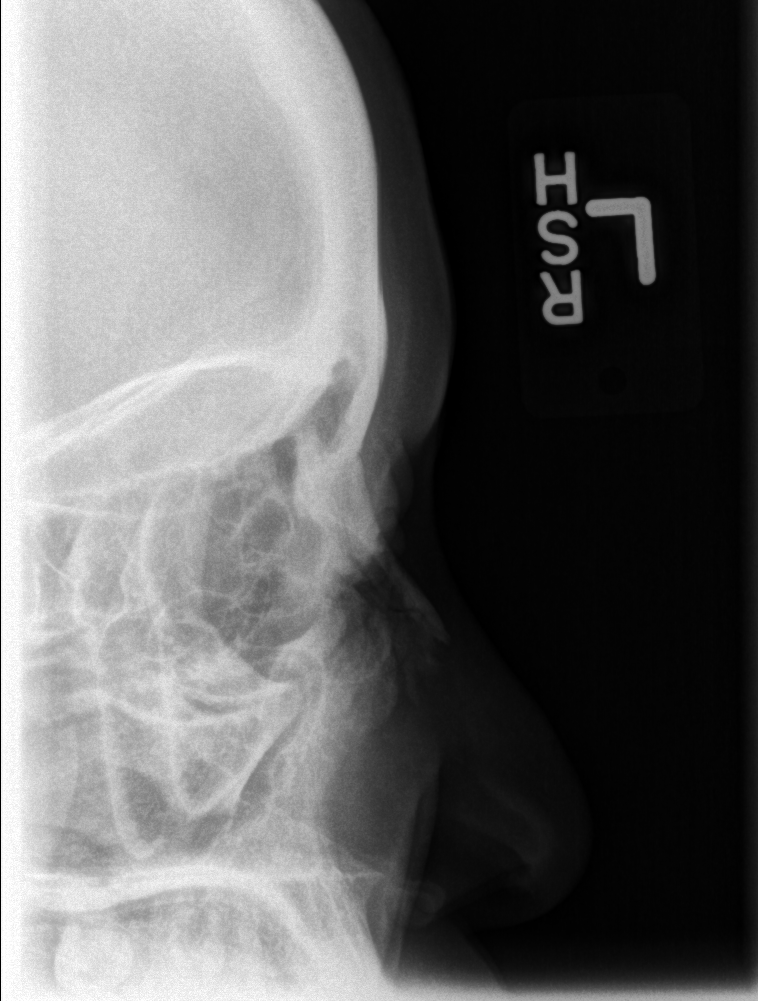

[w nasal bone lat * (2 of 2)]
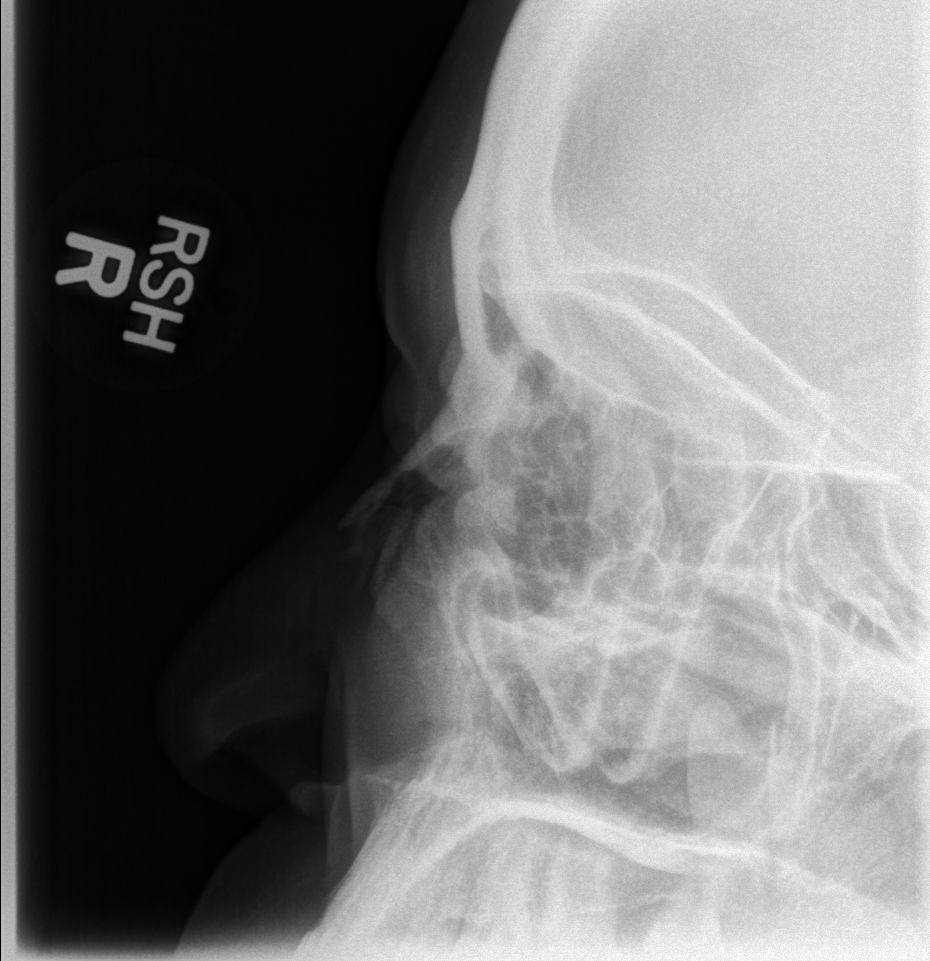

[3 of 3 positions shown; findings below may reference images not displayed]

FINDINGS: No displaced nasal bone fracture. No fluid the paranasal sinuses.
Orbits are intact.
IMPRESSION: No plain film radiographic evidence of nasal bone fracture.

## 2014-11-19 ENCOUNTER — Encounter: Payer: Self-pay | Admitting: Internal Medicine

## 2014-11-19 NOTE — Progress Notes (Signed)
Patient ID: [name], female   DOB: April 30, 1956, 59 y.o.   MRN: [id number]  I am completing paperwork today from [place] [facility] that requires my signature:  Received 11/16/14: Recommendation that she needs to be on statin therapy given her comorbid diabetes and hyperlipidemia  She has been lost to follow-up as she has not been seen by me in the last 6 months. Per conversations with [facility], it appears she has not filled any of her medication since 2015. We will attempt to reach out to her to schedule follow-up appointment.

## 2014-11-23 ENCOUNTER — Encounter: Payer: Self-pay | Admitting: Internal Medicine

## 2015-01-06 ENCOUNTER — Telehealth: Payer: Self-pay | Admitting: Internal Medicine

## 2015-01-06 NOTE — Telephone Encounter (Signed)
Attempted to contact patient today to make doctor's appt home telephone number [phone]) recording comes on stating person you have called is not available try back later.  The mobile number [phone])  Is not in service.  Patient was mailed letter back in June 2016, but no appt was made after mailing.  Will send patient a message via [facility].

## 2015-01-06 NOTE — Telephone Encounter (Signed)
Thank you for following this up. In the event we are unable to reach her, one other way to try and find an accessible number could be to check with her pharmacies. Please let me know either way.

## 2018-06-02 ENCOUNTER — Encounter: Payer: Self-pay | Admitting: Internal Medicine
